# Patient Record
Sex: Female | Born: 1935 | Race: White | Hispanic: No | Smoking: Never smoker
Health system: Southern US, Community
[De-identification: ages and names within clinical notes are randomized; demographics above are authoritative.]

## PROBLEM LIST (undated history)

## (undated) DIAGNOSIS — M545 Low back pain, unspecified: Secondary | ICD-10-CM

## (undated) DIAGNOSIS — I2510 Atherosclerotic heart disease of native coronary artery without angina pectoris: Secondary | ICD-10-CM

## (undated) DIAGNOSIS — G43909 Migraine, unspecified, not intractable, without status migrainosus: Secondary | ICD-10-CM

## (undated) DIAGNOSIS — S72453K Displaced supracondylar fracture without intracondylar extension of lower end of unspecified femur, subsequent encounter for closed fracture with nonunion: Secondary | ICD-10-CM

## (undated) DIAGNOSIS — I6529 Occlusion and stenosis of unspecified carotid artery: Secondary | ICD-10-CM

## (undated) DIAGNOSIS — F32A Depression, unspecified: Secondary | ICD-10-CM

## (undated) DIAGNOSIS — F0390 Unspecified dementia without behavioral disturbance: Secondary | ICD-10-CM

## (undated) DIAGNOSIS — I4891 Unspecified atrial fibrillation: Secondary | ICD-10-CM

## (undated) DIAGNOSIS — I255 Ischemic cardiomyopathy: Secondary | ICD-10-CM

## (undated) DIAGNOSIS — G8929 Other chronic pain: Secondary | ICD-10-CM

## (undated) DIAGNOSIS — M069 Rheumatoid arthritis, unspecified: Secondary | ICD-10-CM

## (undated) DIAGNOSIS — F419 Anxiety disorder, unspecified: Secondary | ICD-10-CM

## (undated) DIAGNOSIS — I10 Essential (primary) hypertension: Secondary | ICD-10-CM

## (undated) DIAGNOSIS — D649 Anemia, unspecified: Secondary | ICD-10-CM

## (undated) DIAGNOSIS — K922 Gastrointestinal hemorrhage, unspecified: Secondary | ICD-10-CM

## (undated) DIAGNOSIS — F329 Major depressive disorder, single episode, unspecified: Secondary | ICD-10-CM

## (undated) HISTORY — DX: Ischemic cardiomyopathy: I25.5

## (undated) HISTORY — DX: Occlusion and stenosis of unspecified carotid artery: I65.29

## (undated) HISTORY — DX: Atherosclerotic heart disease of native coronary artery without angina pectoris: I25.10

## (undated) HISTORY — PX: BACK SURGERY: SHX140

## (undated) HISTORY — PX: BREAST LUMPECTOMY: SHX2

## (undated) HISTORY — PX: JOINT REPLACEMENT: SHX530

---

## 1997-09-29 ENCOUNTER — Ambulatory Visit (HOSPITAL_COMMUNITY): Admission: RE | Admit: 1997-09-29 | Discharge: 1997-09-29 | Payer: Self-pay | Admitting: Family Medicine

## 1997-10-14 ENCOUNTER — Ambulatory Visit (HOSPITAL_COMMUNITY): Admission: RE | Admit: 1997-10-14 | Discharge: 1997-10-14 | Payer: Self-pay | Admitting: Family Medicine

## 1998-01-28 ENCOUNTER — Ambulatory Visit (HOSPITAL_COMMUNITY): Admission: RE | Admit: 1998-01-28 | Discharge: 1998-01-28 | Payer: Self-pay | Admitting: Family Medicine

## 1998-04-01 ENCOUNTER — Ambulatory Visit (HOSPITAL_COMMUNITY): Admission: RE | Admit: 1998-04-01 | Discharge: 1998-04-01 | Payer: Self-pay | Admitting: Orthopedic Surgery

## 1998-10-13 ENCOUNTER — Ambulatory Visit (HOSPITAL_COMMUNITY): Admission: RE | Admit: 1998-10-13 | Discharge: 1998-10-13 | Payer: Self-pay | Admitting: Family Medicine

## 1998-10-13 ENCOUNTER — Encounter: Payer: Self-pay | Admitting: Family Medicine

## 1998-12-17 ENCOUNTER — Other Ambulatory Visit: Admission: RE | Admit: 1998-12-17 | Discharge: 1998-12-17 | Payer: Self-pay | Admitting: Family Medicine

## 1999-10-29 ENCOUNTER — Encounter: Payer: Self-pay | Admitting: Family Medicine

## 1999-10-29 ENCOUNTER — Ambulatory Visit (HOSPITAL_COMMUNITY): Admission: RE | Admit: 1999-10-29 | Discharge: 1999-10-29 | Payer: Self-pay | Admitting: Family Medicine

## 2000-04-25 ENCOUNTER — Other Ambulatory Visit: Admission: RE | Admit: 2000-04-25 | Discharge: 2000-04-25 | Payer: Self-pay | Admitting: Family Medicine

## 2000-08-03 ENCOUNTER — Encounter: Payer: Self-pay | Admitting: Orthopedic Surgery

## 2000-08-03 ENCOUNTER — Ambulatory Visit (HOSPITAL_COMMUNITY): Admission: RE | Admit: 2000-08-03 | Discharge: 2000-08-03 | Payer: Self-pay | Admitting: Orthopedic Surgery

## 2000-11-29 ENCOUNTER — Other Ambulatory Visit: Admission: RE | Admit: 2000-11-29 | Discharge: 2000-11-29 | Payer: Self-pay | Admitting: Obstetrics and Gynecology

## 2000-12-07 ENCOUNTER — Ambulatory Visit (HOSPITAL_COMMUNITY): Admission: RE | Admit: 2000-12-07 | Discharge: 2000-12-07 | Payer: Self-pay | Admitting: Family Medicine

## 2000-12-07 ENCOUNTER — Encounter: Payer: Self-pay | Admitting: Family Medicine

## 2000-12-08 ENCOUNTER — Ambulatory Visit (HOSPITAL_COMMUNITY): Admission: RE | Admit: 2000-12-08 | Discharge: 2000-12-08 | Payer: Self-pay | Admitting: Obstetrics and Gynecology

## 2000-12-08 ENCOUNTER — Encounter: Payer: Self-pay | Admitting: Obstetrics and Gynecology

## 2001-01-27 HISTORY — PX: KNEE ARTHROSCOPY: SHX127

## 2001-02-06 ENCOUNTER — Ambulatory Visit (HOSPITAL_COMMUNITY): Admission: RE | Admit: 2001-02-06 | Discharge: 2001-02-06 | Payer: Self-pay | Admitting: Orthopedic Surgery

## 2001-05-03 ENCOUNTER — Encounter: Admission: RE | Admit: 2001-05-03 | Discharge: 2001-05-03 | Payer: Self-pay | Admitting: Rheumatology

## 2001-05-03 ENCOUNTER — Encounter: Payer: Self-pay | Admitting: Rheumatology

## 2002-01-01 ENCOUNTER — Encounter: Payer: Self-pay | Admitting: Rheumatology

## 2002-01-01 ENCOUNTER — Encounter: Admission: RE | Admit: 2002-01-01 | Discharge: 2002-01-01 | Payer: Self-pay | Admitting: Rheumatology

## 2002-01-27 HISTORY — PX: TOTAL KNEE ARTHROPLASTY: SHX125

## 2002-02-18 ENCOUNTER — Inpatient Hospital Stay (HOSPITAL_COMMUNITY): Admission: RE | Admit: 2002-02-18 | Discharge: 2002-02-21 | Payer: Self-pay | Admitting: Orthopedic Surgery

## 2002-08-06 ENCOUNTER — Encounter: Admission: RE | Admit: 2002-08-06 | Discharge: 2002-08-06 | Payer: Self-pay | Admitting: Rheumatology

## 2002-08-06 ENCOUNTER — Encounter: Payer: Self-pay | Admitting: Rheumatology

## 2002-09-02 ENCOUNTER — Encounter: Admission: RE | Admit: 2002-09-02 | Discharge: 2002-09-02 | Payer: Self-pay | Admitting: Neurosurgery

## 2002-09-02 ENCOUNTER — Encounter: Payer: Self-pay | Admitting: Neurosurgery

## 2002-09-16 ENCOUNTER — Encounter: Payer: Self-pay | Admitting: Neurosurgery

## 2002-09-16 ENCOUNTER — Encounter: Admission: RE | Admit: 2002-09-16 | Discharge: 2002-09-16 | Payer: Self-pay | Admitting: Neurosurgery

## 2002-09-29 HISTORY — PX: LUMBAR DISC SURGERY: SHX700

## 2002-10-21 ENCOUNTER — Inpatient Hospital Stay (HOSPITAL_COMMUNITY): Admission: RE | Admit: 2002-10-21 | Discharge: 2002-11-21 | Payer: Self-pay | Admitting: Neurosurgery

## 2002-10-21 ENCOUNTER — Encounter: Payer: Self-pay | Admitting: Neurosurgery

## 2002-10-28 HISTORY — PX: INCISION AND DRAINAGE OF WOUND: SHX1803

## 2002-10-28 HISTORY — PX: HEMATOMA EVACUATION: SHX5118

## 2002-10-30 ENCOUNTER — Encounter: Payer: Self-pay | Admitting: Neurosurgery

## 2002-11-03 ENCOUNTER — Encounter: Payer: Self-pay | Admitting: Neurosurgery

## 2002-11-07 ENCOUNTER — Encounter: Payer: Self-pay | Admitting: Neurosurgery

## 2002-11-13 ENCOUNTER — Encounter: Payer: Self-pay | Admitting: Neurosurgery

## 2002-11-22 ENCOUNTER — Encounter (HOSPITAL_COMMUNITY): Admission: RE | Admit: 2002-11-22 | Discharge: 2002-11-24 | Payer: Self-pay | Admitting: Neurosurgery

## 2002-11-23 ENCOUNTER — Inpatient Hospital Stay (HOSPITAL_COMMUNITY): Admission: AD | Admit: 2002-11-23 | Discharge: 2002-12-03 | Payer: Self-pay | Admitting: Neurosurgery

## 2003-05-13 ENCOUNTER — Encounter: Payer: Self-pay | Admitting: Geriatric Medicine

## 2003-05-13 ENCOUNTER — Ambulatory Visit (HOSPITAL_COMMUNITY): Admission: RE | Admit: 2003-05-13 | Discharge: 2003-05-13 | Payer: Self-pay | Admitting: Geriatric Medicine

## 2003-10-28 HISTORY — PX: LUMBAR LAMINECTOMY: SHX95

## 2003-11-03 ENCOUNTER — Emergency Department (HOSPITAL_COMMUNITY): Admission: EM | Admit: 2003-11-03 | Discharge: 2003-11-03 | Payer: Self-pay | Admitting: Emergency Medicine

## 2003-11-25 ENCOUNTER — Inpatient Hospital Stay (HOSPITAL_COMMUNITY): Admission: EM | Admit: 2003-11-25 | Discharge: 2003-12-05 | Payer: Self-pay | Admitting: Emergency Medicine

## 2003-12-02 ENCOUNTER — Encounter: Payer: Self-pay | Admitting: Neurosurgery

## 2003-12-05 ENCOUNTER — Inpatient Hospital Stay (HOSPITAL_COMMUNITY)
Admission: RE | Admit: 2003-12-05 | Discharge: 2003-12-18 | Payer: Self-pay | Admitting: Physical Medicine & Rehabilitation

## 2004-01-04 ENCOUNTER — Emergency Department (HOSPITAL_COMMUNITY): Admission: EM | Admit: 2004-01-04 | Discharge: 2004-01-04 | Payer: Self-pay | Admitting: Emergency Medicine

## 2004-01-08 ENCOUNTER — Ambulatory Visit (HOSPITAL_COMMUNITY): Admission: RE | Admit: 2004-01-08 | Discharge: 2004-01-08 | Payer: Self-pay | Admitting: Geriatric Medicine

## 2004-01-08 ENCOUNTER — Emergency Department (HOSPITAL_COMMUNITY): Admission: EM | Admit: 2004-01-08 | Discharge: 2004-01-08 | Payer: Self-pay | Admitting: Emergency Medicine

## 2004-01-21 ENCOUNTER — Encounter (HOSPITAL_COMMUNITY): Admission: RE | Admit: 2004-01-21 | Discharge: 2004-02-01 | Payer: Self-pay | Admitting: Geriatric Medicine

## 2004-01-26 ENCOUNTER — Encounter: Admission: RE | Admit: 2004-01-26 | Discharge: 2004-01-26 | Payer: Self-pay | Admitting: Infectious Diseases

## 2004-02-16 ENCOUNTER — Encounter: Admission: RE | Admit: 2004-02-16 | Discharge: 2004-02-16 | Payer: Self-pay | Admitting: Infectious Diseases

## 2004-05-31 ENCOUNTER — Ambulatory Visit: Payer: Self-pay | Admitting: Infectious Diseases

## 2004-09-28 ENCOUNTER — Encounter: Admission: RE | Admit: 2004-09-28 | Discharge: 2004-09-28 | Payer: Self-pay | Admitting: Rheumatology

## 2004-11-15 ENCOUNTER — Other Ambulatory Visit: Admission: RE | Admit: 2004-11-15 | Discharge: 2004-11-15 | Payer: Self-pay | Admitting: Geriatric Medicine

## 2004-12-02 ENCOUNTER — Ambulatory Visit (HOSPITAL_COMMUNITY): Admission: RE | Admit: 2004-12-02 | Discharge: 2004-12-02 | Payer: Self-pay | Admitting: Geriatric Medicine

## 2005-01-03 ENCOUNTER — Ambulatory Visit: Payer: Self-pay | Admitting: Infectious Diseases

## 2005-08-26 ENCOUNTER — Ambulatory Visit (HOSPITAL_COMMUNITY): Admission: RE | Admit: 2005-08-26 | Discharge: 2005-08-26 | Payer: Self-pay | Admitting: Emergency Medicine

## 2005-12-27 ENCOUNTER — Ambulatory Visit (HOSPITAL_COMMUNITY): Admission: RE | Admit: 2005-12-27 | Discharge: 2005-12-27 | Payer: Self-pay | Admitting: Geriatric Medicine

## 2007-12-17 ENCOUNTER — Encounter: Admission: RE | Admit: 2007-12-17 | Discharge: 2007-12-17 | Payer: Self-pay | Admitting: Geriatric Medicine

## 2008-01-01 ENCOUNTER — Ambulatory Visit (HOSPITAL_COMMUNITY): Admission: RE | Admit: 2008-01-01 | Discharge: 2008-01-01 | Payer: Self-pay | Admitting: Geriatric Medicine

## 2008-04-25 ENCOUNTER — Emergency Department (HOSPITAL_COMMUNITY): Admission: EM | Admit: 2008-04-25 | Discharge: 2008-04-25 | Payer: Self-pay | Admitting: Emergency Medicine

## 2008-10-02 ENCOUNTER — Encounter: Admission: RE | Admit: 2008-10-02 | Discharge: 2008-10-02 | Payer: Self-pay | Admitting: Geriatric Medicine

## 2009-01-02 ENCOUNTER — Ambulatory Visit (HOSPITAL_COMMUNITY): Admission: RE | Admit: 2009-01-02 | Discharge: 2009-01-02 | Payer: Self-pay | Admitting: Urology

## 2009-05-06 ENCOUNTER — Other Ambulatory Visit: Admission: RE | Admit: 2009-05-06 | Discharge: 2009-05-06 | Payer: Self-pay | Admitting: Geriatric Medicine

## 2010-01-19 ENCOUNTER — Ambulatory Visit (HOSPITAL_COMMUNITY): Admission: RE | Admit: 2010-01-19 | Discharge: 2010-01-19 | Payer: Self-pay | Admitting: Geriatric Medicine

## 2010-11-23 ENCOUNTER — Emergency Department (HOSPITAL_COMMUNITY): Payer: Medicare Other

## 2010-11-23 ENCOUNTER — Emergency Department (HOSPITAL_COMMUNITY)
Admission: EM | Admit: 2010-11-23 | Discharge: 2010-11-24 | Disposition: A | Discharge status: Home / Self Care | Payer: Medicare Other | Attending: Emergency Medicine | Admitting: Emergency Medicine

## 2010-11-23 DIAGNOSIS — F3289 Other specified depressive episodes: Secondary | ICD-10-CM | POA: Insufficient documentation

## 2010-11-23 DIAGNOSIS — F329 Major depressive disorder, single episode, unspecified: Secondary | ICD-10-CM | POA: Insufficient documentation

## 2010-11-23 DIAGNOSIS — S1093XA Contusion of unspecified part of neck, initial encounter: Secondary | ICD-10-CM | POA: Insufficient documentation

## 2010-11-23 DIAGNOSIS — S0003XA Contusion of scalp, initial encounter: Secondary | ICD-10-CM | POA: Insufficient documentation

## 2010-11-23 DIAGNOSIS — X58XXXA Exposure to other specified factors, initial encounter: Secondary | ICD-10-CM | POA: Insufficient documentation

## 2010-11-23 DIAGNOSIS — F1010 Alcohol abuse, uncomplicated: Secondary | ICD-10-CM | POA: Insufficient documentation

## 2010-11-23 DIAGNOSIS — I10 Essential (primary) hypertension: Secondary | ICD-10-CM | POA: Insufficient documentation

## 2010-11-23 LAB — DIFFERENTIAL
Basophils Absolute: 0.1 10*3/uL (ref 0.0–0.1)
Eosinophils Relative: 4 % (ref 0–5)
Lymphocytes Relative: 26 % (ref 12–46)
Lymphs Abs: 1 10*3/uL (ref 0.7–4.0)
Monocytes Absolute: 0.6 10*3/uL (ref 0.1–1.0)
Neutro Abs: 2 10*3/uL (ref 1.7–7.7)

## 2010-11-23 LAB — COMPREHENSIVE METABOLIC PANEL
ALT: 29 U/L (ref 0–35)
AST: 62 U/L — ABNORMAL HIGH (ref 0–37)
Alkaline Phosphatase: 83 U/L (ref 39–117)
CO2: 27 mEq/L (ref 19–32)
Calcium: 8.7 mg/dL (ref 8.4–10.5)
Chloride: 100 mEq/L (ref 96–112)
GFR calc Af Amer: >60 mL/min (ref >60)
GFR calc non Af Amer: >60 mL/min (ref >60)
Glucose, Bld: 106 mg/dL — ABNORMAL HIGH (ref 70–99)
Potassium: 4.2 mEq/L (ref 3.5–5.1)
Sodium: 142 mEq/L (ref 135–145)
Total Bilirubin: 0.8 mg/dL (ref 0.3–1.2)

## 2010-11-23 LAB — CBC
HCT: 42.2 % (ref 36.0–46.0)
Hemoglobin: 14 g/dL (ref 12.0–15.0)
MCV: 93.8 fL (ref 78.0–100.0)
RDW: 17.5 % — ABNORMAL HIGH (ref 11.5–15.5)
WBC: 3.8 10*3/uL — ABNORMAL LOW (ref 4.0–10.5)

## 2010-11-23 LAB — URINALYSIS, ROUTINE W REFLEX MICROSCOPIC
Glucose, UA: NEGATIVE mg/dL
Leukocytes, UA: NEGATIVE
Protein, ur: NEGATIVE mg/dL
Specific Gravity, Urine: 1.01 (ref 1.005–1.030)
pH: 5.5 (ref 5.0–8.0)

## 2010-11-23 LAB — RAPID URINE DRUG SCREEN, HOSP PERFORMED
Barbiturates: NOT DETECTED
Benzodiazepines: NOT DETECTED

## 2010-11-23 LAB — URINE MICROSCOPIC-ADD ON

## 2010-11-26 ENCOUNTER — Emergency Department (HOSPITAL_COMMUNITY)
Admission: EM | Admit: 2010-11-26 | Discharge: 2010-11-27 | Disposition: A | Discharge status: Home / Self Care | Payer: Medicare Other | Attending: Emergency Medicine | Admitting: Emergency Medicine

## 2010-11-26 DIAGNOSIS — F329 Major depressive disorder, single episode, unspecified: Secondary | ICD-10-CM | POA: Insufficient documentation

## 2010-11-26 DIAGNOSIS — R4182 Altered mental status, unspecified: Secondary | ICD-10-CM | POA: Insufficient documentation

## 2010-11-26 DIAGNOSIS — F3289 Other specified depressive episodes: Secondary | ICD-10-CM | POA: Insufficient documentation

## 2010-11-26 LAB — DIFFERENTIAL
Basophils Absolute: 0 10*3/uL (ref 0.0–0.1)
Basophils Relative: 1 % (ref 0–1)
Lymphocytes Relative: 26 % (ref 12–46)
Monocytes Relative: 15 % — ABNORMAL HIGH (ref 3–12)
Neutro Abs: 1.8 10*3/uL (ref 1.7–7.7)
Neutrophils Relative %: 49 % (ref 43–77)

## 2010-11-26 LAB — RAPID URINE DRUG SCREEN, HOSP PERFORMED
Barbiturates: NOT DETECTED
Benzodiazepines: POSITIVE — AB
Cocaine: NOT DETECTED

## 2010-11-26 LAB — URINALYSIS, ROUTINE W REFLEX MICROSCOPIC
Glucose, UA: NEGATIVE mg/dL
Ketones, ur: NEGATIVE mg/dL
Leukocytes, UA: NEGATIVE
pH: 6 (ref 5.0–8.0)

## 2010-11-26 LAB — ETHANOL: Alcohol, Ethyl (B): 251 mg/dL — ABNORMAL HIGH (ref 0–10)

## 2010-11-26 LAB — COMPREHENSIVE METABOLIC PANEL
ALT: 23 U/L (ref 0–35)
AST: 34 U/L (ref 0–37)
Albumin: 3.1 g/dL — ABNORMAL LOW (ref 3.5–5.2)
Alkaline Phosphatase: 73 U/L (ref 39–117)
Chloride: 96 mEq/L (ref 96–112)
GFR calc Af Amer: >60 mL/min (ref >60)
Potassium: 3.5 mEq/L (ref 3.5–5.1)
Total Bilirubin: 0.7 mg/dL (ref 0.3–1.2)

## 2010-11-26 LAB — CBC
HCT: 38.6 % (ref 36.0–46.0)
Hemoglobin: 12.7 g/dL (ref 12.0–15.0)
RBC: 4.06 MIL/uL (ref 3.87–5.11)

## 2010-11-26 LAB — URINE MICROSCOPIC-ADD ON

## 2010-12-13 ENCOUNTER — Other Ambulatory Visit: Payer: Self-pay | Admitting: Geriatric Medicine

## 2010-12-13 ENCOUNTER — Other Ambulatory Visit (HOSPITAL_COMMUNITY): Payer: Self-pay | Admitting: Geriatric Medicine

## 2010-12-13 DIAGNOSIS — Z1231 Encounter for screening mammogram for malignant neoplasm of breast: Secondary | ICD-10-CM

## 2011-01-10 ENCOUNTER — Emergency Department (HOSPITAL_COMMUNITY): Payer: No Typology Code available for payment source

## 2011-01-10 ENCOUNTER — Inpatient Hospital Stay (HOSPITAL_COMMUNITY)
Admission: EM | Admit: 2011-01-10 | Discharge: 2011-01-12 | DRG: 101 | Disposition: A | Discharge status: Home / Self Care | Payer: No Typology Code available for payment source | Attending: Internal Medicine | Admitting: Internal Medicine

## 2011-01-10 DIAGNOSIS — E876 Hypokalemia: Secondary | ICD-10-CM | POA: Diagnosis present

## 2011-01-10 DIAGNOSIS — T502X5A Adverse effect of carbonic-anhydrase inhibitors, benzothiadiazides and other diuretics, initial encounter: Secondary | ICD-10-CM | POA: Diagnosis present

## 2011-01-10 DIAGNOSIS — Z79899 Other long term (current) drug therapy: Secondary | ICD-10-CM

## 2011-01-10 DIAGNOSIS — G8929 Other chronic pain: Secondary | ICD-10-CM | POA: Diagnosis present

## 2011-01-10 DIAGNOSIS — F319 Bipolar disorder, unspecified: Secondary | ICD-10-CM | POA: Diagnosis present

## 2011-01-10 DIAGNOSIS — M069 Rheumatoid arthritis, unspecified: Secondary | ICD-10-CM | POA: Diagnosis present

## 2011-01-10 DIAGNOSIS — Z96659 Presence of unspecified artificial knee joint: Secondary | ICD-10-CM

## 2011-01-10 DIAGNOSIS — R413 Other amnesia: Secondary | ICD-10-CM | POA: Diagnosis present

## 2011-01-10 DIAGNOSIS — H409 Unspecified glaucoma: Secondary | ICD-10-CM | POA: Diagnosis present

## 2011-01-10 DIAGNOSIS — I10 Essential (primary) hypertension: Secondary | ICD-10-CM | POA: Diagnosis present

## 2011-01-10 DIAGNOSIS — R569 Unspecified convulsions: Principal | ICD-10-CM | POA: Diagnosis present

## 2011-01-10 DIAGNOSIS — M545 Low back pain, unspecified: Secondary | ICD-10-CM | POA: Diagnosis present

## 2011-01-10 LAB — CBC
HCT: 38.3 % (ref 36.0–46.0)
MCHC: 33.9 g/dL (ref 30.0–36.0)
RDW: 14.6 % (ref 11.5–15.5)

## 2011-01-10 LAB — TROPONIN I: Troponin I: <0.3 ng/mL (ref <0.30)

## 2011-01-10 LAB — DIFFERENTIAL
Basophils Absolute: 0 10*3/uL (ref 0.0–0.1)
Basophils Relative: 1 % (ref 0–1)
Eosinophils Absolute: 0.1 10*3/uL (ref 0.0–0.7)
Eosinophils Relative: 2 % (ref 0–5)
Lymphocytes Relative: 12 % (ref 12–46)
Monocytes Absolute: 1.1 10*3/uL — ABNORMAL HIGH (ref 0.1–1.0)

## 2011-01-10 LAB — COMPREHENSIVE METABOLIC PANEL
ALT: 19 U/L (ref 0–35)
Calcium: 9.4 mg/dL (ref 8.4–10.5)
Glucose, Bld: 146 mg/dL — ABNORMAL HIGH (ref 70–99)
Sodium: 129 meq/L — ABNORMAL LOW (ref 135–145)
Total Protein: 7.9 g/dL (ref 6.0–8.3)

## 2011-01-10 LAB — URINALYSIS, ROUTINE W REFLEX MICROSCOPIC
Bilirubin Urine: NEGATIVE
Glucose, UA: NEGATIVE mg/dL
Ketones, ur: 15 mg/dL — AB
pH: 7.5 (ref 5.0–8.0)

## 2011-01-10 LAB — POCT CARDIAC MARKERS
Myoglobin, poc: 291 ng/mL (ref 12–200)
Troponin i, poc: <0.05 ng/mL (ref 0.00–0.09)
Troponin i, poc: <0.05 ng/mL (ref 0.00–0.09)

## 2011-01-10 LAB — URINE MICROSCOPIC-ADD ON

## 2011-01-10 LAB — CK TOTAL AND CKMB (NOT AT ARMC)
Relative Index: 3.3 — ABNORMAL HIGH (ref 0.0–2.5)
Total CK: 123 U/L (ref 7–177)

## 2011-01-11 ENCOUNTER — Observation Stay (HOSPITAL_COMMUNITY): Payer: No Typology Code available for payment source

## 2011-01-11 LAB — CBC
HCT: 30.5 % — ABNORMAL LOW (ref 36.0–46.0)
Hemoglobin: 10.3 g/dL — ABNORMAL LOW (ref 12.0–15.0)
MCHC: 33.8 g/dL (ref 30.0–36.0)
RBC: 3.28 MIL/uL — ABNORMAL LOW (ref 3.87–5.11)
WBC: 7.3 10*3/uL (ref 4.0–10.5)

## 2011-01-11 LAB — COMPREHENSIVE METABOLIC PANEL
ALT: 14 U/L (ref 0–35)
AST: 23 U/L (ref 0–37)
Alkaline Phosphatase: 98 U/L (ref 39–117)
CO2: 28 mEq/L (ref 19–32)
Calcium: 8 mg/dL — ABNORMAL LOW (ref 8.4–10.5)
Chloride: 97 mEq/L (ref 96–112)
GFR calc Af Amer: >60 mL/min (ref >60)
GFR calc non Af Amer: >60 mL/min (ref >60)
Glucose, Bld: 115 mg/dL — ABNORMAL HIGH (ref 70–99)
Potassium: 3.2 mEq/L — ABNORMAL LOW (ref 3.5–5.1)
Sodium: 131 mEq/L — ABNORMAL LOW (ref 135–145)

## 2011-01-11 LAB — CARDIAC PANEL(CRET KIN+CKTOT+MB+TROPI)
CK, MB: 2.7 ng/mL (ref 0.3–4.0)
Relative Index: 2.8 — ABNORMAL HIGH (ref 0.0–2.5)
Relative Index: 2.3 (ref 0.0–2.5)
Total CK: 116 U/L (ref 7–177)
Troponin I: <0.3 ng/mL (ref <0.30)

## 2011-01-11 LAB — MRSA PCR SCREENING: MRSA by PCR: NEGATIVE

## 2011-01-11 LAB — RPR: RPR Ser Ql: NONREACTIVE

## 2011-01-11 LAB — VITAMIN B12: Vitamin B-12: 683 pg/mL (ref 211–911)

## 2011-01-11 LAB — CREATININE, SERUM: Creatinine, Ser: 0.64 mg/dL (ref 0.4–1.2)

## 2011-01-11 LAB — MAGNESIUM: Magnesium: 2.3 mg/dL (ref 1.5–2.5)
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
## 2011-01-12 LAB — BASIC METABOLIC PANEL
CO2: 21 mEq/L (ref 19–32)
Chloride: 105 mEq/L (ref 96–112)
GFR calc Af Amer: >60 mL/min (ref >60)
Glucose, Bld: 81 mg/dL (ref 70–99)
Sodium: 138 mEq/L (ref 135–145)

## 2011-01-12 LAB — CBC
HCT: 33.9 % — ABNORMAL LOW (ref 36.0–46.0)
Hemoglobin: 11 g/dL — ABNORMAL LOW (ref 12.0–15.0)
RBC: 3.54 MIL/uL — ABNORMAL LOW (ref 3.87–5.11)

## 2011-01-12 LAB — FOLATE RBC: RBC Folate: 1598 ng/mL — ABNORMAL HIGH (ref >=366)

## 2011-01-12 NOTE — Procedures (Signed)
EEG NUMBER:  REQUESTING PHYSICIAN:  Eduard Clos, MD  HISTORY:  An 77 year old female with new-onset seizure.  MEDICATIONS:  Norvasc, Lioresal, potassium, Normodyne, Ativan, Zofran.  CONDITIONS OF RECORDING:  This is a 16-channel EEG carried out patient in the awake, drowsy, and asleep states.  DESCRIPTION:  The waking background activity consists of a low-voltage symmetrical fairly well-organized 11 Hz alpha activity seen from the parieto-occipital and posterotemporal regions.  Low-voltage fast activity poorly organized was seen anteriorly at times superimposed on more posterior rhythms.  A mixture of theta and alpha rhythms was seen from these central and temporal regions.  The patient drowses with slowing to irregular low voltage theta and beta activity.  The patient goes into a light sleep with symmetrical sleep spindles everted and irregular slow activity.  Hypoventilation was not performed. Intermittent photic stimulation failed to elicit any change in the tracing.  IMPRESSION:  This is a normal EEG.          ______________________________ Thana Farr, MD    [id number] D:  01/11/2011 17:53:30  T:  01/12/2011 02:02:33  Job #:  [id number]
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
## 2011-01-14 NOTE — Op Note (Signed)
   NAME:  Connie Wagner, Connie Wagner                         ACCOUNT NO.:  [id number]   MEDICAL RECORD NO.:  [id number]                   PATIENT TYPE:  INP   LOCATION:  3039                                 FACILITY:  [facility]   PHYSICIAN:  Coletta Memos, M.D.                  DATE OF BIRTH:  08/17/1929   DATE OF PROCEDURE:  11/01/2002  DATE OF DISCHARGE:                                 OPERATIVE REPORT   PREOPERATIVE DIAGNOSIS:  Lumbar hematoma.   POSTOPERATIVE DIAGNOSIS:  Lumbar hematoma.   PROCEDURE:  Lumbar hematoma evacuation.   COMPLICATIONS:  None.   SURGEON:  Coletta Memos, M.D.   ANESTHESIA:  General endotracheal.   INDICATIONS:  The patient is a 77 year old woman who underwent an operation  on 10/21/02.  Postoperatively she had continued pain in the right lower  extremity though her back and left lower extremity pain was improved.  An  MRI revealed a lumbar hematoma and although not overly impressive, I felt  that given the pain and the questionable contribution from the hematoma that  it be evacuated.   DESCRIPTION OF PROCEDURE:  The patient was brought to the operating room,  intubated, and placed under general anesthesia without difficulty.  She had  her back prepped, and she was draped in a sterile fashion.  I opened the  wound with a #10 blade.  I cut the sutures which were holding the wound in  place.  I then exposed the hematoma.  I used copious amounts of warm  irrigation and removed the hematoma.  I inspected the laminectomy site,  which looked fine.  There was no CSF encountered.  I also inspected the  diskectomy site, and that also was clean.  I made sure that there was no  pressure on the thecal sac and nerve root at closure.  I irrigated again.  I  then closed the wound in a layered fashion.  Dermabond was used for a  sterile dressing.  The patient tolerated the procedure well without  difficulty.                                               Coletta Memos, M.D.    KC/MEDQ  D:  11/01/2002  T:  11/02/2002  Job:  [id number]

## 2011-01-14 NOTE — Discharge Summary (Signed)
[place]  Patient:    Connie Wagner, MANISCALCO Visit Number: [id number] MRN: [id number]          Service Type: SUR Location: 5000 [id number] [id number] Attending Physician:  Twana First Dictated by:   Julien Girt, P.A.-C Admit Date:  02/18/2002 Discharge Date: 02/21/2002                             Discharge Summary  ADMISSION DIAGNOSES: 1. End stage degenerative joint disease, right knee. 2. History of rheumatoid arthritis. 3. History of depression.  DISCHARGE DIAGNOSES: 1. End stage degenerative joint disease, right knee. 2. Rheumatoid arthritis. 3. Depression. 4. Postoperative blood loss anemia. 5. Hypokalemia.  HISTORY OF PRESENT ILLNESS:  The patient is a 77 year old female with a history of rheumatoid arthritis for many years.  She has had right knee pain for the past year.  Tried rheumatoid medications, cortisone shots, and debriding arthroscopy, all without relief.  At this point in time, she has pain during the day, pain at rest.  PROCEDURES:  On 02/18/02, the patient underwent a right total knee by Dr. Thurston Hole.  HOSPITAL COURSE:  She was admitted postoperatively for deep venous thrombosis prophylaxis, pain control, and physical therapy.  On postoperative day #1, the patient had trouble sleeping.  Temperature max was 99.9.  Hemoglobin was 7.1, potassium was 4.2.  She received 2 units of packed red blood cells with 10 mg of Lasix IV between units.  Her drain was discontinued.  Her PCA was discontinued.  On postoperative day #2, the patient was feeling much better. Hemoglobin was 10.1.  Range of motion was -10 to 70.  Surgical wound was well approximated.  Potassium was 3.3.  She was given 20 mEq of potassium p.o. today.  On postoperative day #3, the patient is doing well, her pain is well controlled.  She would like to go home.  Her temperature is 100.8.  She is ambulating 75 feet with supervision.  She is doing stairs with the  rail assist.  Her range of motion is 0 to 75 degrees.  Hemoglobin is 9.9.  She is discharged to home in stable condition.  DISCHARGE MEDICATIONS: 1. Percocet 5/325 one or two q.4-6h. p.r.n. pain. 2. Coumadin 2.5 mg one tablet q.d. with evening meal. 3. Colace 100 mg one tablet b.i.d. 4. Senokot S two tablets q.h.s.  ACTIVITY:  Weightbearing as tolerated with the use of a rolling walker.  DIET:  Regular.  WOUND CARE:  She has been instructed to keep her wound clean and dry.  Do daily dressing changes.  Genevieve Norlander will follow her Coumadin, and home health physical therapy.  FOLLOWUP:  Dr. Thurston Hole on March 05, 2002.  DISCHARGE INSTRUCTIONS:  She will call if she has temperature greater than 101, increased drainage, or increased pain. Dictated by:   Julien Girt, P.A.-C Attending Physician:  Twana First DD:  03/11/02 TD:  03/13/02 Job: [id number]

## 2011-01-14 NOTE — Discharge Summary (Signed)
NAME:  Connie Wagner, Connie Wagner                         ACCOUNT NO.:  [id number]   MEDICAL RECORD NO.:  [id number]                   PATIENT TYPE:  INP   LOCATION:  3039                                 FACILITY:  [facility]   PHYSICIAN:  Coletta Memos, M.D.                  DATE OF BIRTH:  09/21/1928   DATE OF ADMISSION:  10/21/2002  DATE OF DISCHARGE:  11/21/2002                                 DISCHARGE SUMMARY   ADMISSION DIAGNOSES:  1. Lumbar stenosis.  2. Lumbar radiculopathy.  3. Lumbar disk L5-S1 right spondylosis.   DISCHARGE DIAGNOSES:  1. Lumbar stenosis.  2. Lumbar radiculopathy.  3. Lumbar herniated disk.  4. Staphylococcus aureus bacteremia and wound infection with superinfection     with E. coli.   INDICATIONS:  The patient is a 77 year old who initially presented with  severe pain in the lower extremities bilaterally.  She had profound stenosis  at L3-4 and at L4-5 and a herniated disk at L5-S1 on the right side.  After  failure of conservative treatment, I recommended that she undergo operative  decompression with a diskectomy, and she agreed.  The patient was admitted  and taken to the operating room on 10/21/02 and underwent an uncomplicated  procedure.  Postoperatively, the pain was slightly improved in the left  lower extremity, but she had severe pain and spasm in the right lower  extremity, especially in the right foot.  She was seen by physical therapy  and moved around and seemed to improve, but never enough that I felt that  she was able to go home.  I therefore repeated the MRI scan and it showed  that she had a postoperative hematoma, although, not surprising to have a  hematoma after an operation.  It was not clear that this was not the cause  of her pain.  That being the case and her severe pain in the right lower  extremity, I elected to take her back to the operating room 10/31/02 for  removal of the hematoma.  On 3/5, she was doing quite well, and I had made  plans for her to be discharged on 3/6.  At that time, my partner Barnett Abu examined her and noticed that there was some redness and tenderness  around the incision and that she also was febrile.  At that point in time,  blood cultures were ordered, and they came back positive for staphylococcus  aureus in her blood.  When I cam back to see her that Monday, her wound was  obviously infected, extremely tender, and there was a significant foul  smelling, purulent appearing discharge.  I, therefore, took her to the  operating room that night, 3/8, and at that time opened her wound to find  nothing but purulent drainage.  I, therefore, irrigated copiously and then  tacked her wound open.  Postoperative cultures showed not only  staphylococcus  aureus which had been identified in her blood but also Gram  negative which was identified later as E. coli.  She had had a documented  urinary tract infection from that weekend when she first became febrile, and  had been incontinent over the weekend.  I believe that this is a  superinfection secondary to her laying in her own urine while in the bed  secondary to her incontinence.  She was given both Vancomycin and  Ceftriaxone and slowly but surely started to defervesce.  She eventually did  well and received, while in the hospital, Vancomycin until discharge, and as  also on Ceftriaxone.  She was planned for a 28 day treatment of Vancomycin.  She had repeat blood cultures secondary to one febrile episode later on, and  a repeat urine culture, both of which were clean.  Her pain significantly  improved after the third operation of cleaning out her wound infection.  At  the time of discharge, the wound bed is clean, pink and healthy in  appearance.  She has been afebrile.  Her gait is markedly improved.  She is  not having the pain that she was preoperatively before she ever came into  the hospital.  She will be discharged to home.  She will return   daily to [facility] for treatment with Vancomycin.  Her wound will be  packed and changed there also.  I also instructed the patient's husband in  proper wound care.  She was given prescriptions for Vicodin for pain, Ambien  for sleep and handicap parking sticker.  She has an appointment to see me in  approximately three weeks.                                               Coletta Memos, M.D.    KC/MEDQ  D:  11/21/2002  T:  11/22/2002  Job:  [id number]

## 2011-01-14 NOTE — Discharge Summary (Signed)
   NAME:  Connie Wagner, Connie Wagner                         ACCOUNT NO.:  [id number]   MEDICAL RECORD NO.:  [id number]                   PATIENT TYPE:  INP   LOCATION:  3019                                 FACILITY:  [facility]   PHYSICIAN:  Coletta Memos, M.D.                  DATE OF BIRTH:  1929-02-15   DATE OF ADMISSION:  11/23/2002  DATE OF DISCHARGE:  12/03/2002                                 DISCHARGE SUMMARY   REASON FOR ADMISSION:  Failure to thrive.   INDICATION:  The patient is a patient of mine, 77 years of age, who has had  a very long hospitalization and was discharged home in late March, actually  November 21, 2002.  She came back to the hospital on November 23, 2002 because it  seemed as if her family was unable to accurately gauge her care when she was  in the hospital and felt that they could not longer care for her at home.  She had fallen twice and her husband, who was recently diagnosed with  leukemia, was unable to get her off of the ground.  They therefore presented  to the hospital for their regularly scheduled IV therapy and consulted my  partner, Dr. Clydene Fake, and asked that he admit her secondary to their  inability to care for her at home.  Based on that prospect and lack of  social work options on the weekend, the patient was readmitted for IV  therapy; this was cleared with Medicare.   HOSPITAL COURSE:  She has had no problems since admission.  She developed  some yeast around her incision which is being treated with an antifungal.  She is doing well otherwise.   FOLLOWUP:  I will see her back in the office about a month after she goes  home.                                               Coletta Memos, M.D.    KC/MEDQ  D:  12/02/2002  T:  12/03/2002  Job:  [id number]

## 2011-01-14 NOTE — Op Note (Signed)
[place]  Patient:    Connie Wagner, Connie Wagner Visit Number: [id number] MRN: [id number]          Service Type: SUR Location: [id number] Attending Physician:  Twana First Dictated by:   Elana Alm Thurston Hole, M.D. Proc. Date: 02/18/02 Admit Date:  02/18/2002                             Operative Report  PREOPERATIVE DIAGNOSIS: Right knee degenerative joint disease.  POSTOPERATIVE DIAGNOSIS: Right knee degenerative joint disease.  OPERATION/PROCEDURE: Right total knee replacement using [employer] Scorpio total knee system with #5 comminuted femoral component, #7 comminuted tibial component with 12 mm polyethylene spacer and 26 mm polyethylene comminuted patella.  SURGEON: Elana Alm. Thurston Hole, M.D.  ASSISTANT: Julien Girt, P.A.  ANESTHESIA: General.  OPERATIVE TIME: One hour and 20 minutes.  COMPLICATIONS: None.  DESCRIPTION OF PROCEDURE: The patient was brought to the operating room on February 18, 2002 and placed on the operative table in the supine position.  After an adequate level of general anesthesia was obtained her right knee was examined under anesthesia.  Range of motion was from -5 to 125 degrees. Moderate varus deformity, knee stable ligamentous examination.  She had a Foley catheter placed under sterile conditions and received Ancef 1 g IV preoperatively for prophylaxis.  Initially the right leg was prepped using sterile DuraPrep and draped using sterile technique.  The leg was exsanguinated and a thigh tourniquet elevated to 375 mm Hg.  Initially through a 20 cm longitudinal incision based over the patella initial exposure was made.  The underlying subcutaneous tissues were incised in line with the skin incision.  A median arthrotomy was performed revealing an excessive amount of normal appearing joint fluid.  The articular surfaces were inspected.  She had grade IV changes medially and grade III changes laterally, grade III  and IV changes at the patellofemoral joint.  The medial and lateral meniscal remnants were removed as well as the osteophytes off the femoral condyles and tibial plateau.  An intramedullary drill was drilled up the femoral canal for placement of the distal femoral cutting jig, which was placed in the appropriate amount of rotation, and the distal femoral 10 mm cut was made. The distal femur was incised.  A #5 was found to be the appropriate size and a #5 cutting jig was placed and then these cuts were made.  The proximal tibia was then exposed.  Tibial spines were removed with an oscillating saw.  The intramedullary drill drilled down the tibial canal for placement of the proximal tibial cutting jig, which was placed in the appropriate amount of rotation and the proximal 6 mm cut was made.  After this was done the Scorpio PCL cutter was placed back on the distal femur and these cuts were made.  At this point then the #5 femoral trial was placed with an excellent fit.  The #7 tibial base plate trial was placed with a 12 mm polyethylene spacer and this was found to give excellent stability, excellent correction of her varus deformity, range of motion 0-125 degrees with no lift-off on the tray.  The tibial tray was then marked for rotation and the Keel cut was made.  The patella was incised.  A 26 mm was found to be the appropriate size and a recessed 10 mm x 26 mm cut was made and three locking holes were placed. After this was  done it was felt that all the trial components were of excellent size, fit, and stability.  They were then removed.  The knee was jet lavage irrigated with 3 liters of saline solution.  The proximal tibia was then exposed.  The #7 tibial tray with cement backing was hammered into position with an excellent fit, with excess cement being removed from around the edges.  The #5 femoral component with cement backing was hammered into position, also with an excellent fit,  with excess cement being removed from around the edges.  The 12 mm polyethylene spacer was locked on the tibial base plate and the knee taken throughout range of motion, 0-120 degrees, with excellent stability and no lift-off on the tray.  The patella button, 26 mm with cement backing, was then locked into its recessed hole and clamped. After the cement hardened patellofemoral tracking was evaluated.  This was found to be normal, with no need for lateral release.  At this point it was felt that all the components were of excellent size, fit, and stability.  The knee was further irrigated with antibiotic solution.  The arthrotomy was closed with #1 Ethibond suture over two medium Hemovac drains.  Subcutaneous tissue was closed with 0 and 2-0 Vicryl and the skin closed with skin staples. Sterile dressings were applied.  The tourniquet was released.  The Hemovac was injected with 0.25% Marcaine with epinephrine and clamped.  The patient then had a femoral nerve block placed by anesthesia for postoperative pain control. She was then awakened and taken to recovery in stable condition.  Needle and sponge counts were correct x2 at the end of the case.  FOLLOW-UP CARE: The patient will be followed as an inpatient.  She will be discharged when ambulatory and stable. Dictated by:   Elana Alm Thurston Hole, M.D. Attending Physician:  Twana First DD:  02/18/02 TD:  02/19/02 Job: [id number]

## 2011-01-14 NOTE — Op Note (Signed)
NAME:  Connie Wagner, Connie Wagner                         ACCOUNT NO.:  [id number]   MEDICAL RECORD NO.:  [id number]                   PATIENT TYPE:  INP   LOCATION:  3039                                 FACILITY:  [facility]   PHYSICIAN:  Coletta Memos, M.D.                  DATE OF BIRTH:  1929-01-25   DATE OF PROCEDURE:  DATE OF DISCHARGE:                                 OPERATIVE REPORT   PREOPERATIVE DIAGNOSIS:  Wound infection, lumbar.   POSTOPERATIVE DIAGNOSIS:  Wound infection, lumbar.   OPERATION PERFORMED:  Lumbar wound incision and drainage.   SURGEON:  Coletta Memos, M.D.   ANESTHESIA:  General endotracheal.   COMPLICATIONS:  None.   INDICATIONS FOR PROCEDURE:  The patient is a 77 years old and was admitted  on October 21, 2002 where she had an L3 hemilaminectomy, L4 laminectomy, L5  hemilaminectomy and an L5-S1 diskectomy on the right.  Postoperatively she  had continued pain in the right lower extremity.  A repeat MRI which was  done on October 29, 2002 showed a postoperative hematoma.  Although hematoma  was not undue size, it was felt that since she had significant pain in the  right lower extremity that she complained about since the first operation,  that would be removed.  She was therefore taken back to the operating room  on March 4.  She had a hematoma evacuation.  She did well for 24 hours  postoperatively but on Saturday March 6, she developed some redness around  the incision.  A white count was checked and she had an elevated white  count.  She had positive blood cultures which grew out on Sunday March 7.  I  therefore after seeing the drainage from her back this a.m. felt it would be  best that she be taken to the operating room for wound debridement.   DESCRIPTION OF PROCEDURE:  The patient was brought to the operating room  intubated and placed under general anesthesia without difficulty.  She was  placed under general anesthesia and rolled prone onto the Wilson  frame  without difficulty.  Pressure points were properly padded.  Her back was  prepped.  She was draped in sterile fashion.  I opened her incision and  there was immediate appearance of purulent fluid.  It was not particularly  thick, quite serous in nature.  A significant amount of change through the  surrounding tissue as it was yellowed and appeared to be infected and mildly  necrotic.  I therefore debrided the muscle and soft tissue until I got to  bleeding surfaces.  I did the same with bone, particularly the L5 spinous  process and the L3 spinous process which was remaining.  I then irrigated 3L  of antibiotic solution into the incision.  Due to the nature of the wound  and her positive blood cultures, positive urine cultures, I felt it  best to  pack her open, at least for right now.  I therefore packed the wound open  after achieving good bleeding from the surfaces.  There was no CSF which was  noted.  I placed umbilical tape and an appliance on the back to hold an ABD  pad on the incision.  The patient was then extubated moving all extremities.                                               Coletta Memos, M.D.   KC/MEDQ  D:  11/04/2002  T:  11/05/2002  Job:  [id number]

## 2011-01-14 NOTE — Consult Note (Signed)
NAME:  Connie Wagner, Connie Wagner                         ACCOUNT NO.:  [id number]   MEDICAL RECORD NO.:  [id number]                   PATIENT TYPE:  EMS   LOCATION:  MAJO                                 FACILITY:  [facility]   PHYSICIAN:  Payton Doughty, M.D.                   DATE OF BIRTH:  02/15/1929   DATE OF CONSULTATION:  01/04/2004  DATE OF DISCHARGE:                                   CONSULTATION   REFERRING PHYSICIAN:  Thora Lance, M.D.   I was asked to see this 77 year old right-handed white lady who has had a  drainage from epidural abscess and who had some increase in leg pain.  Abscess was drained at the end of March.  Her antibiotics were stopped a  week ago.  She was confused and slightly febrile yesterday.  She received an  empiric dose of Vancomycin yesterday and then transported to [facility] ER today because of some confusion and pain in her back and left  leg.  Her initial temperature in the ER was 101.4 and it is now 99.  Her  white count was 8.8 and electrolytes were normal.  She apparently had some  confusion at the nursing home.  She has no rigor.  By history, she has a  bipolar effective disorder, some depression, and arthritis.   MEDICATIONS:  1. Folic acid.  2. Seroquel.  3. K-Dur.  4. Prednisone.  5. Depakote.  6. Aricept.  7. Klonopin.   ALLERGIES:  SULFA, PENICILLIN, CIPROFLOXACIN.   PHYSICAL EXAMINATION:  GENERAL:  She is awake and alert, oriented x3.  To  percussion up and down her back, she had no pain and she moved herself onto  the MRI table.  Her motor examination is intact except for pain limitation  in the left leg.  She is areflexic in the lower extremities.  She said she  did have some numbness of the left leg.  MRI shows a markedly improved  appearance of the epidural abscess with mild involvement of the left L4-5  facet joint.  There is enhancement in the soft tissues, but this is once  again markedly improved compared with March 29  study.   IMPRESSION:  Improved, but not resolved epidural abscess.  I certainly do  not think there is any indication for an operation.  She needs to remain on  the Vancomycin and Rifampin.  She can return to the nursing home for these  antibiotics.  I did discuss this with Dr. Valentina Lucks who has asked me to let  her go back to the nursing home.                                               Payton Doughty, M.D.  MWR/MEDQ  D:  01/04/2004  T:  01/05/2004  Job:  [id number]

## 2011-01-14 NOTE — Discharge Summary (Signed)
NAME:  Connie Wagner, BHATT                         ACCOUNT NO.:  [id number]   MEDICAL RECORD NO.:  [id number]                   PATIENT TYPE:  IPS   LOCATION:  4027                                 FACILITY:  [facility]   PHYSICIAN:  Ellwood Dense, M.D.                DATE OF BIRTH:  09/19/1928   DATE OF ADMISSION:  12/05/2003  DATE OF DISCHARGE:  12/18/2003                                 DISCHARGE SUMMARY   DISCHARGE DIAGNOSES:  1. Lumbar hemilaminectomy for evacuation of epidural abscess.  2. Bipolar disorder.  3. Short-term memory deficits.  4. Rheumatoid arthritis.  5. Hyperactive bladder.   HISTORY OF PRESENT ILLNESS:  Connie Wagner is a 77 year old female with  history of bipolar disorder, severe RA, lumbar laminectomy L4 admitted March  29 with difficulty with ambulation and incontinence.  MRI done showed mild  narrowing at L2-L3 with thick stenosis of L3-L4, heterogenous material  within operative bed compressing T3 sac at L4-L5.  The patient was febrile.  She was started on IV vancomycin and Rocephin based on prior history of MRSA  bacteremia.  She has been treated with vancomycin x3 months in the past.  She was taken to OR on November 27, 2003, for L4-L5 hemilaminectomy for  drainage of abscess by Dr. Mikal Plane.  Postop has been able to move lower  extremities without difficulty.  ID was consulted for input and recommended  Rocephin until cultures were finalized, and vancomycin x6 weeks.  Wound  cultures were positive for MRSA that were sensitive for vancomycin.  Therefore, Rocephin discontinued on April 5.  Dr. __________ was consulted  for input regarding the patient's left hip pain.  The patient was treated  with burst dose of prednisone and methotrexate, to be resumed in a few  weeks.  Therapy was initiated, and the patient was monitored for bed  mobility, minimal assistance for transfers, minimal assistance ambulating in  short distances.   PAST MEDICAL HISTORY:  1. Bipolar  disorder.  2. Hyperactive bladder.  3. Rheumatoid arthritis.  4. Right total knee replacement.  5. Insomnia.  6. History of bronchitis.   ALLERGIES:  PENICILLIN, SULFA AND CIPRO.   SOCIAL HISTORY:  Patient lives with husband in a two-level home with four  steps at entry.  Was independent prior to admission.  She does not use any  tobacco.  Drinks approximately 3 beers per week.   HOSPITAL COURSE:  Connie Wagner was admitted to rehab on December 05, 2003,  for inpatient therapies to consist of PT/OT daily.  Past admission, she was  started on subcu Lovenox for DVT prophylaxis.  IV vancomycin has been  maintained throughout her stay.  Currently, the patient has completed 23  days out of 42 of her vancomycin treatment.  She completed her steroid burst  dose during her stay.  Once off steroid, she had complaints regarding slight  fullness, mild tenderness of ribs and  5 mg steroids per day were resumed on  December 16, 2003.  Labs were done past admission showing postoperative anemia  to be improving with hemoglobin at 10.3, hematocrit 30.6, white count 4.1,  platelets 225.  Check of electrolytes last April 15, showed sodium 132,  potassium 3.8, chloride 99, CO2 30, BUN 16, creatinine 0.7, glucose 94,  total protein 5.8, albumin 2.7.  The patient has had complaints regarding  frequency and a UA, UCS done initially showed insignificant growth.  She was  started on Ditropan XL and this was titrated to help with frequency/urgency  symptomatology.  The patient has had complaints of left lower extremity,  especially with certain movements.  Ultram was scheduled on q.i.d. basis,  and Neurontin was initiated to help assist with symptomatology.  Thereafter  Neurontin initiated therapy reported the patient having problems following  directions with problems with gait.  Neurontin was discontinued.  As the  patient continued with some occasional disorientation, Ultram was changed to  p.r.n. basis  also.   Husband reports the patient does have some problems with occasional  disorientation and confusion.  Safety awareness continues to be an issue.  Her PICC-line did come out on April 18, and she required another PICC-line  placement on December 16, 2003.  A trial of Aricept was initiated on December 16, 2003.  The patient's back incision has been clean and dry.  No drainage  noted.  She does have metal sutures in place with irritation around suture  line.  Currently, the patient is at minimal assistance for bed mobility and  close supervision with que's for transfer supervision, for static balance,  minimal assistance with ambulation 60 feet with verbal que's.  She requires  minimal assistance in gait and stairs.  Husband is concerned about the  amount of care required and his ability to care for her at home.  They have  elected a nursing home when a bed was available at [facility] on December 18, 2003.  The patient is to be discharged to this facility.   DISCHARGE MEDICATIONS:  1. Trazodone 50 mg p.o. q.h.s.  2. Prednisone 5 mg p.o. daily.  3. Aricept 5 mg p.o. q.h.s.  4. IV vancomycin.  5. Os-Cal + D one p.o. t.i.d.  6. K-Dur 20 mEq daily.  7. Depakote ER 1250 mg q.p.m.  8. Seroquel 25 mg p.o. daily.  9. Folic acid 1 mg p.o. daily.  10.      Ultram 50 mg p.o. q.i.d. p.r.n. pain.  11.      Klonopin 1-2 mg p.o. q.h.s. p.r.n.   ACTIVITY:  Supervision 24 hours.   DIET:  Regular.   WOUND CARE:  Keep area clean and dry.  Routine wound check.   SPECIAL INSTRUCTIONS:  Progressive PT/OT to continue past discharge.   FOLLOWUP:  The patient is to follow up with Dr. Franky Macho in 2-3 weeks.  Follow up with Dr. Pete Glatter for  postop check and reevaluation and workup  of questionable dementia.  Follow up with Dr. Lamar Benes as needed.      Greg Cutter, P.A.                    Ellwood Dense, M.D.    PP/MEDQ  D:  12/17/2003  T:  12/17/2003  Job:  [id number]  cc:   Hal T. Stoneking,  M.D.  [address]  [place], [place] [address]  Fax: [phone]   Rockey Situ. Flavia Shipper., M.D.  [address]  [place]  [place] [address]  Fax: [phone]   Coletta Memos, M.D.  [address]  [place]  [place] [address]  Fax: [phone]

## 2011-01-14 NOTE — Op Note (Signed)
NAME:  Connie Wagner, Connie Wagner                         ACCOUNT NO.:  [id number]   MEDICAL RECORD NO.:  [id number]                   PATIENT TYPE:  INP   LOCATION:  NA                                   FACILITY:  [facility]   PHYSICIAN:  Coletta Memos, M.D.                  DATE OF BIRTH:  07-16-1929   DATE OF PROCEDURE:  10/21/2002  DATE OF DISCHARGE:                                 OPERATIVE REPORT   PREOPERATIVE DIAGNOSES:  1. Lumbar stenosis, L3-4, L4-5.  2. Displaced disk, right L5-S1.   POSTOPERATIVE DIAGNOSES:  1. Lumbar stenosis, L3-4, L4-5.  2. Displaced disk, right L5-S1.   PROCEDURE:  Hemilaminectomy, L3, complete laminectomy, L4, hemi-  semilaminectomy, L5 and right S1, and diskectomy, L5-S1.   COMPLICATIONS:  None.   SURGEON:  Coletta Memos, M.D.   ASSISTANT:  Payton Doughty, M.D.   ANESTHESIA:  General endotracheal.   INDICATIONS:  The patient is a 77 year old who presented to my office with  severe pain which radiated down both legs and into the back and is worse  when she is standing and walking.  She had an MRI which showed a disk  herniation at L5-S1, lumbar stenosis at 3-4 and then 4-5.  I recommended  after she underwent epidural steroids without relief that she get lumbar  decompression.   DESCRIPTION OF PROCEDURE:  The patient was brought to the operating room,  intubated and placed under general anesthesia without difficulty.  Her back  was prepped and she was draped in a sterile fashion.  She was rolled prone  onto the Wilson frame and all pressure points were properly padded.  Skin  incision was made after infiltration of 20 mL of 0.5% lidocaine and  1:200,000 strength epinephrine in the paraspinous muscles and skin at the  proposed incision site.  I took the incision down to the thoracolumbar  fascia, exposed the laminae initially at what turned out to be L3, L4 and  L5.  This was confirmed placing a double-ended ganglion knife inferior to  the lamina of L4.  I  then extended the incision to fully expose L5-S1.  I  then proceeded with a complete laminectomy of L4 after placing a self-  retaining retractor and while using a Kerrison punch, I noticed that there  was a dural opening but no arachnoid opening so that there was no actual CSF  leak.  I protected that area and with Dr. Temple Pacini assistance, completed the  decompression there.  I placed a piece of Duragen over that area and after  being satisfied with my decompression, which I was, I then turned my  attention to the diskectomy.  A performed a semi-hemilaminectomy of L5 and  of S1 on the right side.  The right S1 nerve root and thecal sac were  clearly seen.  There was a large hard lump which was dissected free of the  anterior surface of the dura and was old calcified disk material.  There was  very little if any disk  space, but I was able to remove that lump and decompress that area quite  well.  I then irrigated the wound.  I then closed the wound in layered  fashion using Vicryl sutures.  Skin was reapproximated with Vicryl and  Dermabond used for a sterile dressing.  The patient was extubated and moving  all extremities postop.                                               Coletta Memos, M.D.    KC/MEDQ  D:  10/21/2002  T:  10/21/2002  Job:  [id number]

## 2011-01-14 NOTE — Discharge Summary (Signed)
NAME:  Connie Wagner, Connie Wagner                         ACCOUNT NO.:  [id number]   MEDICAL RECORD NO.:  [id number]                   PATIENT TYPE:  INP   LOCATION:  3039                                 FACILITY:  [facility]   PHYSICIAN:  Coletta Memos, M.D.                  DATE OF BIRTH:  1929-04-19   DATE OF ADMISSION:  10/21/2002  DATE OF DISCHARGE:  11/02/2002                                 DISCHARGE SUMMARY   ADMISSION DIAGNOSES:  1. Lumbar stenosis.  2. Lumbar radiculopathy.  3. Lumbar disc cyst L5-S1, right.  4. Spondylosis.   DISCHARGE DIAGNOSES:  1. Lumbar stenosis.  2. Lumbar radiculopathy.  3. Lumbar disc cyst L5-S1, right.  4. Spondylosis.   PROCEDURES:  1. Lumbar decompression at L3-L5, lumbar laminectomy and diskectomy, semi-     hemilaminectomy and diskectomy at L5-S1 with microdissection.  2. Lumbar hematoma evacuation on October 31, 2002.   HISTORY OF PRESENT ILLNESS:  The patient is a 77 year old who presented with  severe pain in the lower extremities bilaterally.  She had profound stenosis  at L3-L4 and L4-L5.  She also had a herniated disc at L5-S1 on the right  side.  After failure of conservative means, I recommended she agreed to  undergo operative decompression and a diskectomy.   HOSPITAL COURSE:  She was admitted and taken to the operating room on  October 21, 2002, and underwent an uncomplicated procedure.  Postoperatively, she had improved pain in her left lower extremity, but was  having spasms of pain on the right side.  She was seen by physical therapy  and was moved up and about and seem to improve, but never enough that she  felt she could go home.  I, therefore, had a repeat MRI performed.  This  showed the lumbar hematoma, although expected.  Since I could not separate  the hematoma from her pain, I felt the best thing to do would be to go ahead  and evacuate the hematoma.  She was taken back to the operating room on  October 31, 2002.  All hematoma was  present and was not impressive in size or  extent; however, I was able to remove it.  There did appear that there was  more room for the thecal sac and for the S1 nerve root on the right side.  She was doing much better postoperatively.  She was tolerating a regular  diet.  She was up and about moving around the room after discharge.  The  wound was clean and dry without any signs of infection.   DISCHARGE MEDICATIONS:  1. Vicodin for pain.  2.     Ambien for insomnia.  3. Continue other prehospital medications.   FOLLOW UP:  I will see her back in the office in three to four weeks.  Coletta Memos, M.D.    KC/MEDQ  D:  11/01/2002  T:  11/02/2002  Job:  [id number]

## 2011-01-14 NOTE — Consult Note (Signed)
NAME:  Connie Wagner, Connie Wagner                         ACCOUNT NO.:  [id number]   MEDICAL RECORD NO.:  [id number]                   PATIENT TYPE:  INP   LOCATION:  3039                                 FACILITY:  [facility]   PHYSICIAN:  Windy Fast L. Ovidio Hanger, M.D.           DATE OF BIRTH:  06/25/1929   DATE OF CONSULTATION:  11/19/2002  DATE OF DISCHARGE:                                   CONSULTATION   REFERRING PHYSICIAN:  Dr. Coletta Memos.   CHIEF COMPLAINT:  I have had leakage of urine.   HISTORY OF PRESENT ILLNESS:  The patient is a lovely 77 year old white  female who initially underwent an L3-L4-L5 hemilaminectomy and an L5-S1  diskectomy on the right on October 21, 2002.  Postoperatively, she had pain  and on repeat MRI done in March 2004, was shown to have a postoperative  hematoma.  She subsequently had hematoma evacuation, developed redness  around the incision and had to be admitted November 05, 2002 for wound  debridement and infection.  She subsequently developed a methicillin-  resistant Staph aureus infection and an E. coli wound infection and has  required prolonged hospitalization for that.  She has been doing well but  has had some disorientation.  She has been on IV antibiotics and is  resolving, but has subsequently developed urinary incontinence.  She notes  prior to hospitalization as not having incontinence, although in the distant  past, she has suffered some and had been on Ditropan, but she notes voiding  well prior to this.  She required placement of a Foley catheter due to the  incontinence and the Foley catheter was subsequently removed yesterday.  She  is now voiding well, has slight incontinence, but it is much less than it  was previously.  I have been asked to evaluate her for the incontinence.   ALLERGIES:  She is allergic to CIPRO, PENICILLIN, SULFA.   MEDICATIONS:  In addition to IV antibiotics consisting of vancomycin and  Rocephin, she was previously on  methotrexate, folic acid, clonazepam,  amitriptyline, Cosopt and Xalatan eye drops.   PAST MEDICAL HISTORY:  She has had rheumatoid arthritis which has been  problematic.  She has a history of depression.  She surgically has had a  right knee arthroscopy and a left knee arthroscopy, benign breast tumor  removed in the past, a foot surgery for plantar fasciitis and a right total  knee replacement in June of 2003.   SOCIAL HISTORY:  She is a negative smoker, negative drinker.   FAMILY HISTORY:  Family history is non-significant.   REVIEW OF SYSTEMS:  She had some disorientation recently but is now highly  oriented and was felt to be medication related.  She has no shortness of  breath, dyspnea on exertion or chest pain, no significant GI complaints, but  has the urinary incontinence as noted.  As noted, she has not had a large  postvoid residual, thus the catheter has come out.   PHYSICAL EXAMINATION:  VITAL SIGNS:  She is currently afebrile; temperature  is 98.6 degrees Fahrenheit.  Pulse 88.  Respirations 14.  Blood pressure is  127/63.  GENERAL:  She is well-nourished, well-developed and well-groomed.  HEENT:  Normal.  NECK:  Neck is without masses or thyromegaly.  CHEST:  Chest has normal diaphragmatic motion.  ABDOMEN:  Abdomen is soft and nontender, without mass, organomegaly or  herniae.  EXTREMITIES:  Extremities appear to be normal with motion.  NEUROLOGIC:  Neuro appears to be grossly intact.  BACK:  The wound is healing well and I did not evaluate that.   PERTINENT LABORATORY EVALUATION:  A recent urine culture revealed small  colony counts of yeast only.  BUN/creatinine most recently are 5/0.6.   IMPRESSION:  I discussed the situation in great detail with the patient and  her son.  We have elected to follow expectantly for now and follow up as an  outpatient.  As she becomes more mobile, it should improve.  I suspect it  relates to recumbency, medications, pain,  anesthesia, etc.  If it persists,  then urodynamics might be in order.   PLAN:  Plan is to discharge from the hospital when appropriate.  She will  call me as needed, see me for followup and please call as needed.   Thanks.                                                Ronald L. Ovidio Hanger, M.D.    RLD/MEDQ  D:  11/19/2002  T:  11/20/2002  Job:  [id number]   cc:   Coletta Memos, M.D.  [address]  [place]  [place] [address]  Fax: [phone]

## 2011-01-14 NOTE — Op Note (Signed)
[facility]  Patient:    Connie Wagner, Connie Wagner                      MRN: [id number] Proc. Date: 02/06/01 Adm. Date:  [id number] Attending:  Marlowe Kays Page                           Operative Report  PREOPERATIVE DIAGNOSES:  Osteoarthritis and possible medial meniscus tear, right knee.  POSTOPERATIVE DIAGNOSES: 1. 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 2. Multiple loose cartilaginous bodies. 3. Extensive tear, posterior horn medial meniscus, right knee.  PROCEDURE:  Right knee arthroscopy with: 1. Partial medial meniscectomy. 2. Shaving, medial femoral condyle. 3. Removal of multiple loose cartilaginous bodies.  SURGEON:  Illene Labrador. Aplington, M.D.  ASSISTANT:  Nurse.  ANESTHESIA:  General.  PATHOLOGY AND INDICATION FOR PROCEDURE:  She has had progressive pain for many months in the right knee.  Plain x-rays showed some mild degenerative changes. I had an MRI performed on August 03, 2000, which demonstrated no definite meniscus tear but perhaps some fraying of the free edge of the medial meniscus posteriorly and some wear in the knee.  We have tried her with three injections of Synvisc but because of progressive pain, I felt that an arthroscopy was preferable to jumping right in and performing a total knee replacement, and based on todays findings, this was a very sound decision.  DESCRIPTION OF PROCEDURE:  Satisfactory general anesthesia.  A pneumatic tourniquet, thigh stabilizer.  Right knee was prepped with Duraprep, draped in a sterile field.  Superior medial saline inflow.  First through an anterolateral portal, the medial compartment was evaluated.  Immediately apparent was _____ synovitis and numerous cartilaginous fragments.  Some of these I was able to remove with pituitaries and a rongeur, but a number of others I had to morcellize and remove piecemeal.  Most of the weightbearing surface of the medial femoral  condyle was warn.  I shaved this down until smooth with a combination of rasp and 3.5 shaver.  Posteriorly the entire posterior medial meniscus from just prior to the curve into the intercondylar area was badly torn.  I trimmed this back to a stable rim with baskets and then shaved it down until smooth with a 3.5 shaver.  The remaining rim was stable on probing.  I looked up the medial gutter and suprapatellar area.  No arthroscopically-treatable lesions were noted.  Then reversed portals, showed some mild synovitis laterally and some very minimal fraying of the inner border of the lateral meniscus, which did not require surgical treatment. There was only very minimal wear of the joint surfaces.  Looking up the lateral gutter and suprapatellar area, no additional abnormalities were noted. The knee joint was then irrigated until clear and all fluid removed.  The two anterior portals were closed with 4-0 nylon.  Marcaine 0.5% 20 cc with adrenalin was then instilled through the inflow apparatus, which was removed and this portal closed with 4-0 nylon as well.  Betadine, Adaptic, and a dry sterile dressing were applied.  Tourniquet was released.  She tolerated the procedure well and was taken to the recovery room in satisfactory condition with no known complications. DD:  02/06/01 TD:  02/06/01 Job: [id number]

## 2011-01-14 NOTE — Op Note (Signed)
NAME:  Connie Wagner, Connie Wagner                         ACCOUNT NO.:  [id number]   MEDICAL RECORD NO.:  [id number]                   PATIENT TYPE:  INP   LOCATION:  3107                                 FACILITY:  [facility]   PHYSICIAN:  Coletta Memos, M.D.                  DATE OF BIRTH:  21-May-1929   DATE OF PROCEDURE:  11/27/2003  DATE OF DISCHARGE:                                 OPERATIVE REPORT   PREOPERATIVE DIAGNOSIS:  Lumbar epidural abscess, L4-5.   POSTOPERATIVE DIAGNOSIS:  Lumbar epidural abscess, L4-5.   PROCEDURE:  Lumbar laminectomy for epidural abscess evacuation.   COMPLICATIONS:  None.   FINDINGS:  Purulent fluid in the paraspinous region, granulation tissue  found in the epidural space.   COMPLICATIONS:  None.   SURGEON:  Coletta Memos, M.D.   ANESTHESIA:  General endotracheal.   SPECIMENS:  Both bone and soft tissue sent to pathology for culture and  purulent material sent to microbiology for cultures.   INDICATIONS:  Connie Wagner is a 77 year old whom I know quite well after  taking her to the operating room on three occasions, in February and March  of 2004.  She was initially admitted at that time for a lumbar stenosis at  L4-5 and a herniated disk at L5-S1 on the right side.  She underwent a  decompressive laminectomy and diskectomy.  Postoperatively she did well, but  she was taken back to the operating room because she complained of  significant pain in the lower extremities.  A hematoma was identified, and  though it was not large, it was felt by myself that it could be causing some  of the pain.  I therefore took her back to the operating room and evacuated  the hematoma.  At that time I found no spinal fluid.  I also found no  evidence of infection.  Subsequently she did develop a wound infection  approximately four days after that operation.  The wound was then opened.  I  irrigated and the wound was closed by secondary intention.   She eventually left the  hospital and did quite well until approximately 2-3  weeks ago, when she started having pain and weakness in the lower  extremities.  She was admitted to the hospital on November 25, 2003, I believe.  An MRI was performed on November 26, 2003, and what it showed was localized  abscess material in the paraspinous region and what appeared to be an  epidural abscess.  I therefore recommended and she agreed to undergo  operative decompression.   OPERATIVE NOTE:  Connie Wagner was brought to the operating room.  She was  flipped into a prone position and she was prepped in a sterile fashion.  I  opened the old scar using a #10 blade and took this down through the fascial  and muscular layers.  I did encounter a pocket of purulent material;  it was  creamy white in appearance.  I sent that off for cultures.  I then was able  to identify the residual lamina of L5, removed some of that; removed also  some of the lateral margins of the L4 facet, especially on the left side  where the compression was worse.  Some of that bone was also sent to the lab  for culture.  I did, by inspection, free up the epidural space.  I was able  to place the probe into the neural foramens rather easily.  I then irrigated  with 3 L of normal saline.  I then closed the wound and reapproximated the  soft tissue.  I then reapproximated the skin edges with 2-0 wire.  Sterile  dressing was applied.  The patient was extubated and moving all extremities  postoperatively.                                               Coletta Memos, M.D.    KC/MEDQ  D:  11/27/2003  T:  11/28/2003  Job:  [id number]

## 2011-01-21 ENCOUNTER — Observation Stay (HOSPITAL_COMMUNITY)
Admission: EM | Admit: 2011-01-21 | Discharge: 2011-01-24 | DRG: 918 | Disposition: A | Discharge status: Nursing Home (Includes ICF) | Payer: Medicare Other | Attending: Internal Medicine | Admitting: Internal Medicine

## 2011-01-21 ENCOUNTER — Emergency Department (HOSPITAL_COMMUNITY): Payer: Medicare Other

## 2011-01-21 DIAGNOSIS — S0010XA Contusion of unspecified eyelid and periocular area, initial encounter: Secondary | ICD-10-CM | POA: Insufficient documentation

## 2011-01-21 DIAGNOSIS — R55 Syncope and collapse: Secondary | ICD-10-CM | POA: Insufficient documentation

## 2011-01-21 DIAGNOSIS — T4271XA Poisoning by unspecified antiepileptic and sedative-hypnotic drugs, accidental (unintentional), initial encounter: Secondary | ICD-10-CM | POA: Insufficient documentation

## 2011-01-21 DIAGNOSIS — Y9210 Unspecified residential institution as the place of occurrence of the external cause: Secondary | ICD-10-CM | POA: Insufficient documentation

## 2011-01-21 DIAGNOSIS — S40029A Contusion of unspecified upper arm, initial encounter: Secondary | ICD-10-CM | POA: Insufficient documentation

## 2011-01-21 DIAGNOSIS — I10 Essential (primary) hypertension: Secondary | ICD-10-CM | POA: Insufficient documentation

## 2011-01-21 DIAGNOSIS — S0003XA Contusion of scalp, initial encounter: Secondary | ICD-10-CM | POA: Insufficient documentation

## 2011-01-21 DIAGNOSIS — M545 Low back pain, unspecified: Secondary | ICD-10-CM | POA: Insufficient documentation

## 2011-01-21 DIAGNOSIS — B957 Other staphylococcus as the cause of diseases classified elsewhere: Secondary | ICD-10-CM | POA: Insufficient documentation

## 2011-01-21 DIAGNOSIS — M069 Rheumatoid arthritis, unspecified: Secondary | ICD-10-CM | POA: Insufficient documentation

## 2011-01-21 DIAGNOSIS — T510X1A Toxic effect of ethanol, accidental (unintentional), initial encounter: Secondary | ICD-10-CM | POA: Insufficient documentation

## 2011-01-21 DIAGNOSIS — E876 Hypokalemia: Secondary | ICD-10-CM | POA: Insufficient documentation

## 2011-01-21 DIAGNOSIS — S1093XA Contusion of unspecified part of neck, initial encounter: Secondary | ICD-10-CM | POA: Insufficient documentation

## 2011-01-21 DIAGNOSIS — F319 Bipolar disorder, unspecified: Secondary | ICD-10-CM | POA: Insufficient documentation

## 2011-01-21 DIAGNOSIS — W19XXXA Unspecified fall, initial encounter: Secondary | ICD-10-CM | POA: Insufficient documentation

## 2011-01-21 DIAGNOSIS — S8010XA Contusion of unspecified lower leg, initial encounter: Secondary | ICD-10-CM | POA: Insufficient documentation

## 2011-01-21 DIAGNOSIS — N390 Urinary tract infection, site not specified: Secondary | ICD-10-CM | POA: Insufficient documentation

## 2011-01-21 DIAGNOSIS — S2020XA Contusion of thorax, unspecified, initial encounter: Secondary | ICD-10-CM | POA: Insufficient documentation

## 2011-01-21 DIAGNOSIS — F1010 Alcohol abuse, uncomplicated: Secondary | ICD-10-CM | POA: Insufficient documentation

## 2011-01-21 DIAGNOSIS — T510X4A Toxic effect of ethanol, undetermined, initial encounter: Secondary | ICD-10-CM | POA: Insufficient documentation

## 2011-01-21 DIAGNOSIS — T426X1A Poisoning by other antiepileptic and sedative-hypnotic drugs, accidental (unintentional), initial encounter: Principal | ICD-10-CM | POA: Insufficient documentation

## 2011-01-21 DIAGNOSIS — Z96659 Presence of unspecified artificial knee joint: Secondary | ICD-10-CM | POA: Insufficient documentation

## 2011-01-21 DIAGNOSIS — I6529 Occlusion and stenosis of unspecified carotid artery: Secondary | ICD-10-CM | POA: Insufficient documentation

## 2011-01-21 LAB — URINALYSIS, ROUTINE W REFLEX MICROSCOPIC
Nitrite: NEGATIVE
Specific Gravity, Urine: 1.007 (ref 1.005–1.030)
Urobilinogen, UA: 0.2 mg/dL (ref 0.0–1.0)
pH: 6 (ref 5.0–8.0)

## 2011-01-21 LAB — URINE MICROSCOPIC-ADD ON

## 2011-01-21 LAB — POCT I-STAT, CHEM 8
BUN: 7 mg/dL (ref 6–23)
Calcium, Ion: 1.07 mmol/L — ABNORMAL LOW (ref 1.12–1.32)
Chloride: 102 mEq/L (ref 96–112)
Creatinine, Ser: 1.1 mg/dL (ref 0.4–1.2)
Glucose, Bld: 110 mg/dL — ABNORMAL HIGH (ref 70–99)
HCT: 37 % (ref 36.0–46.0)
Hemoglobin: 12.6 g/dL (ref 12.0–15.0)
Potassium: 3.9 mEq/L (ref 3.5–5.1)
Sodium: 139 meq/L (ref 135–145)
TCO2: 26 mmol/L (ref 0–100)

## 2011-01-22 LAB — CARDIAC PANEL(CRET KIN+CKTOT+MB+TROPI)
CK, MB: 1.7 ng/mL (ref 0.3–4.0)
CK, MB: 1.6 ng/mL (ref 0.3–4.0)
Total CK: 82 U/L (ref 7–177)
Total CK: 77 U/L (ref 7–177)

## 2011-01-22 LAB — DRUGS OF ABUSE SCREEN W/O ALC, ROUTINE URINE
Amphetamine Screen, Ur: NEGATIVE
Marijuana Metabolite: NEGATIVE
Phencyclidine (PCP): NEGATIVE
Propoxyphene: NEGATIVE

## 2011-01-22 LAB — CBC
HCT: 36.1 % (ref 36.0–46.0)
MCV: 93.3 fL (ref 78.0–100.0)
RBC: 3.87 MIL/uL (ref 3.87–5.11)
WBC: 5.2 10*3/uL (ref 4.0–10.5)

## 2011-01-22 LAB — COMPREHENSIVE METABOLIC PANEL
ALT: 20 U/L (ref 0–35)
AST: 27 U/L (ref 0–37)
Albumin: 3.3 g/dL — ABNORMAL LOW (ref 3.5–5.2)
Alkaline Phosphatase: 138 U/L — ABNORMAL HIGH (ref 39–117)
Potassium: 3.7 mEq/L (ref 3.5–5.1)
Sodium: 138 mEq/L (ref 135–145)
Total Protein: 7.2 g/dL (ref 6.0–8.3)

## 2011-01-22 LAB — CK TOTAL AND CKMB (NOT AT ARMC): Total CK: 94 U/L (ref 7–177)

## 2011-01-23 LAB — COMPREHENSIVE METABOLIC PANEL
ALT: 16 U/L (ref 0–35)
AST: 21 U/L (ref 0–37)
Alkaline Phosphatase: 116 U/L (ref 39–117)
Glucose, Bld: 101 mg/dL — ABNORMAL HIGH (ref 70–99)
Potassium: 3.1 mEq/L — ABNORMAL LOW (ref 3.5–5.1)
Sodium: 139 mEq/L (ref 135–145)
Total Protein: 6.4 g/dL (ref 6.0–8.3)

## 2011-01-23 LAB — CBC
HCT: 34.9 % — ABNORMAL LOW (ref 36.0–46.0)
Hemoglobin: 11.5 g/dL — ABNORMAL LOW (ref 12.0–15.0)
RDW: 15 % (ref 11.5–15.5)
WBC: 3.6 10*3/uL — ABNORMAL LOW (ref 4.0–10.5)

## 2011-01-24 DIAGNOSIS — R55 Syncope and collapse: Secondary | ICD-10-CM

## 2011-01-24 LAB — URINE CULTURE: Colony Count: 40000

## 2011-01-26 ENCOUNTER — Ambulatory Visit: Payer: PRIVATE HEALTH INSURANCE

## 2011-01-26 ENCOUNTER — Ambulatory Visit (HOSPITAL_COMMUNITY): Payer: Medicare Other
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
## 2011-02-04 ENCOUNTER — Emergency Department (HOSPITAL_COMMUNITY): Payer: Medicare Other

## 2011-02-04 ENCOUNTER — Emergency Department (HOSPITAL_COMMUNITY)
Admission: EM | Admit: 2011-02-04 | Discharge: 2011-02-05 | Disposition: A | Discharge status: Other Acute Inpatient Hospital | Payer: Medicare Other | Attending: Emergency Medicine | Admitting: Emergency Medicine

## 2011-02-04 DIAGNOSIS — R0789 Other chest pain: Secondary | ICD-10-CM | POA: Insufficient documentation

## 2011-02-04 DIAGNOSIS — M069 Rheumatoid arthritis, unspecified: Secondary | ICD-10-CM | POA: Insufficient documentation

## 2011-02-04 DIAGNOSIS — F3289 Other specified depressive episodes: Secondary | ICD-10-CM | POA: Insufficient documentation

## 2011-02-04 DIAGNOSIS — F1910 Other psychoactive substance abuse, uncomplicated: Secondary | ICD-10-CM | POA: Insufficient documentation

## 2011-02-04 DIAGNOSIS — I10 Essential (primary) hypertension: Secondary | ICD-10-CM | POA: Insufficient documentation

## 2011-02-04 DIAGNOSIS — F329 Major depressive disorder, single episode, unspecified: Secondary | ICD-10-CM | POA: Insufficient documentation

## 2011-02-04 LAB — RAPID URINE DRUG SCREEN, HOSP PERFORMED
Amphetamines: NOT DETECTED
Cocaine: NOT DETECTED
Opiates: NOT DETECTED
Tetrahydrocannabinol: NOT DETECTED

## 2011-02-04 LAB — DIFFERENTIAL
Basophils Absolute: 0 10*3/uL (ref 0.0–0.1)
Basophils Relative: 1 % (ref 0–1)
Neutro Abs: 2.8 10*3/uL (ref 1.7–7.7)
Neutrophils Relative %: 67 % (ref 43–77)

## 2011-02-04 LAB — URINE MICROSCOPIC-ADD ON

## 2011-02-04 LAB — URINALYSIS, ROUTINE W REFLEX MICROSCOPIC
Leukocytes, UA: NEGATIVE
Protein, ur: NEGATIVE mg/dL
Urobilinogen, UA: 0.2 mg/dL (ref 0.0–1.0)

## 2011-02-04 LAB — CBC
Hemoglobin: 12.1 g/dL (ref 12.0–15.0)
Platelets: 202 10*3/uL (ref 150–400)
RBC: 3.87 MIL/uL (ref 3.87–5.11)
WBC: 4.2 10*3/uL (ref 4.0–10.5)

## 2011-02-04 LAB — COMPREHENSIVE METABOLIC PANEL
Alkaline Phosphatase: 101 U/L (ref 39–117)
BUN: 8 mg/dL (ref 6–23)
CO2: 29 mEq/L (ref 19–32)
Calcium: 9.1 mg/dL (ref 8.4–10.5)
GFR calc Af Amer: >60 mL/min (ref >60)
GFR calc non Af Amer: >60 mL/min (ref >60)
Glucose, Bld: 113 mg/dL — ABNORMAL HIGH (ref 70–99)
Total Protein: 6.8 g/dL (ref 6.0–8.3)

## 2011-02-04 NOTE — Consult Note (Signed)
  Connie Wagner, BERINGER               ACCOUNT NO.:  [id number]  MEDICAL RECORD NO.:  [id number]  LOCATION:                                 FACILITY:  PHYSICIAN:  Thana Farr, MD    DATE OF BIRTH:  Dec 21, 1928  DATE OF CONSULTATION:  01/22/2011 DATE OF DISCHARGE:                                CONSULTATION   ADDENDUM:  ASSESSMENT AND PLAN:  Alcohol abuse - counseled the patient on slow withdrawal from alcohol consumption.  She reports consuming one bottle of alcohol nightly.  We would recommend that she taper use gradually and not discontinue abruptly since this consumption has now been going on for years.     Luan Moore, P.A.   ______________________________ Thana Farr, MD    TCJ/MEDQ  D:  01/22/2011  T:  01/23/2011  Job:  [id number]  cc:   Thana Farr, MD  Electronically Signed by Delice Bison JERNEJCIC P.A. on 02/03/2011 01:58:52 PM Electronically Signed by Thana Farr MD on 02/04/2011 12:33:53 PM
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
## 2011-02-05 ENCOUNTER — Inpatient Hospital Stay (HOSPITAL_COMMUNITY)
Admission: EM | Admit: 2011-02-05 | Discharge: 2011-02-10 | DRG: 897 | Disposition: A | Discharge status: Home / Self Care | Payer: No Typology Code available for payment source | Source: Ambulatory Visit | Attending: Psychiatry | Admitting: Psychiatry

## 2011-02-05 DIAGNOSIS — I10 Essential (primary) hypertension: Secondary | ICD-10-CM

## 2011-02-05 DIAGNOSIS — F1310 Sedative, hypnotic or anxiolytic abuse, uncomplicated: Secondary | ICD-10-CM

## 2011-02-05 DIAGNOSIS — F1010 Alcohol abuse, uncomplicated: Secondary | ICD-10-CM

## 2011-02-05 DIAGNOSIS — F4321 Adjustment disorder with depressed mood: Secondary | ICD-10-CM

## 2011-02-05 DIAGNOSIS — Z880 Allergy status to penicillin: Secondary | ICD-10-CM

## 2011-02-05 DIAGNOSIS — M129 Arthropathy, unspecified: Secondary | ICD-10-CM

## 2011-02-05 DIAGNOSIS — I6529 Occlusion and stenosis of unspecified carotid artery: Secondary | ICD-10-CM
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
## 2011-02-08 LAB — COMPREHENSIVE METABOLIC PANEL
AST: 21 U/L (ref 0–37)
Albumin: 3.3 g/dL — ABNORMAL LOW (ref 3.5–5.2)
BUN: 19 mg/dL (ref 6–23)
CO2: 29 mEq/L (ref 19–32)
Calcium: 9.4 mg/dL (ref 8.4–10.5)
Chloride: 98 mEq/L (ref 96–112)
Creatinine, Ser: 0.86 mg/dL (ref 0.4–1.2)
GFR calc non Af Amer: >60 mL/min (ref >60)
Total Bilirubin: 0.1 mg/dL — ABNORMAL LOW (ref 0.3–1.2)

## 2011-02-09 LAB — T3 UPTAKE: T3 Uptake Ratio: 40 % — ABNORMAL HIGH (ref 22.5–37.0)

## 2011-02-09 LAB — T4, FREE: Free T4: 1.22 ng/dL (ref 0.80–1.80)

## 2011-02-09 LAB — TSH: TSH: 1.72 u[IU]/mL (ref 0.350–4.500)

## 2011-02-11 NOTE — Discharge Summary (Signed)
NAMEARLICIA, Connie Wagner               ACCOUNT NO.:  [id number]  MEDICAL RECORD NO.:  [id number]  LOCATION:  0502                          FACILITY:  [facility]  PHYSICIAN:  Franchot Gallo, MD     DATE OF BIRTH:  1929/01/26  DATE OF ADMISSION:  02/05/2011 DATE OF DISCHARGE:  02/10/2011                              DISCHARGE SUMMARY   REASON FOR ADMISSION:  This is an 77 year old female that presented with a history of depression since her husband died a year ago.  Son committed suicide about that time last year.  She was feeling very depressed at the first anniversary with no one there to support her. She also has been abusing alcohol and abusing her Ambien.  FINAL IMPRESSION:  AXIS I:  Alcohol abuse, bereavement, abuse of hypnotic. AXIS II:  None. AXIS III:  Hypertension, arthritis, left carotid stenosis, recent fall, syncope from alcohol abuse withdrawal seizure.  AXIS IV:  Bereavement, lack of primary support. AXIS V:  Global Assessment of Functioning is 50-55.  PERTINENT LABS:  CBC within normal limits.  Urine drug screen negative. No measurable alcohol at the time of assessment.  Sodium was low at 128.  SIGNIFICANT FINDINGS:  The patient was admitted to the [facility] the [facility] addressing her alcohol use.  We started the patient on Campral and Librium as the patient was having some withdrawal symptoms, seen having some  problems with disorientation. She was having trouble with sleep and a problem with her appetite, noting a 7-pound weight loss.  She was having moderate depressive symptoms, rating it 5 on a scale of 1-10.  Denied any current suicidal thoughts on admission but did have some prior to.  No homicidal thoughts or delusions.  She was having no medication side effects.  We, in addition to the Campral and Librium, also had some Remeron for sleep and depression and to possibly help stimulate her appetite.  Her sleep began to improve, her appetite was  better.  Her depression was less, rating it as a 1 on a scale of 1-10.  We had also discontinued her Ambien.  The patient was stating that she was going to return to her prior living situation.  She was beginning to feel much better, stating that she was isolating herself prior to her admission and felt that she was now able to return to become an active member of her community.  On the day of discharge, her sleep was good, appetite was good.  Her depression had resolved, rating it a 0 on a scale of 1-10, adamantly denied any suicidal or homicidal thoughts, no auditory hallucinations or delusional thinking, no anxiety, no substance withdrawal or medication side effects.  DISCHARGE MEDICATIONS: 1. Campral 333 mg, taking one tablet t.i.d. 2. Remeron 50 mg, one at bedtime. 3. Zoloft 50 mg, one daily. 4. Amlodipine 5 mg daily. 5. Folic acid one daily. 6. Hydrochlorothiazide 12.5 daily. 7. Multivitamin daily. 8. Potassium chloride 20 mEq daily. 9. Thiamine 100 mg daily. 10.Nitrofurantoin one daily. 11.The patient was to stop taking her Ambien.  FOLLOW-UP APPOINTMENT:  Her follow-up appointment was with Donnie Aho at [facility] on February 16, 2011.  Phone number [phone].     Landry Corporal, N.P.   ______________________________ Franchot Gallo, MD    JO/MEDQ  D:  02/11/2011  T:  02/11/2011  Job:  [id number]  Electronically Signed by Limmie PatriciaP. on 02/11/2011 11:27:34 AM Electronically Signed by Franchot Gallo MD on 02/11/2011 04:26:26 PM

## 2011-03-28 NOTE — Discharge Summary (Signed)
Connie Wagner, BROECKER               ACCOUNT NO.:  [id number]  MEDICAL RECORD NO.:  [id number]           PATIENT TYPE:  I  LOCATION:  4706                         FACILITY:  [facility]  PHYSICIAN:  Deshone Lyssy I [name], MD      DATE OF BIRTH:  1929-03-12  DATE OF ADMISSION:  01/21/2011 DATE OF DISCHARGE:  01/24/2011                              DISCHARGE SUMMARY   PRIMARY CARE PHYSICIAN:  Hal T. Stoneking, MD  DISCHARGE DIAGNOSES: 1. Status post fall/syncope felt to be secondary to alcohol     intoxication and high dose of Ambien. 2. History of seizure, not on any seizure medication per Neurology     evaluation. 3. Hypertension. 4. Bipolar disorder. 5. Staphylococcus coagulase negative urinary tract infection. 6. Left internal carotid artery stenosis, 40-59%, on the left carotid,     need to be followed up closely with Dr. Pete Glatter and likely     elective endarterectomy to be done down the road.  PROCEDURES: 1. CT head without contrast:  No skull fracture or intracranial     hemorrhage. 2. Chest x-ray:  Calcified mildly tortuous aorta.  No infiltrate,     congestive heart failure, or pneumothorax. 3. X-ray of the lumbar spine:  Chronic mild upper lumbar superior     endplate compression, chronic severe lower lumbar disk     degeneration. 4. MRI without contrast:  Atrophy and small vessel disease. 5. CT head:  No evidence of traumatic intracranial injury or fracture.     Mild soft tissue swelling overlying the high right parietal     calvarium. 6. CT C-spine:  Negative. 7. Carotid duplex:  No right ICA stenosis.  Left 40-59% ICA stenosis,     highest end of range. 8. 2-D echo:  Report pending.  I will send the report to Dr.     Pete Glatter.  CONSULTATIONS:  Neurology consulted.  HISTORY OF PRESENT ILLNESS:  This is an 77 year old female who was admitted and discharged on Jan 11, 2011 for seizure, new onset.  The patient was found on the floor in the bathroom at her living  facility and the patient cannot recall the incident, found to have multiple bruises including one on the left side orbital area.  The patient was fully oriented but she cannot recall the event.  The patient was found to have a very high alcohol level of 133 and the patient noticed on Ambien 12.5 mg extended release. 1. Fall.  As the circumstances of the fall is unknown and the patient     cannot recall the event and found to have a significant alcohol     level in her body.  EKG did show normal sinus rhythm and inferior     infarct, age undetermined.  The patient was admitted to telemetry.     Cardiac enzymes were cycled which were negative.  2-D echo is     pending at this time.  We will plan to do 2-D echo today and     arrange followup with Dr. Pete Glatter.  Most likely, the cause of the     patient's fall is a  combination of alcohol intoxication and     benzodiazepine use.  The patient instructed of how to quit drinking     and we will decrease the dose of Ambien to 2 mg p.o. at bedtime.     The patient also seen by neurologist, Dr. Thad Ranger during hospital     stay and we do not feel seizure as the prior cause of the patient's     fall. 2. Alcohol intoxication.  The patient will discharged with thiamine,     folate, and multivitamin and instruction to quit was given to the     patient.  Dose of Ambien was decreased. 3. Left ICA stenosis around 40-59%.  This is need to be addressed and     followed every 6 months with Dr. Pete Glatter and appropriate time for     endarterectomy to be addressed by Dr. Pete Glatter. 4. Staphylococcus urinary tract infection.  The patient will be     treated with doxycycline for 5 days.  Currently, I do not have the     report of 2-D echo as we have a long weekend.  We will fax the     report to Dr. Pete Glatter.  Currently, we found the patient is stable     to be discharged and follow up with Dr. Pete Glatter.  DISCHARGE MEDICATIONS: 1. Doxycycline 100 mg twice  daily for 5 days. 2. Folic acid 1 mg daily. 3. Multivitamin 1 tablet daily. 4. Thiamine 100 mg p.o. daily. 5. Ambien 5 mg p.o. daily at bedtime. 6. Norvasc 5 mg daily. 7. Baclofen 10 mg p.o. 3 times daily. 8. Hydrochlorothiazide 12.5 mg p.o. daily. 9. Macrobid 50 mg daily. 10.Potassium chloride 20 mEq daily. 11.Zoloft 20 mg p.o. daily. 12.Timolol ophthalmic solution 1 drop every morning.     Connie Wagner Connie Helper, MD     HIE/MEDQ  D:  01/24/2011  T:  01/24/2011  Job:  [id number]  cc:   Hal T. Stoneking, M.D.  Electronically Signed by Ebony Cargo MD on 03/28/2011 02:34:47 PM

## 2011-05-04 ENCOUNTER — Ambulatory Visit (HOSPITAL_COMMUNITY)
Admission: RE | Admit: 2011-05-04 | Discharge: 2011-05-04 | Disposition: A | Discharge status: Home / Self Care | Payer: Medicare Other | Source: Ambulatory Visit | Attending: Geriatric Medicine | Admitting: Geriatric Medicine

## 2011-05-04 DIAGNOSIS — Z1231 Encounter for screening mammogram for malignant neoplasm of breast: Secondary | ICD-10-CM | POA: Insufficient documentation

## 2011-08-29 ENCOUNTER — Emergency Department (HOSPITAL_COMMUNITY)
Admission: EM | Admit: 2011-08-29 | Discharge: 2011-08-29 | Disposition: A | Discharge status: Home / Self Care | Payer: Medicare Other | Attending: Emergency Medicine | Admitting: Emergency Medicine

## 2011-08-29 ENCOUNTER — Emergency Department (HOSPITAL_COMMUNITY): Payer: Medicare Other

## 2011-08-29 ENCOUNTER — Encounter: Payer: Self-pay | Admitting: Family Medicine

## 2011-08-29 DIAGNOSIS — S42463A Displaced fracture of medial condyle of unspecified humerus, initial encounter for closed fracture: Secondary | ICD-10-CM | POA: Insufficient documentation

## 2011-08-29 DIAGNOSIS — M25529 Pain in unspecified elbow: Secondary | ICD-10-CM | POA: Insufficient documentation

## 2011-08-29 DIAGNOSIS — I10 Essential (primary) hypertension: Secondary | ICD-10-CM | POA: Insufficient documentation

## 2011-08-29 DIAGNOSIS — W06XXXA Fall from bed, initial encounter: Secondary | ICD-10-CM | POA: Insufficient documentation

## 2011-08-29 DIAGNOSIS — Y9210 Unspecified residential institution as the place of occurrence of the external cause: Secondary | ICD-10-CM | POA: Insufficient documentation

## 2011-08-29 HISTORY — DX: Essential (primary) hypertension: I10

## 2011-08-29 MED ORDER — HYDROCODONE-ACETAMINOPHEN 5-325 MG PO TABS
1.0000 | ORAL_TABLET | Freq: Once | ORAL | Status: AC
  Administered 2011-08-29: 1 via ORAL
  Filled 2011-08-29: qty 1

## 2011-08-29 MED ORDER — HYDROCODONE-ACETAMINOPHEN 5-325 MG PO TABS: 1.0000 | ORAL_TABLET | ORAL | Status: AC | PRN

## 2011-08-29 NOTE — ED Notes (Signed)
Pt returned from xray. NAD noted.

## 2011-08-29 NOTE — ED Notes (Signed)
PA at bedside. NAD noted at this time.

## 2011-08-29 NOTE — ED Notes (Signed)
Lupita Leash, ortho tech, at bedside.

## 2011-08-29 NOTE — ED Notes (Signed)
Patient transported to X-ray 

## 2011-08-29 NOTE — ED Notes (Signed)
PA at bedside.

## 2011-08-29 NOTE — ED Notes (Signed)
Per EMS: pt from [facility]. Reports that pt fell out of bed and hit her right elbow on nightstand.

## 2011-08-29 NOTE — ED Notes (Signed)
Dr. Bednar at bedside. 

## 2011-08-29 NOTE — ED Notes (Signed)
ZOX:[id number]<AV> Expected date:<BR> Expected time:<BR> Means of arrival:<BR> Comments:<BR> Ems/shoulder pain

## 2011-08-29 NOTE — ED Provider Notes (Signed)
History     CSN: [id number]  Arrival date & time 08/29/11  5638   First MD Initiated Contact with Patient 08/29/11 [phone]      Chief Complaint  Patient presents with  . Elbow Pain    (Consider location/radiation/quality/duration/timing/severity/associated sxs/prior treatment) HPI History obtained from the patient. Ms. Hofmeister is a pleasant 77 year old left-hand dominant female who presents with a chief complaint of elbow pain. She states that she awoke this morning and went to turn her alarm clock off. She was extending her right arm toward the clock when she apparently slid off the bed. The nightstand with the clock on it turned over and fell onto her right elbow. She was otherwise unhurt. She did not hit her head or lose consciousness, and is not on blood thinners. Since this time, she has had significant pain in the elbow with flexion and extension. She's not noticed any swelling or deformity to the area. Denies distal numbness or weakness. She did not take any medications at home prior to presentation. She has no history of injury or surgeries to this elbow in the past.  Past Medical History  Diagnosis Date  . Hypertension     Past Surgical History  Procedure Date  . Back surgery   . Joint replacement   Orthopedist: Dr. Thurston Hole with [facility]  History reviewed. No pertinent family history.  History  Substance Use Topics  . Smoking status: Never Smoker   . Smokeless tobacco: Not on file  . Alcohol Use: 0.6 oz/week    1 Glasses of wine per week    OB History    Grav Para Term Preterm Abortions TAB SAB Ect Mult Living                  Review of Systems  Constitutional: Negative.   HENT: Negative for facial swelling and neck pain.   Musculoskeletal: Positive for arthralgias. Negative for joint swelling.  Skin: Negative for wound.  Neurological: Negative for dizziness, speech difficulty, weakness and numbness.    Allergies  Penicillins  Home Medications  No  current outpatient prescriptions on file.  BP 144/73  Pulse 106  Temp(Src) 98.1 F (36.7 C) (Oral)  Resp 20  SpO2 100%  Physical Exam  Nursing note and vitals reviewed. Constitutional: She is oriented to person, place, and time. She appears well-developed and well-nourished. No distress.  HENT:  Head: Normocephalic and atraumatic.  Eyes: Conjunctivae and EOM are normal. Pupils are equal, round, and reactive to light.  Neck: Normal range of motion.  Musculoskeletal:       Right elbow: She exhibits decreased range of motion. She exhibits no swelling, no effusion and no deformity. tenderness found. Medial epicondyle and lateral epicondyle tenderness noted. No radial head and no olecranon process tenderness noted.       Pt with arm in gauze sling applied in triage; grimaces with flex/ext; no deformity noted. FROM at wrist; grip strength reduced as compared with L 2/2 pain in elbow. Neurovasc intact with sensory intact to lt touch in radial, median, ulnar dist. Good radial pulse. Cap refill <3.  Neurological: She is alert and oriented to person, place, and time. No cranial nerve deficit.  Skin: Skin is warm and dry. She is not diaphoretic.  Psychiatric: She has a normal mood and affect.    ED Course  Procedures (including critical care time)  Labs Reviewed - No data to display Dg Elbow Complete Right  08/29/2011  *RADIOLOGY REPORT*  Clinical Data: 78 year old  female with right elbow pain following fall.  RIGHT ELBOW - COMPLETE 3+ VIEW  Comparison: None  Findings: A nondisplaced fracture through the medial humeral condyle is noted and appears to extend into the joint. A joint effusion is present. There is no evidence of subluxation or dislocation. Moderate degenerative changes are identified. No other focal bony abnormalities are noted.  IMPRESSION: Nondisplaced medial humeral condylar fracture which appears to extend intraarticularly.  Original Report Authenticated By: Rosendo Gros, M.D.    Films reviewed by myself.   1. Closed fracture of medial condyle of humerus       MDM  8:03 AM Pt assessed. She has significant pain with flexion and extension. Neurovasc intact. I would not be surprised if she has a fracture. Plain films of the elbow ordered, along with pain medication. Will monitor closely.  8:43 AM Pt's films show medial condylar fx. Sling, post splint ordered; consult placed to [facility] to establish follow up. Discussed with Dr. Fonnie Jarvis.  9:18 AM Discussed with PA from [facility]. He advised that she call and make follow up with Dr. Dion Saucier on [facility] for further eval/tx. I instructed the patient on this. She was provided with rx for Norco. She verbalized understanding and agreed to plan.  Pt's neurovasc status was again checked after placement of splint; she remains intact to lt touch in radial, median, and ulnar distributions; cap refill <3.    Grant Fontana, [place] 08/29/11 [phone]

## 2011-08-29 NOTE — ED Provider Notes (Signed)
This 77 year old female is left-hand dominant and has a nondisplaced fracture to her right elbow at the medial condyle of the distal humerus with a closed injury with tenderness to the elbow the distal neurovascular status intact to the right hand with capillary refill less than 2 seconds normal light touch in intact motor strength in the distributions of the radial, median, and ulnar nerve function. Her hand wrist and shoulder are nontender her head and neck are nontender  Medical screening examination/treatment/procedure(s) were conducted as a shared visit with non-physician practitioner(s) and myself.  I personally evaluated the patient during the encounter  Hurman Horn, MD 08/30/11 [phone]

## 2011-08-29 NOTE — Progress Notes (Signed)
Orthopedic Tech Progress Note Patient Details:  Connie Wagner 10-08-1928 [id number]          Other Ortho Devices Type of Ortho Device: Other (comment) Ortho Device Location: shoulder immobilizer  Type of Splint: Long arm Splint Location: plaster splint placed on right upper extremity.       Patient ID: Connie Wagner, female   DOB: [birth year], 77 y.o.   MRN: [id number]   [name] 08/29/2011, 9:17 AM

## 2011-08-30 NOTE — ED Provider Notes (Signed)
Medical screening examination/treatment/procedure(s) were conducted as a shared visit with non-physician practitioner(s) and myself.  I personally evaluated the patient during the encounter  Hurman Horn, MD 08/30/11 [phone]

## 2011-09-14 ENCOUNTER — Other Ambulatory Visit: Payer: Self-pay

## 2011-09-14 ENCOUNTER — Encounter (HOSPITAL_COMMUNITY): Payer: Self-pay | Admitting: Emergency Medicine

## 2011-09-14 ENCOUNTER — Emergency Department (HOSPITAL_COMMUNITY): Payer: Medicare Other

## 2011-09-14 ENCOUNTER — Inpatient Hospital Stay (HOSPITAL_COMMUNITY)
Admission: EM | Admit: 2011-09-14 | Discharge: 2011-09-18 | DRG: 378 | Disposition: A | Discharge status: Home / Self Care | Payer: Medicare Other | Attending: Internal Medicine | Admitting: Internal Medicine

## 2011-09-14 DIAGNOSIS — R392 Extrarenal uremia: Secondary | ICD-10-CM | POA: Diagnosis present

## 2011-09-14 DIAGNOSIS — E876 Hypokalemia: Secondary | ICD-10-CM | POA: Diagnosis present

## 2011-09-14 DIAGNOSIS — D62 Acute posthemorrhagic anemia: Secondary | ICD-10-CM | POA: Diagnosis present

## 2011-09-14 DIAGNOSIS — S72453K Displaced supracondylar fracture without intracondylar extension of lower end of unspecified femur, subsequent encounter for closed fracture with nonunion: Secondary | ICD-10-CM | POA: Diagnosis present

## 2011-09-14 DIAGNOSIS — K2960 Other gastritis without bleeding: Secondary | ICD-10-CM | POA: Diagnosis present

## 2011-09-14 DIAGNOSIS — D649 Anemia, unspecified: Secondary | ICD-10-CM

## 2011-09-14 DIAGNOSIS — I498 Other specified cardiac arrhythmias: Secondary | ICD-10-CM | POA: Diagnosis not present

## 2011-09-14 DIAGNOSIS — K922 Gastrointestinal hemorrhage, unspecified: Secondary | ICD-10-CM

## 2011-09-14 DIAGNOSIS — F419 Anxiety disorder, unspecified: Secondary | ICD-10-CM | POA: Diagnosis present

## 2011-09-14 DIAGNOSIS — I9589 Other hypotension: Secondary | ICD-10-CM | POA: Diagnosis present

## 2011-09-14 DIAGNOSIS — R000 Tachycardia, unspecified: Secondary | ICD-10-CM | POA: Diagnosis present

## 2011-09-14 DIAGNOSIS — R001 Bradycardia, unspecified: Secondary | ICD-10-CM | POA: Diagnosis present

## 2011-09-14 DIAGNOSIS — F411 Generalized anxiety disorder: Secondary | ICD-10-CM | POA: Diagnosis present

## 2011-09-14 DIAGNOSIS — R55 Syncope and collapse: Secondary | ICD-10-CM | POA: Diagnosis present

## 2011-09-14 DIAGNOSIS — R7989 Other specified abnormal findings of blood chemistry: Secondary | ICD-10-CM | POA: Diagnosis not present

## 2011-09-14 DIAGNOSIS — K259 Gastric ulcer, unspecified as acute or chronic, without hemorrhage or perforation: Secondary | ICD-10-CM | POA: Diagnosis present

## 2011-09-14 DIAGNOSIS — K921 Melena: Principal | ICD-10-CM | POA: Diagnosis present

## 2011-09-14 DIAGNOSIS — I10 Essential (primary) hypertension: Secondary | ICD-10-CM | POA: Diagnosis present

## 2011-09-14 DIAGNOSIS — Z79899 Other long term (current) drug therapy: Secondary | ICD-10-CM

## 2011-09-14 DIAGNOSIS — F1010 Alcohol abuse, uncomplicated: Secondary | ICD-10-CM | POA: Diagnosis present

## 2011-09-14 DIAGNOSIS — R58 Hemorrhage, not elsewhere classified: Secondary | ICD-10-CM | POA: Diagnosis present

## 2011-09-14 DIAGNOSIS — D72829 Elevated white blood cell count, unspecified: Secondary | ICD-10-CM | POA: Diagnosis not present

## 2011-09-14 DIAGNOSIS — Z4789 Encounter for other orthopedic aftercare: Secondary | ICD-10-CM

## 2011-09-14 HISTORY — DX: Other chronic pain: G89.29

## 2011-09-14 HISTORY — DX: Low back pain, unspecified: M54.50

## 2011-09-14 HISTORY — DX: Low back pain: M54.5

## 2011-09-14 HISTORY — DX: Migraine, unspecified, not intractable, without status migrainosus: G43.909

## 2011-09-14 HISTORY — DX: Major depressive disorder, single episode, unspecified: F32.9

## 2011-09-14 HISTORY — DX: Rheumatoid arthritis, unspecified: M06.9

## 2011-09-14 HISTORY — DX: Gastrointestinal hemorrhage, unspecified: K92.2

## 2011-09-14 HISTORY — DX: Anemia, unspecified: D64.9

## 2011-09-14 HISTORY — DX: Displaced supracondylar fracture without intracondylar extension of lower end of unspecified femur, subsequent encounter for closed fracture with nonunion: S72.453K

## 2011-09-14 HISTORY — DX: Anxiety disorder, unspecified: F41.9

## 2011-09-14 HISTORY — DX: Depression, unspecified: F32.A

## 2011-09-14 LAB — CBC
HCT: 25 % — ABNORMAL LOW (ref 36.0–46.0)
MCV: 89.6 fL (ref 78.0–100.0)
Platelets: 287 10*3/uL (ref 150–400)
RBC: 2.79 MIL/uL — ABNORMAL LOW (ref 3.87–5.11)
WBC: 10.9 10*3/uL — ABNORMAL HIGH (ref 4.0–10.5)

## 2011-09-14 LAB — COMPREHENSIVE METABOLIC PANEL
Albumin: 3.1 g/dL — ABNORMAL LOW (ref 3.5–5.2)
BUN: 82 mg/dL — ABNORMAL HIGH (ref 6–23)
Creatinine, Ser: 0.69 mg/dL (ref 0.50–1.10)
Total Protein: 6.6 g/dL (ref 6.0–8.3)

## 2011-09-14 LAB — TYPE AND SCREEN: Unit division: 0

## 2011-09-14 LAB — URINALYSIS, ROUTINE W REFLEX MICROSCOPIC
Bilirubin Urine: NEGATIVE
Glucose, UA: NEGATIVE mg/dL
Nitrite: NEGATIVE
Specific Gravity, Urine: 1.016 (ref 1.005–1.030)

## 2011-09-14 LAB — URINE CULTURE

## 2011-09-14 LAB — DIFFERENTIAL
Eosinophils Relative: 0 % (ref 0–5)
Lymphocytes Relative: 8 % — ABNORMAL LOW (ref 12–46)
Lymphs Abs: 0.8 10*3/uL (ref 0.7–4.0)
Monocytes Absolute: 0.4 10*3/uL (ref 0.1–1.0)

## 2011-09-14 LAB — OCCULT BLOOD, POC DEVICE: Fecal Occult Bld: POSITIVE

## 2011-09-14 LAB — APTT: aPTT: 21 seconds — ABNORMAL LOW (ref 24–37)

## 2011-09-14 LAB — PROTIME-INR: INR: 1.09 (ref 0.00–1.49)

## 2011-09-14 LAB — LACTIC ACID, PLASMA: Lactic Acid, Venous: 2.1 mmol/L (ref 0.5–2.2)

## 2011-09-14 MED ORDER — ONDANSETRON HCL 4 MG PO TABS: 4.0000 mg | ORAL_TABLET | Freq: Four times a day (QID) | ORAL | Status: DC | PRN

## 2011-09-14 MED ORDER — FOLIC ACID 1 MG PO TABS
1.0000 mg | ORAL_TABLET | Freq: Every day | ORAL | Status: DC
  Administered 2011-09-14 – 2011-09-18 (×5): 1 mg via ORAL
  Filled 2011-09-14 (×6): qty 1

## 2011-09-14 MED ORDER — TIMOLOL HEMIHYDRATE 0.5 % OP SOLN
1.0000 [drp] | Freq: Every day | OPHTHALMIC | Status: DC
  Administered 2011-09-14 – 2011-09-18 (×5): 1 [drp] via OPHTHALMIC
  Filled 2011-09-14 (×4): qty 0.1

## 2011-09-14 MED ORDER — SODIUM CHLORIDE 0.9 % IV SOLN
80.0000 mg | INTRAVENOUS | Status: AC
  Administered 2011-09-14: 80 mg via INTRAVENOUS
  Filled 2011-09-14 (×2): qty 80

## 2011-09-14 MED ORDER — FUROSEMIDE 10 MG/ML IJ SOLN
20.0000 mg | Freq: Once | INTRAMUSCULAR | Status: AC
  Administered 2011-09-14: 22:00:00 20 mg via INTRAVENOUS
  Filled 2011-09-14: qty 2

## 2011-09-14 MED ORDER — ONDANSETRON HCL 4 MG/2ML IJ SOLN: 4.0000 mg | Freq: Four times a day (QID) | INTRAMUSCULAR | Status: DC | PRN

## 2011-09-14 MED ORDER — THIAMINE HCL 100 MG/ML IJ SOLN
100.0000 mg | Freq: Every day | INTRAMUSCULAR | Status: DC
  Filled 2011-09-14 (×5): qty 1

## 2011-09-14 MED ORDER — ACETAMINOPHEN 650 MG RE SUPP: 650.0000 mg | Freq: Four times a day (QID) | RECTAL | Status: DC | PRN

## 2011-09-14 MED ORDER — HYDROCODONE-ACETAMINOPHEN 5-325 MG PO TABS
1.0000 | ORAL_TABLET | ORAL | Status: DC | PRN
  Administered 2011-09-14 – 2011-09-16 (×4): 2 via ORAL
  Filled 2011-09-14 (×4): qty 2

## 2011-09-14 MED ORDER — ACETAMINOPHEN 325 MG PO TABS: 650.0000 mg | ORAL_TABLET | Freq: Once | ORAL | Status: DC

## 2011-09-14 MED ORDER — SODIUM CHLORIDE 0.9 % IV SOLN
INTRAVENOUS | Status: DC
  Administered 2011-09-14: 11:00:00 via INTRAVENOUS

## 2011-09-14 MED ORDER — ZOLPIDEM TARTRATE 5 MG PO TABS
5.0000 mg | ORAL_TABLET | Freq: Every evening | ORAL | Status: DC | PRN
  Administered 2011-09-17: 5 mg via ORAL
  Filled 2011-09-14: qty 1

## 2011-09-14 MED ORDER — SODIUM CHLORIDE 0.9 % IV SOLN
8.0000 mg/h | INTRAVENOUS | Status: DC
  Administered 2011-09-14 – 2011-09-16 (×4): 8 mg/h via INTRAVENOUS
  Filled 2011-09-14 (×9): qty 80

## 2011-09-14 MED ORDER — LORAZEPAM 1 MG PO TABS
1.0000 mg | ORAL_TABLET | Freq: Four times a day (QID) | ORAL | Status: AC | PRN
  Administered 2011-09-14 – 2011-09-15 (×2): 1 mg via ORAL
  Filled 2011-09-14 (×2): qty 2

## 2011-09-14 MED ORDER — MORPHINE SULFATE 2 MG/ML IJ SOLN
1.0000 mg | INTRAMUSCULAR | Status: DC | PRN
  Administered 2011-09-14 – 2011-09-15 (×2): 1 mg via INTRAVENOUS
  Filled 2011-09-14 (×2): qty 1

## 2011-09-14 MED ORDER — ADULT MULTIVITAMIN W/MINERALS CH
1.0000 | ORAL_TABLET | Freq: Every day | ORAL | Status: DC
  Administered 2011-09-14 – 2011-09-18 (×5): 1 via ORAL
  Filled 2011-09-14 (×5): qty 1

## 2011-09-14 MED ORDER — ALBUTEROL SULFATE (5 MG/ML) 0.5% IN NEBU: 2.5000 mg | INHALATION_SOLUTION | RESPIRATORY_TRACT | Status: DC | PRN

## 2011-09-14 MED ORDER — METOPROLOL TARTRATE 1 MG/ML IV SOLN
2.5000 mg | Freq: Four times a day (QID) | INTRAVENOUS | Status: DC | PRN
  Filled 2011-09-14: qty 5

## 2011-09-14 MED ORDER — VITAMIN B-1 100 MG PO TABS
100.0000 mg | ORAL_TABLET | Freq: Every day | ORAL | Status: DC
  Administered 2011-09-14 – 2011-09-18 (×5): 100 mg via ORAL
  Filled 2011-09-14 (×5): qty 1

## 2011-09-14 MED ORDER — ACETAMINOPHEN 325 MG PO TABS
650.0000 mg | ORAL_TABLET | Freq: Four times a day (QID) | ORAL | Status: DC | PRN
  Administered 2011-09-16 – 2011-09-18 (×4): 650 mg via ORAL
  Filled 2011-09-14 (×4): qty 2

## 2011-09-14 MED ORDER — WHITE PETROLATUM GEL
Status: AC
  Filled 2011-09-14: qty 5

## 2011-09-14 MED ORDER — DIPHENHYDRAMINE HCL 50 MG/ML IJ SOLN: 25.0000 mg | Freq: Once | INTRAMUSCULAR | Status: DC

## 2011-09-14 MED ORDER — LORAZEPAM 2 MG/ML IJ SOLN: 1.0000 mg | Freq: Four times a day (QID) | INTRAMUSCULAR | Status: AC | PRN

## 2011-09-14 MED ORDER — SODIUM CHLORIDE 0.9 % IV SOLN
INTRAVENOUS | Status: DC
  Administered 2011-09-14 – 2011-09-16 (×3): via INTRAVENOUS

## 2011-09-14 NOTE — ED Notes (Addendum)
Pt arrives via EMS with c/o generalized weakness for last several days. Pt had a large dark BM yesterday. Pt reports last night when ambulating to bathroom she became extremely weak and describes a near syncopal event where she rushed back to the bed to avoid passing out.  Pt also with c/o elbow immobilizer on right arm from previous fx and requesting different appliance.

## 2011-09-14 NOTE — ED Notes (Signed)
[id number] Ready 

## 2011-09-14 NOTE — Consult Note (Signed)
Subjective:   HPI  The patient is an 78 year old female who presents to the emergency room for evaluation because of a 2 or 3 day history of black tarry stools. She denies abdominal pain or hematemesis. She gives no history of peptic ulcer disease. She drinks wine on a regular basis. She has been using some nonsteroidal anti-inflammatory medications. She was found to be significantly anemic. She is being admitted for further evaluation and treatment.  Review of Systems She has been feeling weak and lightheaded. She denies chest pain or shortness of breath  Past Medical History  Diagnosis Date  . Hypertension    Past Surgical History  Procedure Date  . Back surgery   . Joint replacement   . Brain surgery    History   Social History  . Marital Status: Widowed    Spouse Name: N/A    Number of Children: N/A  . Years of Education: N/A   Occupational History  . Not on file.   Social History Main Topics  . Smoking status: Never Smoker   . Smokeless tobacco: Not on file  . Alcohol Use: 0.6 oz/week    1 Glasses of wine per week  . Drug Use: No  . Sexually Active:    Other Topics Concern  . Not on file   Social History Narrative  . No narrative on file   family history is not on file. Current facility-administered medications:0.9 %  sodium chloride infusion, , Intravenous, Continuous, Laray Anger, DO, Last Rate: 75 mL/hr at 09/14/11 1113;  pantoprazole (PROTONIX) 80 mg in sodium chloride 0.9 % 100 mL IVPB, 80 mg, Intravenous, STAT, Shanker Ghimire, MD;  pantoprazole (PROTONIX) 80 mg in sodium chloride 0.9 % 250 mL infusion, 8 mg/hr, Intravenous, Continuous, Jeoffrey Massed, MD Current outpatient prescriptions:amLODipine (NORVASC) 5 MG tablet, Take 5 mg by mouth daily.  , Disp: , Rfl: ;  baclofen (LIORESAL) 10 MG tablet, Take 10 mg by mouth 3 (three) times daily.  , Disp: , Rfl: ;  hydrochlorothiazide (MICROZIDE) 12.5 MG capsule, Take 12.5 mg by mouth daily.  , Disp: , Rfl: ;   timolol (BETIMOL) 0.5 % ophthalmic solution, Place 1 drop into the left eye daily.  , Disp: , Rfl:  Allergies  Allergen Reactions  . Penicillins Rash  . Sulfa Drugs Cross Reactors Rash     Objective:     BP 111/72  Pulse 75  Temp 98.2 F (36.8 C)  Resp 14  SpO2 99%  Gen. she is alert and oriented and in no acute distress  Skin pale  Heart regular rhythm no murmurs heard  Lungs clear  Abdomen soft nontender no hepatosplenomegaly  Laboratory No components found with this basename: d1      Assessment:     #1 gastrointestinal bleeding characterized by melena. This is most compatible with upper GI bleeding.      Plan:     The patient is being admitted to the hospital for further evaluation and treatment. She will be transfused blood. We will proceed with EGD tomorrow to evaluate the upper GI tract.     Component Value Date/Time   WBC 10.9* 09/14/2011 1116   HGB 8.3* 09/14/2011 1116   HCT 25.0* 09/14/2011 1116   PLT 287 09/14/2011 1116   ALT 12 09/14/2011 1116   AST 18 09/14/2011 1116   NA 136 09/14/2011 1116   K 3.8 09/14/2011 1116   CL 102 09/14/2011 1116   CREATININE 0.69 09/14/2011 1116   BUN  82* 09/14/2011 1116   CO2 21 09/14/2011 1116   CALCIUM 8.6 09/14/2011 1116   ALKPHOS 67 09/14/2011 1116

## 2011-09-14 NOTE — ED Notes (Signed)
Repositioned pt arm for comfort. Medicated for pain.

## 2011-09-14 NOTE — ED Provider Notes (Signed)
History     CSN: [id number]  Arrival date & time 09/14/11  1040    Chief Complaint  Patient presents with  . Weakness   HPI Pt was seen at 1120.  Per pt, c/o gradual onset and worsening of persistent generalized weakness and lightheadedness for the past several days.  States since yesterday she has been passing several "black stools."  Endorses one episode of near syncope last night when she got up to go to the bathroom.  Denies CP/palpitations, no SOB/cough, no N/V, no abd pain, no back pain, no visual changes, no focal motor weakness, no tingling/numbness in extremities, no bloody stools, no syncope.  PMD:  Dr. Merlene Laughter [facility]o:  [facility] Past Medical History  Diagnosis Date  . Hypertension     Past Surgical History  Procedure Date  . Back surgery   . Joint replacement   . Brain surgery     History  Substance Use Topics  . Smoking status: Never Smoker   . Smokeless tobacco: Not on file  . Alcohol Use: 0.6 oz/week    1 Glasses of wine per week   Review of Systems ROS: Statement: All systems negative except as marked or noted in the HPI; Constitutional: Negative for fever and chills. ; ; Eyes: Negative for eye pain, redness and discharge. ; ; ENMT: Negative for ear pain, hoarseness, nasal congestion, sinus pressure and sore throat. ; ; Cardiovascular: Negative for chest pain, palpitations, diaphoresis, dyspnea and peripheral edema. ; ; Respiratory: Negative for cough, wheezing and stridor. ; ; Gastrointestinal: Negative for nausea, vomiting, diarrhea, abdominal pain, blood in stool, hematemesis, jaundice and rectal bleeding. +dark stools. . ; ; Genitourinary: Negative for dysuria, flank pain and hematuria. ; ; Musculoskeletal: Negative for back pain and neck pain. Negative for swelling.; ; Skin: Negative for pruritus, rash, abrasions, blisters, bruising and skin lesion.; ; Neuro: Negative for headache, and neck stiffness. Negative for altered level of consciousness ,  altered mental status, extremity weakness, paresthesias, involuntary movement, seizure and syncope. +generalized weakness, lightheadedness, near syncope.     Allergies  Penicillins and Sulfa drugs cross reactors  Home Medications   Current Outpatient Rx  Name Route Sig Dispense Refill  . AMLODIPINE BESYLATE 5 MG PO TABS Oral Take 5 mg by mouth daily.      Marland Kitchen BACLOFEN 10 MG PO TABS Oral Take 10 mg by mouth 3 (three) times daily.      Marland Kitchen HYDROCHLOROTHIAZIDE 12.5 MG PO CAPS Oral Take 12.5 mg by mouth daily.      Marland Kitchen TIMOLOL HEMIHYDRATE 0.5 % OP SOLN Left Eye Place 1 drop into the left eye daily.        BP 119/56  Pulse 112  Temp 98.2 F (36.8 C)  Resp 15  SpO2 99%  Physical Exam 1125: Physical examination:  Nursing notes reviewed; Vital signs and O2 SAT reviewed;  Constitutional: Well developed, Well nourished, In no acute distress; Head:  Normocephalic, atraumatic; Eyes: EOMI, PERRL, No scleral icterus; ENMT: Mouth and pharynx normal, Mucous membranes dry; Neck: Supple, Full range of motion, No lymphadenopathy; Cardiovascular: +tachycardic rate, irregular rhythm with frequent ectopy, No murmur or gallop; Respiratory: Breath sounds clear & equal bilaterally, No rales, rhonchi, wheezes, or rub, Normal respiratory effort/excursion; Chest: Nontender, Movement normal; Abdomen: Soft, Nontender, Nondistended, Normal bowel sounds; Rectal exam performed w/permission of pt and ED Tech chaparone present.  Anal tone normal.  Non-tender, soft black stool in rectal vault, heme positive.  No fissures, no external hemorrhoids, no  palp masses.; Genitourinary: No CVA tenderness; Extremities: Pulses normal, +RUE orthopedic appliance in place. No calf edema or asymmetry.; Neuro: AA&Ox3, Major CN grossly intact.  No gross focal motor or sensory deficits in extremities.; Skin: Color pale, Warm, Dry, no rash.     ED Course  Procedures    MDM  MDM Reviewed: nursing note, vitals and previous chart Reviewed  previous: labs and ECG Interpretation: labs, ECG and x-ray    Date: 09/14/2011  Rate: 110  Rhythm: normal sinus rhythm and premature atrial contractions (PAC)  QRS Axis: normal  Intervals: normal  ST/T Wave abnormalities: normal  Conduction Disutrbances:none  Narrative Interpretation:   Old EKG Reviewed: unchanged; no significant changes from previous EKG dated 02/04/2011.  Results for orders placed during the hospital encounter of 09/14/11  LACTIC ACID, PLASMA      Component Value Range   Lactic Acid, Venous 2.1  0.5 - 2.2 (mmol/L)  LIPASE, BLOOD      Component Value Range   Lipase 23  11 - 59 (U/L)  COMPREHENSIVE METABOLIC PANEL      Component Value Range   Sodium 136  135 - 145 (mEq/L)   Potassium 3.8  3.5 - 5.1 (mEq/L)   Chloride 102  96 - 112 (mEq/L)   CO2 21  19 - 32 (mEq/L)   Glucose, Bld 131 (*) 70 - 99 (mg/dL)   BUN 82 (*) 6 - 23 (mg/dL)   Creatinine, Ser 2.13  0.50 - 1.10 (mg/dL)   Calcium 8.6  8.4 - 08.6 (mg/dL)   Total Protein 6.6  6.0 - 8.3 (g/dL)   Albumin 3.1 (*) 3.5 - 5.2 (g/dL)   AST 18  0 - 37 (U/L)   ALT 12  0 - 35 (U/L)   Alkaline Phosphatase 67  39 - 117 (U/L)   Total Bilirubin 0.2 (*) 0.3 - 1.2 (mg/dL)   GFR calc non Af Amer 80 (*) >90 (mL/min)   GFR calc Af Amer >90  >90 (mL/min)  CBC      Component Value Range   WBC 10.9 (*) 4.0 - 10.5 (K/uL)   RBC 2.79 (*) 3.87 - 5.11 (MIL/uL)   Hemoglobin 8.3 (*) 12.0 - 15.0 (g/dL)   HCT 57.8 (*) 46.9 - 46.0 (%)   MCV 89.6  78.0 - 100.0 (fL)   MCH 29.7  26.0 - 34.0 (pg)   MCHC 33.2  30.0 - 36.0 (g/dL)   RDW 62.9  52.8 - 41.3 (%)   Platelets 287  150 - 400 (K/uL)  DIFFERENTIAL      Component Value Range   Neutrophils Relative 89 (*) 43 - 77 (%)   Neutro Abs 9.6 (*) 1.7 - 7.7 (K/uL)   Lymphocytes Relative 8 (*) 12 - 46 (%)   Lymphs Abs 0.8  0.7 - 4.0 (K/uL)   Monocytes Relative 3  3 - 12 (%)   Monocytes Absolute 0.4  0.1 - 1.0 (K/uL)   Eosinophils Relative 0  0 - 5 (%)   Eosinophils Absolute 0.0  0.0 -  0.7 (K/uL)   Basophils Relative 0  0 - 1 (%)   Basophils Absolute 0.0  0.0 - 0.1 (K/uL)  APTT      Component Value Range   aPTT 21 (*) 24 - 37 (seconds)  PROTIME-INR      Component Value Range   Prothrombin Time 14.3  11.6 - 15.2 (seconds)   INR 1.09  0.00 - 1.49   URINALYSIS, ROUTINE W REFLEX MICROSCOPIC  Component Value Range   Color, Urine YELLOW  YELLOW    APPearance CLEAR  CLEAR    Specific Gravity, Urine 1.016  1.005 - 1.030    pH 5.0  5.0 - 8.0    Glucose, UA NEGATIVE  NEGATIVE (mg/dL)   Hgb urine dipstick MODERATE (*) NEGATIVE    Bilirubin Urine NEGATIVE  NEGATIVE    Ketones, ur NEGATIVE  NEGATIVE (mg/dL)   Protein, ur NEGATIVE  NEGATIVE (mg/dL)   Urobilinogen, UA 0.2  0.0 - 1.0 (mg/dL)   Nitrite NEGATIVE  NEGATIVE    Leukocytes, UA SMALL (*) NEGATIVE   OCCULT BLOOD, POC DEVICE      Component Value Range   Fecal Occult Bld POSITIVE    URINE MICROSCOPIC-ADD ON      Component Value Range   WBC, UA 3-6  <3 (WBC/hpf)   RBC / HPF 3-6  <3 (RBC/hpf)   Bacteria, UA RARE  RARE     Results for ZIZA, HASTINGS (MRN [id number]) as of 09/14/2011 14:19  Ref. Range 01/23/2011 06:14 02/04/2011 19:31 09/14/2011 11:16  HGB Latest Range: 12.0-15.0 g/dL 24.4 (L) 01.0 8.3 (L)  HCT Latest Range: 36.0-46.0 % 34.9 (L) 36.2 25.0 (L)    Dg Chest 2 View 09/14/2011  *RADIOLOGY REPORT*  Clinical Data: Syncope.  CHEST - 2 VIEW  Comparison: PA and lateral chest 04/25/2008 and single view of the chest 02/04/2011.  Findings: Lungs are clear.  Heart size is normal.  No pneumothorax or pleural effusion.  Compression fracture deformities are seen in the lower thoracic spine at T9 and T10.  Fractures were present on the most recent comparison study.  IMPRESSION: No acute finding.  Original Report Authenticated By: Bernadene Bell. D'ALESSIO, M.D.      2:18 PM:  No stooling in ED.  +orthostatic on VS.  Denies CP/SOB.  Hgb lower that pt's usual 11-12.  T/C to [facility], case discussed, including:  HPI, pertinent  PM/SHx, VS/PE, dx testing, ED course and treatment.  Agreeable to admit.  Requests to obtain step down bed to team 2.            Quetzal Meany Allison Quarry, DO 09/15/11 [phone]

## 2011-09-14 NOTE — H&P (Signed)
PATIENT DETAILS Name: Connie Wagner Age: 78 y.o. Sex: female Date of Birth: September 14, 1928 Admit Date: 09/14/2011 [id number],QIO [name], MD, MD   CHIEF COMPLAINT:  Melena for 3 days Pre-syncopal episode this morning  HPI: Patient is a 78 year old retired Engineer, civil (consulting), with ongoing alcohol use, who recently broke her right arm in December of 2012-his on a immobilizer-claims to be using over-the-counter pain medications-comes in with the above noted problems. The patient for the last 2-3 days she has noted her stools to be very black in color. She denies any hematemesis though. Her last bowel movement was around 3-4 hours ago here in the emergency room. This morning upon waking up in attempting to go to the bathroom patient felt very weak and almost had a syncopal episode. She managed to walk back to her bedroom call for help, patient lives in a independent living facility. She was then brought here to the emergency room for further evaluation, for emergency  physician upon rectal exam, patient had frank melanotic stools seen on the finger. FOBT stool was also positive. She was found to be significantly anemic and the hospitalist service was asked to admit this patient for further evaluation and treatment. She denies any abdominal pain. She denies any chest pain or shortness of breath. She is very anxious. She claims to drink approximately one bottle of wine everyday.   ALLERGIES:   Allergies  Allergen Reactions  . Penicillins Rash  . Sulfa Drugs Cross Reactors Rash    PAST MEDICAL HISTORY: Past Medical History  Diagnosis Date  . Hypertension     PAST SURGICAL HISTORY: Past Surgical History  Procedure Date  . Back surgery   . Joint replacement   . Brain surgery     MEDICATIONS AT HOME: Prior to Admission medications   Medication Sig Start Date End Date Taking? Authorizing Provider  amLODipine (NORVASC) 5 MG tablet Take 5 mg by mouth daily.     Yes Historical Provider, MD  baclofen  (LIORESAL) 10 MG tablet Take 10 mg by mouth 3 (three) times daily.     Yes Historical Provider, MD  hydrochlorothiazide (MICROZIDE) 12.5 MG capsule Take 12.5 mg by mouth daily.     Yes Historical Provider, MD  timolol (BETIMOL) 0.5 % ophthalmic solution Place 1 drop into the left eye daily.     Yes Historical Provider, MD    FAMILY HISTORY: History reviewed. No pertinent family history.  SOCIAL HISTORY:  reports that she has never smoked. She does not have any smokeless tobacco history on file. She reports that she drinks about .6 ounces of alcohol per week. She reports that she does not use illicit drugs.  REVIEW OF SYSTEMS:  Constitutional:   No  weight loss, night sweats,  Fevers, chills, fatigue.  HEENT:    No headaches, Difficulty swallowing,Tooth/dental problems,Sore throat,  No sneezing, itching, ear ache, nasal congestion, post nasal drip,   Cardio-vascular: No chest pain,  Orthopnea, PND, swelling in lower extremities, anasarca, dizziness, palpitations  GI:  No heartburn, indigestion, abdominal pain, nausea, vomiting, diarrhea, change in  bowel habits, loss of appetite  Resp: No shortness of breath with exertion or at rest.  No excess mucus, no productive cough, No non-productive cough,  No coughing up of blood.No change in color of mucus.No wheezing.No chest wall deformity  Skin:  no rash or lesions.  GU:  no dysuria, change in color of urine, no urgency or frequency.  No flank pain.  Musculoskeletal: No joint pain or swelling.  No  decreased range of motion.  No back pain.  Psych: No change in mood or affect. No depression or anxiety.  No memory loss.   PHYSICAL EXAM: Blood pressure 111/72, pulse 75, temperature 98.2 F (36.8 C), resp. rate 14, SpO2 99.00%.  General appearance :Awake, alert, not in any distress. Speech Clear. Not toxic Looking HEENT: Atraumatic and Normocephalic, pupils equally reactive to light and accomodation Neck: supple, no JVD. No  cervical lymphadenopathy.  Chest:Good air entry bilaterally, no added sounds  CVS: S1 S2 regular, no murmurs.  Abdomen: Bowel sounds present, Non tender and not distended with no gaurding, rigidity or rebound. Extremities: B/L Lower Ext shows no edema, both legs are warm to touch, with  dorsalis pedis pulses palpable. Neurology: Awake alert, and oriented X 3, CN II-XII intact, Non focal, Deep Tendon Reflex-2+ all over, plantar's downgoing B/L, sensory exam is grossly intact.  Skin:No Rash Wounds:N/A  LABS ON ADMISSION:   Basename 09/14/11 1116  NA 136  K 3.8  CL 102  CO2 21  GLUCOSE 131*  BUN 82*  CREATININE 0.69  CALCIUM 8.6  MG --  PHOS --    Basename 09/14/11 1116  AST 18  ALT 12  ALKPHOS 67  BILITOT 0.2*  PROT 6.6  ALBUMIN 3.1*    Basename 09/14/11 1116  LIPASE 23  AMYLASE --    Basename 09/14/11 1116  WBC 10.9*  NEUTROABS 9.6*  HGB 8.3*  HCT 25.0*  MCV 89.6  PLT 287   No results found for this basename: CKTOTAL:3,CKMB:3,CKMBINDEX:3,TROPONINI:3 in the last 72 hours No results found for this basename: DDIMER:2 in the last 72 hours No components found with this basename: POCBNP:3   RADIOLOGIC STUDIES ON ADMISSION: Dg Chest 2 View  09/14/2011  *RADIOLOGY REPORT*  Clinical Data: Syncope.  CHEST - 2 VIEW  Comparison: PA and lateral chest 04/25/2008 and single view of the chest 02/04/2011.  Findings: Lungs are clear.  Heart size is normal.  No pneumothorax or pleural effusion.  Compression fracture deformities are seen in the lower thoracic spine at T9 and T10.  Fractures were present on the most recent comparison study.  IMPRESSION: No acute finding.  Original Report Authenticated By: Bernadene Bell. D'ALESSIO, M.D.   Dg Elbow Complete Right  08/29/2011  *RADIOLOGY REPORT*  Clinical Data: 78 year old female with right elbow pain following fall.  RIGHT ELBOW - COMPLETE 3+ VIEW  Comparison: None  Findings: A nondisplaced fracture through the medial humeral condyle is  noted and appears to extend into the joint. A joint effusion is present. There is no evidence of subluxation or dislocation. Moderate degenerative changes are identified. No other focal bony abnormalities are noted.  IMPRESSION: Nondisplaced medial humeral condylar fracture which appears to extend intraarticularly.  Original Report Authenticated By: Rosendo Gros, M.D.    ASSESSMENT AND PLAN: Present on Admission:  .Upper GI bleeding -We'll admit to a step down unit, will go and transfuse 2 units of PRBC.  -Protonix infusion has been ordered by me  -Continue to keep her n.p.o., I have spoken with Dr. Bing Matter GI on call-he will evaluate shortly for EGD.   Marland KitchenAnemia associated with acute blood loss -This is secondary to #1, she would transfuse 2 units of PRBC. -H&H will be checked frequently.   .Tachycardia -P wave seen on the EKG, not sure whether this is some form of atrial tachycardia or sinus tachycardia as the rhythm is still irregular.  -In any event we can only do rate control at this point  in time, she's not a candidate for anticoagulation currently. -Patient's rate varies from the low 100s to 120s, for now we will place on as needed Lopressor-blood pressure permitting. Hopefully with transfusion of PRBCs her rate will slow down.   ETOH abuse -Patient continues to drink approximately a bottle of wine a day, she's had a admission in behavioral health center within the last year for this similar problem. She apparently was  using benzodiazepine then as well.  -For now we shall place a her on Ativan per ciwaa protocol.timing will also be prescribed along with multivitamins and folate.   .Supracondylar fracture of humerus, closed -She follows up with Dr. Rayvon Char -Continue her current splint   Further plan will depend as patient's clinical course evolves and further radiologic and laboratory data become available. Patient will be monitored closely.  DVT Prophylaxis:   bilateral SCDs   Code Status: Full code  Total time spent for admission equals 45 minutes.  Jeoffrey Massed 09/14/2011, 2:55 PM

## 2011-09-15 ENCOUNTER — Encounter (HOSPITAL_COMMUNITY)
Admission: EM | Disposition: A | Discharge status: Home / Self Care | Payer: Self-pay | Source: Home / Self Care | Attending: Internal Medicine

## 2011-09-15 ENCOUNTER — Other Ambulatory Visit: Payer: Self-pay | Admitting: Gastroenterology

## 2011-09-15 ENCOUNTER — Encounter (HOSPITAL_COMMUNITY): Payer: Self-pay

## 2011-09-15 DIAGNOSIS — I9589 Other hypotension: Secondary | ICD-10-CM | POA: Diagnosis present

## 2011-09-15 DIAGNOSIS — R58 Hemorrhage, not elsewhere classified: Secondary | ICD-10-CM | POA: Diagnosis present

## 2011-09-15 DIAGNOSIS — D72829 Elevated white blood cell count, unspecified: Secondary | ICD-10-CM | POA: Diagnosis not present

## 2011-09-15 DIAGNOSIS — R001 Bradycardia, unspecified: Secondary | ICD-10-CM | POA: Diagnosis present

## 2011-09-15 DIAGNOSIS — R392 Extrarenal uremia: Secondary | ICD-10-CM | POA: Diagnosis present

## 2011-09-15 DIAGNOSIS — K259 Gastric ulcer, unspecified as acute or chronic, without hemorrhage or perforation: Secondary | ICD-10-CM | POA: Diagnosis present

## 2011-09-15 HISTORY — PX: ESOPHAGOGASTRODUODENOSCOPY: SHX5428

## 2011-09-15 LAB — COMPREHENSIVE METABOLIC PANEL
AST: 17 U/L (ref 0–37)
Albumin: 2.8 g/dL — ABNORMAL LOW (ref 3.5–5.2)
BUN: 50 mg/dL — ABNORMAL HIGH (ref 6–23)
Calcium: 8.1 mg/dL — ABNORMAL LOW (ref 8.4–10.5)
Chloride: 109 mEq/L (ref 96–112)
Creatinine, Ser: 0.72 mg/dL (ref 0.50–1.10)
GFR calc non Af Amer: 79 mL/min — ABNORMAL LOW (ref >90)
Total Bilirubin: 0.3 mg/dL (ref 0.3–1.2)

## 2011-09-15 LAB — GLUCOSE, CAPILLARY: Glucose-Capillary: 99 mg/dL (ref 70–99)

## 2011-09-15 LAB — HEMOGLOBIN AND HEMATOCRIT, BLOOD
HCT: 30.7 % — ABNORMAL LOW (ref 36.0–46.0)
Hemoglobin: 10.3 g/dL — ABNORMAL LOW (ref 12.0–15.0)
Hemoglobin: 9.7 g/dL — ABNORMAL LOW (ref 12.0–15.0)

## 2011-09-15 LAB — CBC
HCT: 29.8 % — ABNORMAL LOW (ref 36.0–46.0)
MCH: 30.5 pg (ref 26.0–34.0)
MCV: 90 fL (ref 78.0–100.0)
Platelets: 207 10*3/uL (ref 150–400)
RDW: 14.4 % (ref 11.5–15.5)

## 2011-09-15 SURGERY — EGD (ESOPHAGOGASTRODUODENOSCOPY)
Anesthesia: Moderate Sedation

## 2011-09-15 MED ORDER — MIDAZOLAM HCL 10 MG/2ML IJ SOLN
INTRAMUSCULAR | Status: AC
  Filled 2011-09-15: qty 2

## 2011-09-15 MED ORDER — MIRTAZAPINE 15 MG PO TABS
15.0000 mg | ORAL_TABLET | Freq: Every day | ORAL | Status: DC
  Administered 2011-09-15 – 2011-09-17 (×3): 15 mg via ORAL
  Filled 2011-09-15 (×5): qty 1

## 2011-09-15 MED ORDER — MIDAZOLAM HCL 10 MG/2ML IJ SOLN
INTRAMUSCULAR | Status: DC | PRN
  Administered 2011-09-15 (×3): 2 mg via INTRAVENOUS

## 2011-09-15 MED ORDER — DIPHENHYDRAMINE HCL 50 MG/ML IJ SOLN
25.0000 mg | Freq: Four times a day (QID) | INTRAMUSCULAR | Status: DC | PRN
  Administered 2011-09-15: 19:00:00 25 mg via INTRAVENOUS
  Filled 2011-09-15: qty 1

## 2011-09-15 MED ORDER — POTASSIUM CHLORIDE CRYS ER 20 MEQ PO TBCR
20.0000 meq | EXTENDED_RELEASE_TABLET | Freq: Once | ORAL | Status: AC
  Administered 2011-09-15: 20 meq via ORAL
  Filled 2011-09-15: qty 1

## 2011-09-15 MED ORDER — FENTANYL NICU IV SYRINGE 50 MCG/ML
INJECTION | INTRAMUSCULAR | Status: DC | PRN
  Administered 2011-09-15 (×2): 25 ug via INTRAVENOUS

## 2011-09-15 MED ORDER — SERTRALINE HCL 25 MG PO TABS
25.0000 mg | ORAL_TABLET | Freq: Every day | ORAL | Status: DC
  Administered 2011-09-15 – 2011-09-18 (×4): 25 mg via ORAL
  Filled 2011-09-15 (×4): qty 1

## 2011-09-15 MED ORDER — FENTANYL CITRATE 0.05 MG/ML IJ SOLN
INTRAMUSCULAR | Status: AC
  Filled 2011-09-15: qty 2

## 2011-09-15 NOTE — Progress Notes (Signed)
[facility]   Subjective: Examined post endoscopy. Patient states she still feels sedated so "some of my answers may sound strange". States has not had a bowel movement since admission. No abnormal pain endorsed. Denies chest pain or shortness of breath. Patient is continuing to complain of right arm pain with any movement. Has known supracondylar fracture of the humerus with an immobilizer device in place. Patient requests to contact Dr. Jonetta Osgood office to see if he or one of his associates can come by to evaluate the arm.  Objective: Vital signs in last 24 hours: Temp:  [97.9 F (36.6 C)-99.9 F (37.7 C)] 98.7 F (37.1 C) (01/17 1119) Pulse Rate:  [39-103] 78  (01/17 1119) Resp:  [9-23] 15  (01/17 1119) BP: (70-130)/(33-115) 106/43 mmHg (01/17 1119) SpO2:  [94 %-100 %] 100 % (01/17 1119) Weight:  [64 kg (141 lb 1.5 oz)] 64 kg (141 lb 1.5 oz) (01/17 0408) Weight change:  Last BM Date: 09/14/11  Intake/Output from previous day: 01/16 0701 - 01/17 0700 In: 1877.5 [I.V.:1165; Blood:712.5] Out: 2525 [Urine:2525] Intake/Output this shift: Total I/O In: 500 [I.V.:500] Out: 300 [Urine:300]  General appearance: alert, cooperative, appears stated age and no distress Resp: clear to auscultation bilaterally, currently on 2 L nasal cannula oxygen maintaining saturations of 94% Cardio: regular rate and rhythm, S1, S2 normal, no murmur, click, rub or gallop, ECG heart rates have been in the 70-80 range. Review of the pulse ranges listed on the flow chart show rates as low as 39. In the ER patient had rates as low as 57. IV fluids her 100 cc an hour GI: soft, non-tender; bowel sounds normal; no masses,  no organomegaly, has continuous Protonix IV infusion Extremities: extremities normal, atraumatic, no cyanosis or edema Neurologic: Grossly normal, CIWA score has been between 2 and 4 since admission  Lab Results:  Basename 09/15/11 0222 09/14/11 1116  WBC 6.3 10.9*  HGB 10.1* 8.3*    HCT 29.8* 25.0*  PLT 207 287   BMET  Basename 09/15/11 0222 09/14/11 1116  NA 141 136  K 3.3* 3.8  CL 109 102  CO2 24 21  GLUCOSE 114* 131*  BUN 50* 82*  CREATININE 0.72 0.69  CALCIUM 8.1* 8.6    Studies/Results: Dg Chest 2 View  09/14/2011  *RADIOLOGY REPORT*  Clinical Data: Syncope.  CHEST - 2 VIEW  Comparison: PA and lateral chest 04/25/2008 and single view of the chest 02/04/2011.  Findings: Lungs are clear.  Heart size is normal.  No pneumothorax or pleural effusion.  Compression fracture deformities are seen in the lower thoracic spine at T9 and T10.  Fractures were present on the most recent comparison study.  IMPRESSION: No acute finding.  Original Report Authenticated By: Bernadene Bell. Maricela Curet, M.D.    Medications:  I have reviewed the patient's current medications. Scheduled:   . acetaminophen  650 mg Oral Once  . diphenhydrAMINE  25 mg Intravenous Once  . folic acid  1 mg Oral Daily  . furosemide  20 mg Intravenous Once  . mulitivitamin with minerals  1 tablet Oral Daily  . pantoprazole (PROTONIX) IV  80 mg Intravenous STAT  . thiamine  100 mg Oral Daily   Or  . thiamine  100 mg Intravenous Daily  . timolol  1 drop Left Eye Daily  . white petrolatum        Assessment/Plan:  Principal Problem:  *GI bleeding/Prepyloric erosion Appreciate gastroenterology assistance. Upper endoscopy completed today only shows prepyloric erosion without any  explanation at this time for current bleeding. H. pylori is pending. We will continue proton pump inhibitor. Consideration should be given to lower GI source of bleeding. Since patient may undergo colonoscopy this admission I've only advance diet to clear liquids until further clarified with the gastroenterology team.  Active Problems:  Anemia associated with acute blood loss Hemoglobin has increased from 8-10 range after transfusion 2 units of  packed red blood cells. We'll follow CBC serially.   Tachycardia/  Bradycardia Patient has had tachycardia this admission which so far has stabilized. This was associated with low blood pressure was likely physiologic in nature. She also had heart rates documented as low as 39 but these may be inaccurate given her ECG heart rates have not been below the 70s. Will continue telemetry monitoring.    Anxiety/ETOH abuse Continue CIWA protocol. Currently no signs of alcohol withdrawal.   Supracondylar fracture of humerus, closed Patient endorsing significant pain in right arm. Has requested we notify her orthopedic physician. I did call Dr. Sherene Sires office and was directed to a voice mail box and message was left.    Hypotension due to blood loss Frank hypotension has resolved but blood pressure remained soft. We'll continue to follow.  Hypokalemia Likely due to Lasix given in the ER. We'll give a dose of oral potassium today and check a lateral panel in the morning.   Leukocytosis Improved. Possibly reactive.    Extrarenal azotemia BUN at presentation was 82 with a normal creatinine after admission an apparent cessation of bleeding as well as rehydration BUN is now down to 50. Suspect azotemia primarily due to bleeding and low perfusion with associated hypotension. Also degree of iron depletion which has now been corrected.   HTN (hypertension) Blood pressure is well-controlled and is actually saw for patient with underlying hypertension. Home thiazide diuretic as well as Norvasc remain on hold.   Disposition  Remain a stepdown    LOS: 1 day   Junious Silk, ANP pager [phone]  [facility] 8 [email or web address] Password: [email or web address]  09/15/2011, 2:03 PM  I have examined the patient and reviewed the chart. I agree with the above note.   Calvert Cantor, MD [phone]

## 2011-09-15 NOTE — Op Note (Signed)
[facility] [address] [place], [place]  [address]  ENDOSCOPY PROCEDURE REPORT  PATIENT:  Connie Wagner, Connie Wagner  MR#:  [id number] BIRTHDATE:  December 14, 1928, 82 yrs. old  GENDER:  female  ENDOSCOPIST:  Dorena Cookey Referred by:  PROCEDURE DATE:  09/15/2011 PROCEDURE: Esophagogastroduodenoscopy with ASA CLASS: INDICATIONS: No and anemia  MEDICATIONS:  Fentanyl 75 mcg, Versed 6 mg. TOPICAL ANESTHETIC:  DESCRIPTION OF PROCEDURE:   After the risks benefits and alternatives of the procedure were thoroughly explained, informed consent was obtained.  The Pentax Gastroscope [id number] endoscope was introduced through the mouth and advanced to the , without limitations.  The instrument was slowly withdrawn as the mucosa was fully examined. <<PROCEDUREIMAGES>>  FINDINGS: Small prepyloric erosion with deformity no definite ulcer otherwise normal study.  COMPLICATIONS: Normal  ENDOSCOPIC IMPRESSION: Small antral erosions no definite abnormality.  RECOMMENDATIONS: Avoid biopsy results, continue empiric PPI treatment, monitor stools and hemoglobin for possible need for colonoscopy.  REPEAT EXAM:  No  ______________________________ Dorena Cookey  CC:  n. eSIGNED:   Dorena Cookey at 09/15/2011 09:55 AM  Ozella Almond, [phone]

## 2011-09-15 NOTE — Progress Notes (Signed)
Voicemail message left for patient's daughter Connie Wagner informing her of plans for patient to go for EGD within the hour per patient request.

## 2011-09-15 NOTE — Progress Notes (Signed)
Received a call back from [place] with Dr. Jonetta Osgood office. She tells [place] the patient was just seen in the office yesterday prior to our most recent admission here. She also has recently been changed from a cast to a Bledsoe brace on 09/13/2011. Penni Homans also clarified patient's home medications and also reported that the patient had just received 30 of Norco at that most recent orthopedic visit. The following her other medications that the patient takes at home which was not listed on her medication reconciliation sheet: Mirtazapine 15 mg daily, Sertraline 25 mg daily, and Estrace cream. I will resume the first 2 medications we'll also ask the pharmacist to update these on the medication reconciliation sheet.  Junious Silk, ANP [phone]   I agree with the above.   Calvert Cantor, MD [phone]

## 2011-09-15 NOTE — Brief Op Note (Signed)
09/14/2011 - 09/15/2011  9:55 AM  PATIENT:  Connie Wagner  78 y.o. female  PRE-OPERATIVE DIAGNOSIS:  gi bleed  POST-OPERATIVE DIAGNOSIS:  * No post-op diagnosis entered *  PROCEDURE:  Procedure(s): ESOPHAGOGASTRODUODENOSCOPY (EGD)  SURGEON:  Surgeon(s): Barrie Folk, MD  Minimal prepyloric erosions with no obvious ulcer or stigmata of hemorrhage otherwise normal study. Please see full report

## 2011-09-15 NOTE — Progress Notes (Signed)
Utilization Review Completed.Connie Wagner T1/17/2013

## 2011-09-15 NOTE — Progress Notes (Signed)
[facility] Progress Note  Subjective: No new complaints Objective: Vital signs in last 24 hours: Temp:  [97.9 F (36.6 C)-99.9 F (37.7 C)] 98.7 F (37.1 C) (01/17 0749) Pulse Rate:  [39-133] 41  (01/17 0800) Resp:  [9-24] 9  (01/17 0800) BP: (70-132)/(33-95) 103/54 mmHg (01/17 0800) SpO2:  [95 %-100 %] 99 % (01/17 0800) Weight:  [64 kg (141 lb 1.5 oz)] 64 kg (141 lb 1.5 oz) (01/17 0408) Weight change:    PE: Unchanged  Lab Results: Results for orders placed during the hospital encounter of 09/14/11 (from the past 24 hour(s))  LACTIC ACID, PLASMA     Status: Normal   Collection Time   09/14/11 11:16 AM      Component Value Range   Lactic Acid, Venous 2.1  0.5 - 2.2 (mmol/L)  LIPASE, BLOOD     Status: Normal   Collection Time   09/14/11 11:16 AM      Component Value Range   Lipase 23  11 - 59 (U/L)  COMPREHENSIVE METABOLIC PANEL     Status: Abnormal   Collection Time   09/14/11 11:16 AM      Component Value Range   Sodium 136  135 - 145 (mEq/L)   Potassium 3.8  3.5 - 5.1 (mEq/L)   Chloride 102  96 - 112 (mEq/L)   CO2 21  19 - 32 (mEq/L)   Glucose, Bld 131 (*) 70 - 99 (mg/dL)   BUN 82 (*) 6 - 23 (mg/dL)   Creatinine, Ser 1.61  0.50 - 1.10 (mg/dL)   Calcium 8.6  8.4 - 09.6 (mg/dL)   Total Protein 6.6  6.0 - 8.3 (g/dL)   Albumin 3.1 (*) 3.5 - 5.2 (g/dL)   AST 18  0 - 37 (U/L)   ALT 12  0 - 35 (U/L)   Alkaline Phosphatase 67  39 - 117 (U/L)   Total Bilirubin 0.2 (*) 0.3 - 1.2 (mg/dL)   GFR calc non Af Amer 80 (*) >90 (mL/min)   GFR calc Af Amer >90  >90 (mL/min)  CBC     Status: Abnormal   Collection Time   09/14/11 11:16 AM      Component Value Range   WBC 10.9 (*) 4.0 - 10.5 (K/uL)   RBC 2.79 (*) 3.87 - 5.11 (MIL/uL)   Hemoglobin 8.3 (*) 12.0 - 15.0 (g/dL)   HCT 04.5 (*) 40.9 - 46.0 (%)   MCV 89.6  78.0 - 100.0 (fL)   MCH 29.7  26.0 - 34.0 (pg)   MCHC 33.2  30.0 - 36.0 (g/dL)   RDW 81.1  91.4 - 78.2 (%)   Platelets 287  150 - 400 (K/uL)    DIFFERENTIAL     Status: Abnormal   Collection Time   09/14/11 11:16 AM      Component Value Range   Neutrophils Relative 89 (*) 43 - 77 (%)   Neutro Abs 9.6 (*) 1.7 - 7.7 (K/uL)   Lymphocytes Relative 8 (*) 12 - 46 (%)   Lymphs Abs 0.8  0.7 - 4.0 (K/uL)   Monocytes Relative 3  3 - 12 (%)   Monocytes Absolute 0.4  0.1 - 1.0 (K/uL)   Eosinophils Relative 0  0 - 5 (%)   Eosinophils Absolute 0.0  0.0 - 0.7 (K/uL)   Basophils Relative 0  0 - 1 (%)   Basophils Absolute 0.0  0.0 - 0.1 (K/uL)  APTT     Status: Abnormal   Collection Time  09/14/11 11:16 AM      Component Value Range   aPTT 21 (*) 24 - 37 (seconds)  PROTIME-INR     Status: Normal   Collection Time   09/14/11 11:16 AM      Component Value Range   Prothrombin Time 14.3  11.6 - 15.2 (seconds)   INR 1.09  0.00 - 1.49   URINALYSIS, ROUTINE W REFLEX MICROSCOPIC     Status: Abnormal   Collection Time   09/14/11 11:30 AM      Component Value Range   Color, Urine YELLOW  YELLOW    APPearance CLEAR  CLEAR    Specific Gravity, Urine 1.016  1.005 - 1.030    pH 5.0  5.0 - 8.0    Glucose, UA NEGATIVE  NEGATIVE (mg/dL)   Hgb urine dipstick MODERATE (*) NEGATIVE    Bilirubin Urine NEGATIVE  NEGATIVE    Ketones, ur NEGATIVE  NEGATIVE (mg/dL)   Protein, ur NEGATIVE  NEGATIVE (mg/dL)   Urobilinogen, UA 0.2  0.0 - 1.0 (mg/dL)   Nitrite NEGATIVE  NEGATIVE    Leukocytes, UA SMALL (*) NEGATIVE   OCCULT BLOOD, POC DEVICE     Status: Normal   Collection Time   09/14/11 11:30 AM      Component Value Range   Fecal Occult Bld POSITIVE    URINE MICROSCOPIC-ADD ON     Status: Normal   Collection Time   09/14/11 11:30 AM      Component Value Range   WBC, UA 3-6  <3 (WBC/hpf)   RBC / HPF 3-6  <3 (RBC/hpf)   Bacteria, UA RARE  RARE   TYPE AND SCREEN     Status: Normal   Collection Time   09/14/11  3:04 PM      Component Value Range   ABO/RH(D) A POS     Antibody Screen NEG     Sample Expiration 09/17/2011     Unit Number [id number]      Blood Component Type RED CELLS,LR     Unit division 00     Status of Unit ISSUED,FINAL     Transfusion Status OK TO TRANSFUSE     Crossmatch Result Compatible     Unit Number [id number]     Blood Component Type RED CELLS,LR     Unit division 00     Status of Unit ISSUED,FINAL     Transfusion Status OK TO TRANSFUSE     Crossmatch Result Compatible    ABO/RH     Status: Normal   Collection Time   09/14/11  3:04 PM      Component Value Range   ABO/RH(D) A POS    PREPARE RBC (CROSSMATCH)     Status: Normal   Collection Time   09/14/11  3:22 PM      Component Value Range   Order Confirmation ORDER PROCESSED BY BLOOD BANK    MRSA PCR SCREENING     Status: Normal   Collection Time   09/14/11  5:26 PM      Component Value Range   MRSA by PCR NEGATIVE  NEGATIVE   CBC     Status: Abnormal   Collection Time   09/15/11  2:22 AM      Component Value Range   WBC 6.3  4.0 - 10.5 (K/uL)   RBC 3.31 (*) 3.87 - 5.11 (MIL/uL)   Hemoglobin 10.1 (*) 12.0 - 15.0 (g/dL)   HCT 29.5 (*) 28.4 - 46.0 (%)   MCV  90.0  78.0 - 100.0 (fL)   MCH 30.5  26.0 - 34.0 (pg)   MCHC 33.9  30.0 - 36.0 (g/dL)   RDW 21.3  08.6 - 57.8 (%)   Platelets 207  150 - 400 (K/uL)  COMPREHENSIVE METABOLIC PANEL     Status: Abnormal   Collection Time   09/15/11  2:22 AM      Component Value Range   Sodium 141  135 - 145 (mEq/L)   Potassium 3.3 (*) 3.5 - 5.1 (mEq/L)   Chloride 109  96 - 112 (mEq/L)   CO2 24  19 - 32 (mEq/L)   Glucose, Bld 114 (*) 70 - 99 (mg/dL)   BUN 50 (*) 6 - 23 (mg/dL)   Creatinine, Ser 4.69  0.50 - 1.10 (mg/dL)   Calcium 8.1 (*) 8.4 - 10.5 (mg/dL)   Total Protein 5.6 (*) 6.0 - 8.3 (g/dL)   Albumin 2.8 (*) 3.5 - 5.2 (g/dL)   AST 17  0 - 37 (U/L)   ALT 10  0 - 35 (U/L)   Alkaline Phosphatase 56  39 - 117 (U/L)   Total Bilirubin 0.3  0.3 - 1.2 (mg/dL)   GFR calc non Af Amer 79 (*) >90 (mL/min)   GFR calc Af Amer >90  >90 (mL/min)    Studies/Results: EGD reveals a small prepyloric  erosion  Assessment: No obvious source of bleeding by EGD Plan: Continue proton pump inhibitor await h pylori testing, consider lower GI source of bleeding.    Connie Wagner C 09/15/2011, 9:59 AM

## 2011-09-16 LAB — BASIC METABOLIC PANEL
Calcium: 8.1 mg/dL — ABNORMAL LOW (ref 8.4–10.5)
GFR calc non Af Amer: 79 mL/min — ABNORMAL LOW (ref >90)
Glucose, Bld: 102 mg/dL — ABNORMAL HIGH (ref 70–99)
Sodium: 142 mEq/L (ref 135–145)

## 2011-09-16 LAB — CBC
MCH: 30.8 pg (ref 26.0–34.0)
MCV: 93.3 fL (ref 78.0–100.0)
Platelets: 176 10*3/uL (ref 150–400)
RBC: 3.12 MIL/uL — ABNORMAL LOW (ref 3.87–5.11)
RDW: 15.1 % (ref 11.5–15.5)
WBC: 5 10*3/uL (ref 4.0–10.5)

## 2011-09-16 LAB — DIFFERENTIAL
Basophils Absolute: 0.1 10*3/uL (ref 0.0–0.1)
Basophils Relative: 1 % (ref 0–1)
Eosinophils Absolute: 0.3 10*3/uL (ref 0.0–0.7)
Eosinophils Relative: 6 % — ABNORMAL HIGH (ref 0–5)
Neutrophils Relative %: 51 % (ref 43–77)

## 2011-09-16 LAB — GLUCOSE, CAPILLARY: Glucose-Capillary: 107 mg/dL — ABNORMAL HIGH (ref 70–99)

## 2011-09-16 MED ORDER — OXYCODONE HCL 5 MG PO TABS
5.0000 mg | ORAL_TABLET | ORAL | Status: DC | PRN
  Administered 2011-09-16 – 2011-09-18 (×11): 5 mg via ORAL
  Filled 2011-09-16 (×7): qty 1
  Filled 2011-09-16: qty 5
  Filled 2011-09-16 (×4): qty 1

## 2011-09-16 MED ORDER — PANTOPRAZOLE SODIUM 40 MG PO TBEC
40.0000 mg | DELAYED_RELEASE_TABLET | Freq: Two times a day (BID) | ORAL | Status: DC
  Administered 2011-09-16 – 2011-09-18 (×4): 40 mg via ORAL
  Filled 2011-09-16 (×4): qty 1

## 2011-09-16 NOTE — Progress Notes (Signed)
Clinical social worker received referral that patient was from assisted living, however per chart review and rn case manager, pt is from independent living and plans on returning home. No further CSW needs, please consult if further needs.   Catha Gosselin, Theresia Majors  [phone] .09/16/2011 17:13pm

## 2011-09-16 NOTE — Progress Notes (Signed)
SPOKE WITH THE PT AND SHE STATES THAT SHE IS FROM [facility].  SHE SAID THAT SHE HAS A PERSONAL AIDE THAT COMES IN FOR A FEW HRS 5 DAYS A WEEK MAINLY FOR BATHING.   Connie Wagner [phone] OR [phone] 09/16/2011

## 2011-09-16 NOTE — Progress Notes (Signed)
[facility]   Subjective: Patient stay she feels much better today with her only complaint being of uncontrolled right elbow pain from previous fracture. Denies any further up her lower bleeding symptoms. Denies abdominal pain. Discussed with her plans to adjust pain medications. Also aware she may remain in the hospital over the weekend to ensure she has not had any recurrent bleeding. Discussed with her if no indications for colonoscopy this admission was advanced her diet  Objective: Vital signs in last 24 hours: Temp:  [97.8 F (36.6 C)-100.4 F (38 C)] 98 F (36.7 C) (01/18 0820) Pulse Rate:  [69-82] 69  (01/18 0820) Resp:  [12-17] 13  (01/18 0419) BP: (90-146)/(36-120) 109/89 mmHg (01/18 0820) SpO2:  [96 %-100 %] 100 % (01/18 0820) Weight change:  Last BM Date: 09/14/11  Intake/Output from previous day: 01/17 0701 - 01/18 0700 In: 2670 [P.O.:420; I.V.:2250] Out: 775 [Urine:775] Intake/Output this shift:    General appearance: alert, cooperative, appears stated age and no distress Resp: clear to auscultation bilaterally, currently on room air maintaining O2 saturations 100% Cardio: regular rate and rhythm, S1, S2 normal, no murmur, click, rub or gallop GI: soft, non-tender; bowel sounds normal; no masses,  no organomegaly, has continuous Protonix IV infusion Extremities: extremities normal, atraumatic, no cyanosis or edema Neurologic: Grossly normal, CIWA score has been between 2 and 4 since admission  Lab Results:  Basename 09/16/11 0442 09/15/11 1522 09/15/11 0222  WBC 5.0 -- 6.3  HGB 9.6* 10.3*9.7* --  HCT 29.1* 30.7*29.1* --  PLT 176 -- 207   BMET  Basename 09/16/11 0442 09/15/11 0222  NA 142 141  K 3.9 3.3*  CL 113* 109  CO2 22 24  GLUCOSE 102* 114*  BUN 22 50*  CREATININE 0.72 0.72  CALCIUM 8.1* 8.1*    Studies/Results: Dg Chest 2 View  09/14/2011  *RADIOLOGY REPORT*  Clinical Data: Syncope.  CHEST - 2 VIEW  Comparison: PA and lateral  chest 04/25/2008 and single view of the chest 02/04/2011.  Findings: Lungs are clear.  Heart size is normal.  No pneumothorax or pleural effusion.  Compression fracture deformities are seen in the lower thoracic spine at T9 and T10.  Fractures were present on the most recent comparison study.  IMPRESSION: No acute finding.  Original Report Authenticated By: Bernadene Bell. Maricela Curet, M.D.    Medications:  I have reviewed the patient's current medications. Scheduled:    . acetaminophen  650 mg Oral Once  . diphenhydrAMINE  25 mg Intravenous Once  . folic acid  1 mg Oral Daily  . mirtazapine  15 mg Oral QHS  . mulitivitamin with minerals  1 tablet Oral Daily  . pantoprazole  40 mg Oral BID AC  . potassium chloride  20 mEq Oral Once  . sertraline  25 mg Oral Daily  . thiamine  100 mg Oral Daily   Or  . thiamine  100 mg Intravenous Daily  . timolol  1 drop Left Eye Daily    Assessment/Plan:  Principal Problem:  *GI bleeding/Prepyloric erosion Appreciate gastroenterology assistance. Upper endoscopy completed today only shows prepyloric erosion without any explanation at this time for current bleeding. H. pylori is pending. We will continue proton pump inhibitor but changed from continuous infusion to twice a day oral route. Discuss with Dr. Madilyn Fireman with gastroenterology and at the present time no indication to proceed with colonoscopy this admission. He does wish to see the patient in the office in 3 weeks after discharge. We'll advance diet  to carbohydrate modified. Will need to continue Protonix after discharge.  Active Problems:  Anemia associated with acute blood loss Hemoglobin has increased from 8-10 range after transfusion 2 units of  packed red blood cells. We'll follow CBC serially. Hemoglobin has drifted down slowly overnight but not enough to consider ongoing bleeding.   Tachycardia/ Bradycardia Patient has had tachycardia this admission which so far has stabilized. This was associated  with low blood pressure was likely physiologic in nature. She also had heart rates documented as low as 39 but these may be inaccurate given her ECG heart rates have not been below the 70s. Will continue telemetry monitoring. Based on continued monitoring he'll be pulse rate that was lower than the ECG rate was spurious based on abnormality with Dinamap machine.    Anxiety/ETOH abuse Continue CIWA protocol. Currently no signs of alcohol withdrawal.   Supracondylar fracture of humerus, closed Patient endorsing significant pain in right arm. Has requested we notify her orthopedic physician. I did call Dr. Sherene Sires office and discussed the case with Penni Homans the physician assistant who knows this patient well. She was recently in the office 24 hours prior to this admission and was changed from a cast to a Bledsoe brace and was given 30 tablets of Norco to take for pain. Patient continues to endorse uncontrolled pain so we'll change the Vicodin/Norco to oxycodone and Avastin nurses to give Tylenol with this as well I will be given 10 mg every 4 hours. Avoid NSAIDs due to prepyloric ulcer and recent unexplained GI bleeding.    Hypotension due to blood loss RESOLVED  Frank hypotension has resolved but blood pressure remained soft. We'll continue to follow.  Hypokalemia RESOLVED  Likely due to Lasix given in the ER. Was given oral potassium 09/15/2011 and potassium today is 3.9.    Leukocytosis RESOLVED   Possibly reactive.    Extrarenal azotemia RESOLVED  BUN at presentation was 82 with a normal creatinine after admission an apparent cessation of bleeding as well as rehydration BUN  has further tended downward to 50 and as of today down to 22. Suspect azotemia primarily due to bleeding and low perfusion with associated hypotension. Also degree of volume depletion which has now been corrected. We'll stop  IVF and encourage PO fluid intake. This will allow her to walk around more.   HTN Blood pressure  is well-controlled and is actually soft for patient with underlying hypertension. Home thiazide diuretic as well as Norvasc remain on hold.   Disposition  Remain a stepdown    LOS: 2 days   Junious Silk, ANP pager [phone]  [facility] 8 [email or web address] Password: [email or web address]  09/16/2011, 10:26 AM  I have examined the patient and reviewed the chart. I agree with the above note.   Calvert Cantor, MD [phone]

## 2011-09-16 NOTE — Progress Notes (Signed)
Received report from [place], [place] on 6500. Pt received on 5100 and oriented to unit and safety plan. Reviewed plan of care. VSS. Irena Reichmann 09/16/2011

## 2011-09-17 LAB — BASIC METABOLIC PANEL
Calcium: 8.9 mg/dL (ref 8.4–10.5)
Chloride: 107 mEq/L (ref 96–112)
Creatinine, Ser: 0.62 mg/dL (ref 0.50–1.10)
GFR calc Af Amer: >90 mL/min (ref >90)
GFR calc non Af Amer: 83 mL/min — ABNORMAL LOW (ref >90)

## 2011-09-17 LAB — CBC
MCV: 95.1 fL (ref 78.0–100.0)
Platelets: 192 10*3/uL (ref 150–400)
RDW: 15 % (ref 11.5–15.5)
WBC: 4.9 10*3/uL (ref 4.0–10.5)

## 2011-09-17 LAB — GLUCOSE, CAPILLARY: Glucose-Capillary: 103 mg/dL — ABNORMAL HIGH (ref 70–99)

## 2011-09-17 MED ORDER — POLYETHYLENE GLYCOL 3350 17 G PO PACK
17.0000 g | PACK | Freq: Every day | ORAL | Status: DC
  Administered 2011-09-17 – 2011-09-18 (×2): 17 g via ORAL
  Filled 2011-09-17 (×2): qty 1

## 2011-09-17 NOTE — Progress Notes (Signed)
PT DISCHARGE NOTE:  PT evaluation completed.  Pt is performing at/near baseline level of function and presents with no further acute PT needs.   Acute PT signing off.  Discussed plan with pt/family and they agree.    Ryelle Ruvalcaba L. Gloyd Happ DPT [phone] 09/17/2011

## 2011-09-17 NOTE — Progress Notes (Signed)
Physical Therapy Evaluation Patient Details Name: Connie Wagner MRN: [id number] DOB: 02/18/1929 Today's Date: 09/17/2011  Problem List:  Patient Active Problem List  Diagnoses  . GI bleeding  . Anemia associated with acute blood loss  . Tachycardia  . HTN (hypertension)  . Anxiety  . ETOH abuse  . Supracondylar fracture of humerus, closed  . Prepyloric ulcer  . Hypotension due to blood loss  . Leukocytosis  . Extrarenal azotemia    Past Medical History:  Past Medical History  Diagnosis Date  . Hypertension   . Blood transfusion   . Anemia   . Upper GI bleeding 09/14/11    "have had it once before today"  . Migraines     "haven't had one since going thru menopause"  . Depression   . Anxiety   . Rheumatoid arthritis   . Chronic lower back pain   . Hx: UTI (urinary tract infection)     "every now and then"   Past Surgical History:  Past Surgical History  Procedure Date  . Back surgery   . Joint replacement   . Lumbar disc surgery 09/2002    Hemilaminectomy, L3, complete laminectomy, L4, hemi- semilaminectomy, L5 and right S1, and diskectomy, L5-S1.  Marland Kitchen Total knee arthroplasty 01/2002    right  . Incision and drainage of wound 10/2002    lumbar wound infection  . Knee arthroscopy 01/2001    right  . Hematoma evacuation 10/2002    lumbar  . Lumbar laminectomy 10/2003    for epidural abscess evacuation; L4-5  . Breast lumpectomy     "benign tumor removed"    PT Assessment/Plan/Recommendation PT Assessment Clinical Impression Statement: Pt demonstrates independence with all mobility and presents with no further acute PT needs.  PT Recommendation/Assessment: Patent does not need any further PT services Barriers to Discharge: None No Skilled PT: All education completed;Patient is independent with all acitivity/mobility PT Recommendation Follow Up Recommendations: No PT follow up Equipment Recommended: None recommended by PT PT Goals  Acute Rehab PT Goals PT Goal  Formulation: With patient  PT Evaluation Precautions/Restrictions  Precautions Precautions: Other (comment) Precaution Comments: NO ROM with Right UE at ELBOW Required Braces or Orthoses: Yes Other Brace/Splint: Hinged elbow spint locked in elbow flexion Prior Functioning  Home Living Lives With: Alone Receives Help From: Personal care attendant Type of Home: Apartment Home Layout: One level Home Access: Level entry Bathroom Shower/Tub: Walk-in shower;Curtain Bathroom Toilet: Handicapped height Bathroom Accessibility: Yes How Accessible: Accessible via wheelchair;Accessible via walker Prior Function Level of Independence: Independent with homemaking with ambulation;Independent with gait;Needs assistance with ADLs;Independent with transfers Driving: No Vocation: Retired Leisure: Hobbies-no Cognition Cognition Arousal/Alertness: Awake/alert Overall Cognitive Status: Appears within functional limits for tasks assessed Orientation Level: Oriented X4 Sensation/Coordination Sensation Light Touch: Appears Intact Stereognosis: Not tested Hot/Cold: Not tested Proprioception: Appears Intact Coordination Gross Motor Movements are Fluid and Coordinated: Yes Fine Motor Movements are Fluid and Coordinated: Not tested Extremity Assessment RUE Assessment RUE Assessment: Not tested LUE Assessment LUE Assessment: Not tested RLE Assessment RLE Assessment: Within Functional Limits LLE Assessment LLE Assessment: Within Functional Limits Mobility (including Balance) Bed Mobility Bed Mobility: Yes Supine to Sit: 7: Independent;HOB flat Sit to Supine: 7: Independent;HOB flat Transfers Transfers: Yes Sit to Stand: 7: Independent;From bed;Without upper extremity assist Stand to Sit: 7: Independent;To bed;Without upper extremity assist Stand Pivot Transfers: 7: Independent Ambulation/Gait Ambulation/Gait: Yes Ambulation/Gait Assistance: 7: Independent Ambulation Distance (Feet): 200  Feet Assistive device: None Gait Pattern: Within Functional  Limits Stairs: No Wheelchair Mobility Wheelchair Mobility: No  Posture/Postural Control Posture/Postural Control: No significant limitations Balance Balance Assessed: Yes Dynamic Gait Index Level Surface: Normal Change in Gait Speed: Normal Gait with Horizontal Head Turns: Mild Impairment Gait with Vertical Head Turns: Normal Gait and Pivot Turn: Normal Step Over Obstacle: Normal Step Around Obstacles: Normal Steps: Normal Total Score: 23  Exercise    End of Session PT - End of Session Equipment Utilized During Treatment: Gait belt Activity Tolerance: Patient tolerated treatment well Patient left: in bed;with call bell in reach (MD with pt) Nurse Communication: Mobility status for transfers;Mobility status for ambulation General Behavior During Session: [facility] for tasks performed Cognition: [facility] for tasks performed  Breyden Jeudy 09/17/2011, 2:45 PM Burton Gahan L. Twylah Bennetts DPT [phone]

## 2011-09-17 NOTE — Progress Notes (Signed)
[facility]   Subjective: She is eating but has a poor appetite. She has not had a BM yet. She states her arm pain is well controlled on the current medication.  Objective: Vital signs in last 24 hours: Temp:  [97.8 F (36.6 C)-98.4 F (36.9 C)] 98.3 F (36.8 C) (01/19 0545) Pulse Rate:  [61-77] 68  (01/19 0545) Resp:  [14-16] 16  (01/19 0545) BP: (109-135)/(42-61) 125/47 mmHg (01/19 0545) SpO2:  [97 %-100 %] 100 % (01/19 0545) Weight change:  Last BM Date: 09/14/11  Intake/Output from previous day: 01/18 0701 - 01/19 0700 In: -  Out: 1050 [Urine:1050] Intake/Output this shift: Total I/O In: -  Out: 900 [Urine:900]  General appearance: alert, cooperative, appears stated age and no distress Resp: clear to auscultation bilaterally, currently on room air maintaining O2 saturations 100% Cardio: regular rate and rhythm, S1, S2 normal, no murmur, click, rub or gallop GI: soft, non-tender; bowel sounds normal; no masses,  no organomegaly Extremities: extremities normal, atraumatic, no cyanosis or edema Neurologic: Grossly normal  Lab Results:  [facility] 09/17/11 0709 09/16/11 0442  WBC 4.9 5.0  HGB 10.0* 9.6*  HCT 31.2* 29.1*  PLT 192 176   BMET  Basename 09/17/11 0709 09/16/11 0442  NA 139 142  K 4.2 3.9  CL 107 113*  CO2 27 22  GLUCOSE 108* 102*  BUN 11 22  CREATININE 0.62 0.72  CALCIUM 8.9 8.1*    Studies/Results: No results found.  Medications:  I have reviewed the patient's current medications. Scheduled:    . acetaminophen  650 mg Oral Once  . diphenhydrAMINE  25 mg Intravenous Once  . folic acid  1 mg Oral Daily  . mirtazapine  15 mg Oral QHS  . mulitivitamin with minerals  1 tablet Oral Daily  . pantoprazole  40 mg Oral BID AC  . polyethylene glycol  17 g Oral Daily  . sertraline  25 mg Oral Daily  . thiamine  100 mg Oral Daily   Or  . thiamine  100 mg Intravenous Daily  . timolol  1 drop Left Eye Daily     Assessment/Plan:  Principal Problem:  *GI bleeding/Prepyloric erosion Appreciate gastroenterology assistance. Upper endoscopy completed today only shows prepyloric erosion without any explanation at this time for current bleeding. H. pylori is pending. We will continue proton pump inhibitor but changed from continuous infusion to twice a day oral route. Discuss with Dr. Madilyn Fireman with gastroenterology and at the present time no indication to proceed with colonoscopy this admission. He does wish to see the patient in the office in 3 weeks after discharge. Will need to continue Protonix after discharge. Have advanced diet. She has not had a BM yet. Will give her Miralax.   Active Problems:  Anemia associated with acute blood loss Hemoglobin has increased from 8-10 range after transfusion 2 units of  packed red blood cells. We'll follow CBC serially. Hemoglobin is stable.    Tachycardia/ Bradycardia Patient has had tachycardia this admission which so far has stabilized. This was associated with low blood pressure was likely physiologic in nature. She also had heart rates documented as low as 39 but these may be inaccurate given her ECG heart rates have not been below the 70s. Will continue telemetry monitoring. Based on continued monitoring he'll be pulse rate that was lower than the ECG rate was spurious based on abnormality with Dinamap machine.    Anxiety/ETOH abuse Continue CIWA protocol. Currently no signs of alcohol withdrawal.  Supracondylar fracture of humerus, closed Patient endorsing significant pain in right arm. Has requested we notify her orthopedic physician. I did call Dr. Sherene Sires office and discussed the case with Penni Homans the physician assistant who knows this patient well. She was recently in the office 24 hours prior to this admission and was changed from a cast to a Bledsoe brace and was given 30 tablets of Norco to take for pain. Patient continues to endorse uncontrolled pain  so we'll change the Vicodin/Norco to oxycodone and Avastin nurses to give Tylenol with this as well I will be given 10 mg every 4 hours. Avoid NSAIDs due to prepyloric ulcer and recent unexplained GI bleeding.    Hypotension due to blood loss RESOLVED  Frank hypotension has resolved but blood pressure remained soft. We'll continue to follow.  Hypokalemia RESOLVED  Likely due to Lasix given in the ER.    Leukocytosis RESOLVED   Possibly reactive.    Extrarenal azotemia RESOLVED  BUN at presentation was 82 with a normal creatinine after admission an apparent cessation of bleeding as well as rehydration BUN  has further tended downward to 50 and as of today down to 22. Suspect azotemia primarily due to bleeding and low perfusion with associated hypotension. Also degree of volume depletion which has now been corrected. We'll stop  IVF and encourage PO fluid intake. This will allow her to walk around more.   HTN Blood pressure is well-controlled and is actually soft for patient with underlying hypertension. Home thiazide diuretic as well as Norvasc remain on hold.  Calvert Cantor, MD [phone]   LOS: 3 days   09/17/2011, 1:13 PM

## 2011-09-17 NOTE — Progress Notes (Signed)
[facility] Progress Note  Subjective: Feeling fine, wants to go home, no bowel movements in 2 days  Objective: Vital signs in last 24 hours: Temp:  [97.8 F (36.6 C)-98.4 F (36.9 C)] 98.3 F (36.8 C) (01/19 0545) Pulse Rate:  [61-77] 68  (01/19 0545) Resp:  [14-16] 16  (01/19 0545) BP: (109-135)/(42-61) 125/47 mmHg (01/19 0545) SpO2:  [97 %-100 %] 100 % (01/19 0545) Weight change:    PE: Unchanged  Lab Results: Results for orders placed during the hospital encounter of 09/14/11 (from the past 24 hour(s))  CBC     Status: Abnormal   Collection Time   09/17/11  7:09 AM      Component Value Range   WBC 4.9  4.0 - 10.5 (K/uL)   RBC 3.28 (*) 3.87 - 5.11 (MIL/uL)   Hemoglobin 10.0 (*) 12.0 - 15.0 (g/dL)   HCT 96.2 (*) 95.2 - 46.0 (%)   MCV 95.1  78.0 - 100.0 (fL)   MCH 30.5  26.0 - 34.0 (pg)   MCHC 32.1  30.0 - 36.0 (g/dL)   RDW 84.1  32.4 - 40.1 (%)   Platelets 192  150 - 400 (K/uL)  BASIC METABOLIC PANEL     Status: Abnormal   Collection Time   09/17/11  7:09 AM      Component Value Range   Sodium 139  135 - 145 (mEq/L)   Potassium 4.2  3.5 - 5.1 (mEq/L)   Chloride 107  96 - 112 (mEq/L)   CO2 27  19 - 32 (mEq/L)   Glucose, Bld 108 (*) 70 - 99 (mg/dL)   BUN 11  6 - 23 (mg/dL)   Creatinine, Ser 0.27  0.50 - 1.10 (mg/dL)   Calcium 8.9  8.4 - 25.3 (mg/dL)   GFR calc non Af Amer 83 (*) >90 (mL/min)   GFR calc Af Amer >90  >90 (mL/min)  GLUCOSE, CAPILLARY     Status: Abnormal   Collection Time   09/17/11  7:21 AM      Component Value Range   Glucose-Capillary 103 (*) 70 - 99 (mg/dL)   Comment 1 Notify RN      Studies/Results: Antral biopsies negative for H. pylori.   Assessment: Anemia and heme positive stools with small gastric ulcer. Unclear when last colonoscopy was done if ever  Plan: Patient states she is to be discharged tomorrow. I will followup with her in the office in a few weeks and determine whether she needs colonoscopy.    Connie Wagner  C 09/17/2011, 11:59 AM

## 2011-09-18 LAB — CBC
MCH: 30.8 pg (ref 26.0–34.0)
MCHC: 32.8 g/dL (ref 30.0–36.0)
Platelets: 190 10*3/uL (ref 150–400)
RBC: 3.38 MIL/uL — ABNORMAL LOW (ref 3.87–5.11)
RDW: 14.8 % (ref 11.5–15.5)

## 2011-09-18 LAB — GLUCOSE, CAPILLARY: Glucose-Capillary: 107 mg/dL — ABNORMAL HIGH (ref 70–99)

## 2011-09-18 MED ORDER — ACETAMINOPHEN 325 MG PO TABS: 650.0000 mg | ORAL_TABLET | ORAL | Status: DC | PRN

## 2011-09-18 MED ORDER — BACLOFEN 10 MG PO TABS: 10.0000 mg | ORAL_TABLET | Freq: Three times a day (TID) | ORAL | Status: DC | PRN

## 2011-09-18 MED ORDER — POLYETHYLENE GLYCOL 3350 17 G PO PACK: 17.0000 g | PACK | Freq: Two times a day (BID) | ORAL | Status: DC | PRN

## 2011-09-18 MED ORDER — BISACODYL 10 MG RE SUPP: 10.0000 mg | Freq: Every day | RECTAL | Status: DC

## 2011-09-18 MED ORDER — PANTOPRAZOLE SODIUM 40 MG PO TBEC: 40.0000 mg | DELAYED_RELEASE_TABLET | Freq: Two times a day (BID) | ORAL | Status: DC

## 2011-09-18 MED ORDER — OXYCODONE HCL 5 MG PO TABS: 5.0000 mg | ORAL_TABLET | ORAL | Status: DC | PRN

## 2011-09-18 NOTE — Progress Notes (Signed)
Home care instructions gone over including s/s of anemia, low sodium heart healthy diet, and increasing activity slowly. Follow up appointments to be made. Home medicines gone over and patient instructed to avoid NSAIDS due to increased risk of bleeding. Patient verbalized understanding of instructions and was given two prescriptions at discharge.

## 2011-09-19 ENCOUNTER — Encounter (HOSPITAL_COMMUNITY): Payer: Self-pay | Admitting: Gastroenterology

## 2011-09-20 ENCOUNTER — Other Ambulatory Visit: Payer: Self-pay

## 2011-09-20 ENCOUNTER — Emergency Department (HOSPITAL_COMMUNITY)
Admission: EM | Admit: 2011-09-20 | Discharge: 2011-09-20 | Disposition: A | Discharge status: Home / Self Care | Payer: Medicare Other | Attending: Emergency Medicine | Admitting: Emergency Medicine

## 2011-09-20 ENCOUNTER — Encounter (HOSPITAL_COMMUNITY): Payer: Self-pay | Admitting: *Deleted

## 2011-09-20 DIAGNOSIS — R42 Dizziness and giddiness: Secondary | ICD-10-CM | POA: Insufficient documentation

## 2011-09-20 DIAGNOSIS — K922 Gastrointestinal hemorrhage, unspecified: Secondary | ICD-10-CM

## 2011-09-20 DIAGNOSIS — R195 Other fecal abnormalities: Secondary | ICD-10-CM | POA: Insufficient documentation

## 2011-09-20 DIAGNOSIS — Z79899 Other long term (current) drug therapy: Secondary | ICD-10-CM | POA: Insufficient documentation

## 2011-09-20 DIAGNOSIS — F329 Major depressive disorder, single episode, unspecified: Secondary | ICD-10-CM | POA: Insufficient documentation

## 2011-09-20 DIAGNOSIS — M069 Rheumatoid arthritis, unspecified: Secondary | ICD-10-CM | POA: Insufficient documentation

## 2011-09-20 DIAGNOSIS — F411 Generalized anxiety disorder: Secondary | ICD-10-CM | POA: Insufficient documentation

## 2011-09-20 DIAGNOSIS — I10 Essential (primary) hypertension: Secondary | ICD-10-CM | POA: Insufficient documentation

## 2011-09-20 DIAGNOSIS — F3289 Other specified depressive episodes: Secondary | ICD-10-CM | POA: Insufficient documentation

## 2011-09-20 LAB — CBC
HCT: 32 % — ABNORMAL LOW (ref 36.0–46.0)
MCH: 30.5 pg (ref 26.0–34.0)
MCV: 92 fL (ref 78.0–100.0)
RBC: 3.48 MIL/uL — ABNORMAL LOW (ref 3.87–5.11)
WBC: 7.3 10*3/uL (ref 4.0–10.5)

## 2011-09-20 LAB — COMPREHENSIVE METABOLIC PANEL
AST: 21 U/L (ref 0–37)
BUN: 13 mg/dL (ref 6–23)
CO2: 28 mEq/L (ref 19–32)
Calcium: 9 mg/dL (ref 8.4–10.5)
Chloride: 99 mEq/L (ref 96–112)
Creatinine, Ser: 0.82 mg/dL (ref 0.50–1.10)
GFR calc Af Amer: 76 mL/min — ABNORMAL LOW (ref >90)
GFR calc non Af Amer: 66 mL/min — ABNORMAL LOW (ref >90)
Glucose, Bld: 109 mg/dL — ABNORMAL HIGH (ref 70–99)
Total Bilirubin: 0.2 mg/dL — ABNORMAL LOW (ref 0.3–1.2)

## 2011-09-20 LAB — OCCULT BLOOD, POC DEVICE: Fecal Occult Bld: POSITIVE

## 2011-09-20 NOTE — ED Provider Notes (Signed)
Medical screening examination/treatment/procedure(s) were conducted as a shared visit with non-physician practitioner(s) and myself.  I personally evaluated the patient during the encounter Elderly F recently admitted for melena now presenting after her first bowel movement since d/c (melena again).  Hgb improved, and HD stable.  She was in no distress on my eval.  Case d/w her PMD.  D/C home in stable condition.  Gerhard Munch, MD 09/20/11 1530

## 2011-09-20 NOTE — ED Provider Notes (Signed)
History     CSN: [id number]  Arrival date & time 09/20/11  1100   First MD Initiated Contact with Patient 09/20/11 1225      Chief Complaint  Patient presents with  . Melena  . Dizziness     The history is provided by the patient and medical records.   the patient is an 78 year old female with a recent history of GI bleed from an unknown source that required a blood transfusion who was discharged from the hospital several days ago and presents today with complaint of "black stools". She reports to me that upon her discharge, she was encouraged to return to the emergency department if she noticed any black, tarry stools. She had constipation after her discharge and took a stool softener last night with a large bowel movement this morning. She has had some mild associated lightheadedness when standing for prolonged periods. She denies any fever, chills, syncope, chest pain, shortness of breath, abdominal pain, nausea, vomiting, weakness. Denies any recent alcohol consumption. There has been no prior treatment before my assessment.  Prior records reviewed indicate as mentioned above a GI bleed from an unknown source. Upper endoscopy was performed during her recent hospital stay with no identified bleeding source and a future colonoscopy was discussed but not performed. The patient tells me that she has been advised to followup with a gastroenterologist for colonoscopy on an outpatient basis.  Past Medical History  Diagnosis Date  . Hypertension   . Blood transfusion   . Anemia   . Upper GI bleeding 09/14/11    "have had it once before today"  . Migraines     "haven't had one since going thru menopause"  . Depression   . Anxiety   . Rheumatoid arthritis   . Chronic lower back pain   . Hx: UTI (urinary tract infection)     "every now and then"    Past Surgical History  Procedure Date  . Back surgery   . Joint replacement   . Lumbar disc surgery 09/2002    Hemilaminectomy, L3,  complete laminectomy, L4, hemi- semilaminectomy, L5 and right S1, and diskectomy, L5-S1.  Marland Kitchen Total knee arthroplasty 01/2002    right  . Incision and drainage of wound 10/2002    lumbar wound infection  . Knee arthroscopy 01/2001    right  . Hematoma evacuation 10/2002    lumbar  . Lumbar laminectomy 10/2003    for epidural abscess evacuation; L4-5  . Breast lumpectomy     "benign tumor removed"  . Esophagogastroduodenoscopy 09/15/2011    Procedure: ESOPHAGOGASTRODUODENOSCOPY (EGD);  Surgeon: Barrie Folk, MD;  Location: [facility] ENDOSCOPY;  Service: Endoscopy;  Laterality: N/A;    Family History  Problem Relation Age of Onset  . Anesthesia problems Neg Hx   . Hypotension Neg Hx   . Malignant hyperthermia Neg Hx   . Pseudochol deficiency Neg Hx     History  Substance Use Topics  . Smoking status: Never Smoker   . Smokeless tobacco: Never Used  . Alcohol Use: 3.0 oz/week    5 Glasses of wine per week     Review of Systems 10 systems reviewed and are negative for acute change except as noted in the HPI.  Allergies  Penicillins and Sulfa drugs cross reactors  Home Medications   Current Outpatient Rx  Name Route Sig Dispense Refill  . ACETAMINOPHEN 325 MG PO TABS Oral Take 650 mg by mouth every 4 (four) hours as needed. For pain    .  AMLODIPINE BESYLATE 5 MG PO TABS Oral Take 5 mg by mouth daily.      Marland Kitchen BACLOFEN 10 MG PO TABS Oral Take 10 mg by mouth 3 (three) times daily as needed. Muscle spasms    . ESTRADIOL 0.1 MG/GM VA CREA Vaginal Place 2 g vaginally every Monday, Wednesday, and Friday.     Marland Kitchen HYDROCHLOROTHIAZIDE 12.5 MG PO CAPS Oral Take 12.5 mg by mouth daily.      Marland Kitchen MIRTAZAPINE 15 MG PO TABS Oral Take 15 mg by mouth daily.    . OXYCODONE HCL 5 MG PO TABS Oral Take 5 mg by mouth every 4 (four) hours as needed. For pain    . PANTOPRAZOLE SODIUM 40 MG PO TBEC Oral Take 40 mg by mouth 2 (two) times daily before a meal.    . POLYETHYLENE GLYCOL 3350 PO PACK Oral Take 17 g by  mouth 2 (two) times daily as needed. constipation    . SERTRALINE HCL 25 MG PO TABS Oral Take 25 mg by mouth daily.    Marland Kitchen TIMOLOL HEMIHYDRATE 0.5 % OP SOLN Left Eye Place 1 drop into the left eye daily.        BP 140/78  Pulse 88  Temp(Src) 98.1 F (36.7 C) (Oral)  Resp 18  SpO2 99%  Physical Exam  Nursing note and vitals reviewed. Constitutional: She is oriented to person, place, and time. She appears well-developed and well-nourished. No distress.  HENT:  Head: Normocephalic and atraumatic.  Right Ear: External ear normal.  Left Ear: External ear normal.  Mouth/Throat: Oropharynx is clear and moist.  Eyes: Pupils are equal, round, and reactive to light.  Neck: Neck supple.  Cardiovascular: Normal rate, regular rhythm and normal heart sounds.   Pulmonary/Chest: Effort normal and breath sounds normal. No respiratory distress. She has no wheezes. She exhibits no tenderness.  Abdominal: Soft. Bowel sounds are normal. She exhibits no distension. There is no tenderness. There is no rebound and no guarding.  Genitourinary: Guaiac positive stool.  Musculoskeletal:       Baseline range of motion with no obvious new focal weakness. Patient right arm is in an orthopedic device secondary to recent humerus fracture for which she has seen Dr. Thurston Hole.  Neurological: She is alert and oriented to person, place, and time. No cranial nerve deficit. Coordination normal.       Mental status appears baseline for patient and situation. There is no facial asymmetry.  Skin: Skin is warm and dry. No pallor.  Psychiatric: She has a normal mood and affect.    ED Course  Procedures (including critical care time)  Labs Reviewed  CBC - Abnormal; Notable for the following:    RBC 3.48 (*)    Hemoglobin 10.6 (*)    HCT 32.0 (*)    All other components within normal limits  COMPREHENSIVE METABOLIC PANEL - Abnormal; Notable for the following:    Glucose, Bld 109 (*)    Albumin 3.4 (*)    Total Bilirubin  0.2 (*)    GFR calc non Af Amer 66 (*)    GFR calc Af Amer 76 (*)    All other components within normal limits  OCCULT BLOOD, POC DEVICE  POCT OCCULT BLOOD STOOL, DEVICE   No results found.   Date: 09/20/2011  Rate: 87  Rhythm: atrial fibrillation  QRS Axis: normal  Intervals: normal  ST/T Wave abnormalities: normal  Conduction Disutrbances:none  Narrative Interpretation: Prior ECG dated 09/14/2011  Old EKG Reviewed:  unchanged    1. GI bleeding       MDM  12:30 PM Patient has been seen and evaluated. Initial history and physical examination complete. Patient is well-appearing. Hemoccult will be performed. Laboratory studies have been ordered. We'll continue to follow closely.     2:30PM No orthostasis on vital sign check.Suspect old blood in stool given rising hemoglobin in the face of positive hemoccult. I spoke with the patient's primary physician, Dr. Pete Glatter regarding her emergency department visit including her physical examination findings and laboratory studies. He agrees that outpatient followup is most appropriate at this time and the patient tells me that she R. he has an appointment scheduled for this Friday. She will be discharged home with advice to return to the wrist needed for any new or worsening symptoms.  Elwyn Reach [place], [place] 09/20/11 1452

## 2011-09-20 NOTE — ED Notes (Addendum)
Patient states she was discharged from [facility] on Sunday and was told to come to the ED if she had any further Black stools. Patient states she took at laxative last night and this morning she had a large BM and the stool was black. Patient states she had not had a BM x 4 days until this morning. Patient denies any pain except to in her right elbow that she broke it Jan 1st and is wearing a brace on the right arm. Patient states she is experiencing dizziness X 1 day.

## 2011-09-20 NOTE — ED Notes (Signed)
Pt reports large bowel movement this am that was completely black. Pt was just admitted last week for the same. Was told to come back to ED if it happened again. Pt reports dizziness.

## 2011-10-19 NOTE — Discharge Summary (Addendum)
DISCHARGE SUMMARY  Connie Wagner  MR#: [id number]  DOB:August 15, 1929  Date of Admission: 09/14/2011 Date of Discharge: 10/19/2011  Attending Physician:Jairon Ripberger  Patient's YNW:[id number],MVH Connie Fus, MD, MD  Consults:Treatment Team:  Barrie Folk, MD- GI  Presenting Complaint: Black stools  Discharge Diagnoses: Principal Problem:  *Upper GI bleed Active Problems:  Anemia associated with acute blood loss  Tachycardia  Anxiety  ETOH abuse  Supracondylar fracture of humerus, closed  Prepyloric ulcer  Hypotension due to blood loss  history of HTN (hypertension)    Discharge Medications: Medication List  As of 10/19/2011  9:59 AM   STOP taking these medications         baclofen 10 MG tablet         TAKE these medications         amLODipine 5 MG tablet   Commonly known as: NORVASC   Take 5 mg by mouth daily.      estradiol 0.1 MG/GM vaginal cream   Commonly known as: ESTRACE   Place 2 g vaginally every Monday, Wednesday, and Friday.      hydrochlorothiazide 12.5 MG capsule   Commonly known as: MICROZIDE   Take 12.5 mg by mouth daily.      mirtazapine 15 MG tablet   Commonly known as: REMERON   Take 15 mg by mouth daily.      sertraline 25 MG tablet   Commonly known as: ZOLOFT   Take 25 mg by mouth daily.      timolol 0.5 % ophthalmic solution   Commonly known as: BETIMOL   Place 1 drop into the left eye daily.             Procedures: EGD- 1/17 Pre-pyloric erosions with no obvious ulcer or stigmata of hemorrhage.  Hospital Course: Principal Problem:  *GI bleeding This is an 78 y/o female who has a history of HTN and a recent right arm fracture who presented with a complaint of black stools and a near syncopal episode. A GI consult was requested and she underwent an EGD with the above mentioned results. No specific source of bleed was found. However, she was never noted to have any black or bloody stools during the hospital stay. Since her bleeding  had resolved, it was later decided by Dr Connie Wagner that she did not need to undergo a colonoscopy.   Active Problems:  Anemia associated with acute blood loss She received 2 units of PRBC for a hemoglobin of 8.3 on admission. Her hemoglobin has since been stable at 10-10.6 range.   Tachycardia/Hypotension due to blood loss This corrected with blood transfusions   HTN (hypertension)  Anxiety  ETOH abuse She apparently drinks  wine but she later admitted that this was not on a daily basis. She did not go through ETOH withdrawal.    Supracondylar fracture of humerus, closed No issues were noted other than pain- she will follow up with orthopedics as an outpatient to have brace removed.   Day of Discharge Physical Exam: BP 148/67  Pulse 71  Temp(Src) 98.4 F (36.9 C) (Oral)  Resp 18  Ht 5' (1.524 m)  Wt 64 kg (141 lb 1.5 oz)  BMI 27.56 kg/m2  SpO2 100% General appearance: alert, cooperative, appears stated age and no distress  Resp: clear to auscultation bilaterally, currently on room air maintaining O2 saturations 100%  Cardio: regular rate and rhythm, S1, S2 normal, no murmur, click, rub or gallop  GI: soft, non-tender; bowel sounds normal; no masses, no  organomegaly  Extremities: extremities normal, atraumatic, no cyanosis or edema  Neurologic: Grossly normal   No results found for this or any previous visit (from the past 24 hour(s)).  Disposition: stable   Follow-up Appts: Discharge Orders    Future Orders Please Complete By Expires   Diet - low sodium heart healthy      Comments:   Avoid spicy, oily and caffienated food.   Increase activity slowly      Discharge instructions      Comments:   Do not take anything containing Motrin, Ibuprofen, Naprosyn, Naproxen, Alleve or Advil.      Follow-up with Dr. Madilyn Wagner in 2-3 weeks.  Time on Discharge: >4min  Signed: Erielle Wagner 10/19/2011, 9:59 AM

## 2011-10-19 NOTE — Discharge Summary (Signed)
Please see other completed d/c summary.

## 2012-03-30 MED ORDER — ZOLEDRONIC ACID 5 MG/100ML IV SOLN
INTRAVENOUS | Status: AC
  Filled 2012-03-30: qty 100

## 2012-04-01 ENCOUNTER — Other Ambulatory Visit: Payer: Self-pay | Admitting: Orthopedic Surgery

## 2012-04-13 ENCOUNTER — Ambulatory Visit (HOSPITAL_BASED_OUTPATIENT_CLINIC_OR_DEPARTMENT_OTHER): Admission: RE | Admit: 2012-04-13 | Payer: Medicare Other | Source: Ambulatory Visit | Admitting: Orthopedic Surgery

## 2012-04-13 ENCOUNTER — Encounter (HOSPITAL_BASED_OUTPATIENT_CLINIC_OR_DEPARTMENT_OTHER): Admission: RE | Payer: Self-pay | Source: Ambulatory Visit

## 2012-04-13 SURGERY — OPEN REDUCTION INTERNAL FIXATION (ORIF) HUMERAL SHAFT FRACTURE
Anesthesia: Regional | Laterality: Right

## 2012-05-18 ENCOUNTER — Emergency Department (HOSPITAL_COMMUNITY)
Admission: EM | Admit: 2012-05-18 | Discharge: 2012-05-18 | Disposition: A | Discharge status: Home / Self Care | Payer: Medicare Other | Attending: Emergency Medicine | Admitting: Emergency Medicine

## 2012-05-18 ENCOUNTER — Emergency Department (HOSPITAL_COMMUNITY): Payer: Medicare Other

## 2012-05-18 ENCOUNTER — Encounter (HOSPITAL_COMMUNITY): Payer: Self-pay

## 2012-05-18 DIAGNOSIS — S0003XA Contusion of scalp, initial encounter: Secondary | ICD-10-CM | POA: Insufficient documentation

## 2012-05-18 DIAGNOSIS — IMO0001 Reserved for inherently not codable concepts without codable children: Secondary | ICD-10-CM | POA: Insufficient documentation

## 2012-05-18 DIAGNOSIS — M25529 Pain in unspecified elbow: Secondary | ICD-10-CM | POA: Insufficient documentation

## 2012-05-18 DIAGNOSIS — R51 Headache: Secondary | ICD-10-CM | POA: Insufficient documentation

## 2012-05-18 DIAGNOSIS — I10 Essential (primary) hypertension: Secondary | ICD-10-CM | POA: Insufficient documentation

## 2012-05-18 DIAGNOSIS — Z79899 Other long term (current) drug therapy: Secondary | ICD-10-CM | POA: Insufficient documentation

## 2012-05-18 DIAGNOSIS — Y9210 Unspecified residential institution as the place of occurrence of the external cause: Secondary | ICD-10-CM | POA: Insufficient documentation

## 2012-05-18 DIAGNOSIS — W19XXXA Unspecified fall, initial encounter: Secondary | ICD-10-CM | POA: Insufficient documentation

## 2012-05-18 NOTE — ED Notes (Signed)
Pt alert and oriented, with steady gait at time of discharge. Pt given discharge papers and papers explained. All questions answered and pt wheeled to discharge.  

## 2012-05-18 NOTE — ED Notes (Signed)
Per EMS, pt is from Independent living at [facility] for tripping on a chair and falling. Has a bruise to the left posterior head. Denies any LOC. Also c/o left hip pain. No shortening or rotation. Is able to bear weight with pain.

## 2012-05-18 NOTE — ED Notes (Signed)
MD at bedside. 

## 2012-05-18 NOTE — ED Provider Notes (Signed)
History  This chart was scribed for [employer]. Rubin Payor, MD by Shari Heritage. The patient was seen in room TR06C/TR06C. Patient's care was started at 1741.     CSN: [id number]  Arrival date & time 05/18/12  1739   First MD Initiated Contact with Patient 05/18/12 1741      Chief Complaint  Patient presents with  . Fall    The history is provided by the patient. No language interpreter was used.    Connie Wagner is a 78 y.o. female who presents to the Emergency Department complaining of mild pain and bruising to the back of her head; moderate to severe, constant right elbow pain; and moderate to severe, constant, non-radiating left hip pain resulting from a fall that occurred less than 1 hour ago. Patient denies LOC. Patient is a resident of Independent Living at [facility]. Patient states that she fell and hit the back of her head after tripping on a chair. She states that the fall occurred during her happy hour, but she was not drinking.   Past Medical History  Diagnosis Date  . Hypertension   . Blood transfusion   . Anemia   . Upper GI bleeding 09/14/11    "have had it once before today"  . Migraines     "haven't had one since going thru menopause"  . Depression   . Anxiety   . Rheumatoid arthritis   . Chronic lower back pain   . Hx: UTI (urinary tract infection)     "every now and then"    Past Surgical History  Procedure Date  . Back surgery   . Joint replacement   . Lumbar disc surgery 09/2002    Hemilaminectomy, L3, complete laminectomy, L4, hemi- semilaminectomy, L5 and right S1, and diskectomy, L5-S1.  Marland Kitchen Total knee arthroplasty 01/2002    right  . Incision and drainage of wound 10/2002    lumbar wound infection  . Knee arthroscopy 01/2001    right  . Hematoma evacuation 10/2002    lumbar  . Lumbar laminectomy 10/2003    for epidural abscess evacuation; L4-5  . Breast lumpectomy     "benign tumor removed"  . Esophagogastroduodenoscopy 09/15/2011    Procedure:  ESOPHAGOGASTRODUODENOSCOPY (EGD);  Surgeon: Barrie Folk, MD;  Location: [facility] ENDOSCOPY;  Service: Endoscopy;  Laterality: N/A;    Family History  Problem Relation Age of Onset  . Anesthesia problems Neg Hx   . Hypotension Neg Hx   . Malignant hyperthermia Neg Hx   . Pseudochol deficiency Neg Hx     History  Substance Use Topics  . Smoking status: Never Smoker   . Smokeless tobacco: Never Used  . Alcohol Use: 3.0 oz/week    5 Glasses of wine per week    OB History    Grav Para Term Preterm Abortions TAB SAB Ect Mult Living                  Review of Systems  Musculoskeletal: Positive for arthralgias.  Neurological: Positive for headaches. Negative for syncope.  All other systems reviewed and are negative.    Allergies  Penicillins and Sulfa drugs cross reactors  Home Medications   Current Outpatient Rx  Name Route Sig Dispense Refill  . ACETAMINOPHEN 325 MG PO TABS Oral Take 650 mg by mouth every 4 (four) hours as needed. For pain    . AMLODIPINE BESYLATE 5 MG PO TABS Oral Take 5 mg by mouth daily.      Marland Kitchen  BACLOFEN 10 MG PO TABS Oral Take 10 mg by mouth 3 (three) times daily as needed. Muscle spasms    . ESTRADIOL 0.1 MG/GM VA CREA Vaginal Place 2 g vaginally daily as needed. for moderate to severe dryness, itching and burning    . HYDROCHLOROTHIAZIDE 12.5 MG PO CAPS Oral Take 12.5 mg by mouth daily.      Marland Kitchen MIRTAZAPINE 15 MG PO TABS Oral Take 15 mg by mouth daily.    Marland Kitchen TIMOLOL HEMIHYDRATE 0.5 % OP SOLN Left Eye Place 1 drop into the left eye daily.        BP 129/71  Pulse 105  Temp 97.6 F (36.4 C) (Oral)  Resp 16  Ht 5\' 2"  (1.575 m)  Wt 113 lb (51.256 kg)  BMI 20.67 kg/m2  SpO2 96%  Physical Exam  Constitutional: She is oriented to person, place, and time. She appears well-developed and well-nourished.  HENT:  Head: Normocephalic and atraumatic.       Hematoma over left mastoid area. Atraumatic otherwise.  Cardiovascular: Normal rate and regular rhythm.     Pulmonary/Chest: Effort normal and breath sounds normal. She exhibits no tenderness.  Abdominal: There is no tenderness.  Musculoskeletal: Normal range of motion.       Cervice spine is nontender with ROM.  Crepitance in left elbow with pronation and supination. Neurovascularly intact distally. Strong radial pulse.   Tenderness over the left anterior iliac area. No bruising. No crepitance. No deformity. Neurovascularly intact distally.  Neurological: She is alert and oriented to person, place, and time.  Skin: Skin is warm and dry.  Psychiatric: She has a normal mood and affect. Her behavior is normal.    ED Course  Procedures (including critical care time) COORDINATION OF CARE: 6:09pm- Patient informed of current plan for treatment and evaluation and agrees with plan at this time.    Labs Reviewed - No data to display  No results found.   1. Fall   2. Scalp hematoma       MDM  Patient with a fall. Hematoma in head. Previous fracture right elbow is unchanged. She be discharged home. CT is reassuring.      I personally performed the services described in this documentation, which was scribed in my presence. The recorded information has been reviewed and considered.     Juliet Rude. Rubin Payor, MD 05/21/12 2043

## 2012-05-18 NOTE — ED Notes (Signed)
Pt ambulated to and from the bathroom without any difficulty

## 2012-07-02 ENCOUNTER — Other Ambulatory Visit (HOSPITAL_COMMUNITY): Payer: Self-pay | Admitting: Geriatric Medicine

## 2012-07-02 DIAGNOSIS — Z1231 Encounter for screening mammogram for malignant neoplasm of breast: Secondary | ICD-10-CM

## 2012-07-23 ENCOUNTER — Ambulatory Visit (HOSPITAL_COMMUNITY)
Admission: RE | Admit: 2012-07-23 | Discharge: 2012-07-23 | Disposition: A | Discharge status: Home / Self Care | Payer: Medicare Other | Source: Ambulatory Visit | Attending: Geriatric Medicine | Admitting: Geriatric Medicine

## 2012-07-23 DIAGNOSIS — Z1231 Encounter for screening mammogram for malignant neoplasm of breast: Secondary | ICD-10-CM | POA: Insufficient documentation

## 2012-08-10 ENCOUNTER — Ambulatory Visit
Admission: RE | Admit: 2012-08-10 | Discharge: 2012-08-10 | Disposition: A | Discharge status: Home / Self Care | Payer: Medicare Other | Source: Ambulatory Visit | Attending: Orthopedic Surgery | Admitting: Orthopedic Surgery

## 2012-08-10 ENCOUNTER — Encounter (HOSPITAL_COMMUNITY): Payer: Self-pay | Admitting: Pharmacy Technician

## 2012-08-10 ENCOUNTER — Other Ambulatory Visit: Payer: Self-pay | Admitting: Orthopedic Surgery

## 2012-08-10 DIAGNOSIS — R52 Pain, unspecified: Secondary | ICD-10-CM

## 2012-08-13 ENCOUNTER — Other Ambulatory Visit: Payer: Self-pay | Admitting: Orthopedic Surgery

## 2012-08-13 NOTE — Pre-Procedure Instructions (Signed)
20 Connie Wagner  08/13/2012   Your procedure is scheduled on:  08-17-2012  Report to [facility] at 10:15 [facility] Elevators to the 3rd floor  Call this number if you have problems the morning of surgery: [phone]   Remember:   Do not eat food or drink:After Midnight.      Take these medicines the morning of surgery with A SIP OF WATER: Amlodipine(Norvasc)   Do not wear jewelry, make-up or nail polish.  Do not wear lotions, powders, or perfumes.   Do not shave 48 hours prior to surgery.   Do not bring valuables to the hospital.  Contacts, dentures or bridgework may not be worn into surgery.  Leave suitcase in the car. After surgery it may be brought to your room.   For patients admitted to the hospital, checkout time is 11:00 AM the day of discharge.   Patients discharged the day of surgery will not be allowed to drive home.   Name and phone number of your driver: _________________   Special Instructions: Shower using CHG 2 nights before surgery and the night before surgery.  If you shower the day of surgery use CHG.  Use special wash - you have one bottle of CHG for all showers.  You should use approximately 1/3 of the bottle for each shower.     Please read over the following fact sheets that you were given: Pain Booklet, Coughing and Deep Breathing, Blood Transfusion Information, MRSA Information and Surgical Site Infection Prevention

## 2012-08-14 ENCOUNTER — Encounter (HOSPITAL_COMMUNITY)
Admission: RE | Admit: 2012-08-14 | Discharge: 2012-08-14 | Disposition: A | Discharge status: Home / Self Care | Payer: Medicare Other | Source: Ambulatory Visit | Attending: Orthopedic Surgery | Admitting: Orthopedic Surgery

## 2012-08-14 ENCOUNTER — Encounter (HOSPITAL_COMMUNITY): Payer: Self-pay

## 2012-08-14 HISTORY — DX: Unspecified dementia, unspecified severity, without behavioral disturbance, psychotic disturbance, mood disturbance, and anxiety: F03.90

## 2012-08-14 LAB — SURGICAL PCR SCREEN
MRSA, PCR: NEGATIVE
Staphylococcus aureus: NEGATIVE

## 2012-08-14 LAB — BASIC METABOLIC PANEL
BUN: 15 mg/dL (ref 6–23)
Chloride: 98 mEq/L (ref 96–112)
GFR calc Af Amer: 76 mL/min — ABNORMAL LOW (ref >90)
Potassium: 4 mEq/L (ref 3.5–5.1)
Sodium: 137 mEq/L (ref 135–145)

## 2012-08-14 LAB — URINALYSIS, ROUTINE W REFLEX MICROSCOPIC
Bilirubin Urine: NEGATIVE
Ketones, ur: NEGATIVE mg/dL
Nitrite: NEGATIVE
Protein, ur: NEGATIVE mg/dL

## 2012-08-14 LAB — CBC
HCT: 37.5 % (ref 36.0–46.0)
Hemoglobin: 11.7 g/dL — ABNORMAL LOW (ref 12.0–15.0)
RBC: 4.39 MIL/uL (ref 3.87–5.11)
WBC: 6.8 10*3/uL (ref 4.0–10.5)

## 2012-08-14 LAB — TYPE AND SCREEN
ABO/RH(D): A POS
Antibody Screen: NEGATIVE

## 2012-08-14 LAB — URINE MICROSCOPIC-ADD ON

## 2012-08-14 LAB — PROTIME-INR: INR: 1 (ref 0.00–1.49)

## 2012-08-14 NOTE — Progress Notes (Signed)
Will give ekg to [facility] for review. Dr Pete Glatter pcp. No recent cardiac tests

## 2012-08-15 NOTE — Consult Note (Signed)
Anesthesia chart review: Patient is an 78 year old female scheduled for ORIF right distal humerus nonunion by Dr. Dion Saucier on 08/17/2012. History includes rheumatoid arthritis, dementia, depression, anemia, anxiety, migraines, hypertension, GI bleed 08/2011 with history of transfusion, nonsmoker. There is also question of ETOH abuse per prior notes.  She had a single seizure in May 2012 with discharge summary indicating patient had a normal EEG and no acute CT/MRI findings, so Neurology did not recommend any antiepileptic medications at that time.  PCP is Dr. Pete Glatter.  Preoperative labs noted.  Chest x-ray on 08/14/2012 showed no acute cardiopulmonary disease.  EKG on 09/20/2011 showed normal sinus rhythm with multiple PACs.  Echo on 01/24/11 (see Notes tab) showed: Normal LV cavity size. Mild physical basal hypertrophy of the septum. Normal systolic function. Estimated EF 55-60%. Normal LV wall motion. There was an increase relative contribution of atrial contraction to ventricular filling. Doppler parameters are consistent with abnormal left ventricular relaxation (grade 1 diastolic dysfunction). Aortic valve had moderate diffuse thickening and calcification consistent with sclerosis. There was increased thickness of the atrial septum, consistent with lipomatous hypertrophy. Pulmonary artery peak pressure 39 mmHg (S). Trivial mitral regurgitation.  Currently, there is no documentation of sustained atrial arrhythmias.  She is on Norvasc and HCTZ, but no antiarrhythmic medications.  Her EF less than two years ago was normal.  She will be evaluated on the day of surgery, but if no acute CV symptoms or sustained arrhythmias then would anticipate she can proceed as planned.  Shonna Chock, PA-C 08/15/12 1315

## 2012-08-16 LAB — URINE CULTURE

## 2012-08-16 MED ORDER — VANCOMYCIN HCL IN DEXTROSE 1-5 GM/200ML-% IV SOLN
1000.0000 mg | INTRAVENOUS | Status: AC
  Administered 2012-08-17: 1000 mg via INTRAVENOUS
  Filled 2012-08-16: qty 200

## 2012-08-17 ENCOUNTER — Encounter (HOSPITAL_COMMUNITY): Payer: Self-pay | Admitting: Anesthesiology

## 2012-08-17 ENCOUNTER — Encounter (HOSPITAL_COMMUNITY): Payer: Self-pay | Admitting: Vascular Surgery

## 2012-08-17 ENCOUNTER — Encounter (HOSPITAL_COMMUNITY): Payer: Self-pay | Admitting: Orthopedic Surgery

## 2012-08-17 ENCOUNTER — Inpatient Hospital Stay (HOSPITAL_COMMUNITY)
Admission: RE | Admit: 2012-08-17 | Discharge: 2012-08-21 | DRG: 493 | Disposition: A | Discharge status: Skilled Nursing Facility | Payer: Medicare Other | Source: Ambulatory Visit | Attending: Orthopedic Surgery | Admitting: Orthopedic Surgery

## 2012-08-17 ENCOUNTER — Ambulatory Visit (HOSPITAL_COMMUNITY): Payer: Medicare Other | Admitting: Vascular Surgery

## 2012-08-17 ENCOUNTER — Encounter (HOSPITAL_COMMUNITY)
Admission: RE | Disposition: A | Discharge status: Skilled Nursing Facility | Payer: Self-pay | Source: Ambulatory Visit | Attending: Orthopedic Surgery

## 2012-08-17 ENCOUNTER — Ambulatory Visit (HOSPITAL_COMMUNITY): Payer: Medicare Other

## 2012-08-17 DIAGNOSIS — G8929 Other chronic pain: Secondary | ICD-10-CM | POA: Diagnosis present

## 2012-08-17 DIAGNOSIS — M069 Rheumatoid arthritis, unspecified: Secondary | ICD-10-CM | POA: Diagnosis present

## 2012-08-17 DIAGNOSIS — A498 Other bacterial infections of unspecified site: Secondary | ICD-10-CM | POA: Diagnosis not present

## 2012-08-17 DIAGNOSIS — Z01812 Encounter for preprocedural laboratory examination: Secondary | ICD-10-CM

## 2012-08-17 DIAGNOSIS — Z01818 Encounter for other preprocedural examination: Secondary | ICD-10-CM

## 2012-08-17 DIAGNOSIS — F411 Generalized anxiety disorder: Secondary | ICD-10-CM | POA: Diagnosis present

## 2012-08-17 DIAGNOSIS — F32A Depression, unspecified: Secondary | ICD-10-CM | POA: Diagnosis present

## 2012-08-17 DIAGNOSIS — B9620 Unspecified Escherichia coli [E. coli] as the cause of diseases classified elsewhere: Secondary | ICD-10-CM | POA: Diagnosis present

## 2012-08-17 DIAGNOSIS — F419 Anxiety disorder, unspecified: Secondary | ICD-10-CM | POA: Diagnosis present

## 2012-08-17 DIAGNOSIS — IMO0002 Reserved for concepts with insufficient information to code with codable children: Principal | ICD-10-CM | POA: Diagnosis present

## 2012-08-17 DIAGNOSIS — S72453K Displaced supracondylar fracture without intracondylar extension of lower end of unspecified femur, subsequent encounter for closed fracture with nonunion: Secondary | ICD-10-CM | POA: Diagnosis present

## 2012-08-17 DIAGNOSIS — X58XXXS Exposure to other specified factors, sequela: Secondary | ICD-10-CM

## 2012-08-17 DIAGNOSIS — Z96659 Presence of unspecified artificial knee joint: Secondary | ICD-10-CM

## 2012-08-17 DIAGNOSIS — I10 Essential (primary) hypertension: Secondary | ICD-10-CM | POA: Diagnosis present

## 2012-08-17 DIAGNOSIS — D649 Anemia, unspecified: Secondary | ICD-10-CM | POA: Diagnosis present

## 2012-08-17 DIAGNOSIS — N390 Urinary tract infection, site not specified: Secondary | ICD-10-CM | POA: Diagnosis present

## 2012-08-17 DIAGNOSIS — F329 Major depressive disorder, single episode, unspecified: Secondary | ICD-10-CM | POA: Diagnosis present

## 2012-08-17 DIAGNOSIS — F0390 Unspecified dementia without behavioral disturbance: Secondary | ICD-10-CM | POA: Diagnosis present

## 2012-08-17 DIAGNOSIS — F3289 Other specified depressive episodes: Secondary | ICD-10-CM | POA: Diagnosis present

## 2012-08-17 DIAGNOSIS — S42309S Unspecified fracture of shaft of humerus, unspecified arm, sequela: Secondary | ICD-10-CM

## 2012-08-17 DIAGNOSIS — M899 Disorder of bone, unspecified: Secondary | ICD-10-CM | POA: Diagnosis present

## 2012-08-17 DIAGNOSIS — M549 Dorsalgia, unspecified: Secondary | ICD-10-CM | POA: Diagnosis present

## 2012-08-17 DIAGNOSIS — Z79899 Other long term (current) drug therapy: Secondary | ICD-10-CM

## 2012-08-17 HISTORY — DX: Displaced supracondylar fracture without intracondylar extension of lower end of unspecified femur, subsequent encounter for closed fracture with nonunion: S72.453K

## 2012-08-17 HISTORY — PX: HARVEST BONE GRAFT: SHX377

## 2012-08-17 HISTORY — PX: ORIF HUMERUS FRACTURE: SHX2126

## 2012-08-17 SURGERY — OPEN REDUCTION INTERNAL FIXATION (ORIF) DISTAL HUMERUS FRACTURE
Anesthesia: General | Site: Hip | Laterality: Right | Wound class: Clean

## 2012-08-17 MED ORDER — MENTHOL 3 MG MT LOZG: 1.0000 | LOZENGE | OROMUCOSAL | Status: DC | PRN

## 2012-08-17 MED ORDER — BUPIVACAINE HCL (PF) 0.5 % IJ SOLN
INTRAMUSCULAR | Status: DC | PRN
  Administered 2012-08-17: 20 mL

## 2012-08-17 MED ORDER — DOCUSATE SODIUM 100 MG PO CAPS
100.0000 mg | ORAL_CAPSULE | Freq: Two times a day (BID) | ORAL | Status: DC
  Administered 2012-08-17 – 2012-08-21 (×8): 100 mg via ORAL
  Filled 2012-08-17 (×9): qty 1

## 2012-08-17 MED ORDER — ACETAMINOPHEN 325 MG PO TABS: 650.0000 mg | ORAL_TABLET | Freq: Four times a day (QID) | ORAL | Status: DC | PRN

## 2012-08-17 MED ORDER — METOCLOPRAMIDE HCL 10 MG PO TABS: 5.0000 mg | ORAL_TABLET | Freq: Three times a day (TID) | ORAL | Status: DC | PRN

## 2012-08-17 MED ORDER — ONDANSETRON HCL 4 MG PO TABS: 4.0000 mg | ORAL_TABLET | Freq: Three times a day (TID) | ORAL | Status: DC | PRN

## 2012-08-17 MED ORDER — HYDROCODONE-ACETAMINOPHEN 10-325 MG PO TABS: 1.0000 | ORAL_TABLET | Freq: Four times a day (QID) | ORAL | Status: DC | PRN

## 2012-08-17 MED ORDER — ONDANSETRON HCL 4 MG/2ML IJ SOLN: 4.0000 mg | Freq: Four times a day (QID) | INTRAMUSCULAR | Status: DC | PRN

## 2012-08-17 MED ORDER — HYDROCHLOROTHIAZIDE 12.5 MG PO CAPS
12.5000 mg | ORAL_CAPSULE | Freq: Every day | ORAL | Status: DC
  Administered 2012-08-17 – 2012-08-21 (×4): 12.5 mg via ORAL
  Filled 2012-08-17 (×5): qty 1

## 2012-08-17 MED ORDER — PHENYLEPHRINE HCL 10 MG/ML IJ SOLN
INTRAMUSCULAR | Status: DC | PRN
  Administered 2012-08-17: 120 ug via INTRAVENOUS
  Administered 2012-08-17 (×2): 40 ug via INTRAVENOUS
  Administered 2012-08-17: 80 ug via INTRAVENOUS
  Administered 2012-08-17: 120 ug via INTRAVENOUS

## 2012-08-17 MED ORDER — ZOLPIDEM TARTRATE 5 MG PO TABS
5.0000 mg | ORAL_TABLET | Freq: Every evening | ORAL | Status: DC | PRN
  Filled 2012-08-17 (×2): qty 1

## 2012-08-17 MED ORDER — SORBITOL 70 % SOLN
30.0000 mL | Freq: Every day | Status: DC | PRN
  Administered 2012-08-21: 30 mL via ORAL
  Filled 2012-08-17: qty 30

## 2012-08-17 MED ORDER — FENTANYL CITRATE 0.05 MG/ML IJ SOLN
INTRAMUSCULAR | Status: DC | PRN
  Administered 2012-08-17: 50 ug via INTRAVENOUS
  Administered 2012-08-17 (×2): 25 ug via INTRAVENOUS
  Administered 2012-08-17 (×3): 50 ug via INTRAVENOUS

## 2012-08-17 MED ORDER — DIPHENHYDRAMINE HCL 50 MG/ML IJ SOLN
INTRAMUSCULAR | Status: AC
  Filled 2012-08-17: qty 1

## 2012-08-17 MED ORDER — LACTATED RINGERS IV SOLN
INTRAVENOUS | Status: DC | PRN
  Administered 2012-08-17 (×3): via INTRAVENOUS

## 2012-08-17 MED ORDER — FERROUS SULFATE 325 (65 FE) MG PO TABS
325.0000 mg | ORAL_TABLET | Freq: Every day | ORAL | Status: DC
  Administered 2012-08-18 – 2012-08-21 (×4): 325 mg via ORAL
  Filled 2012-08-17 (×5): qty 1

## 2012-08-17 MED ORDER — AMLODIPINE BESYLATE 10 MG PO TABS
10.0000 mg | ORAL_TABLET | Freq: Every day | ORAL | Status: DC
  Administered 2012-08-17 – 2012-08-21 (×4): 10 mg via ORAL
  Filled 2012-08-17 (×5): qty 1

## 2012-08-17 MED ORDER — ZOLPIDEM TARTRATE 5 MG PO TABS
5.0000 mg | ORAL_TABLET | Freq: Every day | ORAL | Status: DC
  Administered 2012-08-17 – 2012-08-20 (×4): 5 mg via ORAL
  Filled 2012-08-17 (×2): qty 1

## 2012-08-17 MED ORDER — ONDANSETRON HCL 4 MG PO TABS: 4.0000 mg | ORAL_TABLET | Freq: Four times a day (QID) | ORAL | Status: DC | PRN

## 2012-08-17 MED ORDER — METOCLOPRAMIDE HCL 5 MG/ML IJ SOLN: 5.0000 mg | Freq: Three times a day (TID) | INTRAMUSCULAR | Status: DC | PRN

## 2012-08-17 MED ORDER — POLYETHYLENE GLYCOL 3350 17 G PO PACK: 17.0000 g | PACK | Freq: Every day | ORAL | Status: DC | PRN

## 2012-08-17 MED ORDER — HYDROMORPHONE HCL PF 1 MG/ML IJ SOLN
INTRAMUSCULAR | Status: AC
  Filled 2012-08-17: qty 1

## 2012-08-17 MED ORDER — HYDROCODONE-ACETAMINOPHEN 10-325 MG PO TABS
1.0000 | ORAL_TABLET | ORAL | Status: DC | PRN
  Administered 2012-08-17: 1 via ORAL
  Administered 2012-08-18: 2 via ORAL
  Administered 2012-08-18: 1 via ORAL
  Administered 2012-08-18 – 2012-08-20 (×7): 2 via ORAL
  Filled 2012-08-17: qty 2
  Filled 2012-08-17 (×2): qty 1
  Filled 2012-08-17 (×3): qty 2
  Filled 2012-08-17: qty 1
  Filled 2012-08-17 (×2): qty 2
  Filled 2012-08-17: qty 1
  Filled 2012-08-17 (×2): qty 2

## 2012-08-17 MED ORDER — ALUM & MAG HYDROXIDE-SIMETH 200-200-20 MG/5ML PO SUSP: 30.0000 mL | ORAL | Status: DC | PRN

## 2012-08-17 MED ORDER — POTASSIUM CHLORIDE IN NACL 20-0.45 MEQ/L-% IV SOLN
INTRAVENOUS | Status: DC
  Administered 2012-08-17: 22:00:00 via INTRAVENOUS
  Filled 2012-08-17 (×2): qty 1000

## 2012-08-17 MED ORDER — HYDROMORPHONE HCL PF 1 MG/ML IJ SOLN
0.2500 mg | INTRAMUSCULAR | Status: DC | PRN
  Administered 2012-08-17 (×4): 0.5 mg via INTRAVENOUS

## 2012-08-17 MED ORDER — ACETAMINOPHEN 650 MG RE SUPP: 650.0000 mg | Freq: Four times a day (QID) | RECTAL | Status: DC | PRN

## 2012-08-17 MED ORDER — SENNA 8.6 MG PO TABS
1.0000 | ORAL_TABLET | Freq: Two times a day (BID) | ORAL | Status: DC
  Administered 2012-08-17 – 2012-08-21 (×8): 8.6 mg via ORAL
  Filled 2012-08-17 (×9): qty 1

## 2012-08-17 MED ORDER — ONDANSETRON HCL 4 MG/2ML IJ SOLN
INTRAMUSCULAR | Status: DC | PRN
  Administered 2012-08-17: 4 mg via INTRAVENOUS

## 2012-08-17 MED ORDER — FENTANYL CITRATE 0.05 MG/ML IJ SOLN
INTRAMUSCULAR | Status: AC
  Filled 2012-08-17: qty 2

## 2012-08-17 MED ORDER — PROPOFOL 10 MG/ML IV BOLUS
INTRAVENOUS | Status: DC | PRN
  Administered 2012-08-17: 160 mg via INTRAVENOUS

## 2012-08-17 MED ORDER — ARTIFICIAL TEARS OP OINT
TOPICAL_OINTMENT | OPHTHALMIC | Status: DC | PRN
  Administered 2012-08-17: 1 via OPHTHALMIC

## 2012-08-17 MED ORDER — ROCURONIUM BROMIDE 100 MG/10ML IV SOLN
INTRAVENOUS | Status: DC | PRN
  Administered 2012-08-17: 40 mg via INTRAVENOUS

## 2012-08-17 MED ORDER — SENNA-DOCUSATE SODIUM 8.6-50 MG PO TABS: 1.0000 | ORAL_TABLET | Freq: Every day | ORAL | Status: DC

## 2012-08-17 MED ORDER — ONDANSETRON HCL 4 MG/2ML IJ SOLN: 4.0000 mg | Freq: Once | INTRAMUSCULAR | Status: DC | PRN

## 2012-08-17 MED ORDER — PHENOL 1.4 % MT LIQD: 1.0000 | OROMUCOSAL | Status: DC | PRN

## 2012-08-17 MED ORDER — VANCOMYCIN HCL IN DEXTROSE 1-5 GM/200ML-% IV SOLN
1000.0000 mg | Freq: Two times a day (BID) | INTRAVENOUS | Status: AC
  Administered 2012-08-17: 1000 mg via INTRAVENOUS
  Filled 2012-08-17: qty 200

## 2012-08-17 MED ORDER — MORPHINE SULFATE 2 MG/ML IJ SOLN
1.0000 mg | INTRAMUSCULAR | Status: DC | PRN
  Administered 2012-08-17 (×2): 1 mg via INTRAVENOUS
  Filled 2012-08-17 (×2): qty 1

## 2012-08-17 MED ORDER — SODIUM CHLORIDE 0.9 % IV SOLN
10.0000 mg | INTRAVENOUS | Status: DC | PRN
  Administered 2012-08-17: 10 ug/min via INTRAVENOUS

## 2012-08-17 MED ORDER — DIPHENHYDRAMINE HCL 12.5 MG/5ML PO ELIX
12.5000 mg | ORAL_SOLUTION | ORAL | Status: DC | PRN
  Administered 2012-08-17 – 2012-08-18 (×2): 12.5 mg via ORAL
  Filled 2012-08-17 (×2): qty 10

## 2012-08-17 MED ORDER — 0.9 % SODIUM CHLORIDE (POUR BTL) OPTIME
TOPICAL | Status: DC | PRN
  Administered 2012-08-17: 1000 mL

## 2012-08-17 MED ORDER — DIPHENHYDRAMINE HCL 50 MG/ML IJ SOLN
6.2500 mg | Freq: Once | INTRAMUSCULAR | Status: AC
  Administered 2012-08-17: 6.25 mg via INTRAVENOUS

## 2012-08-17 SURGICAL SUPPLY — 93 items
1.6 MM KWIRE ×2 IMPLANT
APL SKNCLS STERI-STRIP NONHPOA (GAUZE/BANDAGES/DRESSINGS) ×2
BANDAGE ELASTIC 4 VELCRO ST LF (GAUZE/BANDAGES/DRESSINGS) ×1 IMPLANT
BANDAGE ELASTIC 6 VELCRO ST LF (GAUZE/BANDAGES/DRESSINGS) ×1 IMPLANT
BENZOIN TINCTURE PRP APPL 2/3 (GAUZE/BANDAGES/DRESSINGS) ×3 IMPLANT
BIT DRILL 2.0 (BIT) ×3
BIT DRILL 2.5X2.75 QC CALB (BIT) ×1 IMPLANT
BIT DRILL 2XNS DISP SS SM FRAG (BIT) IMPLANT
BIT DRILL CALIBRATED 2.7 (BIT) ×1 IMPLANT
BIT DRL 2XNS DISP SS SM FRAG (BIT) ×2
BLADE AVERAGE 25X9 (BLADE) ×1 IMPLANT
BLADE SURG 10 STRL SS (BLADE) ×3 IMPLANT
BLADE SURG 15 STRL LF DISP TIS (BLADE) ×2 IMPLANT
BLADE SURG 15 STRL SS (BLADE)
CLEANER TIP ELECTROSURG 2X2 (MISCELLANEOUS) IMPLANT
CLOTH BEACON ORANGE TIMEOUT ST (SAFETY) ×3 IMPLANT
CLSR STERI-STRIP ANTIMIC 1/2X4 (GAUZE/BANDAGES/DRESSINGS) ×2 IMPLANT
CORDS BIPOLAR (ELECTRODE) ×1 IMPLANT
COVER SURGICAL LIGHT HANDLE (MISCELLANEOUS) ×3 IMPLANT
DRAPE C-ARM 42X72 X-RAY (DRAPES) ×3 IMPLANT
DRAPE INCISE IOBAN 66X45 STRL (DRAPES) ×4 IMPLANT
DRAPE PED LAPAROTOMY (DRAPES) ×1 IMPLANT
DRAPE SURG 17X23 STRL (DRAPES) ×6 IMPLANT
DRAPE U-SHAPE 47X51 STRL (DRAPES) ×3 IMPLANT
DRILL SLEEVE 2.7 DIST TIB (TRAUMA) ×1
DRSG MEPILEX BORDER 4X4 (GAUZE/BANDAGES/DRESSINGS) ×1 IMPLANT
DRSG PAD ABDOMINAL 8X10 ST (GAUZE/BANDAGES/DRESSINGS) ×8 IMPLANT
DURAPREP 26ML APPLICATOR (WOUND CARE) ×3 IMPLANT
Distal Humerus Plate-Medial-Right ×1 IMPLANT
ELECT CAUTERY BLADE 6.4 (BLADE) ×1 IMPLANT
ELECT REM PT RETURN 9FT ADLT (ELECTROSURGICAL)
ELECTRODE REM PT RTRN 9FT ADLT (ELECTROSURGICAL) IMPLANT
GAUZE XEROFORM 1X8 LF (GAUZE/BANDAGES/DRESSINGS) ×2 IMPLANT
GLOVE BIOGEL PI IND STRL 8 (GLOVE) ×2 IMPLANT
GLOVE BIOGEL PI INDICATOR 8 (GLOVE) ×4
GLOVE ORTHO TXT STRL SZ7.5 (GLOVE) ×12 IMPLANT
GLOVE SURG ORTHO 8.0 STRL STRW (GLOVE) ×6 IMPLANT
IMPLANT OP-1 (Orthopedic Implant) ×1 IMPLANT
K-WIRE ACE 1.6X6 (WIRE) ×6
KIT BASIN OR (CUSTOM PROCEDURE TRAY) ×3 IMPLANT
KIT ROOM TURNOVER OR (KITS) ×3 IMPLANT
KWIRE ACE 1.6X6 (WIRE) IMPLANT
LOOP VESSEL MAXI BLUE (MISCELLANEOUS) ×1 IMPLANT
MANIFOLD NEPTUNE II (INSTRUMENTS) ×3 IMPLANT
NDL HYPO 21X1.5 SAFETY (NEEDLE) ×4 IMPLANT
NDL HYPO 25GX1X1/2 BEV (NEEDLE) ×2 IMPLANT
NEEDLE HYPO 21X1.5 SAFETY (NEEDLE) ×6 IMPLANT
NEEDLE HYPO 25GX1X1/2 BEV (NEEDLE) ×3 IMPLANT
NS IRRIG 1000ML POUR BTL (IV SOLUTION) ×3 IMPLANT
PACK ORTHO EXTREMITY (CUSTOM PROCEDURE TRAY) ×1 IMPLANT
PACK SHOULDER (CUSTOM PROCEDURE TRAY) ×2 IMPLANT
PAD ARMBOARD 7.5X6 YLW CONV (MISCELLANEOUS) ×6 IMPLANT
PAD CAST 4YDX4 CTTN HI CHSV (CAST SUPPLIES) IMPLANT
PADDING CAST ABS 4INX4YD NS (CAST SUPPLIES) ×2
PADDING CAST ABS COTTON 4X4 ST (CAST SUPPLIES) IMPLANT
PADDING CAST COTTON 4X4 STRL (CAST SUPPLIES) ×6
PASSER SUT SWANSON 36MM LOOP (INSTRUMENTS) IMPLANT
PENCIL BUTTON HOLSTER BLD 10FT (ELECTRODE) ×1 IMPLANT
PLATE LOCK RT SM (Plate) ×3 IMPLANT
PLATE LOCK RT SM 7 HL (Plate) IMPLANT
SCREW CORT T15 30X3.5XST LCK (Screw) IMPLANT
SCREW CORTICAL 3.5X30MM (Screw) ×3 IMPLANT
SCREW LOCK CORT STAR 3.5X12 (Screw) ×1 IMPLANT
SCREW LOCK CORT STAR 3.5X36 (Screw) ×1 IMPLANT
SCREW LOCK CORT STAR 3.5X54 (Screw) ×1 IMPLANT
SCREW LOCK CORT STAR 3.5X56 (Screw) ×2 IMPLANT
SCREW LOW PROFILE 22MMX3.5MM (Screw) ×3 IMPLANT
SLEEVE DRILL 2.7 DIST TIB (TRAUMA) IMPLANT
SLING ARM FOAM STRAP MED (SOFTGOODS) ×1 IMPLANT
SPONGE GAUZE 4X4 12PLY (GAUZE/BANDAGES/DRESSINGS) ×1 IMPLANT
STAPLER VISISTAT 35W (STAPLE) IMPLANT
STRIP CLOSURE SKIN 1/2X4 (GAUZE/BANDAGES/DRESSINGS) ×3 IMPLANT
SUCTION FRAZIER TIP 10 FR DISP (SUCTIONS) IMPLANT
SURGICAL STEEL 18GA (Wire) ×1 IMPLANT
SUT FIBERWIRE #2 38 T-5 BLUE (SUTURE)
SUT MNCRL AB 4-0 PS2 18 (SUTURE) ×5 IMPLANT
SUT VIC AB 0 CT1 27 (SUTURE) ×15
SUT VIC AB 0 CT1 27XBRD ANBCTR (SUTURE) IMPLANT
SUT VIC AB 0 CTB1 27 (SUTURE) IMPLANT
SUT VIC AB 2-0 CT1 36 (SUTURE) ×2 IMPLANT
SUT VIC AB 3-0 FS2 27 (SUTURE) ×2 IMPLANT
SUT VIC AB 3-0 PS2 18 (SUTURE) ×3
SUT VIC AB 3-0 PS2 18XBRD (SUTURE) IMPLANT
SUT VIC AB 3-0 SH 8-18 (SUTURE) ×2 IMPLANT
SUTURE FIBERWR #2 38 T-5 BLUE (SUTURE) IMPLANT
SYR BULB IRRIGATION 50ML (SYRINGE) ×3 IMPLANT
SYR CONTROL 10ML LL (SYRINGE) ×3 IMPLANT
TOWEL OR 17X24 6PK STRL BLUE (TOWEL DISPOSABLE) ×6 IMPLANT
TOWEL OR 17X26 10 PK STRL BLUE (TOWEL DISPOSABLE) ×3 IMPLANT
TRAY FOLEY CATH 14FR (SET/KITS/TRAYS/PACK) ×1 IMPLANT
WASHER 3.5MM (Orthopedic Implant) ×2 IMPLANT
WATER STERILE IRR 1000ML POUR (IV SOLUTION) ×3 IMPLANT
YANKAUER SUCT BULB TIP NO VENT (SUCTIONS) IMPLANT

## 2012-08-17 NOTE — Anesthesia Procedure Notes (Addendum)
Anesthesia Regional Block:  Interscalene brachial plexus block  Pre-Anesthetic Checklist: ,, timeout performed, Correct Patient, Correct Site, Correct Laterality, Correct Procedure, Correct Position, site marked, Risks and benefits discussed,  Surgical consent,  Pre-op evaluation,  At surgeon's request and post-op pain management  Laterality: Right  Prep: Maximum Sterile Barrier Precautions used, chloraprep and alcohol swabs       Needles:  Injection technique: Single-shot  Needle Type: Stimulator Needle - 40        Needle insertion depth: 4 cm   Additional Needles:  Procedures: nerve stimulator Interscalene brachial plexus block  Nerve Stimulator or Paresthesia:  Response: 0.5 mA, 0.1 ms, 4 cm  Additional Responses:   Narrative:  Start time: 08/17/2012 12:20 PM End time: 08/17/2012 12:25 PM Injection made incrementally with aspirations every 5 mL.  Performed by: Personally  Anesthesiologist: Maren Beach MD  Additional Notes: Pt accepts risks of procedure. 12 cc 0.5 Marcaine w/ epi w/o difficulty and mild discomfort. Feliberto Gottron MD   Procedure Name: Intubation Date/Time: 08/17/2012 1:35 PM Performed by: De Nurse Pre-anesthesia Checklist: Patient identified, Timeout performed, Emergency Drugs available, Suction available and Patient being monitored Patient Re-evaluated:Patient Re-evaluated prior to inductionOxygen Delivery Method: Circle system utilized Preoxygenation: Pre-oxygenation with 100% oxygen Intubation Type: IV induction Ventilation: Mask ventilation without difficulty Laryngoscope Size: Mac and 3 Grade View: Grade I Tube type: Oral Tube size: 7.0 mm Number of attempts: 1 Airway Equipment and Method: Stylet Placement Confirmation: ETT inserted through vocal cords under direct vision,  breath sounds checked- equal and bilateral and positive ETCO2 Secured at: 21 cm Tube secured with: Tape Dental Injury: Teeth and Oropharynx as per pre-operative  assessment

## 2012-08-17 NOTE — Transfer of Care (Signed)
Immediate Anesthesia Transfer of Care Note  Patient: Connie Wagner  Procedure(s) Performed: Procedure(s) (LRB) with comments: OPEN REDUCTION INTERNAL FIXATION (ORIF) DISTAL HUMERUS FRACTURE (Right) - ORIF RIGHT SUPRACHONDULAR HUMERUS FRACTURE HARVEST ILIAC BONE GRAFT (Right)  Patient Location: PACU  Anesthesia Type:General  Level of Consciousness: awake, alert  and oriented  Airway & Oxygen Therapy: Patient Spontanous Breathing and Patient connected to nasal cannula oxygen  Post-op Assessment: Report given to PACU RN  Post vital signs: Reviewed and stable  Complications: No apparent anesthesia complications

## 2012-08-17 NOTE — Anesthesia Postprocedure Evaluation (Signed)
  Anesthesia Post-op Note  Patient: Connie Wagner  Procedure(s) Performed: Procedure(s) (LRB) with comments: OPEN REDUCTION INTERNAL FIXATION (ORIF) DISTAL HUMERUS FRACTURE (Right) - ORIF RIGHT SUPRACHONDULAR HUMERUS FRACTURE HARVEST ILIAC BONE GRAFT (Right)  Patient Location: PACU  Anesthesia Type:GA combined with regional for post-op pain  Level of Consciousness: awake and alert   Airway and Oxygen Therapy: Patient Spontanous Breathing and Patient connected to nasal cannula oxygen  Post-op Pain: moderate  Post-op Assessment: Post-op Vital signs reviewed  Post-op Vital Signs: Reviewed  Complications: No apparent anesthesia complications

## 2012-08-17 NOTE — Preoperative (Signed)
Beta Blockers   Reason not to administer Beta Blockers:Not Applicable 

## 2012-08-17 NOTE — Progress Notes (Signed)
Pt complained of itching Dr. Ivin Booty  at bedside . benedryl ordered. patient's pain has decreased states at a  5/10 , coughing and clearing throat. Has had a small amount of ice chips taken without difficulty. Dr. Ivin Booty feels the block has given some relief

## 2012-08-17 NOTE — Progress Notes (Signed)
Itching appears to have stopped patient dozing easily arouses pain 4-5/10 respirations regular and even

## 2012-08-17 NOTE — H&P (Signed)
PREOPERATIVE H&P  Chief Complaint: RIGHT HUMERUS FRACTURE nonunion supracondylar  HPI: Connie Wagner is a 78 y.o. female who presents for preoperative history and physical with a diagnosis of RIGHT NONUNION SUPRACONDYLAR HUMERUS FRACTURE. Symptoms are rated as moderate to severe, and have been worsening.  This is significantly impairing activities of daily living.  She has elected for surgical management. She tried to live with her fracture, almost up for a year, but continued to have severe pain. Initially I recommended surgical intervention, but she elected for nonsurgical management, until she could no longer stand it.  Past Medical History  Diagnosis Date  . Blood transfusion   . Anemia   . Upper GI bleeding 09/14/11    "have had it once before today"  . Migraines     "haven't had one since going thru menopause"  . Depression   . Anxiety   . Rheumatoid arthritis   . Chronic lower back pain   . Hx: UTI (urinary tract infection)     "every now and then"  . Hypertension     pcp dr Pete Glatter  . Dementia     needs redirection freq.   Past Surgical History  Procedure Date  . Back surgery   . Joint replacement   . Lumbar disc surgery 09/2002    Hemilaminectomy, L3, complete laminectomy, L4, hemi- semilaminectomy, L5 and right S1, and diskectomy, L5-S1.  Marland Kitchen Total knee arthroplasty 01/2002    right  . Incision and drainage of wound 10/2002    lumbar wound infection  . Knee arthroscopy 01/2001    right  . Hematoma evacuation 10/2002    lumbar  . Lumbar laminectomy 10/2003    for epidural abscess evacuation; L4-5  . Breast lumpectomy     "benign tumor removed"  . Esophagogastroduodenoscopy 09/15/2011    Procedure: ESOPHAGOGASTRODUODENOSCOPY (EGD);  Surgeon: Barrie Folk, MD;  Location: [facility] ENDOSCOPY;  Service: Endoscopy;  Laterality: N/A;   History   Social History  . Marital Status: Widowed    Spouse Name: N/A    Number of Children: N/A  . Years of Education: N/A   Social  History Main Topics  . Smoking status: Never Smoker   . Smokeless tobacco: Never Used  . Alcohol Use: 3.0 oz/week    5 Glasses of wine per week  . Drug Use: No  . Sexually Active: No   Other Topics Concern  . Not on file   Social History Narrative  . No narrative on file   Family History  Problem Relation Age of Onset  . Anesthesia problems Neg Hx   . Hypotension Neg Hx   . Malignant hyperthermia Neg Hx   . Pseudochol deficiency Neg Hx    Allergies  Allergen Reactions  . Penicillins Rash  . Sulfa Drugs Cross Reactors Rash   Prior to Admission medications   Medication Sig Start Date End Date Taking? Authorizing Provider  amLODipine (NORVASC) 10 MG tablet Take 10 mg by mouth daily.   Yes Historical Provider, MD  hydrochlorothiazide (MICROZIDE) 12.5 MG capsule Take 12.5 mg by mouth daily.     Yes Historical Provider, MD  zolpidem (AMBIEN) 5 MG tablet Take 5 mg by mouth at bedtime.   Yes Historical Provider, MD  ferrous sulfate 325 (65 FE) MG tablet Take 325 mg by mouth daily with breakfast.    Historical Provider, MD     Positive ROS: All other systems have been reviewed and were otherwise negative with the exception of those  mentioned in the HPI and as above.  Physical Exam: General: Alert, no acute distress Cardiovascular: No pedal edema Respiratory: No cyanosis, no use of accessory musculature GI: No organomegaly, abdomen is soft and non-tender Skin: No lesions in the area of chief complaint Neurologic: Sensation intact distally Psychiatric: Patient is competent for consent with normal mood and affect Lymphatic: No axillary or cervical lymphadenopathy  MUSCULOSKELETAL: All fingers flex extend and abduct on the right hand. She has crepitance with extreme limitations with range of motion of the right elbow.  Assessment: Right supracondylar humerus fracture nonunion  Plan: Plan for Procedure(s): OPEN REDUCTION INTERNAL FIXATION (ORIF) DISTAL HUMERUS FRACTURE with  autologous iliac crest bone grafting, olecranon osteotomy and ulnar nerve transposition  The risks benefits and alternatives were discussed with the patient including but not limited to the risks of nonoperative treatment, versus surgical intervention including infection, bleeding, nerve injury,  blood clots, cardiopulmonary complications, morbidity, mortality, among others, and they were willing to proceed. We've also discussed the risks for nonunion, malunion, persistent pain, the need for revision surgery, hardware failure, the potential for total elbow arthroplasty, ulnar neuritis, regional pain, among others.  Leshon Armistead P, MD Cell [phone] Pager [phone]  08/17/2012 9:52 AM

## 2012-08-17 NOTE — Anesthesia Preprocedure Evaluation (Addendum)
Anesthesia Evaluation  Patient identified by MRN, date of birth, ID band Patient awake    Reviewed: Allergy & Precautions, H&P , NPO status , Patient's Chart, lab work & pertinent test results  Airway Mallampati: II TM Distance: >3 FB Neck ROM: Full    Dental  (+) Teeth Intact, Dental Advisory Given and Chipped,    Pulmonary  breath sounds clear to auscultation        Cardiovascular hypertension, Pt. on medications Rhythm:regular Rate:Normal     Neuro/Psych PSYCHIATRIC DISORDERS Anxiety Depression    GI/Hepatic PUD,   Endo/Other    Renal/GU Renal disease     Musculoskeletal  (+) Arthritis -,   Abdominal   Peds  Hematology   Anesthesia Other Findings   Reproductive/Obstetrics                          Anesthesia Physical Anesthesia Plan  ASA: II  Anesthesia Plan: General   Post-op Pain Management: MAC Combined w/ Regional for Post-op pain   Induction: Intravenous  Airway Management Planned: Oral ETT  Additional Equipment:   Intra-op Plan:   Post-operative Plan: Extubation in OR  Informed Consent: I have reviewed the patients History and Physical, chart, labs and discussed the procedure including the risks, benefits and alternatives for the proposed anesthesia with the patient or authorized representative who has indicated his/her understanding and acceptance.     Plan Discussed with: CRNA, Anesthesiologist and Surgeon  Anesthesia Plan Comments:         Anesthesia Quick Evaluation

## 2012-08-17 NOTE — Anesthesia Procedure Notes (Signed)
Anesthesia Regional Block:  Supraclavicular block  Pre-Anesthetic Checklist: ,, timeout performed, Correct Patient, Correct Site, Correct Laterality, Correct Procedure, Correct Position, site marked, Risks and benefits discussed,  Surgical consent,  Pre-op evaluation,  At surgeon's request and post-op pain management  Laterality: Right and Upper  Prep: chloraprep       Needles:  Injection technique: Single-shot  Needle Type: Echogenic Needle     Needle Length: 5cm 5 cm Needle Gauge: 21 and 21 G    Additional Needles: Supraclavicular block Narrative:  Start time: 08/17/2012 7:22 PM End time: 08/17/2012 7:29 PM Injection made incrementally with aspirations every 5 mL.  Performed by: Personally  Anesthesiologist: Sheldon Silvan  Additional Notes: Pt presented in PACU and continued to have fairly severe pain with movement.  I felt that a repeat block should be tried. She had very difficult anatomy  Even with US guidance.  I was unable to obtain a picture due to the poor quality of the image.   Supraclavicular block

## 2012-08-17 NOTE — Progress Notes (Signed)
Pt coughing trying to clear throat , states it feels a little better.

## 2012-08-17 NOTE — Op Note (Signed)
08/17/2012  6:22 PM  PATIENT:  Connie Wagner    PRE-OPERATIVE DIAGNOSIS:  RIGHT SUPRACONDYLAR humerus. FRACTURE NONUNION  POST-OPERATIVE DIAGNOSIS:  Same  PROCEDURE:  Open reduction internal fixation right supracondylar humerus fracture nonunion with autologous iliac crest bone graft and placement of BMP.  SURGEON:  Eulas Post, MD  PHYSICIAN ASSISTANT: Janace Litten, OPA-C, present and scrubbed throughout the case, critical for completion in a timely fashion, and for retraction, instrumentation, and closure.  ANESTHESIA:   General  PREOPERATIVE INDICATIONS:  Connie Wagner is a  78 y.o. female who had a supracondylar humerus fracture approximately 12 months ago. She elected for nonsurgical management, but unfortunately never healed her fracture. She had severe pain with ongoing dysfunction, and elected for surgical management.    The risks benefits and alternatives were discussed with the patient preoperatively including but not limited to the risks of infection, bleeding, nerve injury, cardiopulmonary complications, the need for revision surgery, among others, and the patient was willing to proceed. We also discussed the potential for need for conversion to total elbow replacement, as well as hardware prominence, hardware failure, ulnar nerve dysfunction, regional pain syndrome, loss of mobility, nonunion, malunion, among others and she was willing to proceed.  OPERATIVE IMPLANTS: Biomet distal humeral locking plates, medial and lateral plates. I also used autologous iliac crest bone graft as well as BMP. I used K wires and 2 18-gauge wire for the olecranon osteotomy.  OPERATIVE FINDINGS: Significant osteopenia, with chronic nonunion of supracondylar humerus fracture.  OPERATIVE PROCEDURE: The patient was brought to the operating room and placed in the supine position. General anesthesia was administered. IV antibiotics were given. The right hip was prepped and draped in usual  sterile fashion and time out performed. Incision was made over the iliac crest anteriorly. Dissection was carried down, and then an osteotome was used to open the iliac crest. I took a window out, and then harvested iliac crest cancellus bone. I then replaced the window. I irrigated the wound and repaired the fascia with 0 Vicryl followed by 3-0 for subcutaneous tissue and then 4-0 Monocryl for the skin. A sterile dressing was applied.  I then performed a separate prep and drape, and turned the patient into the lateral decubitus position and all prominences were padded. She did have a Foley. The right upper extremity was prepped and draped in usual sterile fashion. Posterior incision was made. Dissection was carried down to the olecranon, and the ulnar nerve was identified, mobilized, and transposed. I excised the medial intermuscular septum, and released the distal fascia to ensure that the nerve would not be under any tension.  I then placed a K wire into the site of my plan the olecranon osteotomy. This was confirmed with C-arm. I then performed a Chevron type osteotomy. This was completed with an osteotome. The olecranon was reflected proximally, along with the triceps tendon, and the nonunion site exposed. A rongeur was used to remove the fibrous interposed tissue, and I also released the fibrous tissue with a knife. I mobilized the tissue medially and laterally. I exposed the fracture sites both proximally and distally, and curetted them to get fresh reedy bleeding bone. I also used a K wire to drill the bone to optimize healing.  I then placed the bone graft from the iliac crest into the cancellus surfaces, packing the graft around the fracture sites.  I then reduced the fracture anatomically, and placed a medial and lateral K wire for provisional fixation. I applied  a medial plate, and secured it proximally with a cortical screw, and then distally with a locking screw. This screw went basically  directly through the olecranon fossa, and although it was present within the fossa, there was missing bone in this location, and I did have good hold on the lateral side of the articular block. Therefore I accepted this position of the screw, and turned my attention to the lateral side.  This screw hole was the only one that would provide satisfactory bony fixation. The other screw holes were either within the fracture, above the fracture, or not in a trajectory that would be beneficial.  I then turned to the lateral side, and evaluated the use of either a lateral plate or a posterior plate, and a lateral plate provided better direction for the screws. Therefore I applied a distal lateral plate into the appropriate position and fixed it proximally with a cortical screw, and then placed 2 distal locking screws through the lateral side. I had excellent fixation. I tried to place one final screw through the very distal hole, although it was interfering with the screw from the contralateral side, so I left this short, placing a locking screw unicortically for additional rotational control.  I then finalized the fixation proximally with additional cortical screws, and review the final construct. The entire distal block moved as a unit. I was very happy with the fixation given the poor quality of bone, as well as the chronic nonunion status.  There was no hardware prominence within the joint, either by direct visualization, or by radiographic imaging, and the elbow range of motion was smooth without any mechanical clunks or catching.  I then placed BMP around the fracture edges, and packed into any surface that was still available, and I elected to do this because of the risk of nonunion, and the fact that we had all of the odd stacked against [place] in this case. If this fails then she will need a total elbow replacement, which carries its own significant risks.  I reduced the olecranon osteotomy anatomically, and  placed 2 K wires, followed by 2 18-gauge wires and a figure-of-eight type fashion, using a 14-gauge Angiocath to pass the wires through the triceps adjacent to the bone just proximal to the K wires. Both K wires were bicortical. They were also extra-articular. I used C-arm to confirm position of all the hardware, and then tensioned the wires using a figure-of-eight cerclage-type technique, and then bent and cut the wire and then bent and cut the K wires, and then tamped the K wires down through the triceps tendon. Final C-arm pictures were taken and the elbow had a smooth arc of motion.  I then prepared a soft tissue bed along the anterior medial elbow for the ulnar nerve transposition. I closed the soft tissue interval over the plate both medially and laterally to the triceps, obliterating the cubital fossa.  I used 0 Vicryl to build a wall to hold the ulnar nerve in its new transposed position, and used a Therapist, nutritional to make sure that there was nothing tensioning on the nerve. The nerve was completely free.  I irrigated once more, and repaired the subcutaneous tissue with Vicryl, followed by Monocryl with Steri-Strips sterile gauze and a posterior splint. She was awakened and returned to the PACU in stable and satisfactory condition. There no complications and she tolerated the procedure well.

## 2012-08-17 NOTE — Progress Notes (Signed)
Dr Ivin Booty did a block at the bedside with doppler in use

## 2012-08-18 DIAGNOSIS — F32A Depression, unspecified: Secondary | ICD-10-CM | POA: Diagnosis present

## 2012-08-18 DIAGNOSIS — F0390 Unspecified dementia without behavioral disturbance: Secondary | ICD-10-CM | POA: Diagnosis present

## 2012-08-18 DIAGNOSIS — F329 Major depressive disorder, single episode, unspecified: Secondary | ICD-10-CM | POA: Diagnosis present

## 2012-08-18 LAB — BASIC METABOLIC PANEL
BUN: 12 mg/dL (ref 6–23)
Calcium: 8.4 mg/dL (ref 8.4–10.5)
Creatinine, Ser: 0.79 mg/dL (ref 0.50–1.10)
GFR calc non Af Amer: 76 mL/min — ABNORMAL LOW (ref >90)
Glucose, Bld: 121 mg/dL — ABNORMAL HIGH (ref 70–99)
Potassium: 3.6 mEq/L (ref 3.5–5.1)

## 2012-08-18 LAB — CBC
HCT: 28.1 % — ABNORMAL LOW (ref 36.0–46.0)
Hemoglobin: 8.7 g/dL — ABNORMAL LOW (ref 12.0–15.0)
MCH: 26.6 pg (ref 26.0–34.0)
MCHC: 31 g/dL (ref 30.0–36.0)
MCV: 85.9 fL (ref 78.0–100.0)

## 2012-08-18 MED ORDER — ACETAMINOPHEN 10 MG/ML IV SOLN
1000.0000 mg | Freq: Four times a day (QID) | INTRAVENOUS | Status: DC
  Filled 2012-08-18 (×4): qty 100

## 2012-08-18 MED ORDER — MORPHINE SULFATE 2 MG/ML IJ SOLN
2.0000 mg | INTRAMUSCULAR | Status: DC | PRN
  Administered 2012-08-18: 2 mg via INTRAVENOUS
  Filled 2012-08-18: qty 1

## 2012-08-18 MED ORDER — MORPHINE SULFATE 2 MG/ML IJ SOLN
1.0000 mg | INTRAMUSCULAR | Status: DC | PRN
  Administered 2012-08-18: 2 mg via INTRAVENOUS
  Filled 2012-08-18: qty 1

## 2012-08-18 NOTE — Progress Notes (Signed)
Occupational Therapy Evaluation Patient Details Name: Connie Wagner MRN: [id number] DOB: 08-Oct-1928 Today's Date: 08/18/2012 Time: 1300-1340 OT Time Calculation (min): 40 min  OT Assessment / Plan / Recommendation Clinical Impression  Pleasant 78 yo s/p R ORIF distal humerus fx with iliac bone graft.PTA, pt lived in independent living. Pt presents with mobility and ADLdeficits. Pt not safe for D/C home to independent living at this time due to weakness, balance deficits and baseline cognitive deficits. Pt will need short rehab stay prior to D/C back to independent living. Pt will benefit from acute OT skilled services to max independence with ADL and functional mobility for ADL to facilitate D/C to next venue of care.    OT Assessment  Patient needs continued OT Services    Follow Up Recommendations  SNF    Barriers to Discharge Decreased caregiver support caregiver only available PRN  Equipment Recommendations  3 in 1 bedside comode    Recommendations for Other Services  none  Frequency  Min 3X/week    Precautions / Restrictions Precautions Precautions: Shoulder Type of Shoulder Precautions: distal humerus fx Precaution Comments: per Landau - wrist and digit ROM Required Braces or Orthoses: Other Brace/Splint (sling) Restrictions Weight Bearing Restrictions: Yes RUE Weight Bearing: Non weight bearing   Pertinent Vitals/Pain Did not rate but c/o pain. nsg notified    ADL  Eating/Feeding: Set up Where Assessed - Eating/Feeding: Chair Grooming: Moderate assistance Where Assessed - Grooming: Unsupported sitting Upper Body Bathing: Minimal assistance Where Assessed - Upper Body Bathing: Unsupported sitting Lower Body Bathing: Minimal assistance Where Assessed - Lower Body Bathing: Supported sit to stand Upper Body Dressing: Moderate assistance Where Assessed - Upper Body Dressing: Unsupported sitting Lower Body Dressing: Moderate assistance Where Assessed - Lower Body  Dressing: Supported sit to Pharmacist, hospital: Maximal assistance Toilet Transfer Method: Sit to Barista: Bedside commode Toileting - Clothing Manipulation and Hygiene: Moderate assistance Where Assessed - Toileting Clothing Manipulation and Hygiene: Sit to stand from 3-in-1 or toilet;Standing Equipment Used: Gait belt;Cane Transfers/Ambulation Related to ADLs: sit - stand Mod A. PT lost balance and trying to push up with RUE. Max A with ambulation due to weakness, poor balance and sore R hip. ADL Comments: limited by decreased use RUE and decreased balance. Taught pt 1 handed technique for donning socks    OT Diagnosis: Generalized weakness;Cognitive deficits;Acute pain  OT Problem List: Decreased strength;Decreased range of motion;Decreased activity tolerance;Impaired balance (sitting and/or standing);Decreased cognition;Decreased safety awareness;Decreased knowledge of use of DME or AE;Decreased knowledge of precautions;Impaired UE functional use;Pain OT Treatment Interventions: Self-care/ADL training;Therapeutic exercise;Energy conservation;DME and/or AE instruction;Therapeutic activities;Patient/family education;Cognitive remediation/compensation;Balance training   OT Goals Acute Rehab OT Goals OT Goal Formulation: With patient Time For Goal Achievement: 09/01/12 Potential to Achieve Goals: Good ADL Goals Pt Will Perform Grooming: with min assist;Supported ADL Goal: Grooming - Progress: Goal set today Pt Will Transfer to Toilet: with min assist;with DME;Ambulation;3-in-1 ADL Goal: Toilet Transfer - Progress: Goal set today Pt Will Perform Toileting - Clothing Manipulation: with set-up;Standing;Sitting on 3-in-1 or toilet ADL Goal: Toileting - Clothing Manipulation - Progress: Goal set today Pt Will Perform Toileting - Hygiene: with set-up;with supervision;Sit to stand from 3-in-1/toilet;Standing at 3-in-1/toilet ADL Goal: Toileting - Hygiene - Progress: Goal  set today Additional ADL Goal #1: Pt will maintain NWB status RUE during bed mobility for ADL. ADL Goal: Additional Goal #1 - Progress: Goal set today Arm Goals Pt Will Perform AROM: to maintain range of motion;1 set;Right upper extremity (  as tolerated R wrist and hand) Arm Goal: AROM - Progress: Goal set today Additional Arm Goal #1: Pt will donn/doff sling with min A Arm Goal: Additional Goal #1 - Progress: Goal set today  Visit Information  Last OT Received On: 08/18/12 Assistance Needed: +1 (2 would be safest for ambulation)    Subjective Data      Prior Functioning     Home Living Lives With: Alone;Other (Comment) ([facility]) Available Help at Discharge: Available PRN/intermittently Type of Home: Independent living facility Home Access: Elevator Home Layout: One level Bathroom Shower/Tub: Health visitor: Handicapped height Bathroom Accessibility: Yes How Accessible: Accessible via wheelchair;Accessible via walker Home Adaptive Equipment: Hand-held shower hose;Grab bars around toilet;Grab bars in shower;Built-in shower seat Prior Function Level of Independence: Independent Able to Take Stairs?: Yes 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 most likely baseline deficits with STM and higher level processing skills    Extremity/Trunk Assessment Right Upper Extremity Assessment RUE ROM/Strength/Tone: Deficits;Unable to fully assess;Due to precautions RUE ROM/Strength/Tone Deficits: limited by splint and WBS RUE Sensation: WFL - Light Touch;WFL - Proprioception RUE Coordination: Deficits RUE  Coordination Deficits: able to use R hand as tolerated Left Upper Extremity Assessment LUE ROM/Strength/Tone: WFL for tasks assessed LUE Sensation: WFL - Light Touch;WFL - Proprioception LUE Coordination: WFL - gross/fine motor Right Lower Extremity Assessment RLE ROM/Strength/Tone: Deficits RLE ROM/Strength/Tone Deficits: 3/5 hip strength due to bone harvest RLE Sensation: WFL - Light Touch;WFL - Proprioception RLE Coordination: Deficits RLE Coordination Deficits: difficulty with bearing wt through RLE and difficulty advancing leg Left Lower Extremity Assessment LLE ROM/Strength/Tone: WFL for tasks assessed LLE Sensation: WFL - Light Touch;WFL - Proprioception LLE Coordination: WFL - gross/fine motor Trunk Assessment Trunk Assessment: Normal     Mobility Bed Mobility Bed Mobility: Rolling Left;Left Sidelying to Sit;Supine to Sit;Sitting - Scoot to Edge of Bed Rolling Left: 5: Supervision;With rail Left Sidelying to Sit: 5: Supervision;With rails;HOB elevated Supine to Sit: 5: Supervision Sitting - Scoot to Edge of Bed: 5: Supervision (cues to not WB via RUE) Transfers Transfers: Sit to Stand;Stand to Sit Sit to Stand: With upper extremity assist;2: Max assist;From chair/3-in-1 Stand to Sit: 3: Mod assist;With upper extremity assist;To chair/3-in-1 Details for Transfer Assistance: vc to not use RUE for transfer. unsteady     Shoulder Instructions     Exercise General Exercises - Upper Extremity Wrist Flexion: AROM;Right;10 reps Wrist Extension: AROM;Right;10 reps Digit Composite Flexion: AROM;Right;10 reps Composite Extension: AROM;Right;10 reps   Balance  mod A   End of Session OT - End of Session Equipment Utilized During Treatment: Gait belt Activity Tolerance: Patient tolerated treatment well Patient left: in chair;with call bell/phone within reach;with nursing in room Nurse Communication: Other (comment);Mobility status;Precautions;Weight bearing status (D/C  concerns)  GO     Connie Wagner,HILLARY 08/18/2012, 2:05 PM [facility], OTR/L  [phone] 08/18/2012

## 2012-08-18 NOTE — Progress Notes (Signed)
Clinical Impression Statement 78 yo F s/p surgery that presents to PT with greatly decreased mobility.  Discussed in detail with pt and daughter the need for increased assistance and that SNF might be best option as pt will need physical assistance at DC.  Both agreed they would like to see how pt progresses over the next 24 hours before deciding.        08/18/12 1220  PT Visit Information  Last PT Received On 08/18/12  Assistance Needed +2  PT Time Calculation  PT Start Time 1141  PT Stop Time 1215  PT Time Calculation (min) 34 min  Subjective Data  Patient Stated Goal to be able to return home  Precautions  Precautions Fall  Required Braces or Orthoses Other Brace/Splint (sling for RUE)  Restrictions  Weight Bearing Restrictions Yes  RUE Weight Bearing NWB  Home Living  Lives With Alone (At [facility])  Available Help at Discharge Other (Comment) (Looking into hiring 24 hour care)  Type of Home Independent living facility  Home Access Level entry  Home Layout One level  Home Adaptive Equipment Other (comment) (see OT note)  Prior Function  Level of Independence Independent  Vocation Retired  Geneticist, molecular No difficulties  Cognition  Overall Cognitive Status History of cognitive impairments - at baseline  Arousal/Alertness Awake/alert  Orientation Level Appears intact for tasks assessed  Behavior During Session [facility] for tasks performed  Right Lower Extremity Assessment  RLE ROM/Strength/Tone Deficits;Due to pain  RLE ROM/Strength/Tone Deficits Limited strength and difficulty tolerating WB on RLE  Left Lower Extremity Assessment  LLE ROM/Strength/Tone WFL for tasks assessed  Bed Mobility  Bed Mobility Supine to Sit  Rolling Left 3: Mod assist;With rail  Details for Bed Mobility Assistance Verbal and tactile cues for technique  Transfers  Transfers Sit to Stand;Stand to Sit;Stand Pivot Transfers  Sit to Stand 3: Mod assist;Other (comment);With  armrests;From bed;From chair/3-in-1 (Attempted multiple times.  min A was least required)  Stand to Sit 3: Mod assist;With upper extremity assist;With armrests;To chair/3-in-1 (Multiple attempts.  Least assist required was Min A,)  Stand Pivot Transfers 3: Mod assist;With armrests  Ambulation/Gait  Ambulation/Gait Assistance 3: Mod assist  Ambulation Distance (Feet) 2 Feet (took 3-4 small steps to go 2 feet)  Assistive device Other (Comment) (Pt held to PT's shoulder)  Ambulation/Gait Assistance Details Pt with difficulty accepting weight on RLE to advance LLE  Gait Pattern Step-to pattern;Decreased stride length  General Gait Details Ambulated 3 times  PT - End of Session  Equipment Utilized During Treatment (sling on RUE)  Activity Tolerance Patient limited by pain  Patient left in chair;with call bell/phone within reach;with family/visitor present  Nurse Communication Mobility status  PT Assessment  Clinical Impression Statement 78 yo F s/p surgery that presents to PT with greatly decresed mobility.  Discussed in detail with pt and daughter the ned for incresed assistance and that SNF might be best option as pt will need physical assistance at DC.  Both agreed they would like to see how pt progresses over the next 24 hours before deciding.    PT Recommendation/Assessment Patient needs continued PT services  PT Problem List Decreased strength;Decreased range of motion;Decreased balance;Decreased mobility;Decreased knowledge of use of DME;Pain  Barriers to Discharge Decreased caregiver support  PT Therapy Diagnosis  Difficulty walking  PT Plan  PT Frequency Min 5X/week  PT Treatment/Interventions Gait training;Therapeutic activities;Therapeutic exercise;Patient/family education  PT Recommendation  Recommendations for Other Services OT consult  Follow Up Recommendations  SNF;Other (comment) (return to [facility] only with 24 hour care)  PT equipment Wheelchair (measurements PT);Other  (comment) (cane or hemiwalker)  Individuals Consulted  Consulted and Agree with Results and Recommendations Patient;Family member/caregiver  Family Member Consulted daughter  Acute Rehab PT Goals  PT Goal Formulation With patient/family  Time For Goal Achievement 08/25/12  Potential to Achieve Goals Good  Pt will go Supine/Side to Sit with min assist;with HOB 0 degrees  PT Goal: Supine/Side to Sit - Progress Goal set today  Pt will go Sit to Stand with supervision  PT Goal: Sit to Stand - Progress Goal set today  Pt will Ambulate 16 - 50 feet;with min assist;with least restrictive assistive device  PT Goal: Ambulate - Progress Goal set today

## 2012-08-18 NOTE — Progress Notes (Signed)
     Orthopaedic Trauma Service (OTS)  Subjective: 1 Day Post-Op Procedure(s) (LRB): OPEN REDUCTION INTERNAL FIXATION (ORIF) DISTAL HUMERUS FRACTURE (Right) HARVEST ILIAC BONE GRAFT (Right)  Pt did not have a good night but feels better this am Denies any numbness or tingling in R arm Denies CP, No SOB No N/V Does not feel ready to go home today  Ate breakfast this am, tolerated well   Objective: Current Vitals Blood pressure 102/55, pulse 85, temperature 99 F (37.2 C), temperature source Oral, resp. rate 18, SpO2 100.00%. Vital signs in last 24 hours: Temp:  [97.4 F (36.3 C)-99.5 F (37.5 C)] 99 F (37.2 C) (12/21 0653) Pulse Rate:  [73-97] 85  (12/21 0653) Resp:  [14-30] 18  (12/21 0653) BP: (102-128)/(47-106) 102/55 mmHg (12/21 0653) SpO2:  [95 %-100 %] 100 % (12/21 0653)  Intake/Output from previous day: 12/20 0701 - 12/21 0700 In: 3655 [P.O.:480; I.V.:3175] Out: 1095 [Urine:925; Blood:170] Intake/Output      12/20 0701 - 12/21 0700 12/21 0701 - 12/22 0700   P.O. 480    I.V. 3175    Total Intake 3655    Urine 925    Blood 170    Total Output 1095    Net +2560            LABS  Basename 08/18/12 0525  HGB 8.7*    Basename 08/18/12 0525  WBC 8.0  RBC 3.27*  HCT 28.1*  PLT 197    Basename 08/18/12 0525  NA 135  K 3.6  CL 100  CO2 25  BUN 12  CREATININE 0.79  GLUCOSE 121*  CALCIUM 8.4   No results found for this basename: LABPT:2,INR:2 in the last 72 hours   Physical Exam  Gen: Awake and alert, NAD, very pleasant Lungs:Clear, unlabored Cardiac: s1 and s2, reg Abd:+ BS, NT Ext:      Right upper extremity  Splint fitting well  R/U/M/AIN, PIN motor intact  R/U/M sensation intact  Ext warm   Swelling stable  Sling fitting well   Assessment/Plan: 1 Day Post-Op Procedure(s) (LRB): OPEN REDUCTION INTERNAL FIXATION (ORIF) DISTAL HUMERUS FRACTURE (Right) HARVEST ILIAC BONE GRAFT (Right)  78 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 R distal humerus nonunion POD 1  NWB R arm  Maintain splint per Dr. Georgette Dover prn  Sling  Wrist and digit motion as tolerated  PT/OT consults 2. HTN  Continue home meds  BP stable 3. Anxiety and depression  Stable 4. Dementia 5. FEN  Reg diet  D/c foley  D/c IVF  NSL  6. Dispo  Therapies today  D/c home tomorrow   Pt to return to ALF with home health aide  Mearl Latin, PA-C [facility] [phone] (P) 08/18/2012, 9:12 AM

## 2012-08-18 NOTE — Plan of Care (Signed)
Problem: Phase I Progression Outcomes Goal: Sutures/staples intact Outcome: Not Applicable Date Met:  08/18/12 Unable to visualize incision d/t splint/ace dressing Goal: Initial discharge plan identified Outcome: Progressing OT recomments social work consult.  On-call paged to obtain order for SW.  Awaiting return call.  Placed on sticky note.

## 2012-08-19 LAB — CBC
Hemoglobin: 8.7 g/dL — ABNORMAL LOW (ref 12.0–15.0)
MCH: 26.4 pg (ref 26.0–34.0)
MCHC: 30.1 g/dL (ref 30.0–36.0)
RDW: 18.3 % — ABNORMAL HIGH (ref 11.5–15.5)

## 2012-08-19 NOTE — Progress Notes (Addendum)
OT TREATMENT  Past Medical History  Diagnosis Date  . Blood transfusion   . Anemia   . Upper GI bleeding 09/14/11    "have had it once before today"  . Migraines     "haven't had one since going thru menopause"  . Depression   . Anxiety   . Rheumatoid arthritis   . Chronic lower back pain   . Hx: UTI (urinary tract infection)     "every now and then"  . Hypertension     pcp dr Pete Glatter  . Dementia     needs redirection freq.  . 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 09/14/2011    08/19/12 1300  OT Visit Information  Last OT Received On 08/19/12  Assistance Needed +1  OT Time Calculation  OT Start Time 0855  OT Stop Time 0908  OT Time Calculation (min) 13 min  Precautions  Precautions Shoulder  Type of Shoulder Precautions distal humerus fx  Precaution Comments per Landau - wrist and digit ROM  Required Braces or Orthoses Other Brace/Splint  Restrictions  RUE Weight Bearing NWB  ADL  ADL Comments Focus of session to assess safest D/C plan. Pt requires mod A to ambulate5 feet with use of [place]  Cognition  Overall Cognitive Status History of cognitive impairments - at baseline  Arousal/Alertness Awake/alert  Orientation Level Appears intact for tasks assessed  Behavior During Session [facility] for tasks performed  Cognition - Other Comments dementia. most likely baseline deficits with STM and higher level processing skills  Bed Mobility  Bed Mobility Not assessed  Rolling Left 5: Supervision;With rail  Left Sidelying to Sit 5: Supervision;With rails;HOB elevated  Supine to Sit 5: Supervision  Sitting - Scoot to Edge of Bed 5: Supervision  Details for Bed Mobility Assistance Verbal and tactile cues for technique  Transfers  Transfers Sit to Stand;Stand to Sit  Sit to Stand 4: Min assist;With upper extremity assist  Stand to Sit 3: Mod assist  Details for Transfer Assistance Pt continues to require max  rediractional cues. Unable to maintain NWB R UE  Balance  Balance Assessed (at risk for falls)  General Exercises - Upper Extremity  Wrist Flexion AROM;Right;10 reps  Wrist Extension AROM;Right;10 reps  Digit Composite Flexion AROM;Right;10 reps  Composite Extension AROM;Right;10 reps  OT - End of Session  Equipment Utilized During Treatment Gait belt  Activity Tolerance Patient tolerated treatment well  Patient left in chair;with call bell/phone within reach;with nursing in room  Nurse Communication Other (comment);Mobility status;Precautions;Weight bearing status  OT Assessment/Plan  Comments on Treatment Session Pt making slow progress. Pt is high fall risk and risk for readmissiom. Continue to recommend SNF. Pt in agreement. Pt did not remember this therapist from yesterday.  OT Plan Discharge plan remains appropriate  OT Frequency Min 3X/week  Follow Up Recommendations Home health OT;SNF  OT Equipment 3 in 1 bedside comode  Acute Rehab OT Goals  OT Goal Formulation With patient  Time For Goal Achievement 09/01/12  Potential to Achieve Goals Good  ADL Goals  Pt Will Perform Grooming with min assist;Supported  ADL Goal: Grooming - Progress Progressing toward goals  Pt Will Transfer to Toilet with min assist;with DME;Ambulation;3-in-1  ADL Goal: Toilet Transfer - Progress Progressing toward goals  Pt Will Perform Toileting - Clothing Manipulation with set-up;Standing;Sitting on 3-in-1 or toilet  ADL Goal: Toileting - Clothing Manipulation - Progress Progressing toward goals  Pt Will Perform Toileting - Hygiene  with set-up;with supervision;Sit to stand from 3-in-1/toilet;Standing at 3-in-1/toilet  ADL Goal: Toileting - Hygiene - Progress Progressing toward goals  Additional ADL Goal #1 Pt will maintain NWB status RUE during bed mobility for ADL.  ADL Goal: Additional Goal #1 - Progress Progressing toward goals  Arm Goals  Pt Will Perform AROM to maintain range of motion;1  set;Right upper extremity  Arm Goal: AROM - Progress Progressing toward goal  Additional Arm Goal #1 Pt will donn/doff sling with min A  Arm Goal: Additional Goal #1 - Progress Progressing toward goals  OT General Charges  $OT Visit 1 Procedure  OT Treatments  $Self Care/Home Management  23-37 mins   [facility], OTR/L  [phone] 08/19/2012

## 2012-08-19 NOTE — Progress Notes (Signed)
Orthopaedic Trauma Service (OTS)  Subjective: 2 Days Post-Op Procedure(s) (LRB): OPEN REDUCTION INTERNAL FIXATION (ORIF) DISTAL HUMERUS FRACTURE (Right) HARVEST ILIAC BONE GRAFT (Right)  Doing well Feels much better than she did yesterday Agrees that a SNF would be a good choice at this time  Pt felt not to be safe to return to independent living from hospital, SW consult for SNF  Objective: Current Vitals Blood pressure 107/45, pulse 100, temperature 100.4 F (38 C), temperature source Oral, resp. rate 18, SpO2 97.00%. Vital signs in last 24 hours: Temp:  [99.1 F (37.3 C)-100.4 F (38 C)] 100.4 F (38 C) (12/22 0710) Pulse Rate:  [94-100] 100  (12/22 0710) Resp:  [17-19] 18  (12/22 0710) BP: (99-107)/(43-69) 107/45 mmHg (12/22 0710) SpO2:  [96 %-99 %] 97 % (12/22 0710)  Intake/Output from previous day: 12/21 0701 - 12/22 0700 In: 960 [P.O.:960] Out: 300 [Urine:300] Intake/Output      12/21 0701 - 12/22 0700 12/22 0701 - 12/23 0700   P.O. 960 240   I.V.     Total Intake 960 240   Urine 300    Blood     Total Output 300    Net +660 +240        Urine Occurrence 3 x 1 x     LABS  Basename 08/19/12 0505 08/18/12 0525  HGB 8.7* 8.7*    Basename 08/19/12 0505 08/18/12 0525  WBC 9.5 8.0  RBC 3.29* 3.27*  HCT 28.9* 28.1*  PLT 213 197    Basename 08/18/12 0525  NA 135  K 3.6  CL 100  CO2 25  BUN 12  CREATININE 0.79  GLUCOSE 121*  CALCIUM 8.4   No results found for this basename: LABPT:2,INR:2 in the last 72 hours    Physical Exam  Gen: Awake and alert, NAD, very pleasant  Lungs:Clear, unlabored  Cardiac: s1 and s2, reg  Abd:+ BS, NT  Ext:   Right upper extremity    Splint fitting well    R/U/M/AIN, PIN motor intact    R/U/M sensation intact    Ext warm    Swelling stable    Sling fitting well   R ICBG donor site looks fantastic    Assessment/Plan: 2 Days Post-Op Procedure(s) (LRB): OPEN REDUCTION INTERNAL FIXATION (ORIF) DISTAL  HUMERUS FRACTURE (Right) HARVEST ILIAC BONE GRAFT (Right)  78 y/o female s/p repair of R distal humerus nonunion   1. R distal humerus nonunion POD 2  NWB R arm   Maintain splint per Dr. Georgette Dover prn   Sling   Wrist and digit motion as tolerated   PT/OT   2. HTN   Continue home meds   BP stable  3. Anxiety and depression   Stable  4. Dementia  5. FEN   Reg diet   NSL  6. Dispo   Therapies today   D/c to snf tomorrow    Mearl Latin, PA-C [facility] [phone] (P) 08/19/2012, 10:20 AM

## 2012-08-19 NOTE — Progress Notes (Signed)
Physical Therapy Treatment Patient Details Name: Connie Wagner MRN: [id number] DOB: [birth year] Today's Date: 08/19/2012 Time: 4098-1191 PT Time Calculation (min): 15 min  PT Assessment / Plan / Recommendation Comments on Treatment Session  Trialed ambulation with SPC today.  Pt unsteady requiring Min (A) for balance & safety.  Will trial hemiwalker vs quad cane next session.  Pt states she is aware of her deficits and feels she would best benefit from ST-SNF prior to d/cing back to [facility].      Follow Up Recommendations  SNF;Other (comment)     Does the patient have the potential to tolerate intense rehabilitation     Barriers to Discharge        Equipment Recommendations  Wheelchair (measurements PT);Cane;Other (comment) (cane or hemiwalker)    Recommendations for Other Services    Frequency Min 5X/week   Plan Discharge plan remains appropriate    Precautions / Restrictions Precautions Precautions: Shoulder Type of Shoulder Precautions: distal humerus fx Precaution Comments: per Landau - wrist and digit ROM Required Braces or Orthoses: Other Brace/Splint (sling for RUE) Restrictions Weight Bearing Restrictions: Yes RUE Weight Bearing: Non weight bearing       Mobility  Bed Mobility Bed Mobility: Not assessed Transfers Transfers: Sit to Stand;Stand to Sit Sit to Stand: 4: Min guard;With upper extremity assist;With armrests;From chair/3-in-1;From bed Stand to Sit: 4: Min guard;With upper extremity assist;With armrests;To bed;To chair/3-in-1 Details for Transfer Assistance: Reinforcement cues for NWBing RUE.  Guarding for safety due to pt mildly unsteady with standing.   Ambulation/Gait Ambulation/Gait Assistance: 4: Min assist Ambulation Distance (Feet): 10 Feet (5' x 2) Assistive device: Straight cane Ambulation/Gait Assistance Details: Pt utilized SPC in LUE; unsteady & required (A) for balance & safety.   Gait Pattern: Step-to pattern;Decreased stance time -  right;Decreased step length - left;Decreased step length - right;Decreased weight shift to right Stairs: No Wheelchair Mobility Wheelchair Mobility: No     PT Goals Acute Rehab PT Goals Time For Goal Achievement: 08/25/12 Potential to Achieve Goals: Good Pt will go Supine/Side to Sit: with min assist;with HOB 0 degrees Pt will go Sit to Stand: with supervision PT Goal: Sit to Stand - Progress: Progressing toward goal Pt will Ambulate: 16 - 50 feet;with min assist;with least restrictive assistive device PT Goal: Ambulate - Progress: Progressing toward goal  Visit Information  Last PT Received On: 08/19/12 Assistance Needed: +1    Subjective Data      Cognition  Overall Cognitive Status: History of cognitive impairments - at baseline Arousal/Alertness: Awake/alert Orientation Level: Appears intact for tasks assessed Behavior During Session: [facility] for tasks performed Cognition - Other Comments: dementia. most likely baseline deficits with STM and higher level processing skills    Balance     End of Session PT - End of Session Equipment Utilized During Treatment: Gait belt;Other (comment) (sling R UE) Activity Tolerance: Patient tolerated treatment well Patient left: in chair;with call bell/phone within reach Nurse Communication: Mobility status     Verdell Face, [place] [phone] 08/19/2012

## 2012-08-20 ENCOUNTER — Encounter (HOSPITAL_COMMUNITY): Payer: Self-pay | Admitting: Orthopedic Surgery

## 2012-08-20 LAB — CBC
HCT: 30.8 % — ABNORMAL LOW (ref 36.0–46.0)
MCHC: 30.8 g/dL (ref 30.0–36.0)
MCV: 88 fL (ref 78.0–100.0)
RDW: 18.6 % — ABNORMAL HIGH (ref 11.5–15.5)

## 2012-08-20 NOTE — Discharge Summary (Addendum)
Physician Discharge Summary  Patient ID: Connie Wagner MRN: [id number] DOB/AGE: Mar 22, 1929 78 y.o.  Admit date: 08/17/2012 Discharge date: 08/21/2012  Admission Diagnoses:  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  Discharge Diagnoses:  Principal Problem:  *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 Active Problems:  Anxiety  Hypertension  Dementia  Depression  Urinary tract infection, E. coli   Past Medical History  Diagnosis Date  . Blood transfusion   . Anemia   . Upper GI bleeding 09/14/11    "have had it once before today"  . Migraines     "haven't had one since going thru menopause"  . Depression   . Anxiety   . Rheumatoid arthritis   . Chronic lower back pain   . Hx: UTI (urinary tract infection)     "every now and then"  . Hypertension     pcp dr Pete Glatter  . Dementia     needs redirection freq.  . 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 09/14/2011    Surgeries: Procedure(s): OPEN REDUCTION INTERNAL FIXATION (ORIF) DISTAL HUMERUS FRACTURE HARVEST ILIAC BONE GRAFT on 08/17/2012   Consultants (if any):    Discharged Condition: Improved  Hospital Course: Connie Wagner is an 78 y.o. female who was admitted 08/17/2012 with a diagnosis of 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 and went to the operating room on 08/17/2012 and underwent the above named procedures.  She was neurovascularly intact postoperatively, with intact ulnar nerve function.  She was given perioperative antibiotics:      Anti-infectives     Start     Dose/Rate Route Frequency Ordered Stop   08/21/12 1100   ciprofloxacin (CIPRO) tablet 500 mg        500 mg Oral 2 times daily 08/21/12 1014     08/21/12 0000    ciprofloxacin (CIPRO) 500 MG tablet        500 mg Oral 2 times daily 08/21/12 1016     08/17/12 2130   vancomycin (VANCOCIN) IVPB 1000 mg/200 mL premix        1,000 mg 200 mL/hr over 60 Minutes Intravenous Every 12 hours 08/17/12 2120 08/17/12 2308   08/16/12 1415   vancomycin (VANCOCIN) IVPB 1000 mg/200 mL premix        1,000 mg 200 mL/hr over 60 Minutes Intravenous 60 min pre-op 08/16/12 1415 08/17/12 1330        .  She was given sequential compression devices, early ambulation for DVT prophylaxis.  She benefited maximally from the hospital stay and there were no complications.  She had asymptomatic bacteruria with ecoli, and was given cipro for 5 days.  Recent vital signs:  Filed Vitals:   08/21/12 0938  BP: 135/63  Pulse:   Temp:   Resp:     Recent laboratory studies:  Lab Results  Component Value Date   HGB 9.5* 08/20/2012   HGB 8.7* 08/19/2012   HGB 8.7* 08/18/2012   Lab Results  Component Value Date   WBC 10.2 08/20/2012   PLT 226 08/20/2012   Lab Results  Component Value Date   INR 1.00 08/14/2012   Lab Results  Component Value Date   NA 135 08/18/2012   K 3.6 08/18/2012   CL 100 08/18/2012   CO2 25 08/18/2012   BUN 12 08/18/2012  CREATININE 0.79 08/18/2012   GLUCOSE 121* 08/18/2012    Discharge Medications:     Medication List     As of 08/21/2012 10:17 AM    TAKE these medications         amLODipine 10 MG tablet   Commonly known as: NORVASC   Take 10 mg by mouth daily.      ciprofloxacin 500 MG tablet   Commonly known as: CIPRO   Take 1 tablet (500 mg total) by mouth 2 (two) times daily.      ferrous sulfate 325 (65 FE) MG tablet   Take 325 mg by mouth daily with breakfast.      hydrochlorothiazide 12.5 MG capsule   Commonly known as: MICROZIDE   Take 12.5 mg by mouth daily.      HYDROcodone-acetaminophen 10-325 MG per tablet   Commonly known as: NORCO   Take 1-2 tablets by mouth every 6 (six) hours as needed for pain.       ondansetron 4 MG tablet   Commonly known as: ZOFRAN   Take 1 tablet (4 mg total) by mouth every 8 (eight) hours as needed for nausea.      sennosides-docusate sodium 8.6-50 MG tablet   Commonly known as: SENOKOT-S   Take 1 tablet by mouth daily.      zolpidem 5 MG tablet   Commonly known as: AMBIEN   Take 5 mg by mouth at bedtime.          Diagnostic Studies: Dg Chest 2 View  08/14/2012  *RADIOLOGY REPORT*  Clinical Data: Preop right elbow surgery.  Hypertension.  CHEST - 2 VIEW  Comparison: 09/14/2011  Findings: Heart and mediastinal contours are within normal limits. No focal opacities or effusions.  No acute bony abnormality. Moderate compression fractures in the lower thoracic and upper lumbar spine are stable.  Degenerative changes in the thoracic spine.  IMPRESSION: No acute cardiopulmonary disease.   Original Report Authenticated By: Charlett Nose, M.D.    Dg Elbow 2 Views Right  08/17/2012  *RADIOLOGY REPORT*  Clinical Data: Operative reduction and internal fixation of the right elbow.  DG C-ARM GT 120 MIN,RIGHT ELBOW - 2 VIEW  Comparison:  08/10/2012  Findings: Medial and lateral plate screw fixators along the distal uterus and epicondyles noted with screws traversing the previously demonstrated area of distal humeral nonunion.  There is much improved alignment, with nonvisualization of the fracture plane likely reflecting graft and curetted nonunion margins.  Olecranon figure-8 cerclage and K-wires noted.  Radiocapitellar alignment appears normal.  Overall alignment is near anatomic.  IMPRESSION:  1.  Interval open reduction and internal fixation of the right supracondylar humeral fracture with olecranon osteotomy, with near anatomic resulting alignment and positioning.   Original Report Authenticated By: Gaylyn Rong, M.D.    Ct Elbow Right W/o Cm  08/10/2012  *RADIOLOGY REPORT*  Clinical Data:  History of fall 1 year ago with a fracture. Patient scheduled for surgery for  revision.  CT RIGHT ELBOW WITHOUT CONTRAST  Technique:  Multidetector CT imaging of the right elbow was performed according to the standard protocol without intravenous contrast. Multiplanar CT image reconstructions were also generated.  Comparison:  Plain film 05/18/2012.  Findings:  The patient has a transcondylar fracture of the distal humerus which is a complete nonunion with cortical bone seen on both sides of the fracture site throughout.  The fracture is distracted 0.6 cm. The fracture fragment is superiorly displaced approximately 1 cm and rotated anteriorly.  Several  small loose bodies are present about the fracture site.  No other fracture is identified.  The patient has degenerative disease about the elbow which appears worst at the radiocapitellar joint where collar osteophytes are seen about the radius.  There is a moderately large elbow joint effusion.  Soft tissue swelling is present about the elbow.  IMPRESSION:  1.  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 2.  Degenerative disease about the elbow. 3.  Moderately large elbow joint effusion.  *RADIOLOGY REPORT*  3-DIMENSIONAL CT IMAGE RENDERING AT INDEPENDENT WORKSTATION:  Technique:  3-dimensional CT images were rendered by post- processing of the original CT data at independent workstation.  The 3-dimensional CT images were interpreted, and findings were reported in the accompanying complete CT report for this study.  Three dimensional reconstructions were performed by the radiologist at an independent workstation.   Original Report Authenticated By: Holley Dexter, M.D.    Ct 3d Independent Annabell Sabal  08/10/2012  *RADIOLOGY REPORT*  Clinical Data:  History of fall 1 year ago with a fracture. Patient scheduled for surgery for revision.  CT RIGHT ELBOW WITHOUT CONTRAST  Technique:  Multidetector CT imaging of the right elbow was performed according to the standard protocol without intravenous contrast.  Multiplanar CT image reconstructions were also generated.  Comparison:  Plain film 05/18/2012.  Findings:  The patient has a transcondylar fracture of the distal humerus which is a complete nonunion with cortical bone seen on both sides of the fracture site throughout.  The fracture is distracted 0.6 cm. The fracture fragment is superiorly displaced approximately 1 cm and rotated anteriorly.  Several small loose bodies are present about the fracture site.  No other fracture is identified.  The patient has degenerative disease about the elbow which appears worst at the radiocapitellar joint where collar osteophytes are seen about the radius.  There is a moderately large elbow joint effusion.  Soft tissue swelling is present about the elbow.  IMPRESSION:  1.  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 2.  Degenerative disease about the elbow. 3.  Moderately large elbow joint effusion.  *RADIOLOGY REPORT*  3-DIMENSIONAL CT IMAGE RENDERING AT INDEPENDENT WORKSTATION:  Technique:  3-dimensional CT images were rendered by post- processing of the original CT data at independent workstation.  The 3-dimensional CT images were interpreted, and findings were reported in the accompanying complete CT report for this study.  Three dimensional reconstructions were performed by the radiologist at an independent workstation.   Original Report Authenticated By: Holley Dexter, M.D.    Mm Digital Screening  07/25/2012  *RADIOLOGY REPORT*  Clinical Data: Screening.  DIGITAL BILATERAL SCREENING MAMMOGRAM WITH CAD  Comparison:  Previous exams.  Findings:  The breast tissue is heterogeneously dense. No suspicious masses, architectural distortion, or calcifications are present.  Images were processed with CAD.  IMPRESSION: No mammographic evidence of malignancy.  A result letter of this screening mammogram will be mailed directly to the patient.  RECOMMENDATION: Screening mammogram in one year.  (Code:SM-B-01Y)  BI-RADS CATEGORY 1:  Negative.   Original Report Authenticated By: Baird Lyons, M.D.    Dg C-arm Gt 120 Min  08/17/2012  *RADIOLOGY REPORT*  Clinical Data: Operative reduction and internal fixation of the right elbow.  DG C-ARM GT 120 MIN,RIGHT ELBOW - 2 VIEW  Comparison:  08/10/2012  Findings: Medial and lateral plate screw fixators along the distal uterus and epicondyles noted with screws traversing the previously demonstrated area  of distal humeral nonunion.  There is much improved alignment, with nonvisualization of the fracture plane likely reflecting graft and curetted nonunion margins.  Olecranon figure-8 cerclage and K-wires noted.  Radiocapitellar alignment appears normal.  Overall alignment is near anatomic.  IMPRESSION:  1.  Interval open reduction and internal fixation of the right supracondylar humeral fracture with olecranon osteotomy, with near anatomic resulting alignment and positioning.   Original Report Authenticated By: Gaylyn Rong, M.D.     Disposition: 01-Home or Self Care  Discharge Orders    Future Orders Please Complete By Expires   Diet general      Call MD / Call 911      Comments:   If you experience chest pain or shortness of breath, CALL 911 and be transported to the hospital emergency room.  If you develope a fever above 101 F, pus (white drainage) or increased drainage or redness at the wound, or calf pain, call your surgeon's office.   Discharge instructions      Comments:   Change dressing in 3 days and reapply fresh dressing, unless you have a splint (half cast).  If you have a splint/cast, just leave in place until your follow-up appointment.    Keep wounds dry for 3 weeks.  Leave steri-strips in place on skin.  Do not apply lotion or anything to the wound.   Constipation Prevention      Comments:   Drink plenty of fluids.  Prune juice may be helpful.  You may use a stool softener, such as Colace (over the counter) 100 mg twice a day.   Use MiraLax (over the counter) for constipation as needed.      Follow-up Information    Follow up with Demondre Aguas P, MD. In 2 weeks.   Contact information:   [address] [place] [address] [phone]           Signed: Eulas Post 08/21/2012, 10:17 AM

## 2012-08-20 NOTE — Progress Notes (Addendum)
Clinical Social Work Department  BRIEF PSYCHOSOCIAL ASSESSMENT  Patient: Connie Wagner  Account Number: [id number]  Admit date: 08/18/12 Clinical Social Worker Date/Time:  Referred by: Physician Date Referred: 08/19/12 Referred for   SNF Placement   Other Referral:  Interview type: Patient  Other interview type:  PSYCHOSOCIAL DATA  Living Status: [place] Admitted from facility: Independent Living Level of care:  Independent Living Primary support name:Small,Lisa  Primary support relationship to patient: daughter Degree of support available:  Fair  CURRENT CONCERNS  Current Concerns   Post-Acute Placement   Other Concerns:  SOCIAL WORK ASSESSMENT / PLAN  CSW met with pt re: PT recommendation for SNF.   Pt lives in independent living at [place]  CSW explained placement process and answered questions.   Pt reports  She would like to get therapy at home with home health      Assessment/plan status: Information/Referral to [facility]  Other assessment/ plan:  Information/referral to community resources:     PTAR   PATIENT'S/FAMILY'S RESPONSE TO PLAN OF CARE:  Patient is not agreeable to [facility] placement. Pt. Would like to return home with home health and CNA assistance. Pt verbalized appreciation for CSW assist.   Sabino Niemann, MSW [phone]

## 2012-08-20 NOTE — Progress Notes (Signed)
Patient ID: Connie Wagner, female   DOB: 12-15-1928, 78 y.o.   MRN: [id number]     Subjective:  Patient reports pain as mild to moderate.  She states that she is improving everyday.  She states that her graft site is hurting more today.  Objective:   VITALS:   Filed Vitals:   08/19/12 0710 08/19/12 1252 08/19/12 2113 08/20/12 0715  BP: 107/45 100/84 114/65 134/51  Pulse: 100 92 100 105  Temp: 100.4 F (38 C) 99.5 F (37.5 C) 99.1 F (37.3 C) 100.2 F (37.9 C)  TempSrc:      Resp: 18 16 16 16   SpO2: 97% 93% 97% 98%    ABD soft Sensation intact distally Dorsiflexion/Plantar flexion intact Graft site dressing removed wound clean and dry  Dry dressing applied  LABS  Results for orders placed during the hospital encounter of 08/17/12 (from the past 24 hour(s))  CBC     Status: Abnormal   Collection Time   08/20/12  6:15 AM      Component Value Range   WBC 10.2  4.0 - 10.5 K/uL   RBC 3.50 (*) 3.87 - 5.11 MIL/uL   Hemoglobin 9.5 (*) 12.0 - 15.0 g/dL   HCT 95.6 (*) 21.3 - 08.6 %   MCV 88.0  78.0 - 100.0 fL   MCH 27.1  26.0 - 34.0 pg   MCHC 30.8  30.0 - 36.0 g/dL   RDW 57.8 (*) 46.9 - 62.9 %   Platelets 226  150 - 400 K/uL    No results found.  Assessment/Plan: 3 Days Post-Op   Principal Problem:  *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 Active Problems:  Anxiety  Hypertension  Dementia  Depression   Advance diet Up with therapy Discharge to SNF RX signed and in the patients chart.   Haskel Khan 08/20/2012, 7:41 AM   Teryl Lucy, MD Cell [phone] Pager [phone]

## 2012-08-20 NOTE — Progress Notes (Signed)
UR COMPLETED  

## 2012-08-20 NOTE — Progress Notes (Signed)
CARE MANAGEMENT NOTE 08/20/2012  Patient:  Connie Wagner, Connie Wagner   Account Number:  [id number]  Date Initiated:  08/20/2012  Documentation initiated by:  Vance Peper  Subjective/Objective Assessment:   78 yr old female s/p right humurous ORIF.     Action/Plan:   Patient is returning to [facility]. Contacted Trish with [facility] (they provide the therapy for [place]).Faxed orders to:[phone]   Anticipated DC Date:  08/20/2012   Anticipated DC Plan:  HOME W HOME HEALTH SERVICES      DC Planning Services  CM consult      [facility] Choice  HOME HEALTH   Choice offered to / List presented to:  C-1 Patient   DME arranged  CANE      DME agency  [facility]     HH arranged  HH-3 OT  HH-2 PT      HH agency  OTHER - SEE NOTE   Status of service:  Completed, signed off Medicare Important Message given?   (If response is "NO", the following Medicare IM given date fields will be blank) Date Medicare IM given:   Date Additional Medicare IM given:    Discharge Disposition:  HOME W HOME HEALTH SERVICES  Per UR Regulation:    If discussed at Long Length of Stay Meetings, dates discussed:    Comments:

## 2012-08-20 NOTE — Progress Notes (Addendum)
Physical Therapy Treatment Patient Details Name: Connie Wagner MRN: [id number] DOB: 1929/03/06 Today's Date: 08/20/2012 Time: 4098-1191 PT Time Calculation (min): 14 min  PT Assessment / Plan / Recommendation Comments on Treatment Session  Patient indicated she would have assistance at discharge at the independent facility, however, per her daughter this has not been set up.  Feel she will need STSNF stay prior to d/c home to independent facility.    Follow Up Recommendations  SNF (Pt. in error stated she would have hired help, needs SNF)                 Equipment Recommendations  Small-based quad cane        Frequency Min 5X/week   Plan Discharge plan needs to be updated    Precautions / Restrictions Precautions Precautions: Fall;Shoulder Restrictions RUE Weight Bearing: Touch down weight bearing   Pertinent Vitals/Pain No complaints with treatment, except mild right hip with mobility.    Mobility  Bed Mobility Supine to Sit: 6: Modified independent (Device/Increase time) Transfers Sit to Stand: 6: Modified independent (Device/Increase time) Stand to Sit: 6: Modified independent (Device/Increase time) Ambulation/Gait Ambulation/Gait Assistance: 5: Supervision Ambulation Distance (Feet): 40 Feet Assistive device: Small based quad cane Ambulation/Gait Assistance Details: bed to bathroom pt furniture walked, used right UE one time and cues not to use.  Encouraged to use cane at home due to misperception of reaching for furniture could cause fall.  Agrees to use quad cane.  Denies need for bed side commode. Gait Pattern: Step-to pattern;Trunk flexed      PT Goals Acute Rehab PT Goals Pt will go Supine/Side to Sit: with modified independence PT Goal: Supine/Side to Sit - Progress: Updated due to goal met Pt will go Sit to Stand: with modified independence PT Goal: Sit to Stand - Progress: Updated due to goal met Pt will Ambulate: 16 - 50 feet;with modified  independence;with least restrictive assistive device PT Goal: Ambulate - Progress: Updated due to goal met  Visit Information  Last PT Received On: 08/20/12    Subjective Data  Subjective: I've been getting around much better.  Think I can go home with the hired help I've gotten.   Cognition  Overall Cognitive Status: Appears within functional limits for tasks assessed/performed Behavior During Session: [facility] for tasks performed    Balance  Balance Balance Assessed: Yes Static Standing Balance Static Standing - Balance Support: Left upper extremity supported Static Standing - Level of Assistance: 6: Modified independent (Device/Increase time)  End of Session PT - End of Session Activity Tolerance: Patient tolerated treatment well Patient left: in bed;with call bell/phone within reach   GP     [facility] 08/20/2012, 4:19 PM [place], [place] [phone] 08/20/2012

## 2012-08-20 NOTE — Progress Notes (Signed)
CARE MANAGEMENT NOTE 08/20/2012  Patient:  Connie Wagner, Connie Wagner   Account Number:  [id number]  Date Initiated:  08/20/2012  Documentation initiated by:  Connie Wagner  Subjective/Objective Assessment:   78 yr old female s/p right humurous ORIF.       Comments:  08/20/12 1540 Connie Peper, RN BSN Received call Connie Wagner stating that [facility] doesnt contract with [employer]. In the interim patient's daughter Connie Wagner called and stated that her mom can not care for herself, adminster her own meds independently at this time. patient had also informed therapist that she was privately hiring care givers, daughter states this also is not true, she will not have 24/7 assistance. Informed Child psychotherapist of this and she will speak with Connie Wagner.   08/20/12 15:27 Connie Wagner BSN CM received a call from [place] with [employer]. States she has received faved orders, also stated the facility uses [facility]. Patient will receive a HC and I should arrange therapies through them. Called Connie Wagner to give referral.

## 2012-08-21 DIAGNOSIS — B9620 Unspecified Escherichia coli [E. coli] as the cause of diseases classified elsewhere: Secondary | ICD-10-CM | POA: Diagnosis present

## 2012-08-21 DIAGNOSIS — N390 Urinary tract infection, site not specified: Secondary | ICD-10-CM | POA: Diagnosis present

## 2012-08-21 MED ORDER — CIPROFLOXACIN HCL 500 MG PO TABS
500.0000 mg | ORAL_TABLET | Freq: Two times a day (BID) | ORAL | Status: DC
  Administered 2012-08-21: 500 mg via ORAL
  Filled 2012-08-21 (×3): qty 1

## 2012-08-21 MED ORDER — CIPROFLOXACIN HCL 500 MG PO TABS: 500.0000 mg | ORAL_TABLET | Freq: Two times a day (BID) | ORAL | Status: DC

## 2012-08-21 NOTE — Clinical Social Work Placement (Signed)
   Clinical Social Work Department  CLINICAL SOCIAL WORK PLACEMENT NOTE  08/20/12  Patient: Connie Wagner  Account Number: [id number] Admit date: 08/17/12  Clinical Social Worker: Sabino Niemann MSW Date/time: 08/20/12 5:00 PM  Clinical Social Work is seeking post-discharge placement for this patient at the following level of care: SKILLED NURSING (*CSW will update this form in Epic as items are completed)  08/20/12 Patient/family provided with [facility] Department of Clinical Social Work's list of facilities offering this level of care within the geographic area requested by the patient (or if unable, by the patient's family).  12/23/13Patient/family informed of their freedom to choose among providers that offer the needed level of care, that participate in Medicare, Medicaid or managed care program needed by the patient, have an available bed and are willing to accept the patient.  08/20/12 Patient/family informed of [facility]' ownership interest in [facility], as well as of the fact that they are under no obligation to receive care at this facility.  PASARR submitted to EDS on 08/20/12  PASARR number received from EDS on 08/20/12  FL2 transmitted to all facilities in geographic area requested by pt/family on 08/20/12  FL2 transmitted to all facilities within larger geographic area on  Patient informed that his/her managed care company has contracts with or will negotiate with certain facilities, including the following:  Patient/family informed of bed offers received: 08/21/12  Patient chooses bed at [facility]  Physician recommends and patient chooses bed at  Patient to be transferred to on 08/21/12  Patient to be transferred to facility by Private Vehicle  The following physician request were entered in Epic:  Additional Comments:  Sabino Niemann, MSW  [phone]

## 2012-08-21 NOTE — Progress Notes (Signed)
Physical Therapy Treatment Patient Details Name: Connie Wagner MRN: [id number] DOB: [birth year] Today's Date: 08/21/2012 Time: 0900-0920 PT Time Calculation (min): 20 min  PT Assessment / Plan / Recommendation Comments on Treatment Session  Patient progressing but still showing some safety concerns with decreased balance and risk for falling. Patient states she doesn't have 24 hour care, awaiting SNF    Follow Up Recommendations  SNF     Does the patient have the potential to tolerate intense rehabilitation     Barriers to Discharge        Equipment Recommendations  Small-based quad cane    Recommendations for Other Services    Frequency Min 5X/week   Plan      Precautions / Restrictions Precautions Precautions: Fall;Shoulder Required Braces or Orthoses: Other Brace/Splint Restrictions RUE Weight Bearing: Non weight bearing   Pertinent Vitals/Pain     Mobility  Bed Mobility Bed Mobility: Sit to Supine Supine to Sit: 6: Modified independent (Device/Increase time) Sitting - Scoot to Edge of Bed: 6: Modified independent (Device/Increase time) Sit to Supine: 6: Modified independent (Device/Increase time) Transfers Sit to Stand: 6: Modified independent (Device/Increase time) Stand Pivot Transfers: 6: Modified independent (Device/Increase time) Ambulation/Gait Ambulation/Gait Assistance: 4: Min guard Ambulation Distance (Feet): 120 Feet Assistive device: Small based quad cane Ambulation/Gait Assistance Details: Patient a little more unsteady this morning. Swayying with gait with use of cane but cued for technique and progressed Gait Pattern: Step-to pattern;Trunk flexed Gait velocity: decreased    Exercises     PT Diagnosis:    PT Problem List:   PT Treatment Interventions:     PT Goals Acute Rehab PT Goals PT Goal: Supine/Side to Sit - Progress: Met PT Goal: Sit to Stand - Progress: Met PT Goal: Ambulate - Progress: Progressing toward goal  Visit  Information  Last PT Received On: 08/21/12 Assistance Needed: +1    Subjective Data      Cognition  Overall Cognitive Status: Appears within functional limits for tasks assessed/performed Arousal/Alertness: Awake/alert Orientation Level: Appears intact for tasks assessed Behavior During Session: [facility] for tasks performed    Balance     End of Session PT - End of Session Equipment Utilized During Treatment: Gait belt Activity Tolerance: Patient tolerated treatment well Patient left: in bed;with call bell/phone within reach Nurse Communication: Mobility status   GP     Fredrich Birks 08/21/2012, 9:28 AM 08/21/2012 Fredrich Birks PTA [phone] pager [phone] office

## 2012-08-21 NOTE — Progress Notes (Signed)
Late Entry: after speaking with the patient's daughter and the patient, returning to [facility] is not a safe d/c for patient. Patient is agreeable to placement at SNF. CSW will begin placement process.   Sabino Niemann, MSW, [phone]

## 2012-08-21 NOTE — Clinical Documentation Improvement (Signed)
Abnormal Labs Clarification  THIS DOCUMENT IS NOT A PERMANENT PART OF THE MEDICAL RECORD  TO RESPOND TO THE THIS QUERY, FOLLOW THE INSTRUCTIONS BELOW:  1. If needed, update documentation for the patient's encounter via the notes activity.  2. Access this query again and click edit on the [employer].  3. After updating, or not, click F2 to complete all highlighted (required) fields concerning your review. Select "additional documentation in the medical record" OR "no additional documentation provided".  4. Click Sign note button.  5. The deficiency will fall out of your InBasket *Please let us know if you are not able to complete this workflow by phone or e-mail (listed below).  Please update your documentation within the medical record to reflect your response to this query.                                                                                   08/21/12  Dear Dr.  Howell Rucks Marton Redwood  In a better effort to capture your patient's severity of illness, reflect appropriate length of stay and utilization of resources, a review of the medical record has revealed the following indicators.    Based on your clinical judgment, please clarify and document in a progress note and/or discharge summary the clinical condition associated with the following supporting information:  In responding to this query please exercise your independent judgment.  The fact that a query is asked, does not imply that any particular answer is desired or expected.  Abnormal findings (laboratory, x-ray, pathologic, and other diagnostic results) are not coded and reported unless the physician indicates their clinical significance.   The medical record reflects the following clinical findings, please clarify the diagnostic and/or clinical significance:        Noting pre-op urine culture grew ">=100,000 COLONIES/ML Culture ESCHERICHIA COLI" patient's urine also "hazy" with "large amt of leukocytes"  please document related diagnosis if appropriate.  Thank you   Possible Clinical Conditions?                            pre op Ecoli UTI  Abnormal lab finding  Other Condition                Cannot Clinically Determine    Evaluation: urinalysis and urine culture  Only  1 dose of IV Vancomycin given preop.    Reviewed: additional documentation in the medical record   Thank You,  Lavonda Jumbo  Clinical Documentation Specialist, BSN: Pager [phone]  Health Information Management [place]

## 2012-08-21 NOTE — Progress Notes (Addendum)
Patient ID: Connie Wagner, female   DOB: 1928-10-28, 77 y.o.   MRN: [id number]     Subjective:  Patient reports pain as mild.  She states that she feels better everyday and has only mild pain in her elbow. We have had challenges regarding disposition planning, and the plan has changed multiple times, and we have not been able to establish adequate home support, and we'll therefore be working toward skilled nursing facility placement.  Objective:   VITALS:   Filed Vitals:   08/19/12 2113 08/20/12 0715 08/20/12 2245 08/21/12 0616  BP: 114/65 134/51 119/66 148/72  Pulse:  105 66 92  Temp:  100.2 F (37.9 C) 98.1 F (36.7 C) 98.3 F (36.8 C)  TempSrc:   Oral Oral  Resp: 16 16 16 18   SpO2: 97% 98% 94% 98%    ABD soft Sensation intact distally Dorsiflexion/Plantar flexion intact Incision: dressing C/D/I and no drainage  LABS  No results found for this or any previous visit (from the past 24 hour(s)).  Results for orders placed during the hospital encounter of 08/14/12  SURGICAL PCR SCREEN     Status: Normal   Collection Time   08/14/12 10:44 AM      Component Value Range Status Comment   MRSA, PCR NEGATIVE  NEGATIVE Final    Staphylococcus aureus NEGATIVE  NEGATIVE Final   URINE CULTURE     Status: Normal   Collection Time   08/14/12 10:45 AM      Component Value Range Status Comment   Specimen Description URINE, RANDOM   Final    Special Requests NONE   Final    Culture  Setup Time 08/14/2012 11:55   Final    Colony Count >=100,000 COLONIES/ML   Final    Culture ESCHERICHIA COLI   Final    Report Status 08/16/2012 FINAL   Final    Organism ID, Bacteria ESCHERICHIA COLI   Final     No results found.  Assessment/Plan: 4 Days Post-Op   Principal Problem:  *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 Active Problems:  Anxiety  Hypertension  Dementia  Depression  E. coli urinary tract infection,  asymptomatic. I will go ahead and give her cipro, and add this to her discharge regimen for a period of 5 days.  Advance diet Up with therapy Discharge to SNF when bed available    Haskel Khan 08/21/2012, 7:25 AM   Teryl Lucy, MD Cell [phone] Pager [phone]

## 2012-08-21 NOTE — Progress Notes (Signed)
Comments:  08/21/12 11:38 Vance Peper, RN BSN Case Manager Patient will be going to [facility] for shortterm rehab.

## 2012-10-18 ENCOUNTER — Encounter (HOSPITAL_COMMUNITY): Payer: Self-pay | Admitting: Internal Medicine

## 2012-10-18 ENCOUNTER — Encounter (HOSPITAL_COMMUNITY)
Admission: EM | Disposition: A | Discharge status: Nursing Home (Includes ICF) | Payer: Self-pay | Source: Other Acute Inpatient Hospital | Attending: Cardiovascular Disease

## 2012-10-18 ENCOUNTER — Ambulatory Visit (HOSPITAL_COMMUNITY): Admit: 2012-10-18 | Payer: Self-pay | Admitting: Cardiovascular Disease

## 2012-10-18 ENCOUNTER — Inpatient Hospital Stay (HOSPITAL_COMMUNITY)
Admission: EM | Admit: 2012-10-18 | Discharge: 2012-10-23 | DRG: 246 | Disposition: A | Discharge status: Nursing Home (Includes ICF) | Payer: Medicare Other | Source: Other Acute Inpatient Hospital | Attending: Cardiovascular Disease | Admitting: Cardiovascular Disease

## 2012-10-18 DIAGNOSIS — I2510 Atherosclerotic heart disease of native coronary artery without angina pectoris: Secondary | ICD-10-CM

## 2012-10-18 DIAGNOSIS — I498 Other specified cardiac arrhythmias: Secondary | ICD-10-CM | POA: Diagnosis present

## 2012-10-18 DIAGNOSIS — M545 Low back pain, unspecified: Secondary | ICD-10-CM | POA: Diagnosis present

## 2012-10-18 DIAGNOSIS — Z882 Allergy status to sulfonamides status: Secondary | ICD-10-CM

## 2012-10-18 DIAGNOSIS — D509 Iron deficiency anemia, unspecified: Secondary | ICD-10-CM | POA: Diagnosis present

## 2012-10-18 DIAGNOSIS — Z79899 Other long term (current) drug therapy: Secondary | ICD-10-CM

## 2012-10-18 DIAGNOSIS — Z955 Presence of coronary angioplasty implant and graft: Secondary | ICD-10-CM

## 2012-10-18 DIAGNOSIS — F0390 Unspecified dementia without behavioral disturbance: Secondary | ICD-10-CM | POA: Diagnosis present

## 2012-10-18 DIAGNOSIS — Z7982 Long term (current) use of aspirin: Secondary | ICD-10-CM

## 2012-10-18 DIAGNOSIS — I442 Atrioventricular block, complete: Secondary | ICD-10-CM | POA: Diagnosis not present

## 2012-10-18 DIAGNOSIS — F3289 Other specified depressive episodes: Secondary | ICD-10-CM | POA: Diagnosis present

## 2012-10-18 DIAGNOSIS — I059 Rheumatic mitral valve disease, unspecified: Secondary | ICD-10-CM

## 2012-10-18 DIAGNOSIS — G8929 Other chronic pain: Secondary | ICD-10-CM | POA: Diagnosis present

## 2012-10-18 DIAGNOSIS — D649 Anemia, unspecified: Secondary | ICD-10-CM | POA: Diagnosis present

## 2012-10-18 DIAGNOSIS — I2119 ST elevation (STEMI) myocardial infarction involving other coronary artery of inferior wall: Secondary | ICD-10-CM

## 2012-10-18 DIAGNOSIS — R5381 Other malaise: Secondary | ICD-10-CM | POA: Diagnosis present

## 2012-10-18 DIAGNOSIS — I5021 Acute systolic (congestive) heart failure: Secondary | ICD-10-CM | POA: Diagnosis present

## 2012-10-18 DIAGNOSIS — N390 Urinary tract infection, site not specified: Secondary | ICD-10-CM | POA: Diagnosis present

## 2012-10-18 DIAGNOSIS — R570 Cardiogenic shock: Secondary | ICD-10-CM | POA: Diagnosis present

## 2012-10-18 DIAGNOSIS — I441 Atrioventricular block, second degree: Secondary | ICD-10-CM | POA: Diagnosis not present

## 2012-10-18 DIAGNOSIS — F05 Delirium due to known physiological condition: Secondary | ICD-10-CM | POA: Diagnosis not present

## 2012-10-18 DIAGNOSIS — Z7902 Long term (current) use of antithrombotics/antiplatelets: Secondary | ICD-10-CM

## 2012-10-18 DIAGNOSIS — I509 Heart failure, unspecified: Secondary | ICD-10-CM | POA: Diagnosis present

## 2012-10-18 DIAGNOSIS — Z66 Do not resuscitate: Secondary | ICD-10-CM | POA: Diagnosis present

## 2012-10-18 DIAGNOSIS — F411 Generalized anxiety disorder: Secondary | ICD-10-CM | POA: Diagnosis present

## 2012-10-18 DIAGNOSIS — F329 Major depressive disorder, single episode, unspecified: Secondary | ICD-10-CM | POA: Diagnosis present

## 2012-10-18 DIAGNOSIS — Z96659 Presence of unspecified artificial knee joint: Secondary | ICD-10-CM

## 2012-10-18 DIAGNOSIS — I10 Essential (primary) hypertension: Secondary | ICD-10-CM | POA: Diagnosis present

## 2012-10-18 DIAGNOSIS — Z880 Allergy status to penicillin: Secondary | ICD-10-CM

## 2012-10-18 DIAGNOSIS — N179 Acute kidney failure, unspecified: Secondary | ICD-10-CM | POA: Diagnosis present

## 2012-10-18 DIAGNOSIS — R197 Diarrhea, unspecified: Secondary | ICD-10-CM | POA: Diagnosis present

## 2012-10-18 HISTORY — PX: LEFT HEART CATHETERIZATION WITH CORONARY ANGIOGRAM: SHX5451

## 2012-10-18 LAB — URINALYSIS, ROUTINE W REFLEX MICROSCOPIC
Nitrite: NEGATIVE
Specific Gravity, Urine: 1.026 (ref 1.005–1.030)
Urobilinogen, UA: 1 mg/dL (ref 0.0–1.0)
pH: 6 (ref 5.0–8.0)

## 2012-10-18 LAB — COMPREHENSIVE METABOLIC PANEL
ALT: 83 U/L — ABNORMAL HIGH (ref 0–35)
AST: 195 U/L — ABNORMAL HIGH (ref 0–37)
Calcium: 8.1 mg/dL — ABNORMAL LOW (ref 8.4–10.5)
Sodium: 133 mEq/L — ABNORMAL LOW (ref 135–145)
Total Protein: 7.1 g/dL (ref 6.0–8.3)

## 2012-10-18 LAB — POCT I-STAT, CHEM 8
Creatinine, Ser: 3.3 mg/dL — ABNORMAL HIGH (ref 0.50–1.10)
Glucose, Bld: 203 mg/dL — ABNORMAL HIGH (ref 70–99)
Hemoglobin: 11.2 g/dL — ABNORMAL LOW (ref 12.0–15.0)
Sodium: 133 mEq/L — ABNORMAL LOW (ref 135–145)
TCO2: 22 mmol/L (ref 0–100)

## 2012-10-18 LAB — CK TOTAL AND CKMB (NOT AT ARMC)
CK, MB: 25.1 ng/mL (ref 0.3–4.0)
Relative Index: 3.7 — ABNORMAL HIGH (ref 0.0–2.5)

## 2012-10-18 LAB — LIPID PANEL
Cholesterol: 140 mg/dL (ref 0–200)
Total CHOL/HDL Ratio: 2.4 RATIO
Triglycerides: 103 mg/dL (ref <150)
VLDL: 21 mg/dL (ref 0–40)

## 2012-10-18 LAB — TROPONIN I
Troponin I: >20 ng/mL (ref <0.30)
Troponin I: >20 ng/mL (ref <0.30)

## 2012-10-18 LAB — CBC
MCH: 27 pg (ref 26.0–34.0)
MCHC: 32.1 g/dL (ref 30.0–36.0)
Platelets: 181 10*3/uL (ref 150–400)
RDW: 18.5 % — ABNORMAL HIGH (ref 11.5–15.5)

## 2012-10-18 LAB — URINE MICROSCOPIC-ADD ON

## 2012-10-18 LAB — MRSA PCR SCREENING: MRSA by PCR: NEGATIVE

## 2012-10-18 LAB — APTT: aPTT: 29 seconds (ref 24–37)

## 2012-10-18 SURGERY — LEFT HEART CATHETERIZATION WITH CORONARY ANGIOGRAM
Anesthesia: LOCAL

## 2012-10-18 MED ORDER — BIVALIRUDIN 250 MG IV SOLR
INTRAVENOUS | Status: AC
  Filled 2012-10-18: qty 250

## 2012-10-18 MED ORDER — CEFTRIAXONE SODIUM 1 G IJ SOLR
1.0000 g | INTRAMUSCULAR | Status: AC
  Administered 2012-10-18 – 2012-10-20 (×3): 1 g via INTRAVENOUS
  Filled 2012-10-18 (×4): qty 10

## 2012-10-18 MED ORDER — CLOPIDOGREL BISULFATE 75 MG PO TABS
75.0000 mg | ORAL_TABLET | Freq: Every day | ORAL | Status: DC
  Administered 2012-10-19 – 2012-10-23 (×5): 75 mg via ORAL
  Filled 2012-10-18 (×6): qty 1

## 2012-10-18 MED ORDER — ZOLPIDEM TARTRATE 5 MG PO TABS
5.0000 mg | ORAL_TABLET | Freq: Every day | ORAL | Status: DC
  Administered 2012-10-18 – 2012-10-22 (×5): 5 mg via ORAL
  Filled 2012-10-18 (×5): qty 1

## 2012-10-18 MED ORDER — HYDROCODONE-ACETAMINOPHEN 10-325 MG PO TABS
1.0000 | ORAL_TABLET | Freq: Four times a day (QID) | ORAL | Status: DC | PRN
  Administered 2012-10-18: 1 via ORAL
  Administered 2012-10-19: 2 via ORAL
  Administered 2012-10-21 – 2012-10-23 (×4): 1 via ORAL
  Filled 2012-10-18 (×7): qty 1

## 2012-10-18 MED ORDER — FENTANYL CITRATE 0.05 MG/ML IJ SOLN
INTRAMUSCULAR | Status: AC
  Filled 2012-10-18: qty 2

## 2012-10-18 MED ORDER — DOPAMINE-DEXTROSE 3.2-5 MG/ML-% IV SOLN
2.0000 ug/kg/min | INTRAVENOUS | Status: DC
  Administered 2012-10-20: 7 ug/kg/min via INTRAVENOUS
  Administered 2012-10-20: 5 ug/kg/min via INTRAVENOUS
  Filled 2012-10-18 (×2): qty 250

## 2012-10-18 MED ORDER — ACETAMINOPHEN 325 MG PO TABS
650.0000 mg | ORAL_TABLET | ORAL | Status: DC | PRN
  Administered 2012-10-19: 650 mg via ORAL
  Filled 2012-10-18: qty 2

## 2012-10-18 MED ORDER — DOPAMINE-DEXTROSE 3.2-5 MG/ML-% IV SOLN
INTRAVENOUS | Status: AC
  Administered 2012-10-18: 5 ug/kg/min via INTRAVENOUS
  Filled 2012-10-18: qty 250

## 2012-10-18 MED ORDER — SODIUM CHLORIDE 0.9 % IV SOLN
INTRAVENOUS | Status: DC
  Administered 2012-10-20: 08:00:00 via INTRAVENOUS

## 2012-10-18 MED ORDER — ASPIRIN 81 MG PO CHEW
81.0000 mg | CHEWABLE_TABLET | Freq: Every day | ORAL | Status: DC
  Administered 2012-10-19 – 2012-10-23 (×5): 81 mg via ORAL
  Filled 2012-10-18 (×5): qty 1

## 2012-10-18 MED ORDER — CLOPIDOGREL BISULFATE 300 MG PO TABS
ORAL_TABLET | ORAL | Status: AC
  Filled 2012-10-18: qty 2

## 2012-10-18 MED ORDER — HEPARIN (PORCINE) IN NACL 2-0.9 UNIT/ML-% IJ SOLN
INTRAMUSCULAR | Status: AC
  Filled 2012-10-18: qty 2000

## 2012-10-18 MED ORDER — LIDOCAINE HCL (PF) 1 % IJ SOLN
INTRAMUSCULAR | Status: AC
  Filled 2012-10-18: qty 30

## 2012-10-18 MED ORDER — MIDAZOLAM HCL 2 MG/2ML IJ SOLN
INTRAMUSCULAR | Status: AC
  Filled 2012-10-18: qty 2

## 2012-10-18 MED ORDER — ATORVASTATIN CALCIUM 80 MG PO TABS
80.0000 mg | ORAL_TABLET | Freq: Every day | ORAL | Status: DC
  Administered 2012-10-18 – 2012-10-22 (×5): 80 mg via ORAL
  Filled 2012-10-18 (×6): qty 1

## 2012-10-18 MED ORDER — ASPIRIN 81 MG PO CHEW
CHEWABLE_TABLET | ORAL | Status: AC
  Filled 2012-10-18: qty 4

## 2012-10-18 MED ORDER — SODIUM CHLORIDE 0.9 % IV SOLN: INTRAVENOUS | Status: AC

## 2012-10-18 MED ORDER — ONDANSETRON HCL 4 MG/2ML IJ SOLN
4.0000 mg | Freq: Four times a day (QID) | INTRAMUSCULAR | Status: DC | PRN
  Administered 2012-10-18: 4 mg via INTRAVENOUS
  Filled 2012-10-18: qty 2

## 2012-10-18 NOTE — Progress Notes (Signed)
Pt. came in as Code stemi and went strait to cath lab.  Assisted  Cath lab staff in contacting pt. Daughter. Daughter in route to hospital.  Will follow as needed.   10/18/12 1100  Clinical Encounter Type  Visited With Patient;Health care provider  Visit Type Spiritual support  Referral From Nurse  Spiritual Encounters  Spiritual Needs Emotional

## 2012-10-18 NOTE — CV Procedure (Signed)
Cardiac Catheterization Procedure Note  Name: DESHANTA LADY MRN: [id number] DOB: 1929/05/01  Procedure: Left Heart Cath, Selective Coronary Angiography, LV angiography,  PTCA/Stent of the RCA, insertion of temporary pacemaker under fluoroscopic guidance, closure of the RFA with a Mynx device.  Indication: 79 year-old woman with an inferior wall ST elevation MI. A code STEMI was called by EMS. She was brought directly to the cardiac cath lab. We discussed her wishes. She was very clear with me in expressing that she did wish to undergo catheterization and possible PCI as standard of care treatment for her acute myocardial infarction. However, she also clearly stated that she did not want "heroic measures" such as CPR, intubation or mechanical ventilation, or defibrillation. After this discussion we elected to proceed with cardiac catheterization and possible PCI. The patient was in complete heart block on arrival to the cardiac cath lab.   Diagnostic Procedure Details: The right groin was prepped, draped, and anesthetized with 1% lidocaine. Using the modified Seldinger technique, a 6 French sheath was introduced into the right femoral artery. Standard Judkins catheters were used for selective coronary angiography and left ventriculography. Catheter exchanges were performed over a wire.  The diagnostic procedure was well-tolerated without immediate complications.  PROCEDURAL FINDINGS Hemodynamics: AO 130/64 LV 132/28  Coronary angiography: Coronary dominance: right  Left mainstem: Moderate calcification, there is diffuse distal 30% left main stenosis.  Left anterior descending (LAD): The LAD is a large caliber vessel. There is heavy proximal calcification. Just after the first septal perforator there is 50% calcified stenosis. There are no high-grade lesions throughout the course of the LAD. The diagonal branches are patent with mild 20-30% stenosis.  Left circumflex (LCx): The obtuse  marginal branches of the circumflex are patent. The AV groove circumflex is small. There are 2 moderate caliber OM's without significant disease.  Right coronary artery (RCA): The RCA is dominant. The proximal vessel is patent. The mid vessel has heavily calcified severe stenosis with a 99% lesion. There is TIMI 2 flow into the distal branch vessels of the RCA. There is some competitive left to right collateral flow. The distal RCA is diffusely diseased without high-grade stenosis.  Left ventriculography: Deferred because of elevated creatinine  PCI Procedure Note:  Following the diagnostic procedure, the decision was made to proceed with PCI. The patient was loaded with Plavix 600 mg on the table. Weight-based bivalirudin was given for anticoagulation. Once a therapeutic ACT was achieved, a 6 [place] JR 4 guide catheter was inserted.  A cougar coronary guidewire was used to cross the lesion.  The lesion was predilated with a 2.0 x 15 mm balloon.  I attempted to pass a 2.5 x 24 mm promus premier drug-eluting stent. However I was unable to pass the stent through the calcified mid right coronary artery. The vessel was redilated with a 2.5 mm balloon and several bloody wires were attempted. Finally a pro-water wire was successfully advanced and a stent was passed over this wire. The lesion was then stented with a 2.5 x 24 mm promus premier drug-eluting stent.  The stent was postdilated with a 2.75 mm noncompliant balloon.  Following PCI, there was 0% residual stenosis and TIMI-3 flow. Final angiography confirmed an excellent result.   Prior to stenting, the patient's hemodynamics worsened. Her heart rate remained about 30 beats per minute in complete heart block. I placed a sheath in the right femoral vein and advanced a balloon tipped pacing wire under fluoroscopic guidance. The patient was ventricularly paced  at 60 beats per minute for the remainder of the procedure. She was also started on low-dose dopamine.     Femoral hemostasis was achieved with a Perclose device.  The patient tolerated the PCI procedure well. There were no immediate procedural complications.  The patient was transferred to the post catheterization recovery area for further monitoring.  PCI Data: Vessel - RCA/Segment - mid Percent Stenosis (pre)  99 TIMI-flow 2 Stent 2.5 x 24 mm drug-eluting Percent Stenosis (post) 0 TIMI-flow (post) 3  Final Conclusions:   1. Acute inferior wall myocardial infarction complicated by complete AV block 2. Severe RCA stenosis treated successfully with primary PCI 3. Moderate LAD stenosis with heavy calcification 4. Widely patent left circumflex 5. Complete AV block treated with a temporary pacing wire  Recommendations: The patient will be monitored closely in the CCU. I discussed the seriousness of her illness with her daughter and son-in-law. Hopefully her intrinsic rhythm will recover within 24-48 hours. If she remains in complete AV block, will ask the EP service to see her tomorrow. Hopefully her renal function will recover with improved hemodynamics and IV fluids. Her prognosis is guarded.  Tonny Bollman 10/18/2012, 12:09 PM

## 2012-10-18 NOTE — H&P (Signed)
Patient ID: Connie Wagner MRN: [id number], DOB/AGE: June 02, 1929   Admit date: 10/18/2012   Primary Physician: Ginette Otto, MD Primary Cardiologist: None  Pt. Profile: Patient is an 79 yo F with history of anemia & HTN called EMS on the day of admission because of nonbloody, copious diarrhea for 2 days prior to admission. On arrival, EMS performed an EKG that revealed inferior wave ST elevations.     On our interview, patient is a very pleasant woman who reports 2 days of diarrhea accompanied by weakness & thirst but no abdominal pain.  She denied chest pain, shortness of breath, dizziness and diaphoresis.  She reports stiff right shoulder pain for 2 days prior to admission, noting that pain would not let up.  She has no other complaints, but does clarify that should anything happen to cause her to deteriorate, she under no circumstance wants to be ventilated or undergot CPR/heroic measures.  On arrival she was taken directly to the cath lab.  Of note, she has no history of DM, and denies tobacco abuse.  Problem List  Past Medical History  Diagnosis Date  . Blood transfusion   . Anemia   . Upper GI bleeding 09/14/11    "have had it once before today"  . Migraines     "haven't had one since going thru menopause"  . Depression   . Anxiety   . Rheumatoid arthritis   . Chronic lower back pain   . Hx: UTI (urinary tract infection)     "every now and then"  . Hypertension     pcp dr Pete Glatter  . Dementia     needs redirection freq.  . 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 09/14/2011    Past Surgical History  Procedure Laterality Date  . Back surgery    . Joint replacement    . Lumbar disc surgery  09/2002    Hemilaminectomy, L3, complete laminectomy, L4, hemi- semilaminectomy, L5 and right S1, and diskectomy, L5-S1.  Marland Kitchen Total knee arthroplasty  01/2002    right  . Incision and drainage of wound   10/2002    lumbar wound infection  . Knee arthroscopy  01/2001    right  . Hematoma evacuation  10/2002    lumbar  . Lumbar laminectomy  10/2003    for epidural abscess evacuation; L4-5  . Breast lumpectomy      "benign tumor removed"  . Esophagogastroduodenoscopy  09/15/2011    Procedure: ESOPHAGOGASTRODUODENOSCOPY (EGD);  Surgeon: Barrie Folk, MD;  Location[facility] ENDOSCOPY;  Service: Endoscopy;  Laterality: N/A;  . Orif humerus fracture  08/17/2012    Procedure: OPEN REDUCTION INTERNAL FIXATION (ORIF) DISTAL HUMERUS FRACTURE;  Surgeon: Eulas Post, MD;  Location: [facility] OR;  Service: Orthopedics;  Laterality: Right;  ORIF RIGHT SUPRACHONDULAR HUMERUS FRACTURE  . Harvest bone graft  08/17/2012    Procedure: HARVEST ILIAC BONE GRAFT;  Surgeon: Eulas Post, MD;  Location: [facility] OR;  Service: Orthopedics;  Laterality: Right;     Allergies Allergies  Allergen Reactions  . Penicillins Rash  . Sulfa Drugs Cross Reactors Rash    Home Medications Prior to Admission medications   Medication Sig Start Date End Date Taking? Authorizing Provider  amLODipine (NORVASC) 10 MG tablet Take 10 mg by mouth daily.    Historical Provider, MD  ciprofloxacin (CIPRO) 500 MG tablet Take 1 tablet (500 mg total) by mouth 2 (two) times daily. 08/21/12  Eulas Post, MD  ferrous sulfate 325 (65 FE) MG tablet Take 325 mg by mouth daily with breakfast.    Historical Provider, MD  hydrochlorothiazide (MICROZIDE) 12.5 MG capsule Take 12.5 mg by mouth daily.      Historical Provider, MD  HYDROcodone-acetaminophen (NORCO) 10-325 MG per tablet Take 1-2 tablets by mouth every 6 (six) hours as needed for pain. 08/17/12   Eulas Post, MD  ondansetron (ZOFRAN) 4 MG tablet Take 1 tablet (4 mg total) by mouth every 8 (eight) hours as needed for nausea. 08/17/12   Eulas Post, MD  sennosides-docusate sodium (SENOKOT-S) 8.6-50 MG tablet Take 1 tablet by mouth daily. 08/17/12   Eulas Post, MD  zolpidem (AMBIEN) 5  MG tablet Take 5 mg by mouth at bedtime.    Historical Provider, MD    Family History Family History  Problem Relation Age of Onset  . Anesthesia problems Neg Hx   . Hypotension Neg Hx   . Malignant hyperthermia Neg Hx   . Pseudochol deficiency Neg Hx     Social History History   Social History  . Marital Status: Widowed    Spouse Name: N/A    Number of Children: N/A  . Years of Education: N/A   Occupational History  . 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 Smoking status: Never Smoker   . Smokeless tobacco: Never Used  . Alcohol Use: 3.0 oz/week    5 Glasses of wine per week  . Drug Use: No  . Sexually Active: No   Other Topics Concern  . Not on file   Social History Narrative   Daughter and Emergency Contact: Desmond Dike   Cell [phone]   Home [phone]     Review of Systems General:  No chills, fever, night sweats or weight changes.  Cardiovascular:  No chest pain, dyspnea on exertion, edema, orthopnea, palpitations, paroxysmal nocturnal dyspnea. Dermatological: No rash, lesions/masses Respiratory: No cough, dyspnea Urologic: No hematuria, dysuria Abdominal:   No nausea, vomiting, bright red blood per rectum, melena, or hematemesis Neurologic:  No visual changes, wkns, changes in mental status. All other systems reviewed and are otherwise negative except as noted above.  Physical Exam Vitals by EMS: 120/86, HR 62, 100% RA  General: Pleasant, NAD, jovial Psych: Normal affect. Neuro: Alert and oriented X 3. Moves all extremities spontaneously. HEENT: Normal  Neck: Supple without bruits or JVD. Lungs:  Resp regular and unlabored, CTA. Heart: RRR no s3, s4, or murmurs. Abdomen: Soft, non-tender, non-distended, BS + x 4.  Extremities: No clubbing, cyanosis or edema. DP/PT/Radials 2+ and equal bilaterally.  Right arm in brace with good ROM  Labs  iStat labs in Cath lab: Cr 3.3, K 5, Hb 11.2 Hct 33   10/18/2012 10:50  WBC  8.9  RBC 3.78 (L)  Hemoglobin 10.2 (L)  HCT 31.8 (L)  MCV 84.1  MCH 27.0  MCHC 32.1  RDW 18.5 (H)  Platelets 181  Prothrombin Time 15.0  INR 1.20    ECG - ST elevation in II, III, aVF  Other Cardiac Studies:  Echo on 01/24/11:  Normal LV cavity size. Mild physical basal hypertrophy of the septum. Normal systolic function. Estimated EF 55-60%. Normal LV wall motion. There was an increase relative contribution of atrial contraction to ventricular filling. Doppler parameters are consistent with abnormal left ventricular relaxation (grade 1 diastolic dysfunction). Aortic valve had moderate diffuse thickening and calcification  consistent with sclerosis. There was increased thickness of the atrial septum, consistent with lipomatous hypertrophy. Pulmonary artery peak pressure 39 mmHg (S). Trivial mitral regurgitation.   ASSESSMENT AND PLAN  #STEMI (Inferior wall): Patient with atypical presentation for STEMI. Cardiac pain likely referred to posterior right shoulder. She does not have history of CAD, DM, or tobacco abuse.  -Directly to cath lab, then admit to CCU -Start ASA, Plavix, Statin post procedure -BB held in setting of bradycardia -Follow lipid profile & A1c -Post procedure and AM EKG -NTG & morphine prn chest pain  #Complete Heart Block: Secondary to inferior wall STEMI; Temporary pacer to be placed during cath  #Acute Renal Failure: Most likely prerenal in setting of perfuse diarrhea & bradycardia.  Patient with history of UTIs, so cannot rule out UTI.  Post renal etiology less likely, as patient has not experienced difficulty with voiding.   -check UA/micro and hydrate; follow renal function -Hold diuretics and nephrotoxic meds -Monitor urine output  ADDENDUM: UA/micro suggestive of UTI. Culture pending, but past cultures have shown sensitivity to ceftriaxone. Will plan to treat with Ceftriaxone 1g daily x 3 days (allergy to PCN (rash) but low cross reactivity)  #Acute  Non-bloody Diarrhea: Await CBC to evaluate for leukocytosis. Monitor and check C.diff. Hydrate.  #HTN: home regimen includes amlodipine 10 and HCTZ 12.5mg  daily -Hold in setting of acute renal failure   #Normocytic Anemia: Hb improved from 9.5 two months prior. Hb 10.2 at admission.  #Dispo: admit to CCU  #Code status: DNR   Signed, Stacy Gardner, MD, PGY 2 10/18/2012, 11:05 AM Pager [phone]  Patient seen, examined. Available data reviewed. Agree with findings, assessment, and plan as outlined by Dr Everardo Beals. The patient is critically ill with an acute inferior wall MI complicated by shock and complete heart block. She has undergone emergency cath as outlined with primary PCI of the RCA. A temporary pacing wire was placed to treat complete AV block. Will watch rhythm closely and reassess tomorrow (hopefully will regain AV conduction after reperfusion). Patient's daughter and son-in-law updated at length. Pt was agreeable to undergo cath/PCI, but does not want CPR/shocks/intubation. Will make DNR per her wishes.  Tonny Bollman, M.D. 10/18/2012 11:03 PM

## 2012-10-18 NOTE — Care Management Note (Signed)
    Page 1 of 1   10/18/2012     1:49:11 PM   CARE MANAGEMENT NOTE 10/18/2012  Patient:  Connie Wagner, Connie Wagner   Account Number:  [id number]  Date Initiated:  10/18/2012  Documentation initiated by:  Junius Creamer  Subjective/Objective Assessment:   adm w mi     Action/Plan:   lives alone, supp da, pcp dr hal stoneking   Anticipated DC Date:     Anticipated DC Plan:        DC Planning Services  CM consult      Choice offered to / List presented to:             Status of service:   Medicare Important Message given?   (If response is "NO", the following Medicare IM given date fields will be blank) Date Medicare IM given:   Date Additional Medicare IM given:    Discharge Disposition:    Per UR Regulation:  Reviewed for med. necessity/level of care/duration of stay  If discussed at Long Length of Stay Meetings, dates discussed:    Comments:  2/20 1348 debbie Verna Desrocher rn,bsn

## 2012-10-18 NOTE — Progress Notes (Signed)
  Echocardiogram 2D Echocardiogram has been performed.  Connie Wagner 10/18/2012, 2:49 PM

## 2012-10-18 NOTE — Progress Notes (Signed)
Pt has a hx of memory impairment. Pt is having difficulty keeping right leg straight. MD notified, order placed for knee immobilizer. Will continue to monitor.

## 2012-10-19 ENCOUNTER — Encounter (HOSPITAL_COMMUNITY): Payer: Self-pay | Admitting: *Deleted

## 2012-10-19 DIAGNOSIS — I442 Atrioventricular block, complete: Secondary | ICD-10-CM | POA: Diagnosis present

## 2012-10-19 DIAGNOSIS — N390 Urinary tract infection, site not specified: Secondary | ICD-10-CM | POA: Diagnosis present

## 2012-10-19 DIAGNOSIS — N179 Acute kidney failure, unspecified: Secondary | ICD-10-CM | POA: Diagnosis present

## 2012-10-19 DIAGNOSIS — I5021 Acute systolic (congestive) heart failure: Secondary | ICD-10-CM | POA: Diagnosis present

## 2012-10-19 DIAGNOSIS — D649 Anemia, unspecified: Secondary | ICD-10-CM | POA: Diagnosis present

## 2012-10-19 DIAGNOSIS — I2510 Atherosclerotic heart disease of native coronary artery without angina pectoris: Secondary | ICD-10-CM | POA: Diagnosis present

## 2012-10-19 DIAGNOSIS — Z955 Presence of coronary angioplasty implant and graft: Secondary | ICD-10-CM

## 2012-10-19 LAB — TROPONIN I: Troponin I: >20 ng/mL (ref <0.30)

## 2012-10-19 LAB — CBC
MCH: 27.5 pg (ref 26.0–34.0)
MCV: 84 fL (ref 78.0–100.0)
Platelets: 177 10*3/uL (ref 150–400)
RDW: 18.4 % — ABNORMAL HIGH (ref 11.5–15.5)

## 2012-10-19 LAB — BASIC METABOLIC PANEL
Calcium: 8.2 mg/dL — ABNORMAL LOW (ref 8.4–10.5)
Creatinine, Ser: 1.68 mg/dL — ABNORMAL HIGH (ref 0.50–1.10)
GFR calc Af Amer: 32 mL/min — ABNORMAL LOW (ref >90)

## 2012-10-19 LAB — TSH: TSH: 0.522 u[IU]/mL (ref 0.350–4.500)

## 2012-10-19 MED ORDER — LORAZEPAM 2 MG/ML IJ SOLN
0.5000 mg | Freq: Once | INTRAMUSCULAR | Status: AC
  Administered 2012-10-19: 0.25 mg via INTRAVENOUS
  Filled 2012-10-19: qty 1

## 2012-10-19 MED ORDER — LORAZEPAM 2 MG/ML IJ SOLN
INTRAMUSCULAR | Status: AC
  Administered 2012-10-19: 0.25 mg
  Filled 2012-10-19: qty 1

## 2012-10-19 MED ORDER — SODIUM CHLORIDE 0.9 % IV SOLN
INTRAVENOUS | Status: AC
  Administered 2012-10-19: 11:00:00 via INTRAVENOUS

## 2012-10-19 MED FILL — Dextrose Inj 5%: INTRAVENOUS | Qty: 50 | Status: AC

## 2012-10-19 NOTE — Progress Notes (Signed)
Orthopedic Tech Progress Note Patient Details:  Connie Wagner 31-Oct-1928 [id number]  Ortho Devices Type of Ortho Device: Knee Immobilizer Ortho Device/Splint Location: right leg Ortho Device/Splint Interventions: Application   Roshell Brigham 10/19/2012, 1:46 PM

## 2012-10-19 NOTE — Progress Notes (Signed)
Clinical Social Work Department BRIEF PSYCHOSOCIAL ASSESSMENT 10/19/2012  Patient:  Connie Wagner, Connie Wagner     Account Number:  [id number]     Admit date:  10/18/2012  Clinical Social Worker:  Dennison Bulla  Date/Time:  10/19/2012 11:30 AM  Referred by:  Physician  Date Referred:  10/19/2012 Referred for  SNF Placement   Other Referral:   Interview type:  Patient Other interview type:    PSYCHOSOCIAL DATA Living Status:  FACILITY Admitted from facility:  [facility] Level of care:  Independent Living Primary support name:  Connie Wagner Primary support relationship to patient:  CHILD, ADULT Degree of support available:   Strong    CURRENT CONCERNS Current Concerns  Post-Acute Placement   Other Concerns:    SOCIAL WORK ASSESSMENT / PLAN CSW received referral due to patient being admitted from a facility. CSW reviewed chart and met with patient and dtr at bedside. Patient agreeable to dtr involvement during assessment.    CSW introduced myself and explained role. Patient reports she feels well and is not having any pain. Patient reports that she was at home when she had pain in her shoulder and when she came to the hospital she found out she had a heart attack. Patient reports that she has been living at [facility] independent living for about 5-6 years. Patient reports she is independent and wants to return to facility at dc. CSW spoke with patient regarding ALF or SNF but patient and dtr agreeable to independent living.    CSW is signing off but if patient desires placement at dc, CSW provided patient with CSW contact information and will assist with dc planning.   Assessment/plan status:  No Further Intervention Required Other assessment/ plan:   Information/referral to community resources:   Patient will return to independent living.  CSW offered information on SNF or ALF    PATIENT'S/FAMILY'S RESPONSE TO PLAN OF CARE: Patient alert and oriented. Patient laying in bed and  pleasant. Patient agreeable to assessment and appreciative of visit. Patient feels confident she can return to ind. living and has no safety concerns. Dtr agreeable to plan.

## 2012-10-19 NOTE — Plan of Care (Signed)
Problem: Phase I Progression Outcomes Goal: Hemodynamically stable Outcome: Progressing Dopamine gtt infusing and titrated to keep SBP > 90 and MAP > 65.

## 2012-10-19 NOTE — Progress Notes (Signed)
Patient Name: Connie Wagner Date of Encounter: 10/19/2012   Principal Problem:   Acute MI, inferior wall, initial episode of care Active Problems:   HTN (hypertension)   Dementia   Complete heart block   Acute renal failure   Acute lower UTI   Acute systolic CHF (congestive heart failure)   Normocytic anemia   S/P drug eluting coronary stent placement   Coronary artery disease    SUBJECTIVE Dopamine titrated up (currently at , peak at overnight) and pacer sensitivity increased; HR continues to be paced at 60. Patient without complaints - no more diarrhea, pain behind right shoulder has resolved.  On questioning, she does report dysuria and urinary urgency prior to admission as well.  CURRENT MEDS . aspirin  81 mg Oral Daily  . atorvastatin  80 mg Oral q1800  . cefTRIAXone (ROCEPHIN)  IV  1 g Intravenous Q24H  . clopidogrel  75 mg Oral Q breakfast  . zolpidem  5 mg Oral QHS    OBJECTIVE  Filed Vitals:   10/19/12 0300 10/19/12 0400 10/19/12 0500 10/19/12 0600  BP: 105/39 117/60 105/44 109/47  Pulse:      Temp:      TempSrc:      Resp: 17 24 15 19   Height:      Weight:      SpO2: 99% 99% 97% 99%    Intake/Output Summary (Last 24 hours) at 10/19/12 0726 Last data filed at 10/19/12 0600  Gross per 24 hour  Intake 1833.31 ml  Output    480 ml  Net 1353.31 ml   Filed Weights   10/18/12 1238  Weight: 132 lb 11.5 oz (60.2 kg)    PHYSICAL EXAM General: Pleasant, NAD. Neuro: Alert and oriented X 3 (birthyear, name, place, month - not oriented to current year). Moves all extremities spontaneously. Psych: Normal affect. HEENT:  Normal  Neck: Supple without bruits or JVD. Lungs:  Resp regular and unlabored, CTA. Heart: RRR no s3, s4; soft systolic murmur at right 2nd intercostal space Abdomen: Soft, non-tender, non-distended, BS + x 4.  Extremities: No clubbing, cyanosis or edema. DP/PT/Radials 2+ and equal bilaterally. Right arm in brace. PM wire from  right groin  Accessory Clinical Findings  CBC  Recent Labs  10/18/12 1050 10/18/12 1054 10/19/12 0104  WBC 8.9  --  10.0  HGB 10.2* 11.2* 10.5*  HCT 31.8* 33.0* 32.1*  MCV 84.1  --  84.0  PLT 181  --  177   Basic Metabolic Panel  Recent Labs  10/18/12 1050 10/18/12 1054 10/19/12 0104  NA 133* 133* 135  K 5.1 5.0 4.4  CL 94* 100 99  CO2 21  --  22  GLUCOSE 204* 203* 122*  BUN 48* 44* 43*  CREATININE 3.03* 3.30* 1.68*  CALCIUM 8.1*  --  8.2*   Liver Function Tests  Recent Labs  10/18/12 1050  AST 195*  ALT 83*  ALKPHOS 138*  BILITOT 0.3  PROT 7.1  ALBUMIN 3.2*   Cardiac Enzymes  Recent Labs  10/18/12 1050 10/18/12 1351 10/18/12 1933 10/19/12 0104  CKTOTAL 681*  --   --   --   CKMB 25.1*  --   --   --   TROPONINI  --  >20.00* >20.00* >20.00*   Hemoglobin A1C  Recent Labs  10/18/12 1050  HGBA1C 6.2*   Fasting Lipid Panel  Recent Labs  10/18/12 1050  CHOL 140  HDL 59  LDLCALC 60  TRIG 103  CHOLHDL 2.4   Thyroid Function Tests  Recent Labs  10/18/12 1933  TSH 0.522    10/18/2012 17:35  Color, Urine YELLOW  APPearance CLEAR  Specific Gravity, Urine 1.026  pH 6.0  Glucose NEGATIVE  Bilirubin Urine NEGATIVE  Ketones, ur NEGATIVE  Protein NEGATIVE  Urobilinogen, UA 1.0  Nitrite NEGATIVE  Leukocytes, UA LARGE (A)  Hgb urine dipstick LARGE (A)  WBC, UA 21-50  RBC / HPF 21-50  Squamous Epithelial / LPF MANY (A)  Bacteria, UA MANY (A)   TELE - paced @ 60  ECG - paced @ 60, QRS 164, QTc 490  Cardiac Studies:  Cardiac Cath (10/18/12) 1. Acute inferior wall myocardial infarction complicated by complete AV block  2. Severe RCA stenosis treated successfully with primary PCI  3. Moderate LAD stenosis with heavy calcification  4. Widely patent left circumflex  5. Complete AV block treated with a temporary pacing wire  2D Echo (10/18/12) - Left ventricle: The cavity size was normal. Wall thickness was increased in a pattern of  mild LVH. The basal to mid inferior and inferoseptal walls were akinetic. The posterior wall was hypokinetic. The estimated ejection fraction was 45%. Doppler parameters are consistent with abnormal left ventricular relaxation (grade 1 diastolic dysfunction). - Aortic valve: Trileaflet; moderately calcified leaflets. Sclerosis without stenosis. - Mitral valve: Mild regurgitation. - Left atrium: The atrium was mildly dilated. - Right ventricle: The cavity size was normal. Pacer wire or catheter noted in right ventricle. Systolic function was moderately reduced. - Tricuspid valve: Peak RV-RA gradient: 17mm Hg (S). - Pulmonary arteries: PA peak pressure: 32mm Hg (S). - Systemic veins: The IVC measured 2.3 cm with minimal respirophasic variation, suggesting RA pressure 15 mmHg.   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  #STEMI (Inferior wall): Day 1 s/p PCI with DES to RCA. No Diabetes (A1c 6.2). Right neck pain resolved. -Continue ASA, Plavix, Statin   -BB held in setting of bradycardia  -NTG & morphine prn chest pain   #Complete Heart Block/Cardiogenic shock: Secondary to inferior wall STEMI; Temporary pacer sensitivity & Dopamine increased overnight. -EP Consult   #Acute Renal Failure: Improving - Most likely prerenal in setting of perfuse diarrhea & shock, and UA revealed UTI. In/Outs suggestion patient is net positive in setting of fluid depletion. -Hold diuretics and nephrotoxic meds  -Monitor urine output & creatinine -Hydrate with NS at 100cc/h x 10h  #UTI: culture pending, past culture has been sensitive to ceftriaxone -Ceftriaxone 1g daily (Day 2/3) - no evidence of rash after first dose  #Acute systolic CHF: EF 56% and RV function reduced on echo. Secondary to inferior wall STEMI.  -No ACEI in setting of acute renal failure -No BB in setting of complete heart  block -Closely monitor fluid & respiratory status  #Acute Non-bloody Diarrhea: Seems to have resolved. May be related to UTI or vagal response from MI. -Cancel C.diff and monitor  #HTN: home regimen includes amlodipine 10 and HCTZ 12.5mg  daily  -Hold in setting of cardiogenic shock & acute renal failure  #Normocytic Anemia: Hb stable at 10.5.  #Dispo: pending clinical improvement, continues to require dopamine  #Code status: DNR  Signed, Stacy Gardner MD, PGY2  History and all data above reviewed.  Patient examined.  I agree with the findings as above.  The patient exam reveals   She denies any chest pain.   Heart  rate still low.  COR:RRR  ,  Lungs: Clear  ,  Abd: Positive bowel sounds, no rebound no guarding, Ext No edema  .  All available labs, radiology testing, previous records reviewed. Agree with documented assessment and plan. Plan to keep pacer in place this am and reassess this evening.  Likely heart rate will improve.  Hydrate gently (some RV dysfunction).  Wean dopamine.    Rollene Rotunda  8:57 AM  10/19/2012

## 2012-10-19 NOTE — Progress Notes (Signed)
MD paged in regards to pt Connie Wagner on T, and need for increased amount of dopamine to sustain BP. Pacemaker sensitivity increased, no orders at this time. Will continue to monitor. VSS

## 2012-10-19 NOTE — Progress Notes (Signed)
Brief progress note  Patient reports feeling nervous. She is very agitated. She does have chest pain. She still has paced rhythm on Tel. Per Dr. Antoine Poche, will give 0.5 mg Ativan by IV.  Lorretta Harp, MD PGY2, Internal Medicine Teaching Service Pager: [phone]

## 2012-10-19 NOTE — Progress Notes (Signed)
Inpatient Diabetes Program Recommendations  AACE/ADA: New Consensus Statement on Inpatient Glycemic Control (2013)  Target Ranges:  Prepandial:   less than 140 mg/dL      Peak postprandial:   less than 180 mg/dL (1-2 hours)      Critically ill patients:  140 - 180 mg/dL   Inpatient Diabetes Program Recommendations Diet: add CARB modified to diet Order CBGs achs.   A1C=6.2 patient AT RISK for diabetes. Thank you  Piedad Climes BSN, RN,CDE Inpatient Diabetes Coordinator [phone] (team pager)

## 2012-10-20 ENCOUNTER — Inpatient Hospital Stay (HOSPITAL_COMMUNITY): Payer: Medicare Other

## 2012-10-20 DIAGNOSIS — I509 Heart failure, unspecified: Secondary | ICD-10-CM

## 2012-10-20 DIAGNOSIS — I5021 Acute systolic (congestive) heart failure: Secondary | ICD-10-CM

## 2012-10-20 DIAGNOSIS — I441 Atrioventricular block, second degree: Secondary | ICD-10-CM | POA: Diagnosis not present

## 2012-10-20 LAB — CBC
HCT: 28.6 % — ABNORMAL LOW (ref 36.0–46.0)
Hemoglobin: 9.1 g/dL — ABNORMAL LOW (ref 12.0–15.0)
MCV: 85.6 fL (ref 78.0–100.0)
RDW: 18.2 % — ABNORMAL HIGH (ref 11.5–15.5)
WBC: 9.9 10*3/uL (ref 4.0–10.5)

## 2012-10-20 LAB — FOLATE: Folate: >20 ng/mL

## 2012-10-20 LAB — COMPREHENSIVE METABOLIC PANEL
Albumin: 2.9 g/dL — ABNORMAL LOW (ref 3.5–5.2)
BUN: 30 mg/dL — ABNORMAL HIGH (ref 6–23)
Chloride: 101 mEq/L (ref 96–112)
Creatinine, Ser: 1.03 mg/dL (ref 0.50–1.10)
GFR calc Af Amer: 57 mL/min — ABNORMAL LOW (ref >90)
GFR calc non Af Amer: 49 mL/min — ABNORMAL LOW (ref >90)
Glucose, Bld: 128 mg/dL — ABNORMAL HIGH (ref 70–99)
Total Bilirubin: 0.2 mg/dL — ABNORMAL LOW (ref 0.3–1.2)

## 2012-10-20 LAB — IRON AND TIBC
Iron: <10 ug/dL — ABNORMAL LOW (ref 42–135)
UIBC: 279 ug/dL (ref 125–400)

## 2012-10-20 LAB — VITAMIN B12: Vitamin B-12: 357 pg/mL (ref 211–911)

## 2012-10-20 LAB — URINE CULTURE: Colony Count: >=100000

## 2012-10-20 LAB — RETICULOCYTES: Retic Count, Absolute: 68 10*3/uL (ref 19.0–186.0)

## 2012-10-20 MED ORDER — SODIUM CHLORIDE 0.9 % IV SOLN
INTRAVENOUS | Status: DC
  Administered 2012-10-20: 10:00:00 via INTRAVENOUS

## 2012-10-20 NOTE — Progress Notes (Signed)
Patient Name: Connie Wagner Date of Encounter: 10/20/2012  Principal Problem:   Acute MI, inferior wall, initial episode of care Active Problems:   HTN (hypertension)   Dementia   Complete heart block   Acute renal failure   Acute lower UTI   Acute systolic CHF (congestive heart failure)   Normocytic anemia   S/P drug eluting coronary stent placement   Coronary artery disease    SUBJECTIVE Patient became confused overnight, got out of bed and removed the board at the base of the bed; transvenous pacer found to be dislodged; dopamine not titrated down overnight  Feels better. Still mildly confused. In Wenckebach this am. Not requiring TVP. Still on dopamine 7. SBP 115. No CP.   CURRENT MEDS . aspirin  81 mg Oral Daily  . atorvastatin  80 mg Oral q1800  . cefTRIAXone (ROCEPHIN)  IV  1 g Intravenous Q24H  . clopidogrel  75 mg Oral Q breakfast  . zolpidem  5 mg Oral QHS    OBJECTIVE  Filed Vitals:   10/20/12 0500 10/20/12 0600 10/20/12 0700 10/20/12 0759  BP: 113/41 114/38 115/46   Pulse:      Temp:    97.5 F (36.4 C)  TempSrc:    Oral  Resp: 18 19 17    Height:      Weight:      SpO2: 98% 95% 99%     Intake/Output Summary (Last 24 hours) at 10/20/12 0802 Last data filed at 10/20/12 0700  Gross per 24 hour  Intake 1323.8 ml  Output    770 ml  Net  553.8 ml   Filed Weights   10/18/12 1238  Weight: 132 lb 11.5 oz (60.2 kg)    PHYSICAL EXAM  General: Pleasant, NAD. Neuro: Alert and oriented X place, person, birthdate, year, but still seems confused about exactly what is happening to her. Moves all extremities spontaneously. HEENT:  Normal  Neck: Supple without bruits or JVD. Lungs:  Resp regular and unlabored, CTA. Heart: irregular, not paced, no s3, s4, or murmurs. Abdomen: Soft, non-tender, non-distended, BS + x 4.  Extremities: No clubbing, cyanosis or edema. DP/PT/Radials 2+ and equal bilaterally. TVP in R groin  Accessory Clinical  Findings  CBC  Recent Labs  10/19/12 0104 10/20/12 0400  WBC 10.0 9.9  HGB 10.5* 9.1*  HCT 32.1* 28.6*  MCV 84.0 85.6  PLT 177 162   Basic Metabolic Panel  Recent Labs  10/19/12 0104 10/20/12 0400  NA 135 135  K 4.4 3.7  CL 99 101  CO2 22 24  GLUCOSE 122* 128*  BUN 43* 30*  CREATININE 1.68* 1.03  CALCIUM 8.2* 8.0*   Liver Function Tests  Recent Labs  10/18/12 1050 10/20/12 0400  AST 195* 68*  ALT 83* 56*  ALKPHOS 138* 128*  BILITOT 0.3 0.2*  PROT 7.1 6.6  ALBUMIN 3.2* 2.9*   Cardiac Enzymes  Recent Labs  10/18/12 1050  10/19/12 0953 10/19/12 1453 10/19/12 2138  CKTOTAL 681*  --   --   --   --   CKMB 25.1*  --   --   --   --   TROPONINI  --   < > 17.98* 16.05* 13.94*  < > = values in this interval not displayed.  Hemoglobin A1C  Recent Labs  10/18/12 1050  HGBA1C 6.2*   Fasting Lipid Panel  Recent Labs  10/18/12 1050  CHOL 140  HDL 59  LDLCALC 60  TRIG 103  CHOLHDL  2.4   Thyroid Function Tests  Recent Labs  10/18/12 1933  TSH 0.522    TELE - irregular with p waves  ECG - 2nd degree heart block  Cardiac Studies:  Cardiac Cath (10/18/12)  1. Acute inferior wall myocardial infarction complicated by complete AV block  2. Severe RCA stenosis treated successfully with primary PCI  3. Moderate LAD stenosis with heavy calcification  4. Widely patent left circumflex  5. Complete AV block treated with a temporary pacing wire   2D Echo (10/18/12)  - Left ventricle: The cavity size was normal. Wall thickness was increased in a pattern of mild LVH. The basal to mid inferior and inferoseptal walls were akinetic. The posterior wall was hypokinetic. The estimated ejection fraction was 45%. Doppler parameters are consistent with abnormal left ventricular relaxation (grade 1 diastolic dysfunction). - Aortic valve: Trileaflet; moderately calcified leaflets. Sclerosis without stenosis. - Mitral valve: Mild regurgitation. - Left atrium: The  atrium was mildly dilated. - Right ventricle: The cavity size was normal. Pacer wire or catheter noted in right ventricle. Systolic function was moderately reduced. - Tricuspid valve: Peak RV-RA gradient: 17mm Hg (S). - Pulmonary arteries: PA peak pressure: 32mm Hg (S). - Systemic veins: The IVC measured 2.3 cm with minimal respirophasic variation, suggesting RA pressure 15 mmHg.  Radiology/Studies Dg Chest Port 1 View 10/20/2012  *RADIOLOGY REPORT*  Clinical Data: Central line displacement.  PORTABLE CHEST - 1 VIEW  Comparison: 08/14/2012  Findings: Shallow inspiration.  Borderline heart size with normal pulmonary vascularity.  No focal consolidation or airspace disease. No blunting of costophrenic angles.  No pneumothorax.  A calcified and tortuous aorta.  Degenerative changes in the shoulders.  No central venous catheter is appreciated.  IMPRESSION: Shallow inspiration.  No evidence of active pulmonary disease.  No central venous catheter is appreciated.   Original Report Authenticated By: Burman Nieves, M.D.     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   #STEMI (Inferior wall): Day 2 s/p PCI with DES to RCA. No Diabetes (A1c 6.2).   -Continue ASA, Plavix, Statin  -BB held in setting of heart block   #Complete Heart Block: Secondary to inferior wall STEMI; Temporary pacer dislodged, consider removal  #Cardiogenic Shock: unable to wean Dopamine overnight because of agitation/confusion, wean during the day  #Acute Renal Failure: Resolved- Most likely prerenal in setting of perfuse diarrhea & shock, and UA revealed UTI.  #UTI: culture with >100000 ecoli; sensitivities pending, but sensitive to ceftriaxone in the past -Ceftriaxone 1g daily (Day 3/3)  #Acute systolic CHF: EF 16% and RV function reduced on echo. Secondary to inferior wall STEMI.  -ACEI at discharge -No BB in  setting of complete heart block   #Acute Non-bloody Diarrhea: Resolved. May be related to UTI or vagal response from MI.   #HTN: home regimen includes amlodipine 10 and HCTZ 12.5mg  daily  -Held in setting of cardiogenic shock & acute renal failure - renal failure resolved, but BP still soft  #Normocytic Anemia: Likely dilutional Hb drop to 9.1, no gross blood loss, monitor. (on 08/20/12, Hb 9.5)  #Dementia: sitter at bedside  #Dispo: pending clinical improvement, continues to require dopamine   #Code status: DNR   Signed, Stacy Gardner MD, PGY2   Patient seen and examined with Dr. Everardo Beals. We discussed all aspects of the encounter. I agree with the assessment and plan as stated above.  She is doing very well post MI. Can pull TVP. Wean dopamine quickly. Would not start b-blocker yet due to Wenckebach. Continue Plavix. Follow HGb closely. Keep in ICU today. Consult cardiac rehab   Will need rehab on return to [facility].   Slater Mcmanaman,MD 9:07 AM

## 2012-10-20 NOTE — Progress Notes (Signed)
10/20/2012  1040  Venous sheath removed and pressure held for 15 minutes. Pressure dssg on site level 0. Tol well.  Dopamine gtt off. Tarek Cravens, Linnell Fulling

## 2012-10-20 NOTE — Plan of Care (Addendum)
Cardiology Cross cover note  Connie Wagner was found to have her tranvenous wires dislodged overnight due to her agitation and frequent movements.  She is asymptomatic and hemodynamically stable.  Appears to still be in heart block with junctional escape HR in 60s.  Plan: Will get CXR, EKG Continue dopamine Monitor closely for any urgent intervention

## 2012-10-20 NOTE — Progress Notes (Signed)
2/22  TVP wire removed by Dr. Jeannett Senior, Linnell Fulling

## 2012-10-20 NOTE — Progress Notes (Signed)
CARDIAC REHAB PHASE I   PRE:  Rate/Rhythm: 76SR  BP:  Supine: 100/46  Sitting:   Standing:    SaO2: 100%RA  MODE:  Ambulation: 250 ft   POST:  Rate/Rhythem: 108ST  BP:  Supine:   Sitting: 128/49  Standing:    SaO2: 96%RA 1400-1430 Pt walked 250 ft on RA with rolling walker and asst x 2. Talkative and enjoyed walk. To recliner with call bell. Family in room. Denied CP. Tolerated well. Can be asst x 1 with rolling walker.   Duanne Limerick

## 2012-10-21 DIAGNOSIS — I442 Atrioventricular block, complete: Secondary | ICD-10-CM

## 2012-10-21 LAB — BASIC METABOLIC PANEL WITH GFR
BUN: 18 mg/dL (ref 6–23)
CO2: 24 meq/L (ref 19–32)
Calcium: 8 mg/dL — ABNORMAL LOW (ref 8.4–10.5)
Chloride: 105 meq/L (ref 96–112)
Creatinine, Ser: 0.73 mg/dL (ref 0.50–1.10)
GFR calc Af Amer: 90 mL/min — ABNORMAL LOW
GFR calc non Af Amer: 77 mL/min — ABNORMAL LOW
Glucose, Bld: 96 mg/dL (ref 70–99)
Potassium: 3.9 meq/L (ref 3.5–5.1)
Sodium: 136 meq/L (ref 135–145)

## 2012-10-21 LAB — CBC
HCT: 27.5 % — ABNORMAL LOW (ref 36.0–46.0)
Hemoglobin: 8.8 g/dL — ABNORMAL LOW (ref 12.0–15.0)
MCV: 85.9 fL (ref 78.0–100.0)
RBC: 3.2 MIL/uL — ABNORMAL LOW (ref 3.87–5.11)
WBC: 6.2 10*3/uL (ref 4.0–10.5)

## 2012-10-21 MED ORDER — HYDROCHLOROTHIAZIDE 12.5 MG PO CAPS
12.5000 mg | ORAL_CAPSULE | Freq: Every day | ORAL | Status: DC
  Administered 2012-10-21 – 2012-10-23 (×3): 12.5 mg via ORAL
  Filled 2012-10-21 (×3): qty 1

## 2012-10-21 NOTE — Progress Notes (Signed)
Patient transferred from 2900 to room 4708 via hospital bed. Patient oriented to room and call bell system. Patient is requesting dinner tray and will follow-up. Patient complains of no pain or discomfort. Will continue to monitor to end of shift.

## 2012-10-21 NOTE — Progress Notes (Addendum)
Patient Name: Connie Wagner Date of Encounter: 10/21/2012  Principal Problem:   Acute MI, inferior wall, initial episode of care Active Problems:   HTN (hypertension)   Dementia   Complete heart block   Acute renal failure   Acute lower UTI   Acute systolic CHF (congestive heart failure)   Normocytic anemia   S/P drug eluting coronary stent placement   Coronary artery disease   Wenckebach second degree AV block    SUBJECTIVE  - Still intermittently confused overnight, wanted to go out of bed. No chest pain - Started and tolerated cardiac rehab. Pt walked 250 ft.  - off pressor, Bp is 96/31 mmHg in AM. - off VTP,  - Tele: Sinus rhythm with occasionally missed beats, HR is around 70/min in AM. - Hgb: 9.1-->8.8 - Urine:  840 ml. - Cre: 0.73  CURRENT MEDS . aspirin  81 mg Oral Daily  . atorvastatin  80 mg Oral q1800  . clopidogrel  75 mg Oral Q breakfast  . zolpidem  5 mg Oral QHS    OBJECTIVE  Filed Vitals:   10/21/12 0200 10/21/12 0300 10/21/12 0400 10/21/12 0500  BP: 92/39 134/44 130/49 100/43  Pulse:   72   Temp:   98 F (36.7 C)   TempSrc:   Oral   Resp: 20 20 23 20   Height:      Weight:      SpO2:   98%     Intake/Output Summary (Last 24 hours) at 10/21/12 4401 Last data filed at 10/21/12 0500  Gross per 24 hour  Intake 1537.89 ml  Output    920 ml  Net 617.89 ml   Filed Weights   10/18/12 1238  Weight: 132 lb 11.5 oz (60.2 kg)    PHYSICAL EXAM  General: Pleasant, NAD.  Neuro: Alert and oriented, but still seems confused about exactly what is happening to her. Moves all extremities spontaneously. HEENT:  Normal  Neck: Supple without bruits. JVP 8-9. Lungs:  Resp regular and unlabored, CTA. Heart: RRR, No s3, s4, or murmurs. Abdomen: Soft, non-tender, non-distended, BS + x 4.  Extremities: No clubbing, cyanosis or edema. DP/PT/Radials 2+ and equal bilaterally. Brace on R arm.  Accessory Clinical Findings  CBC  Recent Labs   10/20/12 0400 10/21/12 0445  WBC 9.9 6.2  HGB 9.1* 8.8*  HCT 28.6* 27.5*  MCV 85.6 85.9  PLT 162 152   Basic Metabolic Panel  Recent Labs  10/20/12 0400 10/21/12 0445  NA 135 136  K 3.7 3.9  CL 101 105  CO2 24 24  GLUCOSE 128* 96  BUN 30* 18  CREATININE 1.03 0.73  CALCIUM 8.0* 8.0*   Liver Function Tests  Recent Labs  10/18/12 1050 10/20/12 0400  AST 195* 68*  ALT 83* 56*  ALKPHOS 138* 128*  BILITOT 0.3 0.2*  PROT 7.1 6.6  ALBUMIN 3.2* 2.9*   Cardiac Enzymes  Recent Labs  10/18/12 1050  10/19/12 0953 10/19/12 1453 10/19/12 2138  CKTOTAL 681*  --   --   --   --   CKMB 25.1*  --   --   --   --   TROPONINI  --   < > 17.98* 16.05* 13.94*  < > = values in this interval not displayed.  Hemoglobin A1C  Recent Labs Lab 10/18/12 1050 10/18/12 1054 10/19/12 0104 10/20/12 0400 10/21/12 0445  HGB 10.2* 11.2* 10.5* 9.1* 8.8*   Fasting Lipid Panel  Recent Labs  10/18/12 1050  CHOL 140  HDL 59  LDLCALC 60  TRIG 103  CHOLHDL 2.4   Thyroid Function Tests  Recent Labs  10/18/12 1933  TSH 0.522    ECG - no new EKG today  Cardiac Studies:  Cardiac Cath (10/18/12)  1. Acute inferior wall myocardial infarction complicated by complete AV block  2. Severe RCA stenosis treated successfully with primary PCI  3. Moderate LAD stenosis with heavy calcification  4. Widely patent left circumflex  5. Complete AV block treated with a temporary pacing wire   2D Echo (10/18/12)  - Left ventricle: The cavity size was normal. Wall thickness was increased in a pattern of mild LVH. The basal to mid inferior and inferoseptal walls were akinetic. The posterior wall was hypokinetic. The estimated ejection fraction was 45%. Doppler parameters are consistent with abnormal left ventricular relaxation (grade 1 diastolic dysfunction). - Aortic valve: Trileaflet; moderately calcified leaflets. Sclerosis without stenosis. - Mitral valve: Mild regurgitation. - Left atrium:  The atrium was mildly dilated. - Right ventricle: The cavity size was normal. Pacer wire or catheter noted in right ventricle. Systolic function was moderately reduced. - Tricuspid valve: Peak RV-RA gradient: 17mm Hg (S). - Pulmonary arteries: PA peak pressure: 32mm Hg (S). - Systemic veins: The IVC measured 2.3 cm with minimal respirophasic variation, suggesting RA pressure 15 mmHg.  Radiology/Studies Dg Chest Port 1 View 10/20/2012  *RADIOLOGY REPORT*  Clinical Data: Central line displacement.  PORTABLE CHEST - 1 VIEW  Comparison: 08/14/2012  Findings: Shallow inspiration.  Borderline heart size with normal pulmonary vascularity.  No focal consolidation or airspace disease. No blunting of costophrenic angles.  No pneumothorax.  A calcified and tortuous aorta.  Degenerative changes in the shoulders.  No central venous catheter is appreciated.  IMPRESSION: Shallow inspiration.  No evidence of active pulmonary disease.  No central venous catheter is appreciated.   Original Report Authenticated By: Burman Nieves, M.D.     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   #STEMI (Inferior wall): Day 3 s/p PCI with DES to RCA. No Diabetes (A1c 6.2).   -Continue ASA, Plavix, Statin  - may start BB if continue to have sinus rhythm without block   #Complete Heart Block: Resolved.  Off  temporary VTP. Currently sinus regular rhythm.   #Cardiogenic Shock: resolbed. Off Dopamine gtt, SBP>90 mmHg.  #Acute Renal Failure: Resolved- Most likely prerenal in setting of perfuse diarrhea & shock, and UA revealed UTI.  Recent Labs Lab 10/18/12 1050 10/18/12 1054 10/19/12 0104 10/20/12 0400 10/21/12 0445  CREATININE 3.03* 3.30* 1.68* 1.03 0.73     #UTI: culture with >100000 ecoli; sensitivities pending, but sensitive to ceftriaxone in the past. Completed 3 days of Ceftriaxone 1g daily (Day  3/3). -will d/c Ceftriaxone  #Acute systolic CHF: EF 16% and RV function reduced on echo. Secondary to inferior wall STEMI.  -ACEI at discharge  #Acute Non-bloody Diarrhea: Resolved. May be related to UTI or vagal response from MI. Hgb is stable.   Recent Labs Lab 10/18/12 1050 10/18/12 1054 10/19/12 0104 10/20/12 0400 10/21/12 0445  HGB 10.2* 11.2* 10.5* 9.1* 8.8*    #HTN: Home regimen includes amlodipine 10 and HCTZ 12.5mg  daily  -Held in setting of soft BP.  #Normocytic Anemia: Likely dilutional. Hgb stable. Hgb 9/1-->8.8  #Dementia: sitter at bedside  #Dispo: transfer to Tele  #Code status: DNR   Signed, Lorretta Harp MD,  PGY2   Patient seen and examined with Dr. Clyde Lundborg. We discussed all aspects of the encounter. I agree with the assessment and plan as stated above.   She is much improved. Heart block has resolved. Can transfer to floor. Given recent HB and low BP will not start b-blocker yet. JVP up slightly. Will restart home HCTZ and follow closely.   UTI has been treated.  Possibly back to [facility]. Will need to be in there rehab section for a while. Continue cardiac rehab.  Jebidiah Baggerly,MD 8:26 AM

## 2012-10-22 MED ORDER — ENOXAPARIN SODIUM 40 MG/0.4ML ~~LOC~~ SOLN
40.0000 mg | SUBCUTANEOUS | Status: DC
  Administered 2012-10-22: 40 mg via SUBCUTANEOUS
  Filled 2012-10-22 (×2): qty 0.4

## 2012-10-22 MED ORDER — CARVEDILOL 3.125 MG PO TABS
3.1250 mg | ORAL_TABLET | Freq: Two times a day (BID) | ORAL | Status: DC
  Administered 2012-10-22 – 2012-10-23 (×3): 3.125 mg via ORAL
  Filled 2012-10-22 (×6): qty 1

## 2012-10-22 MED ORDER — FERROUS SULFATE 325 (65 FE) MG PO TABS
325.0000 mg | ORAL_TABLET | Freq: Two times a day (BID) | ORAL | Status: DC
Start: 2012-10-22 — End: 2012-10-23
  Administered 2012-10-22 – 2012-10-23 (×2): 325 mg via ORAL
  Filled 2012-10-22 (×5): qty 1

## 2012-10-22 NOTE — Progress Notes (Signed)
CARDIAC REHAB PHASE I   PRE:  Rate/Rhythm: 83 SR with PAC    BP: sitting 120/60    SaO2: 99 RA  MODE:  Ambulation: 500 ft   POST:  Rate/Rhythm: 104 ST    BP: sitting 110/50     SaO2: 98 RA  Tolerated very well with RW. Steady, alert and oriented. Feels good, thankful for walk. Talkative. Began ed. Pt very receptive. Asked appropriate questions. No confusion noted. Pt does not want to d/c today. Wants to arrange a  "caregiver" to stay with her at [facility] in her independent section. Pt requests a RW for home. Not interested in [facility]II. Sts she will get involved with ex again at [facility]. Will f/u. [phone]  Harriet Masson CES, ACSM

## 2012-10-22 NOTE — Progress Notes (Signed)
ANTICOAGULATION CONSULT NOTE - Initial Consult  Pharmacy Consult for lovenox Indication: VTE prophylaxis  Allergies  Allergen Reactions  . Penicillins Rash  . Sulfa Drugs Cross Reactors Rash    Patient Measurements: Height: 5' (152.4 cm) Weight: 137 lb 1.6 oz (62.188 kg) (a scale) IBW/kg (Calculated) : 45.5 Labs:  Recent Labs  10/19/12 1453 10/19/12 2138 10/20/12 0400 10/21/12 0445  HGB  --   --  9.1* 8.8*  HCT  --   --  28.6* 27.5*  PLT  --   --  162 152  CREATININE  --   --  1.03 0.73  TROPONINI 16.05* 13.94*  --   --     Estimated Creatinine Clearance: 44.7 ml/min (by C-G formula based on Cr of 0.73).   Medical History: Past Medical History  Diagnosis Date  . Blood transfusion   . Anemia   . Upper GI bleeding 09/14/11    "have had it once before today"  . Migraines     "haven't had one since going thru menopause"  . Depression   . Anxiety   . Rheumatoid arthritis   . Chronic lower back pain   . Hx: UTI (urinary tract infection)     "every now and then"  . Hypertension     pcp dr Pete Glatter  . Dementia     needs redirection freq.  . 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 Patient to start lovenox for VTE prophylaxis. SCr was elevated at admit  (3.3) but now the ARF has resolved (SCr= 0.73).   Plan:  -Will give lovenox 40mg  sq q24hr -Will sign off. Please contact pharmacy with any futher needs  Thank you for asking pharmacy to be involved in the care of this patient.  Harland German, Pharm D 10/22/2012 2:44 PM

## 2012-10-22 NOTE — Progress Notes (Signed)
Patient Name: Connie Wagner Date of Encounter: 10/22/2012  Principal Problem:   Acute MI, inferior wall, initial episode of care Active Problems:   HTN (hypertension)   Dementia   Complete heart block   Acute renal failure   Acute lower UTI   Acute systolic CHF (congestive heart failure)   Normocytic anemia   S/P drug eluting coronary stent placement   Coronary artery disease   Wenckebach second degree AV block    SUBJECTIVE: No chest pain or SOB, ambulating better but still shaky at times. Feels she may need a walker. Wants to talk with staff about help after d/c. Worried about the confusion she has had at night.   OBJECTIVE Filed Vitals:   10/21/12 1300 10/21/12 1339 10/21/12 2124 10/22/12 0636  BP:   129/64 128/49  Pulse:   82 74  Temp:   98.8 F (37.1 C) 98.6 F (37 C)  TempSrc:   Oral Oral  Resp:   20 20  Height: 5' (1.524 m)     Weight:  138 lb 9.6 oz (62.869 kg)  137 lb 1.6 oz (62.188 kg)  SpO2:   97% 98%    Intake/Output Summary (Last 24 hours) at 10/22/12 1010 Last data filed at 10/22/12 0854  Gross per 24 hour  Intake    920 ml  Output   1500 ml  Net   -580 ml   Filed Weights   10/18/12 1238 10/21/12 1339 10/22/12 0636  Weight: 132 lb 11.5 oz (60.2 kg) 138 lb 9.6 oz (62.869 kg) 137 lb 1.6 oz (62.188 kg)    PHYSICAL EXAM General: Well developed, well nourished, female in no acute distress. Head: Normocephalic, atraumatic.  Neck: Supple without bruits, JVD not elevated. Lungs:  Resp regular and unlabored, very few rales. Heart: RRR, S1, S2, no S3, S4, or murmur; no rub. Abdomen: Soft, non-tender, non-distended, BS + x 4.  Extremities: No clubbing, cyanosis, no edema.  Neuro: Alert and oriented X 3. Moves all extremities spontaneously. Psych: Normal affect.  LABS: CBC: Recent Labs  10/20/12 0400 10/21/12 0445  WBC 9.9 6.2  HGB 9.1* 8.8*  HCT 28.6* 27.5*  MCV 85.6 85.9  PLT 162 152   Basic Metabolic Panel: Recent Labs  10/20/12 0400  10/21/12 0445  NA 135 136  K 3.7 3.9  CL 101 105  CO2 24 24  GLUCOSE 128* 96  BUN 30* 18  CREATININE 1.03 0.73  CALCIUM 8.0* 8.0*   Liver Function Tests: Recent Labs  10/20/12 0400  AST 68*  ALT 56*  ALKPHOS 128*  BILITOT 0.2*  PROT 6.6  ALBUMIN 2.9*   Cardiac Enzymes: Recent Labs  10/19/12 1453 10/19/12 2138  TROPONINI 16.05* 13.94*   Anemia Panel: Recent Labs  10/20/12 1030  VITAMINB12 357  FOLATE >20.0  FERRITIN 26  TIBC Not calculated due to Iron <10.  IRON <10*  RETICCTPCT 2.2    TELE:   SR, occ ST, no block/bradycardia  Radiology/Studies: Dg Chest Port 1 View 10/20/2012  *RADIOLOGY REPORT*  Clinical Data: Central line displacement.  PORTABLE CHEST - 1 VIEW  Comparison: 08/14/2012  Findings: Shallow inspiration.  Borderline heart size with normal pulmonary vascularity.  No focal consolidation or airspace disease. No blunting of costophrenic angles.  No pneumothorax.  A calcified and tortuous aorta.  Degenerative changes in the shoulders.  No central venous catheter is appreciated.  IMPRESSION: Shallow inspiration.  No evidence of active pulmonary disease.  No central venous catheter is appreciated.  Original Report Authenticated By: Burman Nieves, M.D.     Current Medications:  . aspirin  81 mg Oral Daily  . atorvastatin  80 mg Oral q1800  . clopidogrel  75 mg Oral Q breakfast  . hydrochlorothiazide  12.5 mg Oral Daily  . zolpidem  5 mg Oral QHS   . sodium chloride 20 mL/hr at 10/20/12 1610    ASSESSMENT AND PLAN: Principal Problem:   Acute MI, inferior wall, initial episode of care - Day 4,  S/p DES RCA, think OK to add low-dose Coreg  Active Problems:    HTN (hypertension) - will try 3.125 Coreg,  PTA amlodipine 10 mg and HCTZ 12.5, now only on HCTZ 12.5.     Dementia - possibly sun-downing also. Pt to discuss care options with case mgt/social work    Complete heart block - resolved    Acute renal failure - BUN/Cr 48/3.03 on admit, no ACE       Acute lower UTI - ABX completed, no Sx    Acute systolic CHF (congestive heart failure) - low-dose daily diuretic, try BB, follow volume status closely.    Normocytic anemia - iron deficiency, PTA on QD Iron, increase to BID.    Wenckebach second degree AV block - none seen on telemetry.  Plan - d/c when medically stable, hopefully arrangements can be made for AM. May need walker, have PT see.  Signed, Theodore Demark , PA-C 10:10 AM 10/22/2012  Patient seen, examined. Available data reviewed. Agree with findings, assessment, and plan as outlined by Theodore Demark, PA-C. Pt looks great, much improved. Agree start carvedilol today. Watch until tomorrow am to make sure rhythm stable. Current meds reviewed and agree. Creatinine markedly elevated on arrival. I think risk?benefit of ACE is unfavorable. Anticipate discharge tomorrow am.   Tonny Bollman, M.D. 10/22/2012 2:12 PM

## 2012-10-23 DIAGNOSIS — R570 Cardiogenic shock: Secondary | ICD-10-CM | POA: Diagnosis present

## 2012-10-23 DIAGNOSIS — R5381 Other malaise: Secondary | ICD-10-CM | POA: Diagnosis present

## 2012-10-23 MED ORDER — CLOPIDOGREL BISULFATE 75 MG PO TABS: 75.0000 mg | ORAL_TABLET | Freq: Every day | ORAL | Status: DC

## 2012-10-23 MED ORDER — FERROUS SULFATE 325 (65 FE) MG PO TABS: 325.0000 mg | ORAL_TABLET | Freq: Two times a day (BID) | ORAL | Status: DC

## 2012-10-23 MED ORDER — ATORVASTATIN CALCIUM 80 MG PO TABS: 80.0000 mg | ORAL_TABLET | Freq: Every day | ORAL | Status: DC

## 2012-10-23 MED ORDER — TEMAZEPAM 15 MG PO CAPS
15.0000 mg | ORAL_CAPSULE | Freq: Once | ORAL | Status: AC
  Administered 2012-10-23: 15 mg via ORAL
  Filled 2012-10-23: qty 1

## 2012-10-23 MED ORDER — ASPIRIN 81 MG PO TABS: 81.0000 mg | ORAL_TABLET | Freq: Every day | ORAL | Status: DC

## 2012-10-23 MED ORDER — CARVEDILOL 3.125 MG PO TABS: 3.1250 mg | ORAL_TABLET | Freq: Two times a day (BID) | ORAL | Status: DC

## 2012-10-23 NOTE — Progress Notes (Signed)
    Subjective:  Feels great this morning. No chest pain or dyspnea. No palpitations.  Objective:  Vital Signs in the last 24 hours: Temp:  [98.2 F (36.8 C)-99.4 F (37.4 C)] 98.8 F (37.1 C) (02/25 0609) Pulse Rate:  [73-89] 73 (02/25 0609) Resp:  [19-20] 20 (02/25 0609) BP: (115-144)/(52-69) 143/59 mmHg (02/25 0609) SpO2:  [96 %-100 %] 96 % (02/25 0609) Weight:  [61.735 kg (136 lb 1.6 oz)] 61.735 kg (136 lb 1.6 oz) (02/25 0609)  Intake/Output from previous day: 02/24 0701 - 02/25 0700 In: 720 [P.O.:720] Out: 1050 [Urine:1050]  Physical Exam: Pt is alert and oriented, pleasant elderly woman in NAD HEENT: normal Neck: JVP - normal Lungs: CTA bilaterally CV: RRR without murmur or gallop Abd: soft, NT, Positive BS, no hepatomegaly Ext: no C/C/E, distal pulses intact and equal Skin: warm/dry no rash   Lab Results:  Recent Labs  10/21/12 0445  WBC 6.2  HGB 8.8*  PLT 152    Recent Labs  10/21/12 0445  NA 136  K 3.9  CL 105  CO2 24  GLUCOSE 96  BUN 18  CREATININE 0.73   No results found for this basename: TROPONINI, CK, MB,  in the last 72 hours  Tele: Normal sinus rhythm, no significant arrhythmia, personally reviewed  Assessment/Plan:  1. Acute inferior wall myocardial infarction complicated by complete heart block and cardiogenic shock. The patient has had a remarkable recovery. Her heart block has resolved. She is tolerating a beta blocker. She should continue on her current medical program which includes dual antiplatelet therapy, a statin drug, and low dose carvedilol. Recommend followup next week. I think she is stable for discharge today.  2. Hypertension: Blood pressure is controlled. I would avoid an ACE inhibitor as she came in to the hospital in acute renal failure and I think her risk of acute kidney injury in the future is significant if she has further problems.  3. Acute renal failure, resolved  Tonny Bollman, M.D. 10/23/2012, 9:59  AM

## 2012-10-23 NOTE — Progress Notes (Signed)
Discussed ex and NTG with pt. Pt is "worked up" this am in regards to d/c. Frustrated with process. Now saying she only wants HHRN at home, doesn't feel she needs supervision 24 hrs like discussed yesterday or even at night which I am encouraging. Pt's persona has changed since yesterday's discussion when she was very pleasant, upbeat and appreciative. Today she is overwhelmed and flustered. Pt could not recall Plavix or stent card information as we discussed yesterday. Sts she does not usually use a pill box for meds. Pt does not want to bother her daughter who is her only support. PT to see today. Pt needs order for RW and questionably HHRN. [phone] Ethelda Chick CES, ACSM

## 2012-10-23 NOTE — Progress Notes (Signed)
10/23/12 1515 Noted new orders for DME rollator and [facility] PT/Ot.  Pt. is from [facility].  TC to [place], with [facility] to obtain rollator for pt.  Faxed HH PT/OT orders to [facility] for their [facility] to provide services for [facility] PT/OT at [phone]).  Pt. to dc back to [facility] today with daughter. Tera Mater, RN, BSN NCM [phone]

## 2012-10-23 NOTE — Evaluation (Signed)
Physical Therapy Evaluation Patient Details Name: Connie Wagner MRN: [id number] DOB: 06/18/1929 Today's Date: 10/23/2012 Time: 1050-1130 PT Time Calculation (min): 40 min  PT Assessment / Plan / Recommendation Clinical Impression  Pt s/p MI with decr mobility secondary to decr endurance for activity and decr balance. Will benefit from PT to address endurance and balance issues.      PT Assessment  Patient needs continued PT services    Follow Up Recommendations  Home health PT                Equipment Recommendations  Other (comment) (Rollator )         Frequency Min 3X/week    Precautions / Restrictions Precautions Precautions: Fall Restrictions Weight Bearing Restrictions: No   Pertinent Vitals/Pain VSS, No pain      Mobility  Bed Mobility Bed Mobility: Rolling Right;Right Sidelying to Sit Rolling Right: 7: Independent Right Sidelying to Sit: 7: Independent Transfers Transfers: Sit to Stand;Stand to Sit Sit to Stand: 7: Independent Stand to Sit: 7: Independent Ambulation/Gait Ambulation/Gait Assistance: 5: Supervision Ambulation Distance (Feet): 180 Feet Assistive device: 4-wheeled walker Ambulation/Gait Assistance Details: Pt able to ambulate with 4 wheeled RW without LOB and with good stability and postural control. DOE 3/4 with sats >95%.   Gait Pattern: Step-through pattern;Decreased stride length Gait velocity: decreased Stairs: No Wheelchair Mobility Wheelchair Mobility: No         PT Diagnosis: Generalized weakness  PT Problem List: Decreased activity tolerance;Decreased balance;Decreased mobility;Decreased knowledge of precautions;Decreased safety awareness;Decreased knowledge of use of DME PT Treatment Interventions: Gait training;DME instruction;Functional mobility training;Therapeutic activities;Therapeutic exercise;Balance training;Patient/family education   PT Goals Acute Rehab PT Goals PT Goal Formulation: With patient Time For Goal  Achievement: 10/30/12 Potential to Achieve Goals: Good Pt will Ambulate: >150 feet;with modified independence;with least restrictive assistive device PT Goal: Ambulate - Progress: Goal set today Pt will Perform Home Exercise Program: Independently PT Goal: Perform Home Exercise Program - Progress: Goal set today  Visit Information  Last PT Received On: 10/23/12 Assistance Needed: +1    Subjective Data  Subjective: "I would like a walker like that with 4 wheels and a seat." Patient Stated Goal: To go home   Prior Functioning  Home Living Lives With: Alone Available Help at Discharge: Family;Available PRN/intermittently Type of Home: House Home Access: Level entry Home Layout: One level Bathroom Shower/Tub: Health visitor: Standard Home Adaptive Equipment: Grab bars in shower;Hand-held shower hose;Shower chair with back Prior Function Level of Independence: Independent Vocation: Retired Musician: No difficulties    Copywriter, advertising Overall Cognitive Status: Appears within functional limits for tasks assessed/performed Arousal/Alertness: Awake/alert Orientation Level: Appears intact for tasks assessed Behavior During Session: [facility] for tasks performed    Extremity/Trunk Assessment Right Lower Extremity Assessment RLE ROM/Strength/Tone: [facility] for tasks assessed Left Lower Extremity Assessment LLE ROM/Strength/Tone: [facility] for tasks assessed Trunk Assessment Trunk Assessment: Normal   Balance Standardized Balance Assessment Standardized Balance Assessment: Dynamic Gait Index Dynamic Gait Index Level Surface: Normal Change in Gait Speed: Normal Gait with Horizontal Head Turns: Normal Gait with Vertical Head Turns: Normal Gait and Pivot Turn: Normal Step Over Obstacle: Mild Impairment Step Around Obstacles: Mild Impairment Steps: Mild Impairment Total Score: 21 High Level Balance High Level Balance Comments: Pt able to perform all  activities with good balance overall.    End of Session PT - End of Session Equipment Utilized During Treatment: Gait belt Activity Tolerance: Patient limited by fatigue Patient left: in bed;with  call bell/phone within reach Nurse Communication: Mobility status       INGOLD,Clee Pandit 10/23/2012, 12:15 PM  [facility] Acute Rehabilitation [phone] (pager)

## 2012-10-23 NOTE — Discharge Summary (Signed)
CARDIOLOGY DISCHARGE SUMMARY   Patient ID: Connie Wagner MRN: [id number] DOB/AGE: 1928-12-11 79 y.o.  Admit date: 10/18/2012 Discharge date: 10/23/2012  Primary Discharge Diagnosis:     Acute MI, inferior wall, initial episode of care Secondary Discharge Diagnosis:    Cardiogenic shock   HTN (hypertension)   Dementia   Complete heart block   Acute renal failure   Acute lower UTI   Acute systolic CHF (congestive heart failure)   Normocytic anemia   S/P drug eluting coronary stent placement   Coronary artery disease   Wenckebach second degree AV block   Physical deconditioning  Consults:   Procedures: Left Heart Cath, Selective Coronary Angiography, LV angiography, PTCA/Stent of the RCA, insertion of temporary pacemaker under fluoroscopic guidance, closure of the RFA with a Mynx device   Hospital Course: Connie Wagner is a 79 y.o. female with no history of CAD. She had severe diarrhea and came to the emergency room by EMS. An ECG showed inferior ST elevation. She had some right shoulder discomfort. Because of her ST elevation she was taken directly to the Cath Lab.  Cardiac catheterization results are listed in fold below. She had a 2.5 x 24 mm drug-eluting stent to the RCA. The case was complicated by complete AV block and she required a temporary pacing wire. She was admitted to the CCU.  A 2-D echocardiogram was performed with results below. She was started on aspirin, a statin and Plavix. No beta blocker was initially used because of her bradycardia. She had cardiogenic shock from her MI and required dopamine for blood pressure support.  Her condition gradually improved. The dopamine was weaned and discontinued. By 10/20/2012, for bradycardia and heart block had resolved. The pacing wires were removed and she tolerated this procedure well. Once the pressors and pacing wires were removed, she was seen by cardiac rehabilitation and began ambulating. She was very weak with  ambulation and required assistance. She was having some problems prior to admission and was using a cane. However, she continued to have problems and was seen by physical therapy. They recommended continued physical therapy services and these will be ordered.  Her EF was decreased by echocardiogram but she had been in acute renal failure on admission with creatinine greater than 3. Because of this, no ACE inhibitor will be used at this time. After she has maintained a sinus rhythm for 48 hours, a low-dose beta blocker was added to her medication regimen. She is currently tolerating this well.  The patient has a history of dementia. She lives in an independent-living facility. There was concern about her living alone just after discharge. She will have outpatient physical therapy and home health RN after discharge. She also has some family support.  She has a history of iron deficiency anemia that is normocytic. She was anemic on admission and hemoglobin dropped slightly during her stay. She had an iron profile checked and her iron level was less than 10. She had been on iron supplementation daily prior to admission and this was increased to twice a day. She will follow up with primary care for this.  By 10/23/2012, Connie Wagner was weak but her ambulation had improved. She had one of the seated walkers ordered which was helping with ambulation. She was having no chest pain or shortness of breath with ambulation. She was evaluated by Dr. Excell Seltzer and considered stable for discharge, to follow up as an outpatient.  Labs:  Lab Results  Component Value Date  WBC 6.2 10/21/2012   HGB 8.8* 10/21/2012   HCT 27.5* 10/21/2012   MCV 85.9 10/21/2012   PLT 152 10/21/2012     Recent Labs Lab 10/20/12 0400 10/21/12 0445  NA 135 136  K 3.7 3.9  CL 101 105  CO2 24 24  BUN 30* 18  CREATININE 1.03 0.73  CALCIUM 8.0* 8.0*  PROT 6.6  --   BILITOT 0.2*  --   ALKPHOS 128*  --   ALT 56*  --   AST 68*  --     GLUCOSE 128* 96   Lab Results  Component Value Date   CKTOTAL 681* 10/18/2012   CKMB 25.1* 10/18/2012   TROPONINI 13.94* 10/19/2012   Lipid Panel     Component Value Date/Time   CHOL 140 10/18/2012 1050   TRIG 103 10/18/2012 1050   HDL 59 10/18/2012 1050   CHOLHDL 2.4 10/18/2012 1050   VLDL 21 10/18/2012 1050   LDLCALC 60 10/18/2012 1050    Radiology: Dg Chest Port 1 View 10/20/2012  *RADIOLOGY REPORT*  Clinical Data: Central line displacement.  PORTABLE CHEST - 1 VIEW  Comparison: 08/14/2012  Findings: Shallow inspiration.  Borderline heart size with normal pulmonary vascularity.  No focal consolidation or airspace disease. No blunting of costophrenic angles.  No pneumothorax.  A calcified and tortuous aorta.  Degenerative changes in the shoulders.  No central venous catheter is appreciated.  IMPRESSION: Shallow inspiration.  No evidence of active pulmonary disease.  No central venous catheter is appreciated.   Original Report Authenticated By: Burman Nieves, M.D.     Cardiac Cath: 10/18/2012 Left mainstem: Moderate calcification, there is diffuse distal 30% left main stenosis.  Left anterior descending (LAD): The LAD is a large caliber vessel. There is heavy proximal calcification. Just after the first septal perforator there is 50% calcified stenosis. There are no high-grade lesions throughout the course of the LAD. The diagonal branches are patent with mild 20-30% stenosis.  Left circumflex (LCx): The obtuse marginal branches of the circumflex are patent. The AV groove circumflex is small. There are 2 moderate caliber OM's without significant disease.  Right coronary artery (RCA): The RCA is dominant. The proximal vessel is patent. The mid vessel has heavily calcified severe stenosis with a 99% lesion. There is TIMI 2 flow into the distal branch vessels of the RCA. There is some competitive left to right collateral flow. The distal RCA is diffusely diseased without high-grade stenosis.   Left ventriculography: Deferred because of elevated creatinine PCI Data:  Vessel - RCA/Segment - mid  Percent Stenosis (pre) 99  TIMI-flow 2  Stent 2.5 x 24 mm drug-eluting  Percent Stenosis (post) 0  TIMI-flow (post) 3  Final Conclusions:  1. Acute inferior wall myocardial infarction complicated by complete AV block  2. Severe RCA stenosis treated successfully with primary PCI  3. Moderate LAD stenosis with heavy calcification  4. Widely patent left circumflex  5. Complete AV block treated with a temporary pacing wire  EKG: 20-Oct-2012 06:25:19 [facility] ROUTINE RECORD Sinus rhythm with 2nd degree A-V block (Mobitz I) Inferior infarct , possibly acute Cannot rule out Anterior infarct , age undetermined * ** 50 * ACUTE MI ** ** ** ** Consider right ventricular involvement in acute inferior infarct Abnormal ECG Inappropriate pacing spikes Recommend serial ECG 79mm/s 32mm/mV 100Hz  8.0.1 12SL 239 CID: 1 Referred by: MICHAEL COOPER Confirmed By: Nicki Guadalajara MD Vent. rate 66 BPM PR interval * Connie QRS duration 94 Connie  QT/QTc 382/400 Connie P-R-T axes * -7 99  Echo:  10/18/2012 - Left ventricle: The cavity size was normal. Wall thickness was increased in a pattern of mild LVH. The basal to mid inferior and inferoseptal walls were akinetic. The posterior wall was hypokinetic. The estimated ejection fraction was 45%. Doppler parameters are consistent with abnormal left ventricular relaxation (grade 1 diastolic dysfunction). - Aortic valve: Trileaflet; moderately calcified leaflets. Sclerosis without stenosis. - Mitral valve: Mild regurgitation. - Left atrium: The atrium was mildly dilated. - Right ventricle: The cavity size was normal. Pacer wire or catheter noted in right ventricle. Systolic function was moderately reduced. - Tricuspid valve: Peak RV-RA gradient: 17mm Hg (S). - Pulmonary arteries: PA peak pressure: 32mm Hg (S). - Systemic veins: The IVC  measured 2.3 cm with minimal respirophasic variation, suggesting RA pressure 15 mmHg. Impressions: - Normal LV size with mild LV hypertrophy. EF 45% with basal to mid inferior and inferoseptal akinesis and posterior hypokinesis. The RV was normal in size but with moderate systolic dysfunction suggesting possible RV infarction. The IVC was dilated. There is a pacing wire coming up the IVC.   FOLLOW UP PLANS AND APPOINTMENTS Allergies  Allergen Reactions  . Penicillins Rash  . Sulfa Drugs Cross Reactors Rash     Medication List    STOP taking these medications       amLODipine 10 MG tablet  Commonly known as:  NORVASC      TAKE these medications       aspirin 81 MG tablet  Take 1 tablet (81 mg total) by mouth daily.     atorvastatin 80 MG tablet  Commonly known as:  LIPITOR  Take 1 tablet (80 mg total) by mouth daily.     carvedilol 3.125 MG tablet  Commonly known as:  COREG  Take 1 tablet (3.125 mg total) by mouth 2 (two) times daily with a meal.     clopidogrel 75 MG tablet  Commonly known as:  PLAVIX  Take 1 tablet (75 mg total) by mouth daily.     ferrous sulfate 325 (65 FE) MG tablet  Take 1 tablet (325 mg total) by mouth 2 (two) times daily with a meal.     hydrochlorothiazide 12.5 MG capsule  Commonly known as:  MICROZIDE  Take 12.5 mg by mouth daily.     timolol 0.5 % ophthalmic solution  Commonly known as:  TIMOPTIC  Place 1 drop into the left eye daily.     zolpidem 5 MG tablet  Commonly known as:  AMBIEN  Take 5 mg by mouth at bedtime.        Discharge Orders   Future Appointments Provider Department Dept Phone   10/30/2012 2:20 PM Beatrice Lecher, PA [facility] Main Office [place]) [phone]   Future Orders Complete By Expires     Diet - low sodium heart healthy  As directed     Increase activity slowly  As directed       Follow-up Information   Follow up with Tereso Newcomer, PA On 10/30/2012. (at 2:20 pm)    Contact information:    [address] [place] [address] [phone]       BRING ALL MEDICATIONS WITH YOU TO FOLLOW UP APPOINTMENTS  Time spent with patient to include physician time: 36 min Signed: Theodore Demark 10/23/2012, 3:34 PM Co-Sign MD

## 2012-10-23 NOTE — Progress Notes (Signed)
Connie Wagner to be D/C'd Home per MD order.  Discussed with the patient and all questions fully answered.    Medication List    STOP taking these medications       amLODipine 10 MG tablet  Commonly known as:  NORVASC      TAKE these medications       aspirin 81 MG tablet  Take 1 tablet (81 mg total) by mouth daily.     atorvastatin 80 MG tablet  Commonly known as:  LIPITOR  Take 1 tablet (80 mg total) by mouth daily.     carvedilol 3.125 MG tablet  Commonly known as:  COREG  Take 1 tablet (3.125 mg total) by mouth 2 (two) times daily with a meal.     clopidogrel 75 MG tablet  Commonly known as:  PLAVIX  Take 1 tablet (75 mg total) by mouth daily.     ferrous sulfate 325 (65 FE) MG tablet  Take 1 tablet (325 mg total) by mouth 2 (two) times daily with a meal.     hydrochlorothiazide 12.5 MG capsule  Commonly known as:  MICROZIDE  Take 12.5 mg by mouth daily.     timolol 0.5 % ophthalmic solution  Commonly known as:  TIMOPTIC  Place 1 drop into the left eye daily.     zolpidem 5 MG tablet  Commonly known as:  AMBIEN  Take 5 mg by mouth at bedtime.        VVS, Skin clean, dry and intact without evidence of skin break down, no evidence of skin tears noted. IV catheter discontinued intact. Site without signs and symptoms of complications. Dressing and pressure applied.  An After Visit Summary was printed and given to the patient. Patient escorted via WC, and D/C home (Assisted Living) via private auto with daughter.  Milta Deiters 10/23/2012 8:17 PM

## 2012-10-30 ENCOUNTER — Telehealth: Payer: Self-pay | Admitting: *Deleted

## 2012-10-30 ENCOUNTER — Ambulatory Visit (INDEPENDENT_AMBULATORY_CARE_PROVIDER_SITE_OTHER): Payer: Medicare Other | Admitting: Physician Assistant

## 2012-10-30 ENCOUNTER — Encounter: Payer: Self-pay | Admitting: Physician Assistant

## 2012-10-30 VITALS — BP 138/72 | HR 64 | Ht 60.0 in | Wt 129.8 lb

## 2012-10-30 DIAGNOSIS — I2510 Atherosclerotic heart disease of native coronary artery without angina pectoris: Secondary | ICD-10-CM

## 2012-10-30 DIAGNOSIS — D649 Anemia, unspecified: Secondary | ICD-10-CM

## 2012-10-30 DIAGNOSIS — E785 Hyperlipidemia, unspecified: Secondary | ICD-10-CM

## 2012-10-30 DIAGNOSIS — I6529 Occlusion and stenosis of unspecified carotid artery: Secondary | ICD-10-CM

## 2012-10-30 DIAGNOSIS — I10 Essential (primary) hypertension: Secondary | ICD-10-CM

## 2012-10-30 DIAGNOSIS — I2119 ST elevation (STEMI) myocardial infarction involving other coronary artery of inferior wall: Secondary | ICD-10-CM

## 2012-10-30 LAB — BASIC METABOLIC PANEL
BUN: 17 mg/dL (ref 6–23)
CO2: 23 mEq/L (ref 19–32)
Chloride: 104 mEq/L (ref 96–112)
Creatinine, Ser: 0.9 mg/dL (ref 0.4–1.2)
Glucose, Bld: 101 mg/dL — ABNORMAL HIGH (ref 70–99)
Potassium: 4.5 mEq/L (ref 3.5–5.1)

## 2012-10-30 LAB — CBC WITH DIFFERENTIAL/PLATELET
Basophils Absolute: 0.1 10*3/uL (ref 0.0–0.1)
Eosinophils Absolute: 0.3 10*3/uL (ref 0.0–0.7)
Eosinophils Relative: 4.9 % (ref 0.0–5.0)
HCT: 33.2 % — ABNORMAL LOW (ref 36.0–46.0)
Lymphs Abs: 1 10*3/uL (ref 0.7–4.0)
MCHC: 32.2 g/dL (ref 30.0–36.0)
MCV: 82.9 fl (ref 78.0–100.0)
Monocytes Absolute: 0.6 10*3/uL (ref 0.1–1.0)
Neutrophils Relative %: 65.1 % (ref 43.0–77.0)
Platelets: 324 10*3/uL (ref 150.0–400.0)
RDW: 18.8 % — ABNORMAL HIGH (ref 11.5–14.6)

## 2012-10-30 NOTE — Progress Notes (Signed)
[address] [place], [place]  [address] Phone: [phone], Fax:  [phone]  Date:  10/30/2012   ID:  Connie Wagner, DOB 26-Apr-1929, MRN [id number]  PCP:  Ginette Otto, MD  Primary Cardiologist:  Dr. Tonny Bollman     History of Present Illness: Connie Wagner is a 79 y.o. female who returns for follow up after a recent admission to the hospital for an inferior STEMI.  She has a hx of anemia, HTN. She was admitted 2/20-2/25 with an acute inferior STEMI after presenting to the ED via EMS with complaints of severe diarrhea. ECG demonstrated inferior ST elevation. Course was complicated by complete AV block requiring temporary pacing as well as cardiogenic shock requiring pressor support. Emergent LHC demonstrated high grade lesion in the mid RCA 99% treated with a Promus Premier DES.  Echocardiogram demonstrated an EF 45% and RV dysfunction concerning for RV infarct.  Creatinine was significantly elevated upon presentation (3.3). ACE inhibitor was not initiated. She was placed on beta blocker. Patient's Hgb did drop somewhat as well as iron levels and her iron was adjusted. She was to follow up with primary care. Creatinine improved to normal but ultimate decision was to avoid ACE inhibitor in the future given her presentation with acute renal failure.  She lives in [place].  She is doing well.  No chest pain.  Does note shoulder pain as her anginal equivalent and this has resolved.  No dyspnea.  No orthopnea, PND, edema.  No syncope.    Labs (2/14):  K 3.9, creatinine 3.3 => 0.73, ALT 56, LDL 60, Hgb 8.8 (MCV 85.9), TSH 0.522  Wt Readings from Last 3 Encounters:  10/30/12 129 lb 12.8 oz (58.877 kg)  10/23/12 136 lb 1.6 oz (61.735 kg)  10/23/12 136 lb 1.6 oz (61.735 kg)     Past Medical History  Diagnosis Date  . Anemia     iron deficient  . Upper GI bleeding 09/14/11    "have had it once before today"  . Migraines     "haven't had one since  going thru menopause"  . Depression   . Anxiety   . Rheumatoid arthritis   . Chronic lower back pain   . Hypertension   . Dementia     needs redirection freq.  . 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 CAD (coronary artery disease)     a. s/p Inf STEMI 2/14 => LHC 10/18/12: Diffuse distal LM 30%, LAD 50% calcified stenosis just after first septal perforator, diagonal branches with mild 20-30% stenosis, mid RCA 99%. PCI: Promus Premier DES to the mid RCA  . Ischemic cardiomyopathy     a. Echocardiogram 10/18/12: Mild LVH, inferior and inferoseptal AK, posterior HK, EF 45%, grade 1 diastolic dysfunction, aortic sclerosis without stenosis, mild MR, mild LAE, RV systolic function moderately reduced (pacer wire in right ventricle), PASP 32  . Carotid stenosis     a. [employer] screening done in 2009 with carotid stenosis noted    Current Outpatient Prescriptions  Medication Sig Dispense Refill  . aspirin 81 MG tablet Take 1 tablet (81 mg total) by mouth daily.  30 tablet    . atorvastatin (LIPITOR) 80 MG tablet Take 1 tablet (80 mg total) by mouth daily.  30 tablet  11  . carvedilol (COREG) 3.125 MG tablet Take 1 tablet (3.125 mg total) by mouth 2 (two) times daily with a meal.  60 tablet  11  . clopidogrel (PLAVIX) 75 MG tablet Take 1 tablet (75 mg total) by mouth daily.  30 tablet  11  . ferrous sulfate 325 (65 FE) MG tablet Take 1 tablet (325 mg total) by mouth 2 (two) times daily with a meal.  60 tablet  11  . hydrochlorothiazide (MICROZIDE) 12.5 MG capsule Take 12.5 mg by mouth daily.        Marland Kitchen HYDROcodone-acetaminophen (NORCO/VICODIN) 5-325 MG per tablet Take 1 tablet by mouth every 6 (six) hours as needed.       . timolol (TIMOPTIC) 0.5 % ophthalmic solution Place 1 drop into the left eye daily.      Marland Kitchen zolpidem (AMBIEN) 5 MG tablet Take 5 mg by mouth at bedtime.       No current facility-administered  medications for this visit.   Facility-Administered Medications Ordered in Other Visits  Medication Dose Route Frequency Kamarie Veno Last Rate Last Dose  . bupivacaine (MARCAINE) 0.5 % injection    PRN Kerby Nora, MD   20 mL at 08/17/12 1922    Allergies:    Allergies  Allergen Reactions  . Penicillins Rash  . Sulfa Drugs Cross Reactors Rash    Social History:  The patient  reports that she has never smoked. She has never used smokeless tobacco. She reports that she drinks about 3.0 ounces of alcohol per week. She reports that she does not use illicit drugs.   ROS:  Please see the history of present illness.     All other systems reviewed and negative.   PHYSICAL EXAM: VS:  BP 138/72  Pulse 64  Ht 5' (1.524 m)  Wt 129 lb 12.8 oz (58.877 kg)  BMI 25.35 kg/m2 Well nourished, well developed, in no acute distress HEENT: normal Neck: no JVD Vascular:  No carotid bruits Cardiac:  normal S1, S2; RRR; no murmur Lungs:  clear to auscultation bilaterally, no wheezing, rhonchi or rales Abd: soft, nontender, no hepatomegaly Ext: no edema; right groin without hematoma or bruit  Skin: warm and dry Neuro:  CNs 2-12 intact, no focal abnormalities noted  EKG:  NSR, HR 64, inf Q waves with inf TWI (evolving IMI)     ASSESSMENT AND PLAN:  1. Coronary Artery Disease:  Doing well after recent inferior STEMI c/b complete AV block and cardiogenic shock treated with DES to the RCA.  She is stable without angina.  She had mild to mod residual non-obstructive CAD elsewhere.  We discussed the importance of dual antiplatelet therapy.  She is not interested in cardiac rehab and plans to increase activity at her workout facility at [employer].   2. Acute Renal Failure:  Resolved.  Repeat BMET today.  We plan to hold off on starting an ACE. 3. Ischemic Cardiomyopathy:  Volume stable.  Tolerating Coreg.  As noted, we will not use an ACE.   4. Hyperlipidemia:  Check Lipids and LFTs in 6 weeks. 5. Anemia:   Repeat CBC today.  Follow up with primary care. 6. Carotid Stenosis:  She brings in a form annotating a hx of carotid stenosis on a prior [employer] screening test.  With her CAD, will get a formal set of carotid dopplers to rule out significant ICA stenosis.  7. Disposition:  Follow up with Dr. Tonny Bollman in weeks.   Signed, Tereso Newcomer, PA-C  3:12 PM 10/30/2012

## 2012-10-30 NOTE — Telephone Encounter (Signed)
lmptcb go over lab results 

## 2012-10-30 NOTE — Telephone Encounter (Signed)
Message copied by Tarri Fuller on Tue Oct 30, 2012  5:09 PM ------      Message from: [place], [place] T      Created: Tue Oct 30, 2012  4:55 PM       Potassium and kidney function look good.      Continue with current treatment plan.      Hgb stable.  Follow up with PCP as planned.       Fax labs to PCP.      Tereso Newcomer, PA-C  4:49 PM 07/12/2012 ------

## 2012-10-30 NOTE — Patient Instructions (Addendum)
NO MEDICATION CHANGES WERE MADE TODAY  LAB TODAY; BMET, CBC W/DIFF  FASTING LIPID AND LIVER PANEL TO BE DONE IN 6 WEEKS; NOTHING TO EAT OR DRINK AFTER MIDNIGHT THE NIGHT BEFORE  Your physician has requested that you have a carotid duplex DX 433.10; THIS CAN BE DONE THE SAME DAY AS THE FASTING LIPID AND LIVER PANEL. This test is an ultrasound of the carotid arteries in your neck. It looks at blood flow through these arteries that supply the brain with blood. Allow one hour for this exam. There are no restrictions or special instructions.  Your physician recommends that you schedule a follow-up appointment in: 6 WEEKS WITH DR. Excell Seltzer

## 2012-10-31 NOTE — Telephone Encounter (Signed)
pt notified about lab results with verbal understanding, I will fax results to PCP today

## 2012-11-13 ENCOUNTER — Encounter (INDEPENDENT_AMBULATORY_CARE_PROVIDER_SITE_OTHER): Payer: Medicare Other

## 2012-11-13 DIAGNOSIS — I6529 Occlusion and stenosis of unspecified carotid artery: Secondary | ICD-10-CM

## 2012-11-16 ENCOUNTER — Encounter: Payer: Self-pay | Admitting: Physician Assistant

## 2012-11-16 ENCOUNTER — Telehealth: Payer: Self-pay | Admitting: *Deleted

## 2012-11-16 NOTE — Telephone Encounter (Signed)
lmom bilateral plaque mild to moderate, no changes to be made at this time. repeat carotids in 1 yr.

## 2012-12-21 ENCOUNTER — Encounter: Payer: Self-pay | Admitting: Cardiovascular Disease

## 2012-12-21 ENCOUNTER — Other Ambulatory Visit: Payer: Medicare Other

## 2012-12-21 ENCOUNTER — Ambulatory Visit (INDEPENDENT_AMBULATORY_CARE_PROVIDER_SITE_OTHER): Payer: Medicare Other | Admitting: Cardiovascular Disease

## 2012-12-21 VITALS — BP 122/60 | HR 75 | Ht 62.0 in | Wt 132.0 lb

## 2012-12-21 DIAGNOSIS — I2510 Atherosclerotic heart disease of native coronary artery without angina pectoris: Secondary | ICD-10-CM

## 2012-12-21 DIAGNOSIS — E785 Hyperlipidemia, unspecified: Secondary | ICD-10-CM

## 2012-12-21 NOTE — Progress Notes (Signed)
HPI:  79 year old woman presenting for followup evaluation. The patient presented in February with an acute inferior wall MI complicated by complete heart block. She was treated with primary PCI of the right coronary artery using a drug-eluting stent. Her post MI left ventricular ejection fraction was 45% with inferior wall akinesis and RV dysfunction consistent with an RV infarct. She was not treated with an ACE inhibitor because of acute kidney injury at the time of her myocardial infarction.  The patient is doing quite well. She is pleased with her functional status and has no symptoms of chest pain, chest tightness, or shortness of breath. She denies orthopnea or PND. She's had no leg swelling, lightheadedness, or palpitations.  She really does not have any recollection of the details of her hospitalization. She didn't realize that she had a coronary stent in place. She complains of easy bruising and has noted extensive bruising on her legs. She takes Plavix alone and has not taken aspirin.  Outpatient Encounter Prescriptions as of 12/21/2012  Medication Sig Dispense Refill  . amLODipine (NORVASC) 10 MG tablet Take 10 mg by mouth daily.      Marland Kitchen atorvastatin (LIPITOR) 80 MG tablet Take 1 tablet (80 mg total) by mouth daily.  30 tablet  11  . carvedilol (COREG) 3.125 MG tablet Take 1 tablet (3.125 mg total) by mouth 2 (two) times daily with a meal.  60 tablet  11  . ferrous sulfate 325 (65 FE) MG tablet Take 1 tablet (325 mg total) by mouth 2 (two) times daily with a meal.  60 tablet  11  . hydrochlorothiazide (MICROZIDE) 12.5 MG capsule Take 12.5 mg by mouth daily.        Marland Kitchen HYDROcodone-acetaminophen (NORCO/VICODIN) 5-325 MG per tablet Take 1 tablet by mouth every 6 (six) hours as needed.       . timolol (TIMOPTIC) 0.5 % ophthalmic solution Place 1 drop into the left eye daily.      Marland Kitchen zolpidem (AMBIEN) 5 MG tablet Take 5 mg by mouth at bedtime.      . clopidogrel (PLAVIX) 75 MG tablet Take 1  tablet (75 mg total) by mouth daily.  30 tablet  11  . [DISCONTINUED] aspirin 81 MG tablet Take 1 tablet (81 mg total) by mouth daily.  30 tablet     Facility-Administered Encounter Medications as of 12/21/2012  Medication Dose Route Frequency Provider Last Rate Last Dose  . bupivacaine (MARCAINE) 0.5 % injection    PRN Kerby Nora, MD   20 mL at 08/17/12 1922    Allergies  Allergen Reactions  . Penicillins Rash  . Sulfa Drugs Cross Reactors Rash    Past Medical History  Diagnosis Date  . Anemia     iron deficient  . Upper GI bleeding 09/14/11    "have had it once before today"  . Migraines     "haven't had one since going thru menopause"  . Depression   . Anxiety   . Rheumatoid arthritis   . Chronic lower back pain   . Hypertension   . Dementia     needs redirection freq.  . 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 CAD (coronary artery disease)     a. s/p Inf STEMI 2/14 => LHC 10/18/12: Diffuse distal LM 30%, LAD 50% calcified stenosis just after first septal perforator, diagonal branches with mild 20-30% stenosis, mid RCA 99%. PCI: Promus Premier DES to the  mid RCA  . Ischemic cardiomyopathy     a. Echocardiogram 10/18/12: Mild LVH, inferior and inferoseptal AK, posterior HK, EF 45%, grade 1 diastolic dysfunction, aortic sclerosis without stenosis, mild MR, mild LAE, RV systolic function moderately reduced (pacer wire in right ventricle), PASP 32  . Carotid stenosis     a. Life[employer] screening done in 2009 with carotid stenosis noted;  b.  Carotid dopplers 3/14: R 0-39%, L 40-59%, repeat 1 year    ROS: Negative except as per HPI  BP 122/60  Pulse 75  Ht 5\' 2"  (1.575 m)  Wt 59.875 kg (132 lb)  BMI 24.14 kg/m2  SpO2 97%  PHYSICAL EXAM: Pt is alert and oriented, pleasant elderly woman in NAD HEENT: normal Neck: JVP - normal, carotids 2+= without bruits Lungs: CTA bilaterally CV: RRR without  murmur or gallop Abd: soft, NT, Positive BS, no hepatomegaly Ext: no C/C/E, distal pulses intact and equal  Echo 10/18/2012: Study Conclusions  - Left ventricle: The cavity size was normal. Wall thickness was increased in a pattern of mild LVH. The basal to mid inferior and inferoseptal walls were akinetic. The posterior wall was hypokinetic. The estimated ejection fraction was 45%. Doppler parameters are consistent with abnormal left ventricular relaxation (grade 1 diastolic dysfunction). - Aortic valve: Trileaflet; moderately calcified leaflets. Sclerosis without stenosis. - Mitral valve: Mild regurgitation. - Left atrium: The atrium was mildly dilated. - Right ventricle: The cavity size was normal. Pacer wire or catheter noted in right ventricle. Systolic function was moderately reduced. - Tricuspid valve: Peak RV-RA gradient: 17mm Hg (S). - Pulmonary arteries: PA peak pressure: 32mm Hg (S). - Systemic veins: The IVC measured 2.3 cm with minimal respirophasic variation, suggesting RA pressure 15 mmHg. Impressions:  - Normal LV size with mild LV hypertrophy. EF 45% with basal to mid inferior and inferoseptal akinesis and posterior hypokinesis. The RV was normal in size but with moderate systolic dysfunction suggesting possible RV infarction. The IVC was dilated. There is a pacing wire coming up the IVC.  ASSESSMENT AND PLAN: 1. Coronary artery disease, native vessel with history of inferior wall myocardial infarction February 2014. The patient is doing quite well. I recommended no changes in her medical program. She typically would be on a combination of low-dose aspirin and Plavix, but considering her significant bruising problems I'm comfortable leaving her on Plavix alone. After 12 months I will change her to aspirin 81 mg daily. She is on low-dose carvedilol and tolerating this well. Her left ventricular ejection fraction was 45% at the time of her myocardial infarction. An  ACE inhibitor was not started because of acute kidney injury and I think the potential risk probably outweighs the benefit so I did not start this today. She will remain on her current medical program and I'll see her back in followup in 6 months.  2. Hyperlipidemia. She will have repeat lipids and LFTs. She's on height intensity statin drug.  For followup of see her back in 6 months.  Tonny Bollman 12/21/2012 9:43 AM

## 2012-12-21 NOTE — Patient Instructions (Addendum)
Your physician recommends that you return for a FASTING LIPID and LIVER Profile--nothing to eat or drink after midnight, lab opens at 7:30 (12/25/12)  Your physician wants you to follow-up in: 6 MONTHS with Dr Excell Seltzer.  You will receive a reminder letter in the mail two months in advance. If you don't receive a letter, please call our office to schedule the follow-up appointment.  Your physician recommends that you continue on your current medications as directed. Please refer to the Current Medication list given to you today.

## 2012-12-25 ENCOUNTER — Other Ambulatory Visit: Payer: Medicare Other

## 2012-12-26 ENCOUNTER — Other Ambulatory Visit: Payer: Medicare Other

## 2013-01-02 ENCOUNTER — Other Ambulatory Visit (INDEPENDENT_AMBULATORY_CARE_PROVIDER_SITE_OTHER): Payer: Medicare Other

## 2013-01-02 DIAGNOSIS — E785 Hyperlipidemia, unspecified: Secondary | ICD-10-CM

## 2013-01-02 DIAGNOSIS — I2510 Atherosclerotic heart disease of native coronary artery without angina pectoris: Secondary | ICD-10-CM

## 2013-01-02 LAB — HEPATIC FUNCTION PANEL
AST: 21 U/L (ref 0–37)
Alkaline Phosphatase: 78 U/L (ref 39–117)
Bilirubin, Direct: 0 mg/dL (ref 0.0–0.3)
Total Protein: 7.6 g/dL (ref 6.0–8.3)

## 2013-01-02 LAB — LIPID PANEL
Cholesterol: 144 mg/dL (ref 0–200)
LDL Cholesterol: 55 mg/dL (ref 0–99)
Triglycerides: 87 mg/dL (ref 0.0–149.0)

## 2013-05-30 ENCOUNTER — Emergency Department (HOSPITAL_COMMUNITY)
Admission: EM | Admit: 2013-05-30 | Discharge: 2013-05-30 | Disposition: A | Discharge status: Home / Self Care | Payer: Medicare Other | Attending: Emergency Medicine | Admitting: Emergency Medicine

## 2013-05-30 ENCOUNTER — Encounter (HOSPITAL_COMMUNITY): Payer: Self-pay | Admitting: *Deleted

## 2013-05-30 DIAGNOSIS — Z862 Personal history of diseases of the blood and blood-forming organs and certain disorders involving the immune mechanism: Secondary | ICD-10-CM | POA: Insufficient documentation

## 2013-05-30 DIAGNOSIS — G8929 Other chronic pain: Secondary | ICD-10-CM | POA: Insufficient documentation

## 2013-05-30 DIAGNOSIS — K922 Gastrointestinal hemorrhage, unspecified: Secondary | ICD-10-CM | POA: Insufficient documentation

## 2013-05-30 DIAGNOSIS — Z880 Allergy status to penicillin: Secondary | ICD-10-CM | POA: Insufficient documentation

## 2013-05-30 DIAGNOSIS — Z79899 Other long term (current) drug therapy: Secondary | ICD-10-CM | POA: Insufficient documentation

## 2013-05-30 DIAGNOSIS — G43909 Migraine, unspecified, not intractable, without status migrainosus: Secondary | ICD-10-CM | POA: Insufficient documentation

## 2013-05-30 DIAGNOSIS — Z7902 Long term (current) use of antithrombotics/antiplatelets: Secondary | ICD-10-CM | POA: Insufficient documentation

## 2013-05-30 DIAGNOSIS — Z8781 Personal history of (healed) traumatic fracture: Secondary | ICD-10-CM | POA: Insufficient documentation

## 2013-05-30 DIAGNOSIS — I2510 Atherosclerotic heart disease of native coronary artery without angina pectoris: Secondary | ICD-10-CM | POA: Insufficient documentation

## 2013-05-30 DIAGNOSIS — I10 Essential (primary) hypertension: Secondary | ICD-10-CM | POA: Insufficient documentation

## 2013-05-30 DIAGNOSIS — R197 Diarrhea, unspecified: Secondary | ICD-10-CM | POA: Insufficient documentation

## 2013-05-30 DIAGNOSIS — Z8659 Personal history of other mental and behavioral disorders: Secondary | ICD-10-CM | POA: Insufficient documentation

## 2013-05-30 DIAGNOSIS — M069 Rheumatoid arthritis, unspecified: Secondary | ICD-10-CM | POA: Insufficient documentation

## 2013-05-30 DIAGNOSIS — F0390 Unspecified dementia without behavioral disturbance: Secondary | ICD-10-CM | POA: Insufficient documentation

## 2013-05-30 LAB — CBC WITH DIFFERENTIAL/PLATELET
Basophils Absolute: 0 10*3/uL (ref 0.0–0.1)
Basophils Relative: 0 % (ref 0–1)
Eosinophils Absolute: 0.1 10*3/uL (ref 0.0–0.7)
Hemoglobin: 14.3 g/dL (ref 12.0–15.0)
MCH: 30.3 pg (ref 26.0–34.0)
MCHC: 32.6 g/dL (ref 30.0–36.0)
Monocytes Absolute: 0.7 10*3/uL (ref 0.1–1.0)
Monocytes Relative: 10 % (ref 3–12)
Neutrophils Relative %: 81 % — ABNORMAL HIGH (ref 43–77)
RDW: 14 % (ref 11.5–15.5)

## 2013-05-30 LAB — COMPREHENSIVE METABOLIC PANEL
Albumin: 3.9 g/dL (ref 3.5–5.2)
BUN: 18 mg/dL (ref 6–23)
Creatinine, Ser: 1 mg/dL (ref 0.50–1.10)
Total Protein: 8 g/dL (ref 6.0–8.3)

## 2013-05-30 LAB — LIPASE, BLOOD: Lipase: 30 U/L (ref 11–59)

## 2013-05-30 MED ORDER — SODIUM CHLORIDE 0.9 % IV BOLUS (SEPSIS)
500.0000 mL | Freq: Once | INTRAVENOUS | Status: AC
  Administered 2013-05-30: 500 mL via INTRAVENOUS

## 2013-05-30 NOTE — ED Notes (Signed)
Pt reports having severe diarrhea x 3 days, today started having bright red blood in stools. Denies any vomiting.

## 2013-05-30 NOTE — ED Notes (Signed)
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

## 2013-05-30 NOTE — ED Provider Notes (Signed)
CSN: [id number]     Arrival date & time 05/30/13  1213 History   First MD Initiated Contact with Patient 05/30/13 1243     Chief Complaint  Patient presents with  . Diarrhea   (Consider location/radiation/quality/duration/timing/severity/associated sxs/prior Treatment) Patient is a 79 y.o. female presenting with diarrhea. The history is provided by the patient.  Diarrhea Associated symptoms: no abdominal pain, no headaches and no vomiting    patient presents with diarrhea for 4 days. She states she's been passing flatus stool with red blood. No lightheadedness dizziness. No other bleeding. No chest pain. No abdominal pain. No confusion. No other bleeding.  Past Medical History  Diagnosis Date  . Anemia     iron deficient  . Upper GI bleeding 09/14/11    "have had it once before today"  . Migraines     "haven't had one since going thru menopause"  . Depression   . Anxiety   . Rheumatoid arthritis(714.0)   . Chronic lower back pain   . Hypertension   . Dementia     needs redirection freq.  . 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 CAD (coronary artery disease)     a. s/p Inf STEMI 2/14 => LHC 10/18/12: Diffuse distal LM 30%, LAD 50% calcified stenosis just after first septal perforator, diagonal branches with mild 20-30% stenosis, mid RCA 99%. PCI: Promus Premier DES to the mid RCA  . Ischemic cardiomyopathy     a. Echocardiogram 10/18/12: Mild LVH, inferior and inferoseptal AK, posterior HK, EF 45%, grade 1 diastolic dysfunction, aortic sclerosis without stenosis, mild MR, mild LAE, RV systolic function moderately reduced (pacer wire in right ventricle), PASP 32  . Carotid stenosis     a. LifeLine screening done in 2009 with carotid stenosis noted;  b.  Carotid dopplers 3/14: R 0-39%, L 40-59%, repeat 1 year   Past Surgical History  Procedure Laterality Date  . Back surgery    . Joint replacement    .  Lumbar disc surgery  09/2002    Hemilaminectomy, L3, complete laminectomy, L4, hemi- semilaminectomy, L5 and right S1, and diskectomy, L5-S1.  Marland Kitchen Total knee arthroplasty  01/2002    right  . Incision and drainage of wound  10/2002    lumbar wound infection  . Knee arthroscopy  01/2001    right  . Hematoma evacuation  10/2002    lumbar  . Lumbar laminectomy  10/2003    for epidural abscess evacuation; L4-5  . Breast lumpectomy      "benign tumor removed"  . Esophagogastroduodenoscopy  09/15/2011    Procedure: ESOPHAGOGASTRODUODENOSCOPY (EGD);  Surgeon: Barrie Folk, MD;  Location[facility] ENDOSCOPY;  Service: Endoscopy;  Laterality: N/A;  . Orif humerus fracture  08/17/2012    Procedure: OPEN REDUCTION INTERNAL FIXATION (ORIF) DISTAL HUMERUS FRACTURE;  Surgeon: Eulas Post, MD;  Location: [facility] OR;  Service: Orthopedics;  Laterality: Right;  ORIF RIGHT SUPRACHONDULAR HUMERUS FRACTURE  . Harvest bone graft  08/17/2012    Procedure: HARVEST ILIAC BONE GRAFT;  Surgeon: Eulas Post, MD;  Location: [facility] OR;  Service: Orthopedics;  Laterality: Right;   Family History  Problem Relation Age of Onset  . Anesthesia problems Neg Hx   . Hypotension Neg Hx   . Malignant hyperthermia Neg Hx   . Pseudochol deficiency Neg Hx    History  Substance Use Topics  . Smoking status: Never Smoker   . Smokeless tobacco:  Never Used  . Alcohol Use: 3.0 oz/week    5 Glasses of wine per week   OB History   Grav Para Term Preterm Abortions TAB SAB Ect Mult Living                 Review of Systems  Constitutional: Negative for activity change and appetite change.  HENT: Negative for neck stiffness.   Eyes: Negative for pain.  Respiratory: Negative for chest tightness and shortness of breath.   Cardiovascular: Negative for chest pain and leg swelling.  Gastrointestinal: Positive for diarrhea and blood in stool. Negative for nausea, vomiting and abdominal pain.  Genitourinary: Negative for flank pain.   Musculoskeletal: Negative for back pain.  Skin: Negative for rash.  Neurological: Negative for weakness, numbness and headaches.  Psychiatric/Behavioral: Negative for behavioral problems.    Allergies  Iron; Penicillins; and Sulfa drugs cross reactors  Home Medications   Current Outpatient Rx  Name  Route  Sig  Dispense  Refill  . amLODipine (NORVASC) 10 MG tablet   Oral   Take 10 mg by mouth daily.         Marland Kitchen aspirin-acetaminophen-caffeine (EXCEDRIN MIGRAINE) 250-250-65 MG per tablet   Oral   Take 2 tablets by mouth every 6 (six) hours as needed for pain.         Marland Kitchen atorvastatin (LIPITOR) 80 MG tablet   Oral   Take 1 tablet (80 mg total) by mouth daily.   30 tablet   11   . carvedilol (COREG) 3.125 MG tablet   Oral   Take 1 tablet (3.125 mg total) by mouth 2 (two) times daily with a meal.   60 tablet   11   . clopidogrel (PLAVIX) 75 MG tablet   Oral   Take 1 tablet (75 mg total) by mouth daily.   30 tablet   11   . hydrochlorothiazide (MICROZIDE) 12.5 MG capsule   Oral   Take 12.5 mg by mouth daily.           Marland Kitchen OVER THE COUNTER MEDICATION   Oral   Take 2-4 tablets by mouth at bedtime. "sleep aid"         . timolol (TIMOPTIC) 0.5 % ophthalmic solution   Left Eye   Place 1 drop into the left eye daily.         Marland Kitchen zolpidem (AMBIEN) 5 MG tablet   Oral   Take 5 mg by mouth at bedtime.          BP 133/56  Pulse 105  Temp(Src) 99.1 F (37.3 C) (Oral)  Resp 18  SpO2 95% Physical Exam  Nursing note and vitals reviewed. Constitutional: She is oriented to person, place, and time. She appears well-developed and well-nourished.  HENT:  Head: Normocephalic and atraumatic.  Eyes: EOM are normal. Pupils are equal, round, and reactive to light.  Neck: Normal range of motion. Neck supple.  Cardiovascular: Normal rate, regular rhythm and normal heart sounds.   No murmur heard. Pulmonary/Chest: Effort normal and breath sounds normal. No respiratory  distress. She has no wheezes. She has no rales.  Abdominal: Soft. Bowel sounds are normal. She exhibits no distension. There is no tenderness. There is no rebound and no guarding.  Musculoskeletal: Normal range of motion.  Neurological: She is alert and oriented to person, place, and time. No cranial nerve deficit.  Skin: Skin is warm and dry.  Psychiatric: She has a normal mood and affect. Her speech is normal.  ED Course  Procedures (including critical care time) Labs Review Labs Reviewed  CBC WITH DIFFERENTIAL - Abnormal; Notable for the following:    Neutrophils Relative % 81 (*)    Lymphocytes Relative 7 (*)    Lymphs Abs 0.6 (*)    All other components within normal limits  COMPREHENSIVE METABOLIC PANEL - Abnormal; Notable for the following:    Sodium 131 (*)    Chloride 91 (*)    Glucose, Bld 124 (*)    GFR calc non Af Amer 51 (*)    GFR calc Af Amer 59 (*)    All other components within normal limits  LIPASE, BLOOD  URINALYSIS, ROUTINE W REFLEX MICROSCOPIC   Imaging Review No results found.  MDM   1. Diarrhea   2. GI bleed    patient with diarrhea for 4 days. Patient is well-appearing. His mild hyponatremia in mildly decreased chloride. Patient be given a fluid bolus. She also states she's had blood in stool. No blood on guaiac. She is not anemic and does not appear hypotensive. She'll followup with gastroenterology. She'll return as needed    Juliet Rude. Rubin Payor, MD 05/30/13 1413

## 2013-06-26 ENCOUNTER — Ambulatory Visit (INDEPENDENT_AMBULATORY_CARE_PROVIDER_SITE_OTHER): Payer: Medicare Other | Admitting: Cardiovascular Disease

## 2013-06-26 ENCOUNTER — Encounter: Payer: Self-pay | Admitting: Cardiovascular Disease

## 2013-06-26 VITALS — BP 130/70 | HR 78 | Ht 62.0 in | Wt 128.0 lb

## 2013-06-26 DIAGNOSIS — I10 Essential (primary) hypertension: Secondary | ICD-10-CM

## 2013-06-26 DIAGNOSIS — I2510 Atherosclerotic heart disease of native coronary artery without angina pectoris: Secondary | ICD-10-CM

## 2013-06-26 NOTE — Patient Instructions (Signed)
Your physician recommends that you continue on your current medications as directed. Please refer to the Current Medication list given to you today.  YOU CAN STOP PLAVIX 10/27/2013.  Your physician wants you to follow-up in: 6 MONTHS with Dr Excell Seltzer.  You will receive a reminder letter in the mail two months in advance. If you don't receive a letter, please call our office to schedule the follow-up appointment.  Please call Dr Pete Glatter for further evaluation of wrist.

## 2013-06-26 NOTE — Progress Notes (Signed)
HPI:  79 year old woman presenting for followup evaluation. The patient is followed for coronary artery disease and initially presented with an acute inferior wall MI complicated by complete heart block. She was treated with PCI of the right coronary artery. Her post MI ejection fraction was 45%. She had acute kidney injury at the time of her infarction and I have elected not to treat her with an ACE inhibitor because I feel the risk probably outweighs potential benefit. She's had no clinical signs of congestive heart failure. Most recent lipids were checked in May 2014 with a cholesterol of 144, triglycerides 87, HDL 72, and LDL 55.  From a cardiac perspective the patient is doing well. She remains active without exertional symptoms. She specifically denies chest pain, chest pressure, shortness of breath, palpitations, lightheadedness, or syncope.  She woke up yesterday with a swollen and painful left wrist. She denies any injury. She has no other joint pains. She denies fevers or chills. The wrist is painful to move and tender to the touch.  Outpatient Encounter Prescriptions as of 06/26/2013  Medication Sig Dispense Refill  . amLODipine (NORVASC) 10 MG tablet Take 10 mg by mouth daily.      Marland Kitchen aspirin-acetaminophen-caffeine (EXCEDRIN MIGRAINE) 250-250-65 MG per tablet Take 2 tablets by mouth every 6 (six) hours as needed for pain.      Marland Kitchen atorvastatin (LIPITOR) 80 MG tablet Take 1 tablet (80 mg total) by mouth daily.  30 tablet  11  . carvedilol (COREG) 3.125 MG tablet Take 1 tablet (3.125 mg total) by mouth 2 (two) times daily with a meal.  60 tablet  11  . clopidogrel (PLAVIX) 75 MG tablet Take 1 tablet (75 mg total) by mouth daily.  30 tablet  11  . hydrochlorothiazide (MICROZIDE) 12.5 MG capsule Take 12.5 mg by mouth daily.        Marland Kitchen OVER THE COUNTER MEDICATION Take 2-4 tablets by mouth at bedtime. "sleep aid"      . timolol (TIMOPTIC) 0.5 % ophthalmic solution Place 1 drop into the left eye  daily.      Marland Kitchen zolpidem (AMBIEN) 5 MG tablet Take 5 mg by mouth at bedtime.       Facility-Administered Encounter Medications as of 06/26/2013  Medication Dose Route Frequency Provider Last Rate Last Dose  . bupivacaine (MARCAINE) 0.5 % injection    PRN Kerby Nora, MD   20 mL at 08/17/12 1922    Allergies  Allergen Reactions  . Iron Nausea And Vomiting    Loose stool  . Penicillins Rash  . Sulfa Drugs Cross Reactors Rash    Past Medical History  Diagnosis Date  . Anemia     iron deficient  . Upper GI bleeding 09/14/11    "have had it once before today"  . Migraines     "haven't had one since going thru menopause"  . Depression   . Anxiety   . Rheumatoid arthritis(714.0)   . Chronic lower back pain   . Hypertension   . Dementia     needs redirection freq.  . 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 CAD (coronary artery disease)     a. s/p Inf STEMI 2/14 => LHC 10/18/12: Diffuse distal LM 30%, LAD 50% calcified stenosis just after first septal perforator, diagonal branches with mild 20-30% stenosis, mid RCA 99%. PCI: Promus Premier DES to the mid RCA  . Ischemic cardiomyopathy  a. Echocardiogram 10/18/12: Mild LVH, inferior and inferoseptal AK, posterior HK, EF 45%, grade 1 diastolic dysfunction, aortic sclerosis without stenosis, mild MR, mild LAE, RV systolic function moderately reduced (pacer wire in right ventricle), PASP 32  . Carotid stenosis     a. LifeLine screening done in 2009 with carotid stenosis noted;  b.  Carotid dopplers 3/14: R 0-39%, L 40-59%, repeat 1 year   ROS: Negative except as per HPI  BP 130/70  Pulse 78  Ht 5\' 2"  (1.575 m)  Wt 128 lb (58.06 kg)  BMI 23.41 kg/m2  PHYSICAL EXAM: Pt is alert and oriented, pleasant elderly woman in NAD HEENT: normal Neck: JVP - normal, carotids 2+= without bruits Lungs: CTA bilaterally CV: RRR without murmur or gallop Abd: soft,  NT, Positive BS, no hepatomegaly Ext: No leg edema, distal pulses intact and equal The left wrist is edematous and tender to touch. There is mild edema of the hand. There are 2 punctate lesions at the left wrist that raise suspicion for a bite.  EKG:  Normal sinus rhythm 78 beats per minute possible age-indeterminate inferior infarct.  ASSESSMENT AND PLAN: 1. Coronary atherosclerosis, native vessel status post inferior wall MI February 2014. The patient is stable without anginal symptoms. She has no symptoms of congestive heart failure. She will continue her current medical program and I have recommended that she stop Plavix at the end of February 2015 when she is out to 12 months from her MI. She will otherwise continue her current medications without changes.  2. Hyperlipidemia. She is tolerating a statin drug with lipids as detailed above.  3. Hypertension. Blood pressure is well controlled on a combination of amlodipine and carvedilol.  4. Left wrist swelling. Not sure if this is related to an insect bite versus joint infection or acute gout. I spoke with Dr Pete Glatter who will arrange to see her. I appreciate his help.  For follow-up I will see her back in 6 months. As above, she will stop plavix Feb 28th, 2015.  Tonny Bollman 06/26/2013 11:59 AM

## 2013-07-22 ENCOUNTER — Other Ambulatory Visit (HOSPITAL_COMMUNITY): Payer: Self-pay | Admitting: Geriatric Medicine

## 2013-07-22 DIAGNOSIS — Z1231 Encounter for screening mammogram for malignant neoplasm of breast: Secondary | ICD-10-CM

## 2013-08-01 ENCOUNTER — Emergency Department (HOSPITAL_COMMUNITY): Payer: Medicare Other

## 2013-08-01 ENCOUNTER — Emergency Department (HOSPITAL_COMMUNITY)
Admission: EM | Admit: 2013-08-01 | Discharge: 2013-08-01 | Disposition: A | Discharge status: Home / Self Care | Payer: Medicare Other | Attending: Emergency Medicine | Admitting: Emergency Medicine

## 2013-08-01 ENCOUNTER — Encounter (HOSPITAL_COMMUNITY): Payer: Self-pay | Admitting: Emergency Medicine

## 2013-08-01 DIAGNOSIS — S42033A Displaced fracture of lateral end of unspecified clavicle, initial encounter for closed fracture: Secondary | ICD-10-CM | POA: Insufficient documentation

## 2013-08-01 DIAGNOSIS — M069 Rheumatoid arthritis, unspecified: Secondary | ICD-10-CM | POA: Insufficient documentation

## 2013-08-01 DIAGNOSIS — M545 Low back pain, unspecified: Secondary | ICD-10-CM | POA: Insufficient documentation

## 2013-08-01 DIAGNOSIS — Z79899 Other long term (current) drug therapy: Secondary | ICD-10-CM | POA: Insufficient documentation

## 2013-08-01 DIAGNOSIS — W19XXXA Unspecified fall, initial encounter: Secondary | ICD-10-CM

## 2013-08-01 DIAGNOSIS — S42002A Fracture of unspecified part of left clavicle, initial encounter for closed fracture: Secondary | ICD-10-CM

## 2013-08-01 DIAGNOSIS — Z880 Allergy status to penicillin: Secondary | ICD-10-CM | POA: Insufficient documentation

## 2013-08-01 DIAGNOSIS — S8000XA Contusion of unspecified knee, initial encounter: Secondary | ICD-10-CM | POA: Insufficient documentation

## 2013-08-01 DIAGNOSIS — I2510 Atherosclerotic heart disease of native coronary artery without angina pectoris: Secondary | ICD-10-CM | POA: Insufficient documentation

## 2013-08-01 DIAGNOSIS — Y92009 Unspecified place in unspecified non-institutional (private) residence as the place of occurrence of the external cause: Secondary | ICD-10-CM | POA: Insufficient documentation

## 2013-08-01 DIAGNOSIS — Y9389 Activity, other specified: Secondary | ICD-10-CM | POA: Insufficient documentation

## 2013-08-01 DIAGNOSIS — Z96659 Presence of unspecified artificial knee joint: Secondary | ICD-10-CM | POA: Insufficient documentation

## 2013-08-01 DIAGNOSIS — F3289 Other specified depressive episodes: Secondary | ICD-10-CM | POA: Insufficient documentation

## 2013-08-01 DIAGNOSIS — G8929 Other chronic pain: Secondary | ICD-10-CM | POA: Insufficient documentation

## 2013-08-01 DIAGNOSIS — Z862 Personal history of diseases of the blood and blood-forming organs and certain disorders involving the immune mechanism: Secondary | ICD-10-CM | POA: Insufficient documentation

## 2013-08-01 DIAGNOSIS — W010XXA Fall on same level from slipping, tripping and stumbling without subsequent striking against object, initial encounter: Secondary | ICD-10-CM | POA: Insufficient documentation

## 2013-08-01 DIAGNOSIS — Z7902 Long term (current) use of antithrombotics/antiplatelets: Secondary | ICD-10-CM | POA: Insufficient documentation

## 2013-08-01 DIAGNOSIS — F0390 Unspecified dementia without behavioral disturbance: Secondary | ICD-10-CM | POA: Insufficient documentation

## 2013-08-01 DIAGNOSIS — F329 Major depressive disorder, single episode, unspecified: Secondary | ICD-10-CM | POA: Insufficient documentation

## 2013-08-01 DIAGNOSIS — S8001XA Contusion of right knee, initial encounter: Secondary | ICD-10-CM

## 2013-08-01 DIAGNOSIS — F411 Generalized anxiety disorder: Secondary | ICD-10-CM | POA: Insufficient documentation

## 2013-08-01 DIAGNOSIS — I10 Essential (primary) hypertension: Secondary | ICD-10-CM | POA: Insufficient documentation

## 2013-08-01 LAB — CBC WITH DIFFERENTIAL/PLATELET
Basophils Relative: 1 % (ref 0–1)
Eosinophils Absolute: 0.2 10*3/uL (ref 0.0–0.7)
Eosinophils Relative: 3 % (ref 0–5)
HCT: 34.8 % — ABNORMAL LOW (ref 36.0–46.0)
Hemoglobin: 11.5 g/dL — ABNORMAL LOW (ref 12.0–15.0)
Lymphocytes Relative: 12 % (ref 12–46)
Lymphs Abs: 0.8 10*3/uL (ref 0.7–4.0)
MCH: 30 pg (ref 26.0–34.0)
MCHC: 33 g/dL (ref 30.0–36.0)
MCV: 90.9 fL (ref 78.0–100.0)
Monocytes Absolute: 0.7 10*3/uL (ref 0.1–1.0)
Monocytes Relative: 11 % (ref 3–12)
Neutro Abs: 4.8 10*3/uL (ref 1.7–7.7)
Neutrophils Relative %: 74 % (ref 43–77)
RBC: 3.83 MIL/uL — ABNORMAL LOW (ref 3.87–5.11)

## 2013-08-01 LAB — URINALYSIS, ROUTINE W REFLEX MICROSCOPIC
Bilirubin Urine: NEGATIVE
Ketones, ur: NEGATIVE mg/dL
Nitrite: NEGATIVE
Protein, ur: NEGATIVE mg/dL
pH: 6 (ref 5.0–8.0)

## 2013-08-01 LAB — BASIC METABOLIC PANEL
BUN: 15 mg/dL (ref 6–23)
Chloride: 97 mEq/L (ref 96–112)
Creatinine, Ser: 0.92 mg/dL (ref 0.50–1.10)
GFR calc Af Amer: 65 mL/min — ABNORMAL LOW (ref >90)
GFR calc non Af Amer: 56 mL/min — ABNORMAL LOW (ref >90)
Glucose, Bld: 120 mg/dL — ABNORMAL HIGH (ref 70–99)

## 2013-08-01 LAB — URINE MICROSCOPIC-ADD ON

## 2013-08-01 MED ORDER — HYDROCODONE-ACETAMINOPHEN 5-325 MG PO TABS
1.0000 | ORAL_TABLET | Freq: Once | ORAL | Status: AC
  Administered 2013-08-01: 1 via ORAL
  Filled 2013-08-01: qty 1

## 2013-08-01 MED ORDER — TRAMADOL HCL 50 MG PO TABS: 50.0000 mg | ORAL_TABLET | Freq: Four times a day (QID) | ORAL | Status: DC | PRN

## 2013-08-01 NOTE — ED Provider Notes (Addendum)
CSN: [id number]     Arrival date & time 08/01/13  0654 History   First MD Initiated Contact with Patient 08/01/13 [phone]     Chief Complaint  Patient presents with  . Fall   (Consider location/radiation/quality/duration/timing/severity/associated sxs/prior Treatment) HPI Comments: Connie Wagner is a 79 y.o. Female who apparently slipped and fell in her bedroom. She is unable to give additional history.   Level V caveat: Dementia   The history is provided by the patient.    Past Medical History  Diagnosis Date  . Anemia     iron deficient  . Upper GI bleeding 09/14/11    "have had it once before today"  . Migraines     "haven't had one since going thru menopause"  . Depression   . Anxiety   . Rheumatoid arthritis(714.0)   . Chronic lower back pain   . Hypertension   . Dementia     needs redirection freq.  . 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 CAD (coronary artery disease)     a. s/p Inf STEMI 2/14 => LHC 10/18/12: Diffuse distal LM 30%, LAD 50% calcified stenosis just after first septal perforator, diagonal branches with mild 20-30% stenosis, mid RCA 99%. PCI: Promus Premier DES to the mid RCA  . Ischemic cardiomyopathy     a. Echocardiogram 10/18/12: Mild LVH, inferior and inferoseptal AK, posterior HK, EF 45%, grade 1 diastolic dysfunction, aortic sclerosis without stenosis, mild MR, mild LAE, RV systolic function moderately reduced (pacer wire in right ventricle), PASP 32  . Carotid stenosis     a. LifeLine screening done in 2009 with carotid stenosis noted;  b.  Carotid dopplers 3/14: R 0-39%, L 40-59%, repeat 1 year   Past Surgical History  Procedure Laterality Date  . Back surgery    . Joint replacement    . Lumbar disc surgery  09/2002    Hemilaminectomy, L3, complete laminectomy, L4, hemi- semilaminectomy, L5 and right S1, and diskectomy, L5-S1.  Marland Kitchen Total knee arthroplasty  01/2002     right  . Incision and drainage of wound  10/2002    lumbar wound infection  . Knee arthroscopy  01/2001    right  . Hematoma evacuation  10/2002    lumbar  . Lumbar laminectomy  10/2003    for epidural abscess evacuation; L4-5  . Breast lumpectomy      "benign tumor removed"  . Esophagogastroduodenoscopy  09/15/2011    Procedure: ESOPHAGOGASTRODUODENOSCOPY (EGD);  Surgeon: Barrie Folk, MD;  Location: [facility];  Service: Endoscopy;  Laterality: N/A;  . Orif humerus fracture  08/17/2012    Procedure: OPEN REDUCTION INTERNAL FIXATION (ORIF) DISTAL HUMERUS FRACTURE;  Surgeon: Eulas Post, MD;  Location: [facility] OR;  Service: Orthopedics;  Laterality: Right;  ORIF RIGHT SUPRACHONDULAR HUMERUS FRACTURE  . Harvest bone graft  08/17/2012    Procedure: HARVEST ILIAC BONE GRAFT;  Surgeon: Eulas Post, MD;  Location: [facility] OR;  Service: Orthopedics;  Laterality: Right;   Family History  Problem Relation Age of Onset  . Anesthesia problems Neg Hx   . Hypotension Neg Hx   . Malignant hyperthermia Neg Hx   . Pseudochol deficiency Neg Hx    History  Substance Use Topics  . Smoking status: Never Smoker   . Smokeless tobacco: Never Used  . Alcohol Use: 3.0 oz/week    5 Glasses of wine per week   OB History  Grav Para Term Preterm Abortions TAB SAB Ect Mult Living                 Review of Systems  Unable to perform ROS   Allergies  Iron; Penicillins; and Sulfa drugs cross reactors  Home Medications   Current Outpatient Rx  Name  Route  Sig  Dispense  Refill  . Acetaminophen (PAIN RELIEF PO)   Oral   Take 1 tablet by mouth 2 (two) times daily as needed (for pain).         Marland Kitchen amLODipine (NORVASC) 10 MG tablet   Oral   Take 10 mg by mouth daily.         Marland Kitchen atorvastatin (LIPITOR) 80 MG tablet   Oral   Take 1 tablet (80 mg total) by mouth daily.   30 tablet   11   . carvedilol (COREG) 3.125 MG tablet   Oral   Take 1 tablet (3.125 mg total) by mouth 2 (two) times daily  with a meal.   60 tablet   11   . clopidogrel (PLAVIX) 75 MG tablet   Oral   Take 1 tablet (75 mg total) by mouth daily.   30 tablet   11   . hydrochlorothiazide (MICROZIDE) 12.5 MG capsule   Oral   Take 12.5 mg by mouth daily.           . Multiple Vitamin (MULTIVITAMIN WITH MINERALS) TABS tablet   Oral   Take 1 tablet by mouth daily.         Marland Kitchen OVER THE COUNTER MEDICATION   Oral   Take 2-4 tablets by mouth at bedtime. "sleep aid"         . timolol (TIMOPTIC) 0.5 % ophthalmic solution   Left Eye   Place 1 drop into the left eye daily.         Marland Kitchen zolpidem (AMBIEN) 5 MG tablet   Oral   Take 5 mg by mouth at bedtime.         . traMADol (ULTRAM) 50 MG tablet   Oral   Take 1 tablet (50 mg total) by mouth every 6 (six) hours as needed.   15 tablet   0    BP 100/57  Pulse 96  Temp(Src) 98.2 F (36.8 C) (Oral)  Resp 16  Ht 5\' 2"  (1.575 m)  Wt 130 lb (58.968 kg)  BMI 23.77 kg/m2  SpO2 99% Physical Exam  Nursing note and vitals reviewed. Constitutional: She is oriented to person, place, and time. She appears well-developed.  Frail, elderly  HENT:  Head: Normocephalic and atraumatic.  Eyes: Conjunctivae and EOM are normal. Pupils are equal, round, and reactive to light.  Neck: Normal range of motion and phonation normal. Neck supple.  Cardiovascular: Normal rate, regular rhythm and intact distal pulses.   Pulmonary/Chest: Effort normal and breath sounds normal. She exhibits no tenderness.  Abdominal: Soft. She exhibits no distension. There is no tenderness. There is no guarding.  Musculoskeletal: Normal range of motion.  Bruising of left anterior and superior shoulder without associated swelling or deformity. Fairly normal range of motion of the shoulder. Tenderness and swelling of right knee. Increased pain on passive range of motion of the right knee  Neurological: She is alert and oriented to person, place, and time. She exhibits normal muscle tone.  Skin:  Skin is warm and dry.  Psychiatric: She has a normal mood and affect. Her behavior is normal.    ED Course  Procedures (including critical care time)   10:43 AM Reevaluation with update and discussion. After initial assessment and treatment, an updated evaluation reveals she remains alert, and does not have any further complaints. Her daughter is here now, Misty Stanley. She is the power of attorney. A knee immobilizer has been placed, but the patient has refused to stand up. She'll be treated with Norco, then another attempt will be made to ambulate. Her daughter feels that she is at her baseline, now. Her daughter, does not know the reason for the injury to the left shoulder. All findings are discussed with daughter. I will contact her PCP, to arrange a treatment plan. Preet Perrier L    10:45 AM-Consult complete with Dr Pete Glatter. Patient case explained and discussed. She has had this problem previously, 2 years ago, when she was drinking alcohol heavily. He agrees with initiation of evaluation in the home by home health for nursing assessment, and physical therapy. He agrees to see patient in office for further evaluation and treatment. Call ended at 11:15  11:30- Findings discussed with daughter . She understands that the patient may have a alcohol problem. Questions answered.   Labs Review Labs Reviewed  CBC WITH DIFFERENTIAL - Abnormal; Notable for the following:    RBC 3.83 (*)    Hemoglobin 11.5 (*)    HCT 34.8 (*)    RDW 16.4 (*)    All other components within normal limits  BASIC METABOLIC PANEL - Abnormal; Notable for the following:    Glucose, Bld 120 (*)    GFR calc non Af Amer 56 (*)    GFR calc Af Amer 65 (*)    All other components within normal limits  URINALYSIS, ROUTINE W REFLEX MICROSCOPIC - Abnormal; Notable for the following:    Hgb urine dipstick MODERATE (*)    Leukocytes, UA MODERATE (*)    All other components within normal limits  URINE CULTURE  URINE  MICROSCOPIC-ADD ON   Imaging Review Dg Chest 2 View  08/01/2013   CLINICAL DATA:  Fall  EXAM: CHEST  2 VIEW  COMPARISON:  10/20/2012  FINDINGS: Mild cardiac enlargement, stable vascular pattern normal. No infiltrate, consolidation, or effusion. Bony thorax intact except for changes in lateral left clavicle which appear new from prior study. There is a band of lucency in the lateral left clavicle with cortical irregularity. This could represent a fracture. There is no pneumothorax. A lower thoracic vertebral body compression deformity is stable from September 14, 2011.  IMPRESSION: 1. No acute cardiopulmonary process 2. Possible left clavicle fracture. Recommend radiographic imaging of the clavicle.   Electronically Signed   By: Esperanza Heir M.D.   On: 08/01/2013 08:33   Dg Shoulder Left  08/01/2013   CLINICAL DATA:  Left shoulder pain.  EXAM: LEFT SHOULDER - 2+ VIEW  COMPARISON:  None.  FINDINGS: Two views of the left shoulder reveal the bones to be mildly osteopenic. The glenohumeral joint is grossly normal. There are degenerative changes of the [facility] joint and there may be mild widening. There is irregular lucency through the distal aspect of the left clavicle that is worrisome for any minimally displaced fracture.  IMPRESSION: 1. No acute abnormality of the glenohumeral joint is demonstrated. 2. There is bony irregularity of the distal aspect of the left clavicle worrisome for a minimally displaced fracture. Associated degenerative change of the [facility] joint with mild widening is is present.   Electronically Signed   By: David  [place]   On: 08/01/2013 08:35  Dg Knee Complete 4 Views Right  08/01/2013   CLINICAL DATA:  Right knee pain.  EXAM: RIGHT KNEE - COMPLETE 4+ VIEW  COMPARISON:  None.  FINDINGS: There is a right total knee joint prosthesis in place. Radiographic positioning of the prosthetic components is good. The native bone appears mildly osteopenic. No acute fractures demonstrated. The interface  between the native bone and the prosthetic components is good. The proximal fibula appears intact. There may be mild soft tissue swelling over the anterior and medial aspects of the knee.  IMPRESSION: There is no acute abnormality of the native bone or of the right knee prosthesis. Mild soft tissue swelling anterior medially is suspected.   Electronically Signed   By: David  [place]   On: 08/01/2013 08:32    EKG Interpretation    Date/Time:  Thursday August 01 2013 07:07:01 EST Ventricular Rate:  89 PR Interval:  155 QRS Duration: 92 QT Interval:  393 QTC Calculation: 478 R Axis:   -22 Text Interpretation:  Sinus rhythm Atrial premature complex Probable left atrial enlargement RSR' in V1 or V2, probably normal variant Inferior infarct, old Since last tracing ST abnormality has resolved Confirmed by Effie Shy  MD, Rika Daughdrill ([id number]) on 08/01/2013 8:30:47 AM            MDM   1. Fall, initial encounter   2. Contusion of right knee, initial encounter   3. Clavicle fracture, left, closed, initial encounter      Acute and subacute falls. Apparently, knee contusion, and subacute left clavicle fracture. No clear source for fall, but there is a possibility of alcohol abuse contributing to falls. Patient currently lives independently, at a nursing care facility. Initiated home health evaluation. Daughter understands that the patient is to follow up with her PCP to continue that and complete that assessment. She is stable for discharge.   Nursing Notes Reviewed/ Care Coordinated, and agree without changes. Applicable Imaging Reviewed.  Interpretation of Laboratory Data incorporated into ED treatment   Plan: Home Medications- usual; Home Treatments and Observation- rest, avoid alcohol; return here if the recommended treatment, does not improve the symptoms; Recommended follow up- PCP, one week      Flint Melter, MD 08/01/13 1657  Flint Melter, MD 08/08/13 2004

## 2013-08-01 NOTE — ED Notes (Signed)
Attempted to ambulate patient. She is unable to apply weight on the right leg at all without severe pain. Patient is very nervous and fidgety. Daughter states she is not usually like this. Patient placed safely back in the stretcher and EDP notified.

## 2013-08-01 NOTE — ED Notes (Signed)
Discharge instructions gone over with and given to daughter at patients bedside.

## 2013-08-01 NOTE — ED Notes (Signed)
Per EMS, patient fell this morning when getting out of bed.  Patient reports slid on sock and landed on R knee.  Patient has history of R knee surgery.   Patient has large old bruising to L shoulder.   (no report of what happened).

## 2013-08-01 NOTE — Progress Notes (Signed)
   CARE MANAGEMENT ED NOTE 08/01/2013  Patient:  Connie Wagner, Connie Wagner   Account Number:  [id number]  Date Initiated:  08/01/2013  Documentation initiated by:  Edd Arbour  Subjective/Objective Assessment:   79 yr old female [employer] pt Dr Effie Shy requesting Home health RN/PT Called by Tedra Coupe of [facility] ED for assistance     Subjective/Objective Assessment Detail:   Pcp Hal stoneking Pt is at [place] Pt choice is the agency [facility]  Pt to be followed by [place] home health services (agency that provides services for [place] patients) for HHPT/RN as ordered in EPIC     Action/Plan:   CM spoke with [facility] ED RN x 2, Dr Effie Shy Requested ED RN to inquire of pt's choice of home health agencyNoted Orders listed in [facility] ED CM spoke with Juanda Bond [phone] to call in verbal referral   Action/Plan Detail:   CM signing off   Anticipated DC Date:  08/01/2013     Status Recommendation to Physician:   Result of Recommendation:    Other ED Services  Consult Working Plan    DC Planning Services  Other  Outpatient Services - Pt will follow up  CM consult   [facility] Choice  HOME HEALTH   Choice offered to / List presented to:       [facility] arranged  HH-1 RN  HH-2 PT      [facility] agency  [facility]    Status of service:  Completed, signed off  ED Comments:   ED Comments Detail:

## 2013-08-01 NOTE — ED Notes (Signed)
Daughter calling facility to put in place a caretaker for when the patient arrives home. [facility] being notified to transport patient home.

## 2013-08-01 NOTE — ED Notes (Signed)
EDP ordered Case Management for home PT. Spoke with case management and discussed with her what is needed to set this up so patient can be dischaged home. Daughter is at bedside. Patient is a resident at [facility] and the facility offers PT which the family would prefer to use them than another agency. Patient's daughter will set that up and if any problems will contact Dr. Pete Glatter for assistance.

## 2013-08-03 LAB — URINE CULTURE

## 2013-08-09 ENCOUNTER — Ambulatory Visit (HOSPITAL_COMMUNITY)
Admission: RE | Admit: 2013-08-09 | Discharge: 2013-08-09 | Disposition: A | Discharge status: Home / Self Care | Payer: Medicare Other | Source: Ambulatory Visit | Attending: Geriatric Medicine | Admitting: Geriatric Medicine

## 2013-08-09 ENCOUNTER — Other Ambulatory Visit (HOSPITAL_COMMUNITY): Payer: Self-pay | Admitting: Geriatric Medicine

## 2013-08-09 DIAGNOSIS — Z1231 Encounter for screening mammogram for malignant neoplasm of breast: Secondary | ICD-10-CM | POA: Insufficient documentation

## 2013-10-18 ENCOUNTER — Ambulatory Visit
Admission: RE | Admit: 2013-10-18 | Discharge: 2013-10-18 | Disposition: A | Discharge status: Home / Self Care | Payer: Medicare Other | Source: Ambulatory Visit | Attending: Geriatric Medicine | Admitting: Geriatric Medicine

## 2013-10-18 ENCOUNTER — Other Ambulatory Visit: Payer: Self-pay | Admitting: Geriatric Medicine

## 2013-10-18 DIAGNOSIS — R0781 Pleurodynia: Secondary | ICD-10-CM

## 2013-11-07 ENCOUNTER — Other Ambulatory Visit (HOSPITAL_COMMUNITY): Payer: Self-pay | Admitting: Physician Assistant

## 2013-11-07 ENCOUNTER — Other Ambulatory Visit (HOSPITAL_COMMUNITY): Payer: Self-pay | Admitting: Cardiovascular Disease

## 2013-12-04 ENCOUNTER — Other Ambulatory Visit (HOSPITAL_COMMUNITY): Payer: Self-pay | Admitting: *Deleted

## 2013-12-04 ENCOUNTER — Telehealth: Payer: Self-pay | Admitting: Cardiovascular Disease

## 2013-12-04 DIAGNOSIS — I6529 Occlusion and stenosis of unspecified carotid artery: Secondary | ICD-10-CM

## 2013-12-04 NOTE — Telephone Encounter (Signed)
Carotid dopplers have been scheduled for December 11, 2013 (pt has appt with Dr. Excell Seltzer same day)

## 2013-12-04 NOTE — Telephone Encounter (Signed)
New Message  Pt called. Requests a call back to discuss if the carotid is needed. Please calll

## 2013-12-04 NOTE — Telephone Encounter (Signed)
Pt is due for 1 year follow up. I spoke with [facility] and she will contact pt to schedule appt.

## 2013-12-11 ENCOUNTER — Ambulatory Visit: Payer: Medicare Other | Admitting: Cardiovascular Disease

## 2013-12-11 ENCOUNTER — Encounter (HOSPITAL_COMMUNITY): Payer: Medicare Other

## 2013-12-16 ENCOUNTER — Encounter: Payer: Self-pay | Admitting: Podiatry

## 2013-12-16 ENCOUNTER — Ambulatory Visit (INDEPENDENT_AMBULATORY_CARE_PROVIDER_SITE_OTHER): Payer: Medicare Other | Admitting: Podiatry

## 2013-12-16 VITALS — BP 142/86 | HR 92 | Resp 18

## 2013-12-16 DIAGNOSIS — M2040 Other hammer toe(s) (acquired), unspecified foot: Secondary | ICD-10-CM

## 2013-12-16 NOTE — Progress Notes (Signed)
   Subjective:    Patient ID: Connie Wagner, female    DOB: 04/17/1929, 80 y.o.   MRN: [id number]  HPI 2nd toe on my left foot and has been going on for maybe 6 months and there is some swelling and does hurt at times    Review of Systems  All other systems reviewed and are negative.      Objective:   Physical Exam Orientated x60 space 80 year old white female  Vascular: DP and PT pulses are trace palpable bilaterally  Neurological: Sensation to 10 g monofilament wire intact to 4/5 bilaterally. Ankle reflexes reactive bilaterally. Vibratory sensation intact bilaterally.  Dermatological: Some reactive fibrous tissue noted on the dorsal aspect of the second left toe  Musculoskeletal: Hammertoe deformity second bilaterally. Unsteady gait pattern       Assessment & Plan:   Assessment: Gait disturbance Hammertoe deformity second bilaterally  Plan: Patient advised to wear soft open shoe and a gel  Cap was dispensed wear over the second left toe.

## 2013-12-16 NOTE — Patient Instructions (Signed)
Wear the gelcap sleeve on the second left toe daily. Remove at night. Wear soft shoes over the toes.  Hammer Toes Hammer toes is a condition in which one or more of your toes is permanently flexed. CAUSES  This happens when a muscle imbalance or abnormal bone length makes your small toes buckle. This causes the toe joint to contract and the strong cord-like bands that attach muscles to the bones (tendons) in your toes to shorten.  SIGNS AND SYMPTOMS  Common symptoms of flexible hammer toes include:   A build up of skin cells (Corns). Corns occur where boney bumps come in frequent contact with hard surfaces. For example, where your shoes press and rub.  Irritation.  Inflammation.  Pain.  Limited motion in your toes DIAGNOSIS  Hammer toes are diagnosed through a physical exam of your toes.During the exam, your health care provider may try to reproduce your symptoms by manipulating your foot. Often, x-ray exams are done to determine the degree of deformity and to make sure that the cause is not a fracture.  TREATMENT  Hammer toes can be treated with corrective surgery. There are several types of surgical procedures that can treat hammer toes. The most common procedures include:  Arthroplasty A portion of the joint is surgically removed and your toe is straightened. The gap fills in with fibrous tissue. This procedure helps treat pain and deformity and helps restore function.  Fusion Cartilage between the two bones of the affected joint is taken out and the bones fuse together into one longer bone. This helps keep your toe stable and reduces pain but leaves your toe stiff, yet straight.  Implantation A portion of your bone is removed and replaced with an implant to restore motion.  Flexor tendon transfers This procedure repositions the tendons that curl the toes down (flexor tendons). This may be done to release the deforming force that causes your toe to buckle. Several of these  procedures require fixing your toe with a pin that is visible at the tip of your toe. The pin keeps the toe straight during healing. Your health care provider will remove the pin usually within 4 8 weeks after the procedure.  Document Released: 08/12/2000 Document Revised: 06/05/2013 Document Reviewed: 04/22/2013 [facility] Patient Information 2014 [place], [place].

## 2013-12-17 ENCOUNTER — Encounter: Payer: Self-pay | Admitting: Podiatry

## 2013-12-21 ENCOUNTER — Encounter: Payer: Self-pay | Admitting: *Deleted

## 2014-01-29 ENCOUNTER — Ambulatory Visit (INDEPENDENT_AMBULATORY_CARE_PROVIDER_SITE_OTHER): Payer: Medicare Other | Admitting: Cardiovascular Disease

## 2014-01-29 ENCOUNTER — Ambulatory Visit (HOSPITAL_COMMUNITY): Payer: Medicare Other | Attending: Cardiology | Admitting: *Deleted

## 2014-01-29 ENCOUNTER — Encounter: Payer: Self-pay | Admitting: Cardiovascular Disease

## 2014-01-29 VITALS — BP 184/96 | HR 88 | Ht 62.0 in | Wt 136.8 lb

## 2014-01-29 DIAGNOSIS — I10 Essential (primary) hypertension: Secondary | ICD-10-CM

## 2014-01-29 DIAGNOSIS — I2510 Atherosclerotic heart disease of native coronary artery without angina pectoris: Secondary | ICD-10-CM

## 2014-01-29 DIAGNOSIS — I6529 Occlusion and stenosis of unspecified carotid artery: Secondary | ICD-10-CM | POA: Insufficient documentation

## 2014-01-29 MED ORDER — HYDROCHLOROTHIAZIDE 12.5 MG PO CAPS: 12.5000 mg | ORAL_CAPSULE | Freq: Every day | ORAL | Status: DC

## 2014-01-29 MED ORDER — ASPIRIN EC 81 MG PO TBEC: 81.0000 mg | DELAYED_RELEASE_TABLET | Freq: Every day | ORAL | Status: DC

## 2014-01-29 MED ORDER — CARVEDILOL 6.25 MG PO TABS: ORAL_TABLET | ORAL | Status: DC

## 2014-01-29 NOTE — Patient Instructions (Signed)
Your physician has recommended you make the following change in your medication: START Aspirin 81mg  once a day, Restart HCTZ, You can stay off of plavix, INCREASE Carvedilol to 6.25mg  take one by mouth twice a day  Your physician recommends that you schedule a follow-up appointment in: 1 MONTH with PA/NP to follow-up on medication changes  Your physician wants you to follow-up in: 6 MONTHS with Dr .  You will receive a reminder letter in the mail two months in advance. If you don't receive a letter, please call our office to schedule the follow-up appointment.

## 2014-01-29 NOTE — Progress Notes (Signed)
HPI:  80 year old woman presenting for followup evaluation. The patient is followed for coronary artery disease with initial presentation of an inferior MI complicated by complete heart block and cardiogenic shock in February 2014.  The patient is doing remarkably well. She denies chest pain, shortness of breath, or leg swelling. She is enjoying time with her granddaughter. For some reason, she has discontinued many of her medicines. She is not even on aspirin any longer. She's off of several of her antihypertensive medications. She states that she's been feeling well so she just didn't think she needed the medications.  Outpatient Encounter Prescriptions as of 01/29/2014  Medication Sig  . atorvastatin (LIPITOR) 40 MG tablet Take 40 mg by mouth daily.  . carvedilol (COREG) 6.25 MG tablet TAKE 1 TABLET BY MOUTH TWICE DAILY WITH A MEAL.  . ferrous sulfate 325 (65 FE) MG tablet Take 325 mg by mouth 2 (two) times daily with a meal.  . zolpidem (AMBIEN) 5 MG tablet Take 5 mg by mouth at bedtime.  . [DISCONTINUED] carvedilol (COREG) 3.125 MG tablet TAKE 1 TABLET BY MOUTH TWICE DAILY WITH A MEAL.  Marland Kitchen aspirin EC 81 MG tablet Take 1 tablet (81 mg total) by mouth daily.  . hydrochlorothiazide (MICROZIDE) 12.5 MG capsule Take 1 capsule (12.5 mg total) by mouth daily.  . [DISCONTINUED] Acetaminophen (PAIN RELIEF PO) Take 1 tablet by mouth 2 (two) times daily as needed (for pain).  . [DISCONTINUED] amLODipine (NORVASC) 10 MG tablet Take 10 mg by mouth daily.  . [DISCONTINUED] atorvastatin (LIPITOR) 80 MG tablet Take 1 tablet (80 mg total) by mouth daily.  . [DISCONTINUED] clopidogrel (PLAVIX) 75 MG tablet Take 1 tablet (75 mg total) by mouth daily.  . [DISCONTINUED] hydrochlorothiazide (MICROZIDE) 12.5 MG capsule Take 12.5 mg by mouth daily.    . [DISCONTINUED] Multiple Vitamin (MULTIVITAMIN WITH MINERALS) TABS tablet Take 1 tablet by mouth daily.  . [DISCONTINUED] OVER THE COUNTER MEDICATION Take 2-4  tablets by mouth at bedtime. "sleep aid"  . [DISCONTINUED] timolol (TIMOPTIC) 0.5 % ophthalmic solution Place 1 drop into the left eye daily.  . [DISCONTINUED] traMADol (ULTRAM) 50 MG tablet Take 1 tablet (50 mg total) by mouth every 6 (six) hours as needed.    Allergies  Allergen Reactions  . Iron Nausea And Vomiting    Loose stool  . Penicillins Rash  . Sulfa Drugs Cross Reactors Rash    Past Medical History  Diagnosis Date  . Anemia     iron deficient  . Upper GI bleeding 09/14/11    "have had it once before today"  . Migraines     "haven't had one since going thru menopause"  . Depression   . Anxiety   . Rheumatoid arthritis(714.0)   . Chronic lower back pain   . Hypertension   . Dementia     needs redirection freq.  . 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 CAD (coronary artery disease)     a. s/p Inf STEMI 2/14 => LHC 10/18/12: Diffuse distal LM 30%, LAD 50% calcified stenosis just after first septal perforator, diagonal branches with mild 20-30% stenosis, mid RCA 99%. PCI: Promus Premier DES to the mid RCA  . Ischemic cardiomyopathy     a. Echocardiogram 10/18/12: Mild LVH, inferior and inferoseptal AK, posterior HK, EF 45%, grade 1 diastolic dysfunction, aortic sclerosis without stenosis, mild MR, mild LAE, RV systolic function moderately reduced (pacer wire in right  ventricle), PASP 32  . Carotid stenosis     a. LifeLine screening done in 2009 with carotid stenosis noted;  b.  Carotid dopplers 3/14: R 0-39%, L 40-59%, repeat 1 year    ROS: Negative except as per HPI  BP 184/96  Pulse 88  Ht 5\' 2"  (1.575 m)  Wt 62.052 kg (136 lb 12.8 oz)  BMI 25.01 kg/m2  PHYSICAL EXAM: Pt is alert and oriented, delightful elderly woman in NAD HEENT: normal Neck: JVP - normal, carotids 2+= without bruits Lungs: CTA bilaterally CV: RRR without murmur or gallop Abd: soft, NT, Positive BS, no  hepatomegaly Ext: no C/C/E, distal pulses intact and equal Skin: warm/dry no rash  EKG:  Sinus rhythm 88 beats per minute, PACs, age indeterminate inferior MI  ASSESSMENT AND PLAN: 1. Coronary artery disease, native vessel, with old myocardial infarction. The patient is stable without symptoms of angina or congestive heart failure. I reviewed her medication list and made the following recommendations:  Resume aspirin 81 mg daily  Increase carvedilol to 6.25 mg twice daily  Continue atorvastatin 40 mg daily  Resume hydrochlorothiazide 12.5 mg  I will see her back in 6 months for followup evaluation. I asked that she have a return visit with one of our advanced practice providers in one month to recheck her blood pressure and make further medication recommendations.  2. Hyperlipidemia. She continues on atorvastatin 40 mg daily  3. Hypertension. Blood pressure is markedly elevated. Reviewed the importance of medication adherence. Increase carvedilol and restarted hydrochlorothiazide today.  01/29/2014 4:34 PM

## 2014-01-29 NOTE — Progress Notes (Signed)
Carotid duplex complete 

## 2014-02-05 ENCOUNTER — Other Ambulatory Visit (HOSPITAL_COMMUNITY): Payer: Self-pay | Admitting: Physician Assistant

## 2014-02-26 ENCOUNTER — Ambulatory Visit: Payer: Medicare Other | Admitting: Physician Assistant

## 2014-03-20 ENCOUNTER — Ambulatory Visit: Payer: Medicare Other | Admitting: Physician Assistant

## 2014-03-30 ENCOUNTER — Other Ambulatory Visit (HOSPITAL_COMMUNITY): Payer: Self-pay | Admitting: Cardiovascular Disease

## 2014-04-09 ENCOUNTER — Encounter: Payer: Self-pay | Admitting: Physician Assistant

## 2014-04-09 ENCOUNTER — Ambulatory Visit (INDEPENDENT_AMBULATORY_CARE_PROVIDER_SITE_OTHER): Payer: Medicare Other | Admitting: Physician Assistant

## 2014-04-09 VITALS — BP 144/88 | HR 82 | Ht 62.0 in | Wt 137.0 lb

## 2014-04-09 DIAGNOSIS — I10 Essential (primary) hypertension: Secondary | ICD-10-CM

## 2014-04-09 DIAGNOSIS — E785 Hyperlipidemia, unspecified: Secondary | ICD-10-CM

## 2014-04-09 DIAGNOSIS — I2510 Atherosclerotic heart disease of native coronary artery without angina pectoris: Secondary | ICD-10-CM

## 2014-04-09 MED ORDER — HYDROCHLOROTHIAZIDE 12.5 MG PO CAPS: 12.5000 mg | ORAL_CAPSULE | Freq: Every day | ORAL | Status: DC

## 2014-04-09 MED ORDER — CARVEDILOL 6.25 MG PO TABS: ORAL_TABLET | ORAL | Status: DC

## 2014-04-09 MED ORDER — ATORVASTATIN CALCIUM 40 MG PO TABS: 40.0000 mg | ORAL_TABLET | Freq: Every day | ORAL | Status: DC

## 2014-04-09 NOTE — Patient Instructions (Addendum)
LAB WORK 8/121/15 @ 11:30; BMET  Your physician recommends that you schedule a follow-up appointment ON 05/12/14 @ 11:30 WITH [place], PAC

## 2014-04-09 NOTE — Progress Notes (Signed)
Cardiology Office Note    Date:  04/09/2014   ID:  Connie Wagner, DOB 1929-04-21, MRN [id number]  PCP:  Connie Otto, MD  Cardiologist:  Dr. Tonny Bollman      History of Present Illness: Connie Wagner is a 80 y.o. female with a hx of CAD s/p inf STEMI c/b CHB and cardiogenic shock in 09/2012.  She was treated with a DES to RCA, HTN, HL.  She was last seen by Dr. Tonny Bollman in 01/2014.  BP was markedly elevated.  She had DC many of her medications.  Medications were resumed and she returns for FU.  Patient presents today not taking any medications. She tells me that she was not aware that she should be taking aspirin, Lipitor, carvedilol, HCTZ. She now tells me that she is going to take these medications. Luckily, her blood pressure is not that significantly elevated today.   The patient denies any chest pain, significant dyspnea, syncope, orthopnea, PND, edema.   Studies:  - LHC (2/14):  Dist LM 30, LAD 50, Dx 20-30, mid RCA 99 >>> PCI:  DES to mid RCA  - Echo (2/14):  Mild LVH, inf and inf-septal AK, EF 45%, Gr 1 DD, Ao sclerosis, mild MR, mild LAE, mod reduced RV fxn, PASP 32 mmHg  - Carotid US (6/15):  L 40-59%, R 40-59% - FU 1 year   Recent Labs/Images: 05/30/2013: ALT 13  08/01/2013: Creatinine 0.92; Hemoglobin 11.5*; Potassium 3.9   Dg Ribs Unilateral W/chest Left  10/18/2013   CLINICAL DATA:  Left lateral chest pain radiating to the back. No known injury.  EXAM: LEFT RIBS AND CHEST - 3+ VIEW  COMPARISON:  DG CHEST 2 VIEW dated 08/01/2013; DG CHEST 1V PORT dated 10/20/2012  FINDINGS: The heart size and mediastinal contours are stable. There is aortic atherosclerosis. The lungs are clear. There is no pleural effusion or pneumothorax.  The bones are diffusely demineralized. The lower left ribs overlapping the heart and upper abdomen are suboptimally visualized, although no fracture or focal lytic lesion is identified. Several thoracolumbar compression deformities are  grossly stable.  IMPRESSION: Osteopenia with grossly stable thoracolumbar compression deformities. No acute rib fracture or focal rib lesion identified.   Electronically Signed   By: Roxy Horseman M.D.   On: 10/18/2013 10:33     Wt Readings from Last 3 Encounters:  04/09/14 137 lb (62.143 kg)  01/29/14 136 lb 12.8 oz (62.052 kg)  08/01/13 130 lb (58.968 kg)     Past Medical History  Diagnosis Date  . Anemia     iron deficient  . Upper GI bleeding 09/14/11    "have had it once before today"  . Migraines     "haven't had one since going thru menopause"  . Depression   . Anxiety   . Rheumatoid arthritis(714.0)   . Chronic lower back pain   . Hypertension   . Dementia     needs redirection freq.  . 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 CAD (coronary artery disease)     a. s/p Inf STEMI 2/14 => LHC 10/18/12: Diffuse distal LM 30%, LAD 50% calcified stenosis just after first septal perforator, diagonal branches with mild 20-30% stenosis, mid RCA 99%. PCI: Promus Premier DES to the mid RCA  . Ischemic cardiomyopathy     a. Echocardiogram 10/18/12: Mild LVH, inferior and inferoseptal AK, posterior HK, EF 45%, grade 1 diastolic dysfunction, aortic  sclerosis without stenosis, mild MR, mild LAE, RV systolic function moderately reduced (pacer wire in right ventricle), PASP 32  . Carotid stenosis     a. LifeLine screening done in 2009 with carotid stenosis noted;  b.  Carotid dopplers 3/14: R 0-39%, L 40-59%, repeat 1 year    Current Outpatient Prescriptions  Medication Sig Dispense Refill  . hydrochlorothiazide (MICROZIDE) 12.5 MG capsule Take 1 capsule (12.5 mg total) by mouth daily.  90 capsule  3  . zolpidem (AMBIEN) 5 MG tablet Take 5 mg by mouth at bedtime.      Marland Kitchen aspirin EC 81 MG tablet Take 1 tablet (81 mg total) by mouth daily.  1 tablet  0  . atorvastatin (LIPITOR) 40 MG tablet Take 40 mg by mouth daily.       . carvedilol (COREG) 6.25 MG tablet TAKE 1 TABLET BY MOUTH TWICE DAILY WITH A MEAL.  180 tablet  3  . ferrous sulfate 325 (65 FE) MG tablet TAKE 1 TABLET BY MOUTH TWICE DAILY WITH A MEAL.  60 tablet  1   No current facility-administered medications for this visit.   Facility-Administered Medications Ordered in Other Visits  Medication Dose Route Frequency Provider Last Rate Last Dose  . bupivacaine (MARCAINE) 0.5 % injection    PRN Kerby Nora, MD   20 mL at 08/17/12 1922     Allergies:   Iron; Penicillins; and Sulfa drugs cross reactors   Social History:  The patient  reports that she has never smoked. She has never used smokeless tobacco. She reports that she drinks about 3 ounces of alcohol per week. She reports that she does not use illicit drugs.   Family History:  The patient's family history includes Heart attack in her mother; Hypertension in her sister; Stroke in her mother. There is no history of Anesthesia problems, Hypotension, Malignant hyperthermia, or Pseudochol deficiency.   ROS:  Please see the history of present illness.      All other systems reviewed and negative.   PHYSICAL EXAM: VS:  BP 144/88  Pulse 82  Ht 5\' 2"  (1.575 m)  Wt 137 lb (62.143 kg)  BMI 25.05 kg/m2 Well nourished, well developed, in no acute distress HEENT: normal Neck: no JVD Cardiac:  normal S1, S2; RRR; no murmur Lungs:  clear to auscultation bilaterally, no wheezing, rhonchi or rales Abd: soft, nontender, no hepatomegaly Ext: no edema Skin: warm and dry Neuro:  CNs 2-12 intact, no focal abnormalities noted  EKG:  NSR, HR 82, normal axis, inferior Q waves, anterior Q waves, nonspecific ST-T wave changes, no change from prior tracing     ASSESSMENT AND PLAN:  Coronary artery disease:  Stable. No angina. Resume aspirin and statin. Question if she has an element of dementia. If she continues to have difficulty taking medications, she may need follow up with primary care or  neurology.  Unspecified essential hypertension:  Resume carvedilol and HCTZ as noted. Check basic metabolic panel in one week.  Other and unspecified hyperlipidemia:  Continue statin.   Disposition:  FU with me in 3-4 weeks.   Signed, , MHS 04/09/2014 12:14 PM    [facility] [address] [place], [place], [place]  [place] Phone: [phone]; Fax: [phone]

## 2014-04-18 ENCOUNTER — Other Ambulatory Visit (INDEPENDENT_AMBULATORY_CARE_PROVIDER_SITE_OTHER): Payer: Medicare Other

## 2014-04-18 DIAGNOSIS — I10 Essential (primary) hypertension: Secondary | ICD-10-CM

## 2014-04-19 LAB — BASIC METABOLIC PANEL
BUN: 20 mg/dL (ref 6–23)
CO2: 27 mEq/L (ref 19–32)
Calcium: 8.9 mg/dL (ref 8.4–10.5)
Chloride: 101 mEq/L (ref 96–112)
Creatinine, Ser: 1 mg/dL (ref 0.4–1.2)
GFR: 58.91 mL/min — ABNORMAL LOW (ref >60.00)
Glucose, Bld: 95 mg/dL (ref 70–99)
Potassium: 4.5 mEq/L (ref 3.5–5.1)
Sodium: 138 mEq/L (ref 135–145)

## 2014-05-12 ENCOUNTER — Ambulatory Visit: Payer: Medicare Other | Admitting: Physician Assistant

## 2014-07-15 ENCOUNTER — Other Ambulatory Visit (HOSPITAL_COMMUNITY): Payer: Self-pay | Admitting: Geriatric Medicine

## 2014-07-15 DIAGNOSIS — Z1231 Encounter for screening mammogram for malignant neoplasm of breast: Secondary | ICD-10-CM

## 2014-07-28 ENCOUNTER — Ambulatory Visit: Payer: Medicare Other | Admitting: Cardiovascular Disease

## 2014-08-07 ENCOUNTER — Encounter (HOSPITAL_COMMUNITY): Payer: Self-pay | Admitting: Cardiovascular Disease

## 2014-08-13 ENCOUNTER — Ambulatory Visit (HOSPITAL_COMMUNITY): Payer: Medicare Other | Attending: Geriatric Medicine

## 2014-09-11 ENCOUNTER — Ambulatory Visit (INDEPENDENT_AMBULATORY_CARE_PROVIDER_SITE_OTHER): Payer: Medicare Other | Admitting: Cardiovascular Disease

## 2014-09-11 ENCOUNTER — Encounter: Payer: Self-pay | Admitting: Cardiovascular Disease

## 2014-09-11 VITALS — BP 144/78 | HR 74 | Ht 62.0 in | Wt 135.8 lb

## 2014-09-11 DIAGNOSIS — I2510 Atherosclerotic heart disease of native coronary artery without angina pectoris: Secondary | ICD-10-CM

## 2014-09-11 NOTE — Patient Instructions (Signed)
Please take Aspirin 81mg  once a day.  Your physician wants you to follow-up in: 1 YEAR with Dr .  You will receive a reminder letter in the mail two months in advance. If you don't receive a letter, please call our office to schedule the follow-up appointment.

## 2014-09-11 NOTE — Progress Notes (Signed)
CARDIOLOGY OFFICE NOTE Date:  09/11/2014   ID:  Connie Wagner, DOB [birth year], MRN [id number]  PCP:  Ginette Otto, MD   Chief Complaint  Patient presents with  . Hypertension    no complaints  . Coronary Artery Disease  . Congestive Heart Failure     HISTORY OF PRESENT ILLNESS: Connie Wagner is a 81 y.o. female who presents today for cardiology evaluation.   The patient is followed for coronary artery disease with initial presentation of an inferior MI complicated by complete heart block and cardiogenic shock in February 2014.  She lives in [place] but is independent. She cooks breakfast and lunch and eats in the cafeteria for dinner. She no longer drives because she's uncomfortable dealing with traffic.    the patient states that she feels fine. She has no symptoms whatsoever. She specifically denies chest pain, shortness of breath, leg swelling, or heart palpitations. She has run out of all of her medicines again. She really is not going to take any medication. She states that she doesn't see the point of it at her age. Her husband passed away 6 years ago and she looks forward to being with him again. She is happy with her current quality of life and tells me she doesn't want to come to the doctor unless it's absolutely necessary.   Past Medical History  Diagnosis Date  . Anemia     iron deficient  . Upper GI bleeding 09/14/11    "have had it once before today"  . Migraines     "haven't had one since going thru menopause"  . Depression   . Anxiety   . Rheumatoid arthritis(714.0)   . Chronic lower back pain   . Hypertension   . Dementia     needs redirection freq.  . 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 CAD (coronary artery disease)     a. s/p Inf STEMI 2/14 => LHC 10/18/12: Diffuse distal LM 30%, LAD 50% calcified stenosis just after first septal perforator, diagonal branches  with mild 20-30% stenosis, mid RCA 99%. PCI: Promus Premier DES to the mid RCA  . Ischemic cardiomyopathy     a. Echocardiogram 10/18/12: Mild LVH, inferior and inferoseptal AK, posterior HK, EF 45%, grade 1 diastolic dysfunction, aortic sclerosis without stenosis, mild MR, mild LAE, RV systolic function moderately reduced (pacer wire in right ventricle), PASP 32  . Carotid stenosis     a. LifeLine screening done in 2009 with carotid stenosis noted;  b.  Carotid dopplers 3/14: R 0-39%, L 40-59%, repeat 1 year    Current Outpatient Prescriptions  Medication Sig Dispense Refill  . atorvastatin (LIPITOR) 40 MG tablet Take 1 tablet (40 mg total) by mouth at bedtime. 90 tablet 3  . carvedilol (COREG) 6.25 MG tablet TAKE 1 TABLET BY MOUTH TWICE DAILY WITH A MEAL. (Patient not taking: Reported on 09/11/2014) 180 tablet 3  . ferrous sulfate 325 (65 FE) MG tablet TAKE 1 TABLET BY MOUTH TWICE DAILY WITH A MEAL. (Patient not taking: Reported on 09/11/2014) 60 tablet 1  . hydrochlorothiazide (MICROZIDE) 12.5 MG capsule Take 1 capsule (12.5 mg total) by mouth daily. (Patient not taking: Reported on 09/11/2014) 90 capsule 3  . zolpidem (AMBIEN) 5 MG tablet Take 5 mg by mouth at bedtime.     No current facility-administered medications for this visit.   Facility-Administered Medications Ordered in Other Visits  Medication Dose Route Frequency  Provider Last Rate Last Dose  . bupivacaine (MARCAINE) 0.5 % injection    PRN Kerby Nora, MD   20 mL at 08/17/12 1922    ALLERGIES:   Iron; Penicillins; and Sulfa drugs cross reactors   SOCIAL HISTORY:  The patient  reports that she has never smoked. She has never used smokeless tobacco. She reports that she drinks about 3.0 oz of alcohol per week. She reports that she does not use illicit drugs.   FAMILY HISTORY:  The patient's family history includes Heart attack in her mother; Hypertension in her sister; Stroke in her mother. There is no history of Anesthesia  problems, Hypotension, Malignant hyperthermia, or Pseudochol deficiency.    REVIEW OF SYSTEMS:  Positive for no symptoms.   All other systems are reviewed and negative.   PHYSICAL EXAM: VS:  BP 144/78 mmHg  Pulse 74  Ht 5\' 2"  (1.575 m)  Wt 135 lb 12.8 oz (61.598 kg)  BMI 24.83 kg/m2  SpO2 96% , BMI Body mass index is 24.83 kg/(m^2). GEN: Well nourished, well developed, pleasant elderly woman in no acute distress HEENT: normal Neck: No JVD. carotids 2+ without bruits or masses Cardiac: The heart is RRR without murmurs, rubs, or gallops. No edema. Pedal pulses 2+ = bilaterally  Respiratory:  clear to auscultation bilaterally GI: soft, nontender, nondistended, + BS MS: no deformity or atrophy Skin: warm and dry, no rash Neuro:  Strength and sensation are intact Psych: euthymic mood, full affect  EKG:  EKG is not ordered today.  RECENT LABS: 04/18/2014: BUN 20; Creatinine 1.0; Potassium 4.5; Sodium 138  No results found for requested labs within last 365 days.   CrCl cannot be calculated (Patient has no serum creatinine result on file.).   Wt Readings from Last 3 Encounters:  09/11/14 135 lb 12.8 oz (61.598 kg)  04/09/14 137 lb (62.143 kg)  01/29/14 136 lb 12.8 oz (62.052 kg)     CARDIAC STUDIES:  none  ASSESSMENT AND PLAN: 1.  CAD with old MI.  I had a lengthy discussion with patient today about secondary risk reduction. It is clear now after multiple visits when she has come in off of all of her medicines, that she is not going to be compliant with medications. I asked her to just take aspirin 81 mg daily. She will not take any other medicines. I will see her back in one year.  2. Essential hypertension with borderline control. Again, she is not going to medication. Will continue to watch for now.  3. Hyperlipidemia. Lipids from 2014 reviewed. She has run out of her statin drug and does not want to resume anything. She will do her best with diet and lifestyle.  I will see  her back in one year for follow-up evaluation.  Labs/ tests ordered today include: none  Disposition:   FU with 12 months  03/31/14 09/11/2014 12:26 PM     [facility] [address] [place]  [phone] (office) [phone] (fax)

## 2014-11-17 ENCOUNTER — Encounter (HOSPITAL_COMMUNITY): Payer: Self-pay

## 2014-11-17 ENCOUNTER — Emergency Department (HOSPITAL_COMMUNITY): Payer: Medicare Other

## 2014-11-17 ENCOUNTER — Emergency Department (HOSPITAL_COMMUNITY)
Admission: EM | Admit: 2014-11-17 | Discharge: 2014-11-17 | Disposition: A | Discharge status: Home / Self Care | Payer: Medicare Other | Attending: Emergency Medicine | Admitting: Emergency Medicine

## 2014-11-17 DIAGNOSIS — Z8719 Personal history of other diseases of the digestive system: Secondary | ICD-10-CM | POA: Diagnosis not present

## 2014-11-17 DIAGNOSIS — I10 Essential (primary) hypertension: Secondary | ICD-10-CM | POA: Diagnosis not present

## 2014-11-17 DIAGNOSIS — W1830XA Fall on same level, unspecified, initial encounter: Secondary | ICD-10-CM | POA: Insufficient documentation

## 2014-11-17 DIAGNOSIS — Z8781 Personal history of (healed) traumatic fracture: Secondary | ICD-10-CM | POA: Diagnosis not present

## 2014-11-17 DIAGNOSIS — W19XXXA Unspecified fall, initial encounter: Secondary | ICD-10-CM

## 2014-11-17 DIAGNOSIS — Z7982 Long term (current) use of aspirin: Secondary | ICD-10-CM | POA: Insufficient documentation

## 2014-11-17 DIAGNOSIS — Y9389 Activity, other specified: Secondary | ICD-10-CM | POA: Diagnosis not present

## 2014-11-17 DIAGNOSIS — Z043 Encounter for examination and observation following other accident: Secondary | ICD-10-CM | POA: Diagnosis present

## 2014-11-17 DIAGNOSIS — I2510 Atherosclerotic heart disease of native coronary artery without angina pectoris: Secondary | ICD-10-CM | POA: Insufficient documentation

## 2014-11-17 DIAGNOSIS — Y998 Other external cause status: Secondary | ICD-10-CM | POA: Diagnosis not present

## 2014-11-17 DIAGNOSIS — G8929 Other chronic pain: Secondary | ICD-10-CM | POA: Diagnosis not present

## 2014-11-17 DIAGNOSIS — Z880 Allergy status to penicillin: Secondary | ICD-10-CM | POA: Diagnosis not present

## 2014-11-17 DIAGNOSIS — M439 Deforming dorsopathy, unspecified: Secondary | ICD-10-CM

## 2014-11-17 DIAGNOSIS — Z9889 Other specified postprocedural states: Secondary | ICD-10-CM | POA: Insufficient documentation

## 2014-11-17 DIAGNOSIS — Y9289 Other specified places as the place of occurrence of the external cause: Secondary | ICD-10-CM | POA: Diagnosis not present

## 2014-11-17 DIAGNOSIS — F0390 Unspecified dementia without behavioral disturbance: Secondary | ICD-10-CM | POA: Diagnosis not present

## 2014-11-17 DIAGNOSIS — Z862 Personal history of diseases of the blood and blood-forming organs and certain disorders involving the immune mechanism: Secondary | ICD-10-CM | POA: Diagnosis not present

## 2014-11-17 DIAGNOSIS — Z8659 Personal history of other mental and behavioral disorders: Secondary | ICD-10-CM | POA: Diagnosis not present

## 2014-11-17 DIAGNOSIS — M5383 Other specified dorsopathies, cervicothoracic region: Secondary | ICD-10-CM | POA: Diagnosis not present

## 2014-11-17 DIAGNOSIS — S0081XA Abrasion of other part of head, initial encounter: Secondary | ICD-10-CM | POA: Insufficient documentation

## 2014-11-17 LAB — CBC WITH DIFFERENTIAL/PLATELET
Basophils Absolute: 0 10*3/uL (ref 0.0–0.1)
Basophils Relative: 0 % (ref 0–1)
Eosinophils Absolute: 0.1 10*3/uL (ref 0.0–0.7)
Eosinophils Relative: 1 % (ref 0–5)
HEMATOCRIT: 44.9 % (ref 36.0–46.0)
Hemoglobin: 14.3 g/dL (ref 12.0–15.0)
LYMPHS PCT: 6 % — AB (ref 12–46)
Lymphs Abs: 0.5 10*3/uL — ABNORMAL LOW (ref 0.7–4.0)
MCH: 31.3 pg (ref 26.0–34.0)
MCHC: 31.8 g/dL (ref 30.0–36.0)
MCV: 98.2 fL (ref 78.0–100.0)
MONO ABS: 0.9 10*3/uL (ref 0.1–1.0)
MONOS PCT: 11 % (ref 3–12)
Neutro Abs: 6.8 10*3/uL (ref 1.7–7.7)
Neutrophils Relative %: 82 % — ABNORMAL HIGH (ref 43–77)
Platelets: 189 10*3/uL (ref 150–400)
RBC: 4.57 MIL/uL (ref 3.87–5.11)
RDW: 15.3 % (ref 11.5–15.5)
WBC: 8.2 10*3/uL (ref 4.0–10.5)

## 2014-11-17 LAB — URINE MICROSCOPIC-ADD ON

## 2014-11-17 LAB — BASIC METABOLIC PANEL
ANION GAP: 16 — AB (ref 5–15)
BUN: 15 mg/dL (ref 6–23)
CO2: 21 mmol/L (ref 19–32)
CREATININE: 0.77 mg/dL (ref 0.50–1.10)
Calcium: 9.1 mg/dL (ref 8.4–10.5)
Chloride: 104 mmol/L (ref 96–112)
GFR calc non Af Amer: 75 mL/min — ABNORMAL LOW (ref >90)
GFR, EST AFRICAN AMERICAN: 87 mL/min — AB (ref >90)
Glucose, Bld: 68 mg/dL — ABNORMAL LOW (ref 70–99)
POTASSIUM: 4.3 mmol/L (ref 3.5–5.1)
Sodium: 141 mmol/L (ref 135–145)

## 2014-11-17 LAB — URINALYSIS, ROUTINE W REFLEX MICROSCOPIC
Bilirubin Urine: NEGATIVE
GLUCOSE, UA: NEGATIVE mg/dL
KETONES UR: NEGATIVE mg/dL
LEUKOCYTES UA: NEGATIVE
Nitrite: NEGATIVE
PH: 5 (ref 5.0–8.0)
Protein, ur: NEGATIVE mg/dL
Specific Gravity, Urine: 1.013 (ref 1.005–1.030)
Urobilinogen, UA: 0.2 mg/dL (ref 0.0–1.0)

## 2014-11-17 LAB — CK: Total CK: 86 U/L (ref 7–177)

## 2014-11-17 MED ORDER — TRAMADOL HCL 50 MG PO TABS: 50.0000 mg | ORAL_TABLET | Freq: Four times a day (QID) | ORAL | Status: DC | PRN

## 2014-11-17 MED ORDER — TRAMADOL HCL 50 MG PO TABS
50.0000 mg | ORAL_TABLET | Freq: Once | ORAL | Status: AC
Start: 2014-11-17 — End: 2014-11-17
  Administered 2014-11-17: 50 mg via ORAL
  Filled 2014-11-17: qty 1

## 2014-11-17 MED ORDER — ACETAMINOPHEN 325 MG PO TABS
650.0000 mg | ORAL_TABLET | Freq: Once | ORAL | Status: AC
  Administered 2014-11-17: 650 mg via ORAL
  Filled 2014-11-17: qty 2

## 2014-11-17 MED ORDER — SODIUM CHLORIDE 0.9 % IV BOLUS (SEPSIS)
500.0000 mL | Freq: Once | INTRAVENOUS | Status: AC
  Administered 2014-11-17: 500 mL via INTRAVENOUS

## 2014-11-17 NOTE — ED Notes (Signed)
Bed: WA06 Expected date:  Expected time:  Means of arrival:  Comments: Ems- 81 yo F, found laying on floor,immobilized

## 2014-11-17 NOTE — Discharge Instructions (Signed)

## 2014-11-17 NOTE — ED Provider Notes (Signed)
CSN: [id number]     Arrival date & time 11/17/14  1024 History   First MD Initiated Contact with Patient 11/17/14 1051     Chief Complaint  Patient presents with  . Fall     (Consider location/radiation/quality/duration/timing/severity/associated sxs/prior Treatment) Patient is a 81 y.o. female presenting with fall. The history is provided by the patient and the EMS personnel.  Fall This is a new problem. Episode onset: within last 12 hrs. Episode frequency: once. The problem has been resolved. Pertinent negatives include no chest pain, no abdominal pain, no headaches and no shortness of breath. Nothing aggravates the symptoms. Nothing relieves the symptoms. She has tried nothing for the symptoms. The treatment provided no relief.    Past Medical History  Diagnosis Date  . Anemia     iron deficient  . Upper GI bleeding 09/14/11    "have had it once before today"  . Migraines     "haven't had one since going thru menopause"  . Depression   . Anxiety   . Rheumatoid arthritis(714.0)   . Chronic lower back pain   . Hypertension   . Dementia     needs redirection freq.  . 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 CAD (coronary artery disease)     a. s/p Inf STEMI 2/14 => LHC 10/18/12: Diffuse distal LM 30%, LAD 50% calcified stenosis just after first septal perforator, diagonal branches with mild 20-30% stenosis, mid RCA 99%. PCI: Promus Premier DES to the mid RCA  . Ischemic cardiomyopathy     a. Echocardiogram 10/18/12: Mild LVH, inferior and inferoseptal AK, posterior HK, EF 45%, grade 1 diastolic dysfunction, aortic sclerosis without stenosis, mild MR, mild LAE, RV systolic function moderately reduced (pacer wire in right ventricle), PASP 32  . Carotid stenosis     a. [employer] screening done in 2009 with carotid stenosis noted;  b.  Carotid dopplers 3/14: R 0-39%, L 40-59%, repeat 1 year   Past Surgical  History  Procedure Laterality Date  . Back surgery    . Joint replacement    . Lumbar disc surgery  09/2002    Hemilaminectomy, L3, complete laminectomy, L4, hemi- semilaminectomy, L5 and right S1, and diskectomy, L5-S1.  Marland Kitchen Total knee arthroplasty  01/2002    right  . Incision and drainage of wound  10/2002    lumbar wound infection  . Knee arthroscopy  01/2001    right  . Hematoma evacuation  10/2002    lumbar  . Lumbar laminectomy  10/2003    for epidural abscess evacuation; L4-5  . Breast lumpectomy      "benign tumor removed"  . Esophagogastroduodenoscopy  09/15/2011    Procedure: ESOPHAGOGASTRODUODENOSCOPY (EGD);  Surgeon: Barrie Folk, MD;  Location: [facility] ENDOSCOPY;  Service: Endoscopy;  Laterality: N/A;  . Orif humerus fracture  08/17/2012    Procedure: OPEN REDUCTION INTERNAL FIXATION (ORIF) DISTAL HUMERUS FRACTURE;  Surgeon: Eulas Post, MD;  Location: [facility] OR;  Service: Orthopedics;  Laterality: Right;  ORIF RIGHT SUPRACHONDULAR HUMERUS FRACTURE  . Harvest bone graft  08/17/2012    Procedure: HARVEST ILIAC BONE GRAFT;  Surgeon: Eulas Post, MD;  Location: [facility] OR;  Service: Orthopedics;  Laterality: Right;  . Left heart catheterization with coronary angiogram N/A 10/18/2012    Procedure: LEFT HEART CATHETERIZATION WITH CORONARY ANGIOGRAM;  Surgeon: Tonny Bollman, MD;  Location: [facility] CATH LAB;  Service: Cardiovascular;  Laterality: N/A;   Family  History  Problem Relation Age of Onset  . Anesthesia problems Neg Hx   . Hypotension Neg Hx   . Malignant hyperthermia Neg Hx   . Pseudochol deficiency Neg Hx   . Hypertension Sister   . Heart attack Mother   . Stroke Mother    History  Substance Use Topics  . Smoking status: Never Smoker   . Smokeless tobacco: Never Used  . Alcohol Use: 3.0 oz/week    5 Glasses of wine per week   OB History    No data available     Review of Systems  Constitutional: Negative for fever and fatigue.  HENT: Negative for congestion and  drooling.   Eyes: Negative for pain.  Respiratory: Negative for cough and shortness of breath.   Cardiovascular: Negative for chest pain.  Gastrointestinal: Negative for nausea, vomiting, abdominal pain and diarrhea.  Genitourinary: Negative for dysuria and hematuria.  Musculoskeletal: Negative for back pain, gait problem and neck pain.  Skin: Negative for color change.  Neurological: Negative for dizziness and headaches.  Hematological: Negative for adenopathy.  Psychiatric/Behavioral: Negative for behavioral problems.  All other systems reviewed and are negative.     Allergies  Iron; Penicillins; and Sulfa drugs cross reactors  Home Medications   Prior to Admission medications   Medication Sig Start Date End Date Taking? Authorizing Provider  aspirin EC 81 MG tablet Take 81 mg by mouth daily.    Historical Provider, MD   BP 146/66 mmHg  Pulse 94  Temp(Src) 97.8 F (36.6 C) (Oral)  Resp 16  SpO2 96% Physical Exam  Constitutional: She appears well-developed and well-nourished.  HENT:  Mouth/Throat: Oropharynx is clear and moist. No oropharyngeal exudate.  Mild abrasion between the eyebrows.  Eyes: Conjunctivae and EOM are normal. Pupils are equal, round, and reactive to light.  Neck: Normal range of motion. Neck supple.  Cardiovascular: Normal rate, regular rhythm, normal heart sounds and intact distal pulses.  Exam reveals no gallop and no friction rub.   No murmur heard. Pulmonary/Chest: Effort normal and breath sounds normal. No respiratory distress. She has no wheezes.  Abdominal: Soft. Bowel sounds are normal. There is no tenderness. There is no rebound and no guarding.  Musculoskeletal: Normal range of motion. She exhibits no edema or tenderness.  Nonspecific vertebral tenderness in the cervical thoracic and lumbar regions.  Normal strength in all extremities. Normal range of motion of the hips bilaterally without pain.  No focal tenderness of ankles on my exam.   Neurological: She is alert.  alert, oriented x 2, does not know year speech: normal in context and clarity memory: intact grossly cranial nerves II-XII: intact motor strength: full proximally and distally sensation: intact to light touch diffusely  cerebellar: finger-to-nose and heel-to-shin intact Gait: ambulates w/out difficulty  Skin: Skin is warm and dry.  Psychiatric: She has a normal mood and affect. Her behavior is normal.  Nursing note and vitals reviewed.   ED Course  Procedures (including critical care time) Labs Review Labs Reviewed  CBC WITH DIFFERENTIAL/PLATELET - Abnormal; Notable for the following:    Neutrophils Relative % 82 (*)    Lymphocytes Relative 6 (*)    Lymphs Abs 0.5 (*)    All other components within normal limits  BASIC METABOLIC PANEL - Abnormal; Notable for the following:    Glucose, Bld 68 (*)    GFR calc non Af Amer 75 (*)    GFR calc Af Amer 87 (*)    Anion gap  16 (*)    All other components within normal limits  URINALYSIS, ROUTINE W REFLEX MICROSCOPIC - Abnormal; Notable for the following:    Hgb urine dipstick MODERATE (*)    All other components within normal limits  URINE MICROSCOPIC-ADD ON - Abnormal; Notable for the following:    Casts HYALINE CASTS (*)    All other components within normal limits  CK    Imaging Review Dg Chest 2 View  11/17/2014   CLINICAL DATA:  Pain following unwitnessed fall  EXAM: CHEST  2 VIEW  COMPARISON:  Chest radiograph October 18, 2013; thoracic spine series obtained earlier in the day  FINDINGS: Multiple thoracic vertebral body wedge compression fractures noted, described in the thoracic spine report. Lungs are clear. Heart is upper normal in size with pulmonary vascularity within normal limits. No pneumothorax. No adenopathy. There is extensive coronary artery calcification.  IMPRESSION: Lungs clear. Extensive coronary artery calcification. Multiple wedge compression fractures in the thoracic spine.    Electronically Signed   By: Bretta Bang III M.D.   On: 11/17/2014 11:53   Dg Thoracic Spine W/swimmers  11/17/2014   CLINICAL DATA:  Dorsalgia.  Unwitnessed fall  EXAM: THORACIC SPINE - 2 VIEW + SWIMMERS  COMPARISON:  Chest radiograph August 01, 2013  FINDINGS: Frontal, lateral, and swimmer's views were obtained. There is lower thoracic levoscoliosis. There are stable anterior wedge compression fractures at T9, T10, and T12. There is increase in anterior wedging at T7 compared to the prior study. There is stable mild anterior wedging at T4. No other fractures are apparent. There is no spondylolisthesis. There is mild disc narrowing at multiple levels. No erosive change. There is extensive calcification in the left anterior descending and right coronary arteries.  IMPRESSION: Multiple stable anterior wedge compression fractures. The anterior wedge compression fracture at T7 has increased compared to the most recent prior study and may represent acute exacerbation of fracture. Levoscoliosis present. No spondylolisthesis. Extensive coronary artery calcification noted.   Electronically Signed   By: Bretta Bang III M.D.   On: 11/17/2014 11:51   Dg Lumbar Spine Complete  11/17/2014   CLINICAL DATA:  Recent fall with low back pain, initial encounter  EXAM: LUMBAR SPINE - COMPLETE 4+ VIEW  COMPARISON:  01/10/2011  FINDINGS: Compression deformities are again noted of L1 and L2 as well as T12 and T10. Some slight progression in height loss is noted at T10 and L2. No new compression deformities are seen.  Disc space narrowing is noted at L3-4, L4-5 and L5-S1. Anterolisthesis of L3 on L4 is noted and stable. No pars defects are seen. Diffuse aortic calcifications are noted. Multilevel facet hypertrophic changes are seen.  IMPRESSION: Diffuse degenerative changes.  Chronic compression deformities with some slight progression in height loss at T10 and L2 when compared with the prior exam.  No new compression  deformities are seen.   Electronically Signed   By: Alcide Clever M.D.   On: 11/17/2014 11:56   Ct Head Wo Contrast  11/17/2014   CLINICAL DATA:  Found on floor, pain, initial encounter.  EXAM: CT HEAD WITHOUT CONTRAST  CT CERVICAL SPINE WITHOUT CONTRAST  TECHNIQUE: Multidetector CT imaging of the head and cervical spine was performed following the standard protocol without intravenous contrast. Multiplanar CT image reconstructions of the cervical spine were also generated.  COMPARISON:  None.  FINDINGS: CT HEAD FINDINGS  The bony calvarium is intact. No gross soft tissue abnormality is noted. The paranasal sinuses and mastoid air  cells are well aerated. Mild atrophic changes are noted commensurate with the patient's given age. Chronic white matter ischemic changes are seen as well. No focal infarct, acute hemorrhage or acute space-occupying mass lesion are noted.  CT CERVICAL SPINE FINDINGS  Seven cervical segments are well visualized. Disc space narrowing is noted at C5-6 and C6-7 with associated osteophytes and endplate sclerosis. Mild facet hypertrophic changes are noted. No acute fracture or acute facet abnormality is noted. Surrounding soft tissues show heavy calcification within the distal left common carotid artery.  IMPRESSION: CT of the head: Atrophic and chronic ischemic changes without acute abnormality.  CT of the cervical spine: Multilevel degenerative change without acute abnormality.   Electronically Signed   By: Alcide Clever M.D.   On: 11/17/2014 12:17   Ct Cervical Spine Wo Contrast  11/17/2014   CLINICAL DATA:  Found on floor, pain, initial encounter.  EXAM: CT HEAD WITHOUT CONTRAST  CT CERVICAL SPINE WITHOUT CONTRAST  TECHNIQUE: Multidetector CT imaging of the head and cervical spine was performed following the standard protocol without intravenous contrast. Multiplanar CT image reconstructions of the cervical spine were also generated.  COMPARISON:  None.  FINDINGS: CT HEAD FINDINGS  The  bony calvarium is intact. No gross soft tissue abnormality is noted. The paranasal sinuses and mastoid air cells are well aerated. Mild atrophic changes are noted commensurate with the patient's given age. Chronic white matter ischemic changes are seen as well. No focal infarct, acute hemorrhage or acute space-occupying mass lesion are noted.  CT CERVICAL SPINE FINDINGS  Seven cervical segments are well visualized. Disc space narrowing is noted at C5-6 and C6-7 with associated osteophytes and endplate sclerosis. Mild facet hypertrophic changes are noted. No acute fracture or acute facet abnormality is noted. Surrounding soft tissues show heavy calcification within the distal left common carotid artery.  IMPRESSION: CT of the head: Atrophic and chronic ischemic changes without acute abnormality.  CT of the cervical spine: Multilevel degenerative change without acute abnormality.   Electronically Signed   By: Alcide Clever M.D.   On: 11/17/2014 12:17     EKG Interpretation   Date/Time:  Monday November 17 2014 11:43:02 EDT Ventricular Rate:  94 PR Interval:  155 QRS Duration: 90 QT Interval:  362 QTC Calculation: 453 R Axis:   -34 Text Interpretation:  Sinus rhythm Probable left atrial enlargement RSR'  in V1 or V2, probably normal variant Inferior infarct, age indeterminate  Baseline wander in lead(s) III No significant change since last tracing  Confirmed by Linkin Vizzini  MD, Shondale Quinley ([id number]) on 11/17/2014 11:51:36 AM      MDM   Final diagnoses:  Fall  Compression deformity of vertebra    11:11 AM 81 y.o. female with a history of dementia, GI bleed, coronary artery disease who presents with a unwitnessed fall at her assisted living facility. EMS reports that she was found down this morning naked with feces in her kitchen. She does not remember falling. She remembers getting up in the night to use the bathroom. She has no complaints on my exam. She does have some nonspecific back pain. She is alert  and oriented 2. She does not know the year. We'll get screening labs and imaging.  1:37 PM: Imaging/labs reviewed. Some mild worsening of compression deformities in the thoracic and lumbar spine. Pain is controlled and the patient ambulates with ease. She continues to appear well and the daughter feels comfortable taking her home. Possible mild concussion. I have discussed the diagnosis/risks/treatment  options with the patient and family and believe the pt to be eligible for discharge home to follow-up with her pcp as needed. . We also discussed returning to the ED immediately if new or worsening sx occur. We discussed the sx which are most concerning (e.g., weakness, numbness, fever, AMS) that necessitate immediate return. Medications administered to the patient during their visit and any new prescriptions provided to the patient are listed below.  Medications given during this visit Medications  traMADol (ULTRAM) tablet 50 mg (not administered)  sodium chloride 0.9 % bolus 500 mL (0 mLs Intravenous Stopped 11/17/14 1250)  acetaminophen (TYLENOL) tablet 650 mg (650 mg Oral Given 11/17/14 1150)    New Prescriptions   TRAMADOL (ULTRAM) 50 MG TABLET    Take 1 tablet (50 mg total) by mouth every 6 (six) hours as needed.     Purvis Sheffield, MD 11/17/14 1340

## 2014-11-17 NOTE — ED Notes (Signed)
Per EMS, pt from [place] at [facility].  Staff found patient in the nude in her kitchen floor.  Pt does not recall falling.  Pt recalled getting up in the middle of the night.  Loose stool found near patient.  Multiple pain sites.  Neck/Back/ankle - No obvious deformity noted.  Alert and Oriented.  Neuro WNL.  HX Knee surgery.  Takes no home meds.  12 lead WNL in route.  Vitals: cg 81, 136/80, hr 96, 94%

## 2014-11-17 NOTE — ED Notes (Signed)
RN went in room to check on pt. Pt sts "you are the first person to even come see me while I've been here." Pt assured that RN was in 30 minutes earlier with two NTs to start an IV and get urine. Pt told MD had also been in and palpated back and talked to pt. Pt responds "I don't think that happened, you must be confused with someone else."

## 2014-11-17 NOTE — ED Notes (Signed)
Patient transported to CT 

## 2014-11-17 NOTE — ED Notes (Signed)
Patient transported to X-ray 

## 2014-11-17 NOTE — ED Notes (Signed)
Pt ambulated without difficulty or assistance

## 2014-11-30 ENCOUNTER — Emergency Department (HOSPITAL_COMMUNITY)
Admission: EM | Admit: 2014-11-30 | Discharge: 2014-11-30 | Disposition: A | Discharge status: Home / Self Care | Payer: Medicare Other | Attending: Emergency Medicine | Admitting: Emergency Medicine

## 2014-11-30 ENCOUNTER — Encounter (HOSPITAL_COMMUNITY): Payer: Self-pay | Admitting: Emergency Medicine

## 2014-11-30 DIAGNOSIS — I10 Essential (primary) hypertension: Secondary | ICD-10-CM | POA: Insufficient documentation

## 2014-11-30 DIAGNOSIS — Z862 Personal history of diseases of the blood and blood-forming organs and certain disorders involving the immune mechanism: Secondary | ICD-10-CM | POA: Insufficient documentation

## 2014-11-30 DIAGNOSIS — Z8719 Personal history of other diseases of the digestive system: Secondary | ICD-10-CM | POA: Insufficient documentation

## 2014-11-30 DIAGNOSIS — F0390 Unspecified dementia without behavioral disturbance: Secondary | ICD-10-CM | POA: Insufficient documentation

## 2014-11-30 DIAGNOSIS — Z9889 Other specified postprocedural states: Secondary | ICD-10-CM | POA: Diagnosis not present

## 2014-11-30 DIAGNOSIS — M545 Low back pain: Secondary | ICD-10-CM | POA: Insufficient documentation

## 2014-11-30 DIAGNOSIS — G8929 Other chronic pain: Secondary | ICD-10-CM | POA: Diagnosis not present

## 2014-11-30 DIAGNOSIS — Z8781 Personal history of (healed) traumatic fracture: Secondary | ICD-10-CM | POA: Diagnosis not present

## 2014-11-30 DIAGNOSIS — Z880 Allergy status to penicillin: Secondary | ICD-10-CM | POA: Diagnosis not present

## 2014-11-30 DIAGNOSIS — F419 Anxiety disorder, unspecified: Secondary | ICD-10-CM | POA: Diagnosis not present

## 2014-11-30 DIAGNOSIS — I2510 Atherosclerotic heart disease of native coronary artery without angina pectoris: Secondary | ICD-10-CM | POA: Diagnosis not present

## 2014-11-30 DIAGNOSIS — F329 Major depressive disorder, single episode, unspecified: Secondary | ICD-10-CM | POA: Insufficient documentation

## 2014-11-30 DIAGNOSIS — M549 Dorsalgia, unspecified: Secondary | ICD-10-CM

## 2014-11-30 MED ORDER — TRAMADOL HCL 50 MG PO TABS
50.0000 mg | ORAL_TABLET | Freq: Once | ORAL | Status: AC
Start: 2014-11-30 — End: 2014-11-30
  Administered 2014-11-30: 50 mg via ORAL
  Filled 2014-11-30: qty 1

## 2014-11-30 NOTE — ED Notes (Addendum)
Pt upset and walked to hallway and reported that she was wanting to leave. Pt ambulated with steady gait.

## 2014-11-30 NOTE — ED Notes (Addendum)
Awake. Verbally responsive. A/O x4. Resp even and unlabored. No audible adventitious breath sounds noted. ABC's intact. Waiting to see MD/PA.

## 2014-11-30 NOTE — ED Notes (Signed)
Called and left message for [employer], dght, to make aware of pt's ready for d/c home.

## 2014-11-30 NOTE — ED Notes (Signed)
Resting quietly with eye closed. Easily arousable. Verbally responsive. Resp even and unlabored. ABC's intact. NAD noted.  

## 2014-11-30 NOTE — ED Notes (Signed)
Bed: WA09 Expected date:  Expected time:  Means of arrival:  Comments: EMS 28M back pain

## 2014-11-30 NOTE — ED Notes (Signed)
Called and left message with Desmond Dike, dght to call facility concerning pt's d/c home

## 2014-11-30 NOTE — ED Notes (Signed)
Pt arrived via EMS with report of lower back pain. Pt denies trauma/injury but reported chronic back pain just gotten worse over past couple days. Pt denies urinary frequency/urgency, abd pain, burning with urination and foul odor urine.

## 2014-11-30 NOTE — Discharge Instructions (Signed)
Back Pain, Adult Low back pain is very common. About 1 in 5 people have back pain.The cause of low back pain is rarely dangerous. The pain often gets better over time.About half of people with a sudden onset of back pain feel better in just 2 weeks. About 8 in 10 people feel better by 6 weeks.  CAUSES Some common causes of back pain include:  Strain of the muscles or ligaments supporting the spine.  Wear and tear (degeneration) of the spinal discs.  Arthritis.  Direct injury to the back. DIAGNOSIS Most of the time, the direct cause of low back pain is not known.However, back pain can be treated effectively even when the exact cause of the pain is unknown.Answering your caregiver's questions about your overall health and symptoms is one of the most accurate ways to make sure the cause of your pain is not dangerous. If your caregiver needs more information, he or she may order lab work or imaging tests (X-rays or MRIs).However, even if imaging tests show changes in your back, this usually does not require surgery. HOME CARE INSTRUCTIONS For many people, back pain returns.Since low back pain is rarely dangerous, it is often a condition that people can learn to manageon their own.   Remain active. It is stressful on the back to sit or stand in one place. Do not sit, drive, or stand in one place for more than 30 minutes at a time. Take short walks on level surfaces as soon as pain allows.Try to increase the length of time you walk each day.  Do not stay in bed.Resting more than 1 or 2 days can delay your recovery.  Do not avoid exercise or work.Your body is made to move.It is not dangerous to be active, even though your back may hurt.Your back will likely heal faster if you return to being active before your pain is gone.  Pay attention to your body when you bend and lift. Many people have less discomfortwhen lifting if they bend their knees, keep the load close to their bodies,and  avoid twisting. Often, the most comfortable positions are those that put less stress on your recovering back.  Find a comfortable position to sleep. Use a firm mattress and lie on your side with your knees slightly bent. If you lie on your back, put a pillow under your knees.  Only take over-the-counter or prescription medicines as directed by your caregiver. Over-the-counter medicines to reduce pain and inflammation are often the most helpful.Your caregiver may prescribe muscle relaxant drugs.These medicines help dull your pain so you can more quickly return to your normal activities and healthy exercise.  Put ice on the injured area.  Put ice in a plastic bag.  Place a towel between your skin and the bag.  Leave the ice on for 15-20 minutes, 03-04 times a day for the first 2 to 3 days. After that, ice and heat may be alternated to reduce pain and spasms.  Ask your caregiver about trying back exercises and gentle massage. This may be of some benefit.  Avoid feeling anxious or stressed.Stress increases muscle tension and can worsen back pain.It is important to recognize when you are anxious or stressed and learn ways to manage it.Exercise is a great option. SEEK MEDICAL CARE IF:  You have pain that is not relieved with rest or medicine.  You have pain that does not improve in 1 week.  You have new symptoms.  You are generally not feeling well. SEEK   IMMEDIATE MEDICAL CARE IF:   You have pain that radiates from your back into your legs.  You develop new bowel or bladder control problems.  You have unusual weakness or numbness in your arms or legs.  You develop nausea or vomiting.  You develop abdominal pain.  You feel faint. Document Released: 08/15/2005 Document Revised: 02/14/2012 Document Reviewed: 12/17/2013 Exit[employer] Patient Information 2015 [employer]. This information is not intended to replace advice given to you by your health care provider. Make sure you  discuss any questions you have with your health care provider.  

## 2014-11-30 NOTE — ED Notes (Signed)
Awake. Verbally responsive. A/O x4. Resp even and unlabored. No audible adventitious breath sounds noted. ABC's intact.  

## 2014-11-30 NOTE — ED Provider Notes (Signed)
CSN: [id number]     Arrival date & time 11/30/14  0105 History   First MD Initiated Contact with Patient 11/30/14 0350     Chief Complaint  Patient presents with  . Back Pain     (Consider location/radiation/quality/duration/timing/severity/associated sxs/prior Treatment) Patient is a 81 y.o. female presenting with back pain. The history is provided by the patient. No language interpreter was used.  Back Pain Location:  Lumbar spine Associated symptoms: no dysuria and no fever   Associated symptoms comment:  The patient arrives with complaint of low back pain she states is chronic but worse this evening. No new injury. No fever or vomiting. She denies urinary symptoms.    Past Medical History  Diagnosis Date  . Anemia     iron deficient  . Upper GI bleeding 09/14/11    "have had it once before today"  . Migraines     "haven't had one since going thru menopause"  . Depression   . Anxiety   . Rheumatoid arthritis(714.0)   . Chronic lower back pain   . Hypertension   . Dementia     needs redirection freq.  . 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 CAD (coronary artery disease)     a. s/p Inf STEMI 2/14 => LHC 10/18/12: Diffuse distal LM 30%, LAD 50% calcified stenosis just after first septal perforator, diagonal branches with mild 20-30% stenosis, mid RCA 99%. PCI: Promus Premier DES to the mid RCA  . Ischemic cardiomyopathy     a. Echocardiogram 10/18/12: Mild LVH, inferior and inferoseptal AK, posterior HK, EF 45%, grade 1 diastolic dysfunction, aortic sclerosis without stenosis, mild MR, mild LAE, RV systolic function moderately reduced (pacer wire in right ventricle), PASP 32  . Carotid stenosis     a. LifeLine screening done in 2009 with carotid stenosis noted;  b.  Carotid dopplers 3/14: R 0-39%, L 40-59%, repeat 1 year   Past Surgical History  Procedure Laterality Date  . Back surgery    .  Joint replacement    . Lumbar disc surgery  09/2002    Hemilaminectomy, L3, complete laminectomy, L4, hemi- semilaminectomy, L5 and right S1, and diskectomy, L5-S1.  Marland Kitchen Total knee arthroplasty  01/2002    right  . Incision and drainage of wound  10/2002    lumbar wound infection  . Knee arthroscopy  01/2001    right  . Hematoma evacuation  10/2002    lumbar  . Lumbar laminectomy  10/2003    for epidural abscess evacuation; L4-5  . Breast lumpectomy      "benign tumor removed"  . Esophagogastroduodenoscopy  09/15/2011    Procedure: ESOPHAGOGASTRODUODENOSCOPY (EGD);  Surgeon: Barrie Folk, MD;  Location: [facility] ENDOSCOPY;  Service: Endoscopy;  Laterality: N/A;  . Orif humerus fracture  08/17/2012    Procedure: OPEN REDUCTION INTERNAL FIXATION (ORIF) DISTAL HUMERUS FRACTURE;  Surgeon: Eulas Post, MD;  Location: [facility] OR;  Service: Orthopedics;  Laterality: Right;  ORIF RIGHT SUPRACHONDULAR HUMERUS FRACTURE  . Harvest bone graft  08/17/2012    Procedure: HARVEST ILIAC BONE GRAFT;  Surgeon: Eulas Post, MD;  Location: [facility] OR;  Service: Orthopedics;  Laterality: Right;  . Left heart catheterization with coronary angiogram N/A 10/18/2012    Procedure: LEFT HEART CATHETERIZATION WITH CORONARY ANGIOGRAM;  Surgeon: Tonny Bollman, MD;  Location: [facility] CATH LAB;  Service: Cardiovascular;  Laterality: N/A;   Family History  Problem Relation Age of  Onset  . Anesthesia problems Neg Hx   . Hypotension Neg Hx   . Malignant hyperthermia Neg Hx   . Pseudochol deficiency Neg Hx   . Hypertension Sister   . Heart attack Mother   . Stroke Mother    History  Substance Use Topics  . Smoking status: Never Smoker   . Smokeless tobacco: Never Used  . Alcohol Use: 3.0 oz/week    5 Glasses of wine per week   OB History    Gravida Para Term Preterm AB TAB SAB Ectopic Multiple Living   0 0 0 0 0 0 0 0 0 0      Review of Systems  Constitutional: Negative for fever and chills.  Genitourinary: Negative.  Negative  for dysuria.  Musculoskeletal: Positive for back pain.       See HPI.  Skin: Negative.   Neurological: Negative.       Allergies  Iron; Penicillins; and Sulfa drugs cross reactors  Home Medications   Prior to Admission medications   Medication Sig Start Date End Date Taking? Authorizing Provider  diphenhydrAMINE (SOMINEX) 25 MG tablet Take 25 mg by mouth at bedtime.    Yes Historical Provider, MD  traMADol (ULTRAM) 50 MG tablet Take 1 tablet (50 mg total) by mouth every 6 (six) hours as needed. Patient not taking: Reported on 11/30/2014 11/17/14   11/19/14, MD   BP 139/66 mmHg  Pulse 119  Temp(Src) 97.9 F (36.6 C) (Oral)  Resp 18  SpO2 93% Physical Exam  Constitutional: She is oriented to person, place, and time. She appears well-developed and well-nourished. No distress.  Pulmonary/Chest: Effort normal.  Musculoskeletal: Normal range of motion.  Patient is ambulatory without difficulty. There is no sign of trauma to the back, no swelling. She denies tenderness.   Neurological: She is alert and oriented to person, place, and time.  Skin: Skin is warm and dry.  Psychiatric: She has a normal mood and affect.    ED Course  Procedures (including critical care time) Labs Review Labs Reviewed - No data to display  Imaging Review No results found.   EKG Interpretation None      MDM   Final diagnoses:  None    1. Chronic back pain  The patient denies any current pain. "I just want to go home. Nothing hurts". Attempts to collect urine unsuccessful. Patient declines urine because she states she knows pain was the same as her usual. She again denies current pain on my exam.   Patient is from home via EMS. She is oriented, ambulatory and denies current pain. Will discharge home and encourage follow up with Dr. Purvis Sheffield for recheck this week.     Pete Glatter, PA-C 11/30/14 01/30/15  [id number], MD 11/30/14 [phone]

## 2015-01-28 ENCOUNTER — Other Ambulatory Visit (HOSPITAL_COMMUNITY): Payer: Self-pay | Admitting: Cardiovascular Disease

## 2015-01-28 DIAGNOSIS — I6523 Occlusion and stenosis of bilateral carotid arteries: Secondary | ICD-10-CM

## 2015-01-30 ENCOUNTER — Encounter (HOSPITAL_COMMUNITY): Payer: Self-pay | Admitting: Emergency Medicine

## 2015-01-30 ENCOUNTER — Emergency Department (HOSPITAL_COMMUNITY): Payer: Medicare Other

## 2015-01-30 ENCOUNTER — Emergency Department (HOSPITAL_COMMUNITY)
Admission: EM | Admit: 2015-01-30 | Discharge: 2015-01-30 | Disposition: A | Discharge status: Home / Self Care | Payer: Medicare Other | Attending: Emergency Medicine | Admitting: Emergency Medicine

## 2015-01-30 DIAGNOSIS — S8001XA Contusion of right knee, initial encounter: Secondary | ICD-10-CM | POA: Diagnosis not present

## 2015-01-30 DIAGNOSIS — I10 Essential (primary) hypertension: Secondary | ICD-10-CM | POA: Diagnosis not present

## 2015-01-30 DIAGNOSIS — W19XXXA Unspecified fall, initial encounter: Secondary | ICD-10-CM | POA: Insufficient documentation

## 2015-01-30 DIAGNOSIS — Z880 Allergy status to penicillin: Secondary | ICD-10-CM | POA: Insufficient documentation

## 2015-01-30 DIAGNOSIS — F419 Anxiety disorder, unspecified: Secondary | ICD-10-CM | POA: Diagnosis not present

## 2015-01-30 DIAGNOSIS — Z8739 Personal history of other diseases of the musculoskeletal system and connective tissue: Secondary | ICD-10-CM | POA: Insufficient documentation

## 2015-01-30 DIAGNOSIS — F0390 Unspecified dementia without behavioral disturbance: Secondary | ICD-10-CM | POA: Insufficient documentation

## 2015-01-30 DIAGNOSIS — Z9889 Other specified postprocedural states: Secondary | ICD-10-CM | POA: Insufficient documentation

## 2015-01-30 DIAGNOSIS — Z8781 Personal history of (healed) traumatic fracture: Secondary | ICD-10-CM | POA: Insufficient documentation

## 2015-01-30 DIAGNOSIS — Y998 Other external cause status: Secondary | ICD-10-CM | POA: Insufficient documentation

## 2015-01-30 DIAGNOSIS — Y9389 Activity, other specified: Secondary | ICD-10-CM | POA: Insufficient documentation

## 2015-01-30 DIAGNOSIS — I2510 Atherosclerotic heart disease of native coronary artery without angina pectoris: Secondary | ICD-10-CM | POA: Insufficient documentation

## 2015-01-30 DIAGNOSIS — Y9289 Other specified places as the place of occurrence of the external cause: Secondary | ICD-10-CM | POA: Diagnosis not present

## 2015-01-30 DIAGNOSIS — Z862 Personal history of diseases of the blood and blood-forming organs and certain disorders involving the immune mechanism: Secondary | ICD-10-CM | POA: Insufficient documentation

## 2015-01-30 DIAGNOSIS — Z8719 Personal history of other diseases of the digestive system: Secondary | ICD-10-CM | POA: Insufficient documentation

## 2015-01-30 DIAGNOSIS — G8929 Other chronic pain: Secondary | ICD-10-CM | POA: Diagnosis not present

## 2015-01-30 DIAGNOSIS — F329 Major depressive disorder, single episode, unspecified: Secondary | ICD-10-CM | POA: Diagnosis not present

## 2015-01-30 DIAGNOSIS — Z043 Encounter for examination and observation following other accident: Secondary | ICD-10-CM | POA: Diagnosis present

## 2015-01-30 LAB — COMPREHENSIVE METABOLIC PANEL
ALBUMIN: 3.6 g/dL (ref 3.5–5.0)
ALT: 11 U/L — ABNORMAL LOW (ref 14–54)
AST: 19 U/L (ref 15–41)
Alkaline Phosphatase: 71 U/L (ref 38–126)
Anion gap: 10 (ref 5–15)
BILIRUBIN TOTAL: 0.4 mg/dL (ref 0.3–1.2)
BUN: 15 mg/dL (ref 6–20)
CO2: 26 mmol/L (ref 22–32)
Calcium: 9 mg/dL (ref 8.9–10.3)
Chloride: 101 mmol/L (ref 101–111)
Creatinine, Ser: 0.92 mg/dL (ref 0.44–1.00)
GFR calc Af Amer: >60 mL/min (ref >60)
GFR, EST NON AFRICAN AMERICAN: 56 mL/min — AB (ref >60)
Glucose, Bld: 109 mg/dL — ABNORMAL HIGH (ref 65–99)
Potassium: 3.9 mmol/L (ref 3.5–5.1)
Sodium: 137 mmol/L (ref 135–145)
Total Protein: 7 g/dL (ref 6.5–8.1)

## 2015-01-30 LAB — CBC WITH DIFFERENTIAL/PLATELET
Basophils Absolute: 0 10*3/uL (ref 0.0–0.1)
Basophils Relative: 1 % (ref 0–1)
EOS ABS: 0.2 10*3/uL (ref 0.0–0.7)
Eosinophils Relative: 4 % (ref 0–5)
HCT: 33.8 % — ABNORMAL LOW (ref 36.0–46.0)
Hemoglobin: 10.9 g/dL — ABNORMAL LOW (ref 12.0–15.0)
LYMPHS ABS: 0.6 10*3/uL — AB (ref 0.7–4.0)
LYMPHS PCT: 12 % (ref 12–46)
MCH: 29.5 pg (ref 26.0–34.0)
MCHC: 32.2 g/dL (ref 30.0–36.0)
MCV: 91.4 fL (ref 78.0–100.0)
MONO ABS: 0.7 10*3/uL (ref 0.1–1.0)
MONOS PCT: 14 % — AB (ref 3–12)
NEUTROS ABS: 3.5 10*3/uL (ref 1.7–7.7)
Neutrophils Relative %: 69 % (ref 43–77)
Platelets: 229 10*3/uL (ref 150–400)
RBC: 3.7 MIL/uL — ABNORMAL LOW (ref 3.87–5.11)
RDW: 15.1 % (ref 11.5–15.5)
WBC: 5 10*3/uL (ref 4.0–10.5)

## 2015-01-30 LAB — POC OCCULT BLOOD, ED: Fecal Occult Bld: NEGATIVE

## 2015-01-30 NOTE — Discharge Instructions (Signed)
Fall Prevention and Home Safety Call Dr. Laverle Hobby office on Monday, 02/02/2015 to schedule the next available office visit. Return if your condition worsens for any reason. Use your walker at all times Falls cause injuries and can affect all age groups. It is possible to prevent falls.  HOW TO PREVENT FALLS  Wear shoes with rubber soles that do not have an opening for your toes.  Keep the inside and outside of your house well lit.  Use night lights throughout your home.  Remove clutter from floors.  Clean up floor spills.  Remove throw rugs or fasten them to the floor with carpet tape.  Do not place electrical cords across pathways.  Put grab bars by your tub, shower, and toilet. Do not use towel bars as grab bars.  Put handrails on both sides of the stairway. Fix loose handrails.  Do not climb on stools or stepladders, if possible.  Do not wax your floors.  Repair uneven or unsafe sidewalks, walkways, or stairs.  Keep items you use a lot within reach.  Be aware of pets.  Keep emergency numbers next to the telephone.  Put smoke detectors in your home and near bedrooms. Ask your doctor what other things you can do to prevent falls. Document Released: 06/11/2009 Document Revised: 02/14/2012 Document Reviewed: 11/15/2011 [facility] Patient Information 2015 [place], [place]. This information is not intended to replace advice given to you by your health care provider. Make sure you discuss any questions you have with your health care provider.

## 2015-01-30 NOTE — ED Notes (Signed)
Transporter from [facility] was called and states he will be here in 15 minutes to take pt back to home. Dr.J spoke on phone with Daughter Desmond Dike

## 2015-01-30 NOTE — ED Provider Notes (Signed)
CSN: [id number]     Arrival date & time 01/30/15  1141 History   First MD Initiated Contact with Patient 01/30/15 1200     Chief Complaint  Patient presents with  . Fall     (Consider location/radiation/quality/duration/timing/severity/associated sxs/prior Treatment) HPI Level V caveat dementia . History is obtained from patient and from her daughter and power of attorney. Patient fell yesterday. She presently offers no complaint. She does not recall a fall. Denies pain anywhere. Walks with walker. No treatment prior to coming here. She denies loss of consciousness. Past Medical History  Diagnosis Date  . Anemia     iron deficient  . Upper GI bleeding 09/14/11    "have had it once before today"  . Migraines     "haven't had one since going thru menopause"  . Depression   . Anxiety   . Rheumatoid arthritis(714.0)   . Chronic lower back pain   . Hypertension   . Dementia     needs redirection freq.  . 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 CAD (coronary artery disease)     a. s/p Inf STEMI 2/14 => LHC 10/18/12: Diffuse distal LM 30%, LAD 50% calcified stenosis just after first septal perforator, diagonal branches with mild 20-30% stenosis, mid RCA 99%. PCI: Promus Premier DES to the mid RCA  . Ischemic cardiomyopathy     a. Echocardiogram 10/18/12: Mild LVH, inferior and inferoseptal AK, posterior HK, EF 45%, grade 1 diastolic dysfunction, aortic sclerosis without stenosis, mild MR, mild LAE, RV systolic function moderately reduced (pacer wire in right ventricle), PASP 32  . Carotid stenosis     a. LifeLine screening done in 2009 with carotid stenosis noted;  b.  Carotid dopplers 3/14: R 0-39%, L 40-59%, repeat 1 year   Past Surgical History  Procedure Laterality Date  . Back surgery    . Joint replacement    . Lumbar disc surgery  09/2002    Hemilaminectomy, L3, complete laminectomy, L4, hemi-  semilaminectomy, L5 and right S1, and diskectomy, L5-S1.  Marland Kitchen Total knee arthroplasty  01/2002    right  . Incision and drainage of wound  10/2002    lumbar wound infection  . Knee arthroscopy  01/2001    right  . Hematoma evacuation  10/2002    lumbar  . Lumbar laminectomy  10/2003    for epidural abscess evacuation; L4-5  . Breast lumpectomy      "benign tumor removed"  . Esophagogastroduodenoscopy  09/15/2011    Procedure: ESOPHAGOGASTRODUODENOSCOPY (EGD);  Surgeon: Barrie Folk, MD;  Location: [facility] ENDOSCOPY;  Service: Endoscopy;  Laterality: N/A;  . Orif humerus fracture  08/17/2012    Procedure: OPEN REDUCTION INTERNAL FIXATION (ORIF) DISTAL HUMERUS FRACTURE;  Surgeon: Eulas Post, MD;  Location: [facility] OR;  Service: Orthopedics;  Laterality: Right;  ORIF RIGHT SUPRACHONDULAR HUMERUS FRACTURE  . Harvest bone graft  08/17/2012    Procedure: HARVEST ILIAC BONE GRAFT;  Surgeon: Eulas Post, MD;  Location: [facility] OR;  Service: Orthopedics;  Laterality: Right;  . Left heart catheterization with coronary angiogram N/A 10/18/2012    Procedure: LEFT HEART CATHETERIZATION WITH CORONARY ANGIOGRAM;  Surgeon: Tonny Bollman, MD;  Location: [facility] CATH LAB;  Service: Cardiovascular;  Laterality: N/A;   Family History  Problem Relation Age of Onset  . Anesthesia problems Neg Hx   . Hypotension Neg Hx   . Malignant hyperthermia Neg Hx   . Pseudochol  deficiency Neg Hx   . Hypertension Sister   . Heart attack Mother   . Stroke Mother    History  Substance Use Topics  . Smoking status: Never Smoker   . Smokeless tobacco: Never Used  . Alcohol Use: 3.0 oz/week    5 Glasses of wine per week   OB History    Gravida Para Term Preterm AB TAB SAB Ectopic Multiple Living   0 0 0 0 0 0 0 0 0 0      Review of Systems  Unable to perform ROS: Dementia  Musculoskeletal: Positive for gait problem.       Walks with walker      Allergies  Iron; Penicillins; and Sulfa drugs cross reactors  Home  Medications   Prior to Admission medications   Medication Sig Start Date End Date Taking? Authorizing Provider  diphenhydrAMINE (SOMINEX) 25 MG tablet Take 25 mg by mouth at bedtime.     Historical Provider, MD  traMADol (ULTRAM) 50 MG tablet Take 1 tablet (50 mg total) by mouth every 6 (six) hours as needed. Patient not taking: Reported on 11/30/2014 11/17/14   11/19/14, MD   BP 147/71 mmHg  Pulse 86  Temp(Src) 98.3 F (36.8 C) (Oral)  Resp 19  Ht 5\' 3"  (1.6 m)  Wt 135 lb (61.236 kg)  BMI 23.92 kg/m2  SpO2 100% Physical Exam  Constitutional: She appears well-developed and well-nourished.  HENT:  Head: Normocephalic and atraumatic.  Eyes: Conjunctivae are normal. Pupils are equal, round, and reactive to light.  Neck: Neck supple. No tracheal deviation present. No thyromegaly present.  Cardiovascular: Normal rate and regular rhythm.   No murmur heard. Pulmonary/Chest: Effort normal and breath sounds normal.  Abdominal: Soft. Bowel sounds are normal. She exhibits no distension. There is no tenderness.  Genitourinary: Guaiac negative stool.  Rectum normal tone nontender brown stool  Musculoskeletal: Normal range of motion. She exhibits no edema or tenderness.  Entire spine nontender. Pelvis stable nontender.. Right lower extremity with grapefruit-sized ecchymosis at the medial aspect of knee. With mild tenderness. DP pulse 2+.  Neurological: She is alert. Coordination normal.  Oriented to name and hospital does not know month or year. Walks with minimal assistance. No limp  Skin: Skin is warm and dry. No rash noted.  Psychiatric: She has a normal mood and affect.  Nursing note and vitals reviewed.   ED Course  Procedures (including critical care time) Labs Review Labs Reviewed - No data to display  Imaging Review No results found.   EKG Interpretation   Date/Time:  Friday January 30 2015 11:44:41 EDT Ventricular Rate:  88 PR Interval:  151 QRS Duration: 92 QT  Interval:  395 QTC Calculation: 478 R Axis:   -32 Text Interpretation:  Sinus rhythm Left ventricular hypertrophy Inferior  infarct, old Anterior infarct, old No significant change since last  tracing Confirmed by Lawanda Holzheimer  MD, Merlina Marchena [phone]) on 01/30/2015 12:44:35 PM     3 PM patient alert and reading the newspaper in bed no distress denies complaint. Results for orders placed or performed during the hospital encounter of 01/30/15  Comprehensive metabolic panel  Result Value Ref Range   Sodium 137 135 - 145 mmol/L   Potassium 3.9 3.5 - 5.1 mmol/L   Chloride 101 101 - 111 mmol/L   CO2 26 22 - 32 mmol/L   Glucose, Bld 109 (H) 65 - 99 mg/dL   BUN 15 6 - 20 mg/dL   Creatinine, Ser 04/01/2015 0.44 -  1.00 mg/dL   Calcium 9.0 8.9 - 10.2 mg/dL   Total Protein 7.0 6.5 - 8.1 g/dL   Albumin 3.6 3.5 - 5.0 g/dL   AST 19 15 - 41 U/L   ALT 11 (L) 14 - 54 U/L   Alkaline Phosphatase 71 38 - 126 U/L   Total Bilirubin 0.4 0.3 - 1.2 mg/dL   GFR calc non Af Amer 56 (L) >60 mL/min   GFR calc Af Amer >60 >60 mL/min   Anion gap 10 5 - 15  CBC with Differential/Platelet  Result Value Ref Range   WBC 5.0 4.0 - 10.5 K/uL   RBC 3.70 (L) 3.87 - 5.11 MIL/uL   Hemoglobin 10.9 (L) 12.0 - 15.0 g/dL   HCT 58.5 (L) 27.7 - 82.4 %   MCV 91.4 78.0 - 100.0 fL   MCH 29.5 26.0 - 34.0 pg   MCHC 32.2 30.0 - 36.0 g/dL   RDW 23.5 36.1 - 44.3 %   Platelets 229 150 - 400 K/uL   Neutrophils Relative % 69 43 - 77 %   Neutro Abs 3.5 1.7 - 7.7 K/uL   Lymphocytes Relative 12 12 - 46 %   Lymphs Abs 0.6 (L) 0.7 - 4.0 K/uL   Monocytes Relative 14 (H) 3 - 12 %   Monocytes Absolute 0.7 0.1 - 1.0 K/uL   Eosinophils Relative 4 0 - 5 %   Eosinophils Absolute 0.2 0.0 - 0.7 K/uL   Basophils Relative 1 0 - 1 %   Basophils Absolute 0.0 0.0 - 0.1 K/uL   Dg Knee Complete 4 Views Right  01/30/2015   CLINICAL DATA:  81 year old female with a history of knee pain.  EXAM: RIGHT KNEE - COMPLETE 4+ VIEW  COMPARISON:  08/01/2013  FINDINGS: Re-  demonstrates an surgical changes of need arthroplasty. No acute bony abnormality. Degenerative changes at the patella. No complicating features.  IMPRESSION: No acute bony abnormality.  Surgical changes of knee arthroplasty with no complicating features.  Signed,  Yvone Neu. Loreta Ave, DO  [facility]  [facility]   Electronically Signed   By: Gilmer Mor D.O.   On: 01/30/2015 13:55    MDM  I spoke with Ms. Misty Stanley small, patient's emergency contact and daughter. Okay to send patient back to her home. I also spoke with Dr. Nehemiah Settle, on-call for Dr. Pete Glatter to advise of patient's recent visits the emergency department for falls.  Final diagnoses:  None    diagnosis #1 fall  #2 contusion right knee  #3 anemia      Doug Sou, MD 01/30/15 1644

## 2015-01-30 NOTE — ED Notes (Signed)
Pt transported to xray 

## 2015-01-30 NOTE — ED Notes (Signed)
Per ems pt had an unwitnessed fall yesterday at [facility] [place]. Pt reports she doesn't remember how she fell yesterday she just remembers being on the floor. Pt has large bruise to right knee and right inner thigh. Pt denies any pain at this time. Pt is alert and ox4. Pt states she has been having frequent falls for the last few weeks.

## 2015-02-04 ENCOUNTER — Ambulatory Visit (HOSPITAL_COMMUNITY): Payer: Medicare Other | Attending: Cardiovascular Disease

## 2015-06-08 ENCOUNTER — Encounter (HOSPITAL_COMMUNITY): Payer: Self-pay

## 2015-06-08 ENCOUNTER — Emergency Department (HOSPITAL_COMMUNITY): Payer: Medicare Other

## 2015-06-08 ENCOUNTER — Emergency Department (HOSPITAL_COMMUNITY)
Admission: EM | Admit: 2015-06-08 | Discharge: 2015-06-08 | Disposition: A | Discharge status: Home / Self Care | Payer: Medicare Other | Attending: Emergency Medicine | Admitting: Emergency Medicine

## 2015-06-08 DIAGNOSIS — I10 Essential (primary) hypertension: Secondary | ICD-10-CM | POA: Insufficient documentation

## 2015-06-08 DIAGNOSIS — Z862 Personal history of diseases of the blood and blood-forming organs and certain disorders involving the immune mechanism: Secondary | ICD-10-CM | POA: Insufficient documentation

## 2015-06-08 DIAGNOSIS — Z8739 Personal history of other diseases of the musculoskeletal system and connective tissue: Secondary | ICD-10-CM | POA: Insufficient documentation

## 2015-06-08 DIAGNOSIS — S3992XA Unspecified injury of lower back, initial encounter: Secondary | ICD-10-CM | POA: Insufficient documentation

## 2015-06-08 DIAGNOSIS — Z880 Allergy status to penicillin: Secondary | ICD-10-CM | POA: Diagnosis not present

## 2015-06-08 DIAGNOSIS — I2510 Atherosclerotic heart disease of native coronary artery without angina pectoris: Secondary | ICD-10-CM | POA: Diagnosis not present

## 2015-06-08 DIAGNOSIS — S79912A Unspecified injury of left hip, initial encounter: Secondary | ICD-10-CM | POA: Diagnosis not present

## 2015-06-08 DIAGNOSIS — F329 Major depressive disorder, single episode, unspecified: Secondary | ICD-10-CM | POA: Insufficient documentation

## 2015-06-08 DIAGNOSIS — W1839XA Other fall on same level, initial encounter: Secondary | ICD-10-CM | POA: Diagnosis not present

## 2015-06-08 DIAGNOSIS — S79911A Unspecified injury of right hip, initial encounter: Secondary | ICD-10-CM | POA: Insufficient documentation

## 2015-06-08 DIAGNOSIS — M545 Low back pain, unspecified: Secondary | ICD-10-CM

## 2015-06-08 DIAGNOSIS — G8929 Other chronic pain: Secondary | ICD-10-CM | POA: Insufficient documentation

## 2015-06-08 DIAGNOSIS — Z9889 Other specified postprocedural states: Secondary | ICD-10-CM | POA: Diagnosis not present

## 2015-06-08 DIAGNOSIS — Y9389 Activity, other specified: Secondary | ICD-10-CM | POA: Diagnosis not present

## 2015-06-08 DIAGNOSIS — Y998 Other external cause status: Secondary | ICD-10-CM | POA: Diagnosis not present

## 2015-06-08 DIAGNOSIS — Y9289 Other specified places as the place of occurrence of the external cause: Secondary | ICD-10-CM | POA: Diagnosis not present

## 2015-06-08 DIAGNOSIS — F0390 Unspecified dementia without behavioral disturbance: Secondary | ICD-10-CM | POA: Insufficient documentation

## 2015-06-08 DIAGNOSIS — F419 Anxiety disorder, unspecified: Secondary | ICD-10-CM | POA: Diagnosis not present

## 2015-06-08 LAB — URINALYSIS, ROUTINE W REFLEX MICROSCOPIC
BILIRUBIN URINE: NEGATIVE
GLUCOSE, UA: NEGATIVE mg/dL
KETONES UR: NEGATIVE mg/dL
LEUKOCYTES UA: NEGATIVE
Nitrite: NEGATIVE
PH: 5 (ref 5.0–8.0)
PROTEIN: NEGATIVE mg/dL
Specific Gravity, Urine: 1.018 (ref 1.005–1.030)
Urobilinogen, UA: 0.2 mg/dL (ref 0.0–1.0)

## 2015-06-08 LAB — URINE MICROSCOPIC-ADD ON

## 2015-06-08 MED ORDER — ACETAMINOPHEN 325 MG PO TABS
650.0000 mg | ORAL_TABLET | Freq: Once | ORAL | Status: AC
  Administered 2015-06-08: 650 mg via ORAL
  Filled 2015-06-08: qty 2

## 2015-06-08 MED ORDER — TRAMADOL HCL 50 MG PO TABS
50.0000 mg | ORAL_TABLET | Freq: Once | ORAL | Status: AC
  Administered 2015-06-08: 50 mg via ORAL
  Filled 2015-06-08: qty 1

## 2015-06-08 NOTE — Discharge Instructions (Signed)
CONTINUE ULTRAM FOR PAIN AND ADD TYLENOL IF PAIN IS UNCONTROLLED. FOLLOW UP WITH DR. Pete Glatter FOR RECHECK AND FURTHER PAIN MANAGEMENT IF NO BETTER.   Back Pain, Adult Back pain is very common in adults.The cause of back pain is rarely dangerous and the pain often gets better over time.The cause of your back pain may not be known. Some common causes of back pain include:  Strain of the muscles or ligaments supporting the spine.  Wear and tear (degeneration) of the spinal disks.  Arthritis.  Direct injury to the back. For many people, back pain may return. Since back pain is rarely dangerous, most people can learn to manage this condition on their own. HOME CARE INSTRUCTIONS Watch your back pain for any changes. The following actions may help to lessen any discomfort you are feeling:  Remain active. It is stressful on your back to sit or stand in one place for long periods of time. Do not sit, drive, or stand in one place for more than 30 minutes at a time. Take short walks on even surfaces as soon as you are able.Try to increase the length of time you walk each day.  Exercise regularly as directed by your health care provider. Exercise helps your back heal faster. It also helps avoid future injury by keeping your muscles strong and flexible.  Do not stay in bed.Resting more than 1-2 days can delay your recovery.  Pay attention to your body when you bend and lift. The most comfortable positions are those that put less stress on your recovering back. Always use proper lifting techniques, including:  Bending your knees.  Keeping the load close to your body.  Avoiding twisting.  Find a comfortable position to sleep. Use a firm mattress and lie on your side with your knees slightly bent. If you lie on your back, put a pillow under your knees.  Avoid feeling anxious or stressed.Stress increases muscle tension and can worsen back pain.It is important to recognize when you are anxious  or stressed and learn ways to manage it, such as with exercise.  Take medicines only as directed by your health care provider. Over-the-counter medicines to reduce pain and inflammation are often the most helpful.Your health care provider may prescribe muscle relaxant drugs.These medicines help dull your pain so you can more quickly return to your normal activities and healthy exercise.  Apply ice to the injured area:  Put ice in a plastic bag.  Place a towel between your skin and the bag.  Leave the ice on for 20 minutes, 2-3 times a day for the first 2-3 days. After that, ice and heat may be alternated to reduce pain and spasms.  Maintain a healthy weight. Excess weight puts extra stress on your back and makes it difficult to maintain good posture. SEEK MEDICAL CARE IF:  You have pain that is not relieved with rest or medicine.  You have increasing pain going down into the legs or buttocks.  You have pain that does not improve in one week.  You have night pain.  You lose weight.  You have a fever or chills. SEEK IMMEDIATE MEDICAL CARE IF:   You develop new bowel or bladder control problems.  You have unusual weakness or numbness in your arms or legs.  You develop nausea or vomiting.  You develop abdominal pain.  You feel faint.   This information is not intended to replace advice given to you by your health care provider. Make sure you  discuss any questions you have with your health care provider.   Document Released: 08/15/2005 Document Revised: 09/05/2014 Document Reviewed: 12/17/2013 Elsevier Interactive Patient Education [employer].

## 2015-06-08 NOTE — ED Notes (Signed)
Per [facility]- Pt resides at [facility]. Full Code. Pt reports left flank pain. Pt reports she fell 5 days ago. Pt states she has increased pain with sitting. NO LOC with fall. No neck or back pain

## 2015-06-08 NOTE — ED Provider Notes (Signed)
CSN: [id number]     Arrival date & time 06/08/15  1541 History   First MD Initiated Contact with Patient 06/08/15 1639     No chief complaint on file.    (Consider location/radiation/quality/duration/timing/severity/associated sxs/prior Treatment) HPI Comments: Patient from [facility] with complaint of painful ambulation since fall several days ago. She described a mechanical fall and being able to get up afterward and walk without difficulty. She is here for evaluation of ongoing pain in bilateral hips since that time, and new flank pain on left. She denies abdominal pain, chest pain, fever, cough. No head injury during the fall and no neck pain since the fall.   The history is provided by the patient. No language interpreter was used.    Past Medical History  Diagnosis Date  . Anemia     iron deficient  . Upper GI bleeding 09/14/11    "have had it once before today"  . Migraines     "haven't had one since going thru menopause"  . Depression   . Anxiety   . Rheumatoid arthritis(714.0)   . Chronic lower back pain   . Hypertension   . Dementia     needs redirection freq.  . 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 CAD (coronary artery disease)     a. s/p Inf STEMI 2/14 => LHC 10/18/12: Diffuse distal LM 30%, LAD 50% calcified stenosis just after first septal perforator, diagonal branches with mild 20-30% stenosis, mid RCA 99%. PCI: Promus Premier DES to the mid RCA  . Ischemic cardiomyopathy     a. Echocardiogram 10/18/12: Mild LVH, inferior and inferoseptal AK, posterior HK, EF 45%, grade 1 diastolic dysfunction, aortic sclerosis without stenosis, mild MR, mild LAE, RV systolic function moderately reduced (pacer wire in right ventricle), PASP 32  . Carotid stenosis     a. LifeLine screening done in 2009 with carotid stenosis noted;  b.  Carotid dopplers 3/14: R 0-39%, L 40-59%, repeat 1  year   Past Surgical History  Procedure Laterality Date  . Back surgery    . Joint replacement    . Lumbar disc surgery  09/2002    Hemilaminectomy, L3, complete laminectomy, L4, hemi- semilaminectomy, L5 and right S1, and diskectomy, L5-S1.  Marland Kitchen Total knee arthroplasty  01/2002    right  . Incision and drainage of wound  10/2002    lumbar wound infection  . Knee arthroscopy  01/2001    right  . Hematoma evacuation  10/2002    lumbar  . Lumbar laminectomy  10/2003    for epidural abscess evacuation; L4-5  . Breast lumpectomy      "benign tumor removed"  . Esophagogastroduodenoscopy  09/15/2011    Procedure: ESOPHAGOGASTRODUODENOSCOPY (EGD);  Surgeon: Barrie Folk, MD;  Location: [facility] ENDOSCOPY;  Service: Endoscopy;  Laterality: N/A;  . Orif humerus fracture  08/17/2012    Procedure: OPEN REDUCTION INTERNAL FIXATION (ORIF) DISTAL HUMERUS FRACTURE;  Surgeon: Eulas Post, MD;  Location: [facility] OR;  Service: Orthopedics;  Laterality: Right;  ORIF RIGHT SUPRACHONDULAR HUMERUS FRACTURE  . Harvest bone graft  08/17/2012    Procedure: HARVEST ILIAC BONE GRAFT;  Surgeon: Eulas Post, MD;  Location: [facility] OR;  Service: Orthopedics;  Laterality: Right;  . Left heart catheterization with coronary angiogram N/A 10/18/2012    Procedure: LEFT HEART CATHETERIZATION WITH CORONARY ANGIOGRAM;  Surgeon: Tonny Bollman, MD;  Location: [facility] CATH LAB;  Service: Cardiovascular;  Laterality: N/A;   Family History  Problem Relation Age of Onset  . Anesthesia problems Neg Hx   . Hypotension Neg Hx   . Malignant hyperthermia Neg Hx   . Pseudochol deficiency Neg Hx   . Hypertension Sister   . Heart attack Mother   . Stroke Mother    Social History  Substance Use Topics  . Smoking status: Never Smoker   . Smokeless tobacco: Never Used  . Alcohol Use: 3.0 oz/week    5 Glasses of wine per week   OB History    Gravida Para Term Preterm AB TAB SAB Ectopic Multiple Living   0 0 0 0 0 0 0 0 0 0      Review of  Systems  Constitutional: Negative for fever and chills.  Respiratory: Negative.  Negative for cough and shortness of breath.   Cardiovascular: Negative.  Negative for chest pain.  Gastrointestinal: Negative.  Negative for vomiting and abdominal pain.  Musculoskeletal: Negative for neck pain.       See HPI.   Skin: Negative.   Neurological: Negative.  Negative for weakness and headaches.      Allergies  Iron; Penicillins; and Sulfa drugs cross reactors  Home Medications   Prior to Admission medications   Medication Sig Start Date End Date Taking? Authorizing Provider  diphenhydrAMINE (SOMINEX) 25 MG tablet Take 25 mg by mouth at bedtime.     Historical Provider, MD  traMADol (ULTRAM) 50 MG tablet Take 1 tablet (50 mg total) by mouth every 6 (six) hours as needed. Patient not taking: Reported on 11/30/2014 11/17/14   11/19/14, MD   BP 135/79 mmHg  Pulse 68  Temp(Src) 98.2 F (36.8 C) (Oral)  Resp 18  SpO2 94% Physical Exam  Constitutional: She is oriented to person, place, and time. She appears well-developed and well-nourished.  HENT:  Head: Normocephalic.  Neck: Normal range of motion. Neck supple.  Cardiovascular: Intact distal pulses.   Pulmonary/Chest: Effort normal.  Abdominal: Soft. Bowel sounds are normal. There is no tenderness. There is no rebound and no guarding.  Genitourinary:  No left CVA tenderness.   Musculoskeletal: Normal range of motion.  FROM LE's. No pain with passive rotation of hips bilaterally. No palpable tenderness.   Neurological: She is alert and oriented to person, place, and time. Coordination normal.  Skin: Skin is warm and dry. No rash noted.  Psychiatric: She has a normal mood and affect.    ED Course  Procedures (including critical care time) Labs Review Labs Reviewed  URINE CULTURE  URINALYSIS, ROUTINE W REFLEX MICROSCOPIC (NOT AT [facility])   Results for orders placed or performed during the hospital encounter of 06/08/15   Urine culture  Result Value Ref Range   Specimen Description URINE, CLEAN CATCH    Special Requests NONE    Culture      8,000 COLONIES/mL INSIGNIFICANT GROWTH Performed at [facility]    Report Status 06/09/2015 FINAL   Urinalysis, Routine w reflex microscopic  Result Value Ref Range   Color, Urine YELLOW YELLOW   APPearance CLEAR CLEAR   Specific Gravity, Urine 1.018 1.005 - 1.030   pH 5.0 5.0 - 8.0   Glucose, UA NEGATIVE NEGATIVE mg/dL   Hgb urine dipstick MODERATE (A) NEGATIVE   Bilirubin Urine NEGATIVE NEGATIVE   Ketones, ur NEGATIVE NEGATIVE mg/dL   Protein, ur NEGATIVE NEGATIVE mg/dL   Urobilinogen, UA 0.2 0.0 - 1.0 mg/dL   Nitrite NEGATIVE NEGATIVE   Leukocytes, UA  NEGATIVE NEGATIVE  Urine microscopic-add on  Result Value Ref Range   RBC / HPF 3-6 <3 RBC/hpf   Urine-Other MUCOUS PRESENT    Dg Hips Bilat With Pelvis 3-4 Views  06/08/2015  CLINICAL DATA:  Initial encounter for fall 3 days ago. Pain in hips. EXAM: DG HIP (WITH OR WITHOUT PELVIS) 3-4V BILAT COMPARISON:  None. FINDINGS: AP view of the pelvis and AP/frog leg views of both hips. L4-5 and L5-S1 degenerative disc disease is suboptimally evaluated. Femoral heads are located. Vascular calcifications. Degenerative sclerosis of both sacroiliac joints. No acute fracture. Scattered areas of heterotopic ossification. IMPRESSION: Degenerative change, without acute osseous finding. Electronically Signed   By: Jeronimo Greaves M.D.   On: 06/08/2015 17:59     Imaging Review No results found. I have personally reviewed and evaluated these images and lab results as part of my medical decision-making.   EKG Interpretation None      MDM   Final diagnoses:  None    1. Fall 2. Hip pain  The patient is observed to be ambulatory in the department with minimal assistance. Pain medication provided. She is evaluated by Dr. Clarene Duke and is felt appropriate for discharge home. No evidence infection. Fall felt to be  mechanical in nature.     Elpidio Anis, PA-C 06/11/15 2301  Laurence Spates, MD 06/12/15 [phone]

## 2015-06-08 NOTE — ED Notes (Addendum)
LISA SMALL [phone]

## 2015-06-08 NOTE — ED Notes (Signed)
Patient transported to X-ray 

## 2015-06-08 NOTE — ED Notes (Signed)
Bed: [facility] Expected date:  Expected time:  Means of arrival:  Comments: EMS/5F/L flank pain/fall

## 2015-06-08 NOTE — ED Notes (Signed)
Attempted to call Marya Landry for reference on patient's paperwork as emergency contact-no answer.

## 2015-06-08 NOTE — ED Notes (Signed)
[facility] called for transport.  

## 2015-06-09 LAB — URINE CULTURE

## 2015-09-24 ENCOUNTER — Ambulatory Visit
Admission: RE | Admit: 2015-09-24 | Discharge: 2015-09-24 | Disposition: A | Discharge status: Home / Self Care | Payer: Medicare Other | Source: Ambulatory Visit | Attending: Nurse Practitioner | Admitting: Nurse Practitioner

## 2015-09-24 ENCOUNTER — Other Ambulatory Visit: Payer: Self-pay | Admitting: Nurse Practitioner

## 2015-09-24 DIAGNOSIS — M79671 Pain in right foot: Secondary | ICD-10-CM

## 2015-12-22 ENCOUNTER — Emergency Department (HOSPITAL_COMMUNITY)
Admission: EM | Admit: 2015-12-22 | Discharge: 2015-12-22 | Disposition: A | Discharge status: Home / Self Care | Payer: Medicare Other | Attending: Emergency Medicine | Admitting: Emergency Medicine

## 2015-12-22 ENCOUNTER — Emergency Department (HOSPITAL_COMMUNITY): Payer: Medicare Other

## 2015-12-22 ENCOUNTER — Encounter (HOSPITAL_COMMUNITY): Payer: Self-pay | Admitting: Emergency Medicine

## 2015-12-22 DIAGNOSIS — F0390 Unspecified dementia without behavioral disturbance: Secondary | ICD-10-CM | POA: Insufficient documentation

## 2015-12-22 DIAGNOSIS — G8929 Other chronic pain: Secondary | ICD-10-CM | POA: Insufficient documentation

## 2015-12-22 DIAGNOSIS — I2510 Atherosclerotic heart disease of native coronary artery without angina pectoris: Secondary | ICD-10-CM | POA: Diagnosis not present

## 2015-12-22 DIAGNOSIS — R1030 Lower abdominal pain, unspecified: Secondary | ICD-10-CM | POA: Insufficient documentation

## 2015-12-22 DIAGNOSIS — Z880 Allergy status to penicillin: Secondary | ICD-10-CM | POA: Diagnosis not present

## 2015-12-22 DIAGNOSIS — Z8739 Personal history of other diseases of the musculoskeletal system and connective tissue: Secondary | ICD-10-CM | POA: Diagnosis not present

## 2015-12-22 DIAGNOSIS — I10 Essential (primary) hypertension: Secondary | ICD-10-CM | POA: Diagnosis not present

## 2015-12-22 DIAGNOSIS — Z8659 Personal history of other mental and behavioral disorders: Secondary | ICD-10-CM | POA: Insufficient documentation

## 2015-12-22 DIAGNOSIS — Z862 Personal history of diseases of the blood and blood-forming organs and certain disorders involving the immune mechanism: Secondary | ICD-10-CM | POA: Insufficient documentation

## 2015-12-22 LAB — URINE MICROSCOPIC-ADD ON
BACTERIA UA: NONE SEEN
Squamous Epithelial / LPF: NONE SEEN
WBC UA: NONE SEEN WBC/hpf (ref 0–5)

## 2015-12-22 LAB — URINALYSIS, ROUTINE W REFLEX MICROSCOPIC
Bilirubin Urine: NEGATIVE
GLUCOSE, UA: NEGATIVE mg/dL
KETONES UR: NEGATIVE mg/dL
LEUKOCYTES UA: NEGATIVE
NITRITE: NEGATIVE
PH: 6 (ref 5.0–8.0)
Protein, ur: NEGATIVE mg/dL
SPECIFIC GRAVITY, URINE: 1.009 (ref 1.005–1.030)

## 2015-12-22 LAB — COMPREHENSIVE METABOLIC PANEL
ALT: 13 U/L — ABNORMAL LOW (ref 14–54)
ANION GAP: 11 (ref 5–15)
AST: 19 U/L (ref 15–41)
Albumin: 4.1 g/dL (ref 3.5–5.0)
Alkaline Phosphatase: 70 U/L (ref 38–126)
BILIRUBIN TOTAL: 0.8 mg/dL (ref 0.3–1.2)
BUN: 15 mg/dL (ref 6–20)
CHLORIDE: 104 mmol/L (ref 101–111)
CO2: 27 mmol/L (ref 22–32)
Calcium: 8.9 mg/dL (ref 8.9–10.3)
Creatinine, Ser: 0.83 mg/dL (ref 0.44–1.00)
Glucose, Bld: 118 mg/dL — ABNORMAL HIGH (ref 65–99)
POTASSIUM: 4 mmol/L (ref 3.5–5.1)
Sodium: 142 mmol/L (ref 135–145)
TOTAL PROTEIN: 7.5 g/dL (ref 6.5–8.1)

## 2015-12-22 LAB — CBC
HEMATOCRIT: 39.3 % (ref 36.0–46.0)
HEMOGLOBIN: 13.1 g/dL (ref 12.0–15.0)
MCH: 29.8 pg (ref 26.0–34.0)
MCHC: 33.3 g/dL (ref 30.0–36.0)
MCV: 89.3 fL (ref 78.0–100.0)
Platelets: 186 10*3/uL (ref 150–400)
RBC: 4.4 MIL/uL (ref 3.87–5.11)
RDW: 14.8 % (ref 11.5–15.5)
WBC: 4.7 10*3/uL (ref 4.0–10.5)

## 2015-12-22 LAB — I-STAT TROPONIN, ED: Troponin i, poc: 0.01 ng/mL (ref 0.00–0.08)

## 2015-12-22 LAB — LIPASE, BLOOD: LIPASE: 21 U/L (ref 11–51)

## 2015-12-22 NOTE — ED Notes (Signed)
Patient presents from [facility] independent living for abdominal pain below the umbilicus x3 days. Minimal distention to same. Denies N/V/D. BM yesterday.  Last VS: 174/80, 95%ra, 90hr, 20resp.

## 2015-12-22 NOTE — ED Notes (Signed)
Patient transported to CT 

## 2015-12-22 NOTE — ED Notes (Signed)
Patient c/o lower back pain that radiates to umbilical area. Denies N/V/D. Denies urinary symptoms. Last BM yesterday, normal for patient.

## 2015-12-22 NOTE — Discharge Instructions (Signed)
Your CT scan showed a L1 (lumbar) compression fracture that is old, but slightly worse than before.  Please follow up with your family doctor.  Get rechecked immediately if you develop any new or worrisome symptoms.     Abdominal Pain, Adult Many things can cause abdominal pain. Usually, abdominal pain is not caused by a disease and will improve without treatment. It can often be observed and treated at home. Your health care provider will do a physical exam and possibly order blood tests and X-rays to help determine the seriousness of your pain. However, in many cases, more time must pass before a clear cause of the pain can be found. Before that point, your health care provider may not know if you need more testing or further treatment. HOME CARE INSTRUCTIONS Monitor your abdominal pain for any changes. The following actions may help to alleviate any discomfort you are experiencing:  Only take over-the-counter or prescription medicines as directed by your health care provider.  Do not take laxatives unless directed to do so by your health care provider.  Try a clear liquid diet (broth, tea, or water) as directed by your health care provider. Slowly move to a bland diet as tolerated. SEEK MEDICAL CARE IF:  You have unexplained abdominal pain.  You have abdominal pain associated with nausea or diarrhea.  You have pain when you urinate or have a bowel movement.  You experience abdominal pain that wakes you in the night.  You have abdominal pain that is worsened or improved by eating food.  You have abdominal pain that is worsened with eating fatty foods.  You have a fever. SEEK IMMEDIATE MEDICAL CARE IF:  Your pain does not go away within 2 hours.  You keep throwing up (vomiting).  Your pain is felt only in portions of the abdomen, such as the right side or the left lower portion of the abdomen.  You pass bloody or black tarry stools. MAKE SURE YOU:  Understand these  instructions.  Will watch your condition.  Will get help right away if you are not doing well or get worse.   This information is not intended to replace advice given to you by your health care provider. Make sure you discuss any questions you have with your health care provider.   Document Released: 05/25/2005 Document Revised: 05/06/2015 Document Reviewed: 04/24/2013 Elsevier Interactive Patient Education [employer]

## 2015-12-22 NOTE — ED Notes (Signed)
Connie Wagner/daughter [phone]

## 2015-12-22 NOTE — ED Provider Notes (Signed)
CSN: [id number]     Arrival date & time 12/22/15  1229 History   First MD Initiated Contact with Patient 12/22/15 1600     Chief Complaint  Patient presents with  . Abdominal Pain     Patient is a 82 y.o. female presenting with abdominal pain. The history is provided by the patient and the EMS personnel. No language interpreter was used.  Abdominal Pain  Connie Wagner is a 82 y.o. female who presents to the Emergency Department complaining of abdominal pain.  Level V caveat due to dementia.  Per report patient described lower abdominal pain for the last 3 days with some distention. Patient denies any current pain but states she was hurting earlier. No reports of fever, nausea, vomiting, diarrhea.  Past Medical History  Diagnosis Date  . Anemia     iron deficient  . Upper GI bleeding 09/14/11    "have had it once before today"  . Migraines     "haven't had one since going thru menopause"  . Depression   . Anxiety   . Rheumatoid arthritis(714.0)   . Chronic lower back pain   . Hypertension   . Dementia     needs redirection freq.  . 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 CAD (coronary artery disease)     a. s/p Inf STEMI 2/14 => LHC 10/18/12: Diffuse distal LM 30%, LAD 50% calcified stenosis just after first septal perforator, diagonal branches with mild 20-30% stenosis, mid RCA 99%. PCI: Promus Premier DES to the mid RCA  . Ischemic cardiomyopathy     a. Echocardiogram 10/18/12: Mild LVH, inferior and inferoseptal AK, posterior HK, EF 45%, grade 1 diastolic dysfunction, aortic sclerosis without stenosis, mild MR, mild LAE, RV systolic function moderately reduced (pacer wire in right ventricle), PASP 32  . Carotid stenosis     a. LifeLine screening done in 2009 with carotid stenosis noted;  b.  Carotid dopplers 3/14: R 0-39%, L 40-59%, repeat 1 year   Past Surgical History  Procedure Laterality Date   . Back surgery    . Joint replacement    . Lumbar disc surgery  09/2002    Hemilaminectomy, L3, complete laminectomy, L4, hemi- semilaminectomy, L5 and right S1, and diskectomy, L5-S1.  Marland Kitchen Total knee arthroplasty  01/2002    right  . Incision and drainage of wound  10/2002    lumbar wound infection  . Knee arthroscopy  01/2001    right  . Hematoma evacuation  10/2002    lumbar  . Lumbar laminectomy  10/2003    for epidural abscess evacuation; L4-5  . Breast lumpectomy      "benign tumor removed"  . Esophagogastroduodenoscopy  09/15/2011    Procedure: ESOPHAGOGASTRODUODENOSCOPY (EGD);  Surgeon: Barrie Folk, MD;  Location: [facility] ENDOSCOPY;  Service: Endoscopy;  Laterality: N/A;  . Orif humerus fracture  08/17/2012    Procedure: OPEN REDUCTION INTERNAL FIXATION (ORIF) DISTAL HUMERUS FRACTURE;  Surgeon: Eulas Post, MD;  Location: [facility] OR;  Service: Orthopedics;  Laterality: Right;  ORIF RIGHT SUPRACHONDULAR HUMERUS FRACTURE  . Harvest bone graft  08/17/2012    Procedure: HARVEST ILIAC BONE GRAFT;  Surgeon: Eulas Post, MD;  Location: [facility] OR;  Service: Orthopedics;  Laterality: Right;  . Left heart catheterization with coronary angiogram N/A 10/18/2012    Procedure: LEFT HEART CATHETERIZATION WITH CORONARY ANGIOGRAM;  Surgeon: Tonny Bollman, MD;  Location: [facility] CATH LAB;  Service:  Cardiovascular;  Laterality: N/A;   Family History  Problem Relation Age of Onset  . Anesthesia problems Neg Hx   . Hypotension Neg Hx   . Malignant hyperthermia Neg Hx   . Pseudochol deficiency Neg Hx   . Hypertension Sister   . Heart attack Mother   . Stroke Mother    Social History  Substance Use Topics  . Smoking status: Never Smoker   . Smokeless tobacco: Never Used  . Alcohol Use: 3.0 oz/week    5 Glasses of wine per week   OB History    Gravida Para Term Preterm AB TAB SAB Ectopic Multiple Living   0 0 0 0 0 0 0 0 0 0      Review of Systems  Unable to perform ROS: Dementia  Gastrointestinal:  Positive for abdominal pain.      Allergies  Iron; Penicillins; and Sulfa drugs cross reactors  Home Medications   Prior to Admission medications   Not on File   BP 159/96 mmHg  Pulse 57  Temp(Src) 98.3 F (36.8 C) (Oral)  Resp 17  Ht 5\' 2"  (1.575 m)  Wt 135 lb (61.236 kg)  BMI 24.69 kg/m2  SpO2 90% Physical Exam  Constitutional: She appears well-developed and well-nourished.  HENT:  Head: Normocephalic and atraumatic.  Cardiovascular: Normal rate and regular rhythm.   No murmur heard. Pulmonary/Chest: Effort normal and breath sounds normal. No respiratory distress.  Abdominal: Soft. Bowel sounds are normal. There is no tenderness. There is no rebound and no guarding.  Musculoskeletal: She exhibits no edema or tenderness.  Neurological: She is alert.  Disoriented to place and time, pleasantly confused. 5 out of 5 strength in bilateral lower extremities  Skin: Skin is warm and dry.  Psychiatric: She has a normal mood and affect. Her behavior is normal.  Nursing note and vitals reviewed.   ED Course  Procedures (including critical care time) Labs Review Labs Reviewed  COMPREHENSIVE METABOLIC PANEL - Abnormal; Notable for the following:    Glucose, Bld 118 (*)    ALT 13 (*)    All other components within normal limits  URINALYSIS, ROUTINE W REFLEX MICROSCOPIC (NOT AT [facility]) - Abnormal; Notable for the following:    Hgb urine dipstick MODERATE (*)    All other components within normal limits  LIPASE, BLOOD  CBC  URINE MICROSCOPIC-ADD ON  Rosezena Sensor, ED    Imaging Review Ct Renal Stone Study  12/22/2015  CLINICAL DATA:  82 year old female with lower abdominal pain. Low back pain radiating to the umbilicus. EXAM: CT ABDOMEN AND PELVIS WITHOUT CONTRAST TECHNIQUE: Multidetector CT imaging of the abdomen and pelvis was performed following the standard protocol without IV contrast. COMPARISON:  No prior abdominal imaging. FINDINGS: Lower chest: Dense coronary  artery calcifications versus coronary stents. Mild nonspecific ground-glass opacities at the lung bases. Scattered linear atelectasis. Liver: No focal lesion allowing for lack contrast. Hepatobiliary: Gallbladder is not well seen due to motion. Fluid-filled gallbladder tentatively identified. No biliary dilatation. No calcified gallstone. Pancreas: Parenchymal atrophy. No ductal dilatation or inflammation. Spleen: Normal. Adrenal glands: No nodule. Kidneys: No hydronephrosis or renal stone. There is a 6 mm hyperdense focus in the lower left kidney. No significant perinephric stranding. Stomach/Bowel: Stomach is distended with ingested contents. Small hiatal hernia. There are no dilated or thickened small bowel loops. Moderate diffuse stool burden throughout the colon. Diverticulosis of the descending and sigmoid colon without diverticulitis. Transverse colon is tortuous. The appendix is not visualized, no pericecal  or right lower quadrant inflammation. Vascular/Lymphatic: No retroperitoneal adenopathy. Abdominal aorta is normal in caliber. Dense atherosclerosis of the abdominal aorta and its branches, no aneurysm. Reproductive: Uterus is atrophic, normal for age. Ovaries quiescent. Bladder: Physiologically distended.  No wall thickening. Other: No free air, free fluid, or intra-abdominal fluid collection. Fat within the right inguinal canal. Musculoskeletal: Compression deformity of L1 has progressed from lumbar spine radiographs March 2016. Compression deformities of L2 and in the lower thoracic spine are grossly unchanged. There is multilevel degenerative change. Soft tissue calcifications about both flanks may be secondary to injections. IMPRESSION: 1. No renal stones or obstructive uropathy. No acute abnormality in the abdomen/pelvis. 2. Progressive compression deformity of L1 since most recent comparison imaging March 2016. Additional compression deformities are grossly unchanged. There is multilevel  degenerative change throughout the spine. 3. Small subcentimeter hyperdense focus in the lower left kidney. Further evaluation would be difficult due to small size. This is likely hyperdense cyst. 4. Atherosclerosis. Electronically Signed   By: Rubye Oaks M.D.   On: 12/22/2015 17:36   I have personally reviewed and evaluated these images and lab results as part of my medical decision-making.   EKG Interpretation None      MDM   Final diagnoses:  Lower abdominal pain    Patient with history of dementia here with lower abdominal pain, now resolved. CT scan with evidence of compression fracture of L1. Patient without any current pain. Discussed with daughter home care for compression fracture, outpatient follow-up, return precautions.  Tilden Fossa, MD 12/22/15 2340

## 2016-01-16 ENCOUNTER — Emergency Department (HOSPITAL_COMMUNITY)
Admission: EM | Admit: 2016-01-16 | Discharge: 2016-01-16 | Disposition: A | Discharge status: Home / Self Care | Payer: Medicare Other | Attending: Emergency Medicine | Admitting: Emergency Medicine

## 2016-01-16 ENCOUNTER — Emergency Department (HOSPITAL_COMMUNITY): Payer: Medicare Other

## 2016-01-16 ENCOUNTER — Encounter (HOSPITAL_COMMUNITY): Payer: Self-pay | Admitting: Emergency Medicine

## 2016-01-16 DIAGNOSIS — F329 Major depressive disorder, single episode, unspecified: Secondary | ICD-10-CM | POA: Insufficient documentation

## 2016-01-16 DIAGNOSIS — Z96651 Presence of right artificial knee joint: Secondary | ICD-10-CM | POA: Insufficient documentation

## 2016-01-16 DIAGNOSIS — Y939 Activity, unspecified: Secondary | ICD-10-CM | POA: Diagnosis not present

## 2016-01-16 DIAGNOSIS — F0391 Unspecified dementia with behavioral disturbance: Secondary | ICD-10-CM | POA: Diagnosis present

## 2016-01-16 DIAGNOSIS — W19XXXA Unspecified fall, initial encounter: Secondary | ICD-10-CM | POA: Diagnosis not present

## 2016-01-16 DIAGNOSIS — I10 Essential (primary) hypertension: Secondary | ICD-10-CM | POA: Diagnosis not present

## 2016-01-16 DIAGNOSIS — M069 Rheumatoid arthritis, unspecified: Secondary | ICD-10-CM | POA: Insufficient documentation

## 2016-01-16 DIAGNOSIS — Y999 Unspecified external cause status: Secondary | ICD-10-CM | POA: Insufficient documentation

## 2016-01-16 DIAGNOSIS — Y929 Unspecified place or not applicable: Secondary | ICD-10-CM | POA: Insufficient documentation

## 2016-01-16 DIAGNOSIS — N390 Urinary tract infection, site not specified: Secondary | ICD-10-CM | POA: Diagnosis not present

## 2016-01-16 DIAGNOSIS — I2510 Atherosclerotic heart disease of native coronary artery without angina pectoris: Secondary | ICD-10-CM | POA: Insufficient documentation

## 2016-01-16 DIAGNOSIS — Z951 Presence of aortocoronary bypass graft: Secondary | ICD-10-CM | POA: Insufficient documentation

## 2016-01-16 LAB — CBC WITH DIFFERENTIAL/PLATELET
BASOS ABS: 0 10*3/uL (ref 0.0–0.1)
Basophils Relative: 1 %
EOS PCT: 3 %
Eosinophils Absolute: 0.2 10*3/uL (ref 0.0–0.7)
HEMATOCRIT: 36.8 % (ref 36.0–46.0)
Hemoglobin: 12 g/dL (ref 12.0–15.0)
LYMPHS PCT: 16 %
Lymphs Abs: 0.8 10*3/uL (ref 0.7–4.0)
MCH: 29.8 pg (ref 26.0–34.0)
MCHC: 32.6 g/dL (ref 30.0–36.0)
MCV: 91.3 fL (ref 78.0–100.0)
Monocytes Absolute: 0.8 10*3/uL (ref 0.1–1.0)
Monocytes Relative: 15 %
NEUTROS ABS: 3.2 10*3/uL (ref 1.7–7.7)
Neutrophils Relative %: 65 %
PLATELETS: 170 10*3/uL (ref 150–400)
RBC: 4.03 MIL/uL (ref 3.87–5.11)
RDW: 15.7 % — ABNORMAL HIGH (ref 11.5–15.5)
WBC: 5 10*3/uL (ref 4.0–10.5)

## 2016-01-16 LAB — URINALYSIS, ROUTINE W REFLEX MICROSCOPIC
BILIRUBIN URINE: NEGATIVE
Glucose, UA: NEGATIVE mg/dL
KETONES UR: NEGATIVE mg/dL
LEUKOCYTES UA: NEGATIVE
NITRITE: NEGATIVE
PH: 5 (ref 5.0–8.0)
PROTEIN: NEGATIVE mg/dL
Specific Gravity, Urine: 1.023 (ref 1.005–1.030)

## 2016-01-16 LAB — URINE MICROSCOPIC-ADD ON
SQUAMOUS EPITHELIAL / LPF: NONE SEEN
WBC UA: NONE SEEN WBC/hpf (ref 0–5)

## 2016-01-16 LAB — COMPREHENSIVE METABOLIC PANEL
ALBUMIN: 3.7 g/dL (ref 3.5–5.0)
ALT: 11 U/L — AB (ref 14–54)
AST: 19 U/L (ref 15–41)
Alkaline Phosphatase: 72 U/L (ref 38–126)
Anion gap: 8 (ref 5–15)
BUN: 26 mg/dL — AB (ref 6–20)
CHLORIDE: 108 mmol/L (ref 101–111)
CO2: 24 mmol/L (ref 22–32)
CREATININE: 0.98 mg/dL (ref 0.44–1.00)
Calcium: 9 mg/dL (ref 8.9–10.3)
GFR calc Af Amer: 59 mL/min — ABNORMAL LOW (ref >60)
GFR, EST NON AFRICAN AMERICAN: 51 mL/min — AB (ref >60)
GLUCOSE: 122 mg/dL — AB (ref 65–99)
Potassium: 4.1 mmol/L (ref 3.5–5.1)
Sodium: 140 mmol/L (ref 135–145)
Total Bilirubin: 0.4 mg/dL (ref 0.3–1.2)
Total Protein: 6.9 g/dL (ref 6.5–8.1)

## 2016-01-16 LAB — I-STAT CG4 LACTIC ACID, ED: Lactic Acid, Venous: 2.18 mmol/L (ref 0.5–2.0)

## 2016-01-16 NOTE — ED Notes (Signed)
Informed Dr. Blinda Leatherwood of lactic acid of 2.18 @ 0132 by QA

## 2016-01-16 NOTE — ED Notes (Signed)
Pt getting OOB  Ambulated to restroom and back with assist   Pt confused  Instructed pt to stay in bed as so she will not fall

## 2016-01-16 NOTE — Discharge Instructions (Signed)

## 2016-01-16 NOTE — ED Notes (Signed)
Patient transported to CT 

## 2016-01-16 NOTE — ED Notes (Signed)
[facility] called for transport to facility  Pt sitting at nursing station in a wheelchair folding wash clothes

## 2016-01-16 NOTE — ED Notes (Signed)
Pt continually attempting to get out of bed, pt placed in hallway for better observation

## 2016-01-16 NOTE — ED Notes (Signed)
Pt comes from [facility]  Pt had a fall and family states she has been a little confused today  Pt has a hx of recurrent UTIs  Pt has a low grade fever and her urine has a strong odor

## 2016-01-16 NOTE — ED Provider Notes (Signed)
CSN: [id number]     Arrival date & time 01/16/16  0013 History  By signing my name below, I, Placido Sou, attest that this documentation has been prepared under the direction and in the presence of Gilda Crease, MD. Electronically Signed: Placido Sou, ED Scribe. 01/16/2016. 12:46 AM.   Chief Complaint  Patient presents with  . Fall  . Urinary Tract Infection   The history is provided by the patient, medical records and a relative. The history is limited by the condition of the patient. No language interpreter was used.    LEVEL V CAVEAT: DEMENTIA   HPI Comments: Connie Wagner is a 82 y.o. female with a PMHx of dementia who resides at [facility] presents to the Emergency Department by EMS due to increased confusion x 1 day. Per nursing note, her family states that she has become increasingly confused compared to her baseline dementia. Per nursing note, pt has a PMHx of recurrent UTIs and her urine has a strong odor. Pt states that "she died tonight" and "I don't know what to say".  Past Medical History  Diagnosis Date  . Anemia     iron deficient  . Upper GI bleeding 09/14/11    "have had it once before today"  . Migraines     "haven't had one since going thru menopause"  . Depression   . Anxiety   . Rheumatoid arthritis(714.0)   . Chronic lower back pain   . Hypertension   . Dementia     needs redirection freq.  . 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 CAD (coronary artery disease)     a. s/p Inf STEMI 2/14 => LHC 10/18/12: Diffuse distal LM 30%, LAD 50% calcified stenosis just after first septal perforator, diagonal branches with mild 20-30% stenosis, mid RCA 99%. PCI: Promus Premier DES to the mid RCA  . Ischemic cardiomyopathy     a. Echocardiogram 10/18/12: Mild LVH, inferior and inferoseptal AK, posterior HK, EF 45%, grade 1 diastolic dysfunction, aortic sclerosis  without stenosis, mild MR, mild LAE, RV systolic function moderately reduced (pacer wire in right ventricle), PASP 32  . Carotid stenosis     a. LifeLine screening done in 2009 with carotid stenosis noted;  b.  Carotid dopplers 3/14: R 0-39%, L 40-59%, repeat 1 year   Past Surgical History  Procedure Laterality Date  . Back surgery    . Joint replacement    . Lumbar disc surgery  09/2002    Hemilaminectomy, L3, complete laminectomy, L4, hemi- semilaminectomy, L5 and right S1, and diskectomy, L5-S1.  Marland Kitchen Total knee arthroplasty  01/2002    right  . Incision and drainage of wound  10/2002    lumbar wound infection  . Knee arthroscopy  01/2001    right  . Hematoma evacuation  10/2002    lumbar  . Lumbar laminectomy  10/2003    for epidural abscess evacuation; L4-5  . Breast lumpectomy      "benign tumor removed"  . Esophagogastroduodenoscopy  09/15/2011    Procedure: ESOPHAGOGASTRODUODENOSCOPY (EGD);  Surgeon: Barrie Folk, MD;  Location: [facility] ENDOSCOPY;  Service: Endoscopy;  Laterality: N/A;  . Orif humerus fracture  08/17/2012    Procedure: OPEN REDUCTION INTERNAL FIXATION (ORIF) DISTAL HUMERUS FRACTURE;  Surgeon: Eulas Post, MD;  Location: [facility] OR;  Service: Orthopedics;  Laterality: Right;  ORIF RIGHT SUPRACHONDULAR HUMERUS FRACTURE  . Harvest bone graft  08/17/2012    Procedure: HARVEST ILIAC BONE GRAFT;  Surgeon: Eulas Post, MD;  Location: [facility] OR;  Service: Orthopedics;  Laterality: Right;  . Left heart catheterization with coronary angiogram N/A 10/18/2012    Procedure: LEFT HEART CATHETERIZATION WITH CORONARY ANGIOGRAM;  Surgeon: Tonny Bollman, MD;  Location: [facility] CATH LAB;  Service: Cardiovascular;  Laterality: N/A;   Family History  Problem Relation Age of Onset  . Anesthesia problems Neg Hx   . Hypotension Neg Hx   . Malignant hyperthermia Neg Hx   . Pseudochol deficiency Neg Hx   . Hypertension Sister   . Heart attack Mother   . Stroke Mother    Social History  Substance  Use Topics  . Smoking status: Never Smoker   . Smokeless tobacco: Never Used  . Alcohol Use: 3.0 oz/week    5 Glasses of wine per week   OB History    Gravida Para Term Preterm AB TAB SAB Ectopic Multiple Living   0 0 0 0 0 0 0 0 0 0      Review of Systems  Unable to perform ROS: Dementia  All other systems reviewed and are negative.  Allergies  Iron; Penicillins; and Sulfa drugs cross reactors  Home Medications   Prior to Admission medications   Not on File   BP 144/71 mmHg  Pulse 92  Temp(Src) 97.5 F (36.4 C) (Oral)  Resp 18  SpO2 94%    Physical Exam  Constitutional: She is oriented to person, place, and time. She appears well-developed and well-nourished. No distress.  HENT:  Head: Normocephalic and atraumatic.  Right Ear: Hearing normal.  Left Ear: Hearing normal.  Nose: Nose normal.  Mouth/Throat: Oropharynx is clear and moist and mucous membranes are normal.  Eyes: Conjunctivae and EOM are normal. Pupils are equal, round, and reactive to light.  Neck: Normal range of motion. Neck supple.  Cardiovascular: Regular rhythm, S1 normal and S2 normal.  Exam reveals no gallop and no friction rub.   No murmur heard. Pulmonary/Chest: Effort normal and breath sounds normal. No respiratory distress. She exhibits no tenderness.  Abdominal: Soft. Normal appearance and bowel sounds are normal. There is no hepatosplenomegaly. There is no tenderness. There is no rebound, no guarding, no tenderness at McBurney's point and negative Murphy's sign. No hernia.  Musculoskeletal: Normal range of motion.  Neurological: She is alert and oriented to person, place, and time. She has normal strength. No cranial nerve deficit or sensory deficit. Coordination normal. GCS eye subscore is 4. GCS verbal subscore is 5. GCS motor subscore is 6.  Disoriented and confused  Skin: Skin is warm, dry and intact. No rash noted. No cyanosis.  Psychiatric: She has a normal mood and affect. Her speech is  normal and behavior is normal. Thought content normal.  Nursing note and vitals reviewed.   ED Course  Procedures  DIAGNOSTIC STUDIES: Oxygen Saturation is 94% on RA, normal by my interpretation.    COORDINATION OF CARE: 12:30 AM Determined next steps based on known history and physical evaluation.   Labs Review Labs Reviewed  CBC WITH DIFFERENTIAL/PLATELET - Abnormal; Notable for the following:    RDW 15.7 (*)    All other components within normal limits  COMPREHENSIVE METABOLIC PANEL - Abnormal; Notable for the following:    Glucose, Bld 122 (*)    BUN 26 (*)    ALT 11 (*)    GFR calc non Af Amer 51 (*)    GFR calc Af  59 (*)    All other components within normal limits  URINALYSIS, ROUTINE W REFLEX MICROSCOPIC (NOT AT [facility]) - Abnormal; Notable for the following:    Hgb urine dipstick MODERATE (*)    All other components within normal limits  URINE MICROSCOPIC-ADD ON - Abnormal; Notable for the following:    Bacteria, UA RARE (*)    All other components within normal limits  I-STAT CG4 LACTIC ACID, ED - Abnormal; Notable for the following:    Lactic Acid, Venous 2.18 (*)    All other components within normal limits  URINE CULTURE    Imaging Review Ct Head Wo Contrast  01/16/2016  CLINICAL DATA:  82 year old female with fall EXAM: CT HEAD WITHOUT CONTRAST CT CERVICAL SPINE WITHOUT CONTRAST TECHNIQUE: Multidetector CT imaging of the head and cervical spine was performed following the standard protocol without intravenous contrast. Multiplanar CT image reconstructions of the cervical spine were also generated. COMPARISON:  Head CT dated 11/17/2014 FINDINGS: CT HEAD FINDINGS There is stable prominence of the ventricles and sulci compatible with age-related atrophy. Periventricular and deep white matter chronic microvascular ischemic changes noted. There is no acute intracranial hemorrhage. No mass effect or midline shift noted. The visualized paranasal sinuses and mastoid air  cells are clear. The calvarium is intact. CT CERVICAL SPINE FINDINGS There is no acute fracture or subluxation of the cervical spine.There is multilevel degenerative changes of the cervical spine and facet hypertrophy. There is endplate irregularity with disc space narrowing at C5-C6 and C6-C7. There is grade 1 C6-C7 retrolisthesis. There is minimal compression deformity of the superior endplate of the T3, likely chronic.The odontoid and spinous processes are intact.There is normal anatomic alignment of the C1-C2 lateral masses. The visualized soft tissues appear unremarkable. Bilateral carotid bulb atherosclerotic plaques. IMPRESSION: No acute intracranial hemorrhage. Stable age-related atrophy and chronic microvascular ischemic changes. No acute/traumatic cervical spine pathology. Electronically Signed   By: Elgie Collard M.D.   On: 01/16/2016 02:27   Ct Cervical Spine Wo Contrast  01/16/2016  CLINICAL DATA:  82 year old female with fall EXAM: CT HEAD WITHOUT CONTRAST CT CERVICAL SPINE WITHOUT CONTRAST TECHNIQUE: Multidetector CT imaging of the head and cervical spine was performed following the standard protocol without intravenous contrast. Multiplanar CT image reconstructions of the cervical spine were also generated. COMPARISON:  Head CT dated 11/17/2014 FINDINGS: CT HEAD FINDINGS There is stable prominence of the ventricles and sulci compatible with age-related atrophy. Periventricular and deep white matter chronic microvascular ischemic changes noted. There is no acute intracranial hemorrhage. No mass effect or midline shift noted. The visualized paranasal sinuses and mastoid air cells are clear. The calvarium is intact. CT CERVICAL SPINE FINDINGS There is no acute fracture or subluxation of the cervical spine.There is multilevel degenerative changes of the cervical spine and facet hypertrophy. There is endplate irregularity with disc space narrowing at C5-C6 and C6-C7. There is grade 1 C6-C7  retrolisthesis. There is minimal compression deformity of the superior endplate of the T3, likely chronic.The odontoid and spinous processes are intact.There is normal anatomic alignment of the C1-C2 lateral masses. The visualized soft tissues appear unremarkable. Bilateral carotid bulb atherosclerotic plaques. IMPRESSION: No acute intracranial hemorrhage. Stable age-related atrophy and chronic microvascular ischemic changes. No acute/traumatic cervical spine pathology. Electronically Signed   By: Elgie Collard M.D.   On: 01/16/2016 02:27   I have personally reviewed and evaluated these images and lab results as part of my medical decision-making.   EKG Interpretation None  MDM   Final diagnoses:  None  Dementia  Patient presents to the emergency department for evaluation of confusion. Patient does have a history of dementia but family thought she was more confused than usual today. They're concerned about the possibility of urinary tract infection, as patient does have a history of recurring UTI. At arrival to the ER, patient was pleasant and without distress, but did appear to be disoriented. Reviewing records from previous visits reveals that she has always been disoriented and confused secondary to chronic dementia. Her workup today has been unremarkable. No sign of infection. No sign of intracranial abnormality. I do not find any acute medical condition that requires hospitalization at this time.  I personally performed the services described in this documentation, which was scribed in my presence. The recorded information has been reviewed and is accurate.    Gilda Crease, MD 01/16/16 [phone]

## 2016-01-16 NOTE — ED Notes (Signed)
Pt OOB ambulating in room and hallway  Assisted back to bed without incident

## 2016-01-17 LAB — URINE CULTURE: CULTURE: NO GROWTH

## 2016-09-16 ENCOUNTER — Emergency Department (HOSPITAL_COMMUNITY)
Admission: EM | Admit: 2016-09-16 | Discharge: 2016-09-16 | Disposition: A | Discharge status: Home / Self Care | Payer: Medicare HMO | Attending: Physician Assistant | Admitting: Physician Assistant

## 2016-09-16 ENCOUNTER — Emergency Department (HOSPITAL_COMMUNITY): Payer: Medicare HMO

## 2016-09-16 ENCOUNTER — Encounter (HOSPITAL_COMMUNITY): Payer: Self-pay | Admitting: Emergency Medicine

## 2016-09-16 DIAGNOSIS — F0390 Unspecified dementia without behavioral disturbance: Secondary | ICD-10-CM

## 2016-09-16 DIAGNOSIS — I2510 Atherosclerotic heart disease of native coronary artery without angina pectoris: Secondary | ICD-10-CM | POA: Diagnosis not present

## 2016-09-16 DIAGNOSIS — M542 Cervicalgia: Secondary | ICD-10-CM | POA: Diagnosis not present

## 2016-09-16 DIAGNOSIS — I252 Old myocardial infarction: Secondary | ICD-10-CM | POA: Diagnosis not present

## 2016-09-16 DIAGNOSIS — I110 Hypertensive heart disease with heart failure: Secondary | ICD-10-CM | POA: Diagnosis not present

## 2016-09-16 DIAGNOSIS — R51 Headache: Secondary | ICD-10-CM | POA: Diagnosis not present

## 2016-09-16 DIAGNOSIS — Z96651 Presence of right artificial knee joint: Secondary | ICD-10-CM | POA: Diagnosis not present

## 2016-09-16 DIAGNOSIS — R69 Illness, unspecified: Secondary | ICD-10-CM | POA: Diagnosis not present

## 2016-09-16 DIAGNOSIS — I5021 Acute systolic (congestive) heart failure: Secondary | ICD-10-CM | POA: Insufficient documentation

## 2016-09-16 DIAGNOSIS — R0789 Other chest pain: Secondary | ICD-10-CM | POA: Diagnosis not present

## 2016-09-16 NOTE — ED Notes (Signed)
Patient undressed, in gown, on monitor, continuous pulse oximetry and blood pressure cuff 

## 2016-09-16 NOTE — ED Notes (Signed)
Patient drinking water without difficulty.

## 2016-09-16 NOTE — ED Provider Notes (Signed)
[facility]-EMERGENCY DEPT Provider Note   CSN: [id number] Arrival date & time: 09/16/16  0802     History   Chief Complaint Chief Complaint  Patient presents with  . Neck Pain    HPI Connie Wagner is a 83 y.o. female.  HPI   Patient is an 83 year old female presenting him independent living. Patient is unable to tell me any of her complaints right now. When asked why she here she says "I didn't expect you to ask me that".  Level V caveat dementia.  Past Medical History:  Diagnosis Date  . Anemia    iron deficient  . Anxiety   . CAD (coronary artery disease)    a. s/p Inf STEMI 2/14 => LHC 10/18/12: Diffuse distal LM 30%, LAD 50% calcified stenosis just after first septal perforator, diagonal branches with mild 20-30% stenosis, mid RCA 99%. PCI: Promus Premier DES to the mid RCA  . Carotid stenosis    a. LifeLine screening done in 2009 with carotid stenosis noted;  b.  Carotid dopplers 3/14: R 0-39%, L 40-59%, repeat 1 year  . Chronic lower back pain   . 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 Dementia    needs redirection freq.  . Depression   . Hypertension   . Ischemic cardiomyopathy    a. Echocardiogram 10/18/12: Mild LVH, inferior and inferoseptal AK, posterior HK, EF 45%, grade 1 diastolic dysfunction, aortic sclerosis without stenosis, mild MR, mild LAE, RV systolic function moderately reduced (pacer wire in right ventricle), PASP 32  . Migraines    "haven't had one since going thru menopause"  . Rheumatoid arthritis(714.0)   . Upper GI bleeding 09/14/11   "have had it once before today"    Patient Active Problem List   Diagnosis Date Noted  . Physical deconditioning 10/23/2012  . Cardiogenic shock (HCC) 10/23/2012  . Wenckebach second degree AV block 10/20/2012  . Complete heart block (HCC) 10/19/2012  . Acute renal failure (HCC) 10/19/2012  . Acute lower UTI 10/19/2012  . Acute  systolic CHF (congestive heart failure) (HCC) 10/19/2012  . Normocytic anemia 10/19/2012  . S/P drug eluting coronary stent placement 10/19/2012  . Coronary artery disease 10/19/2012  . Acute MI, inferior wall, initial episode of care (HCC) 10/18/2012  . Urinary tract infection, E. coli 08/21/2012  . Hypertension   . Dementia   . Depression   . Prepyloric ulcer 09/15/2011  . Hypotension due to blood loss 09/15/2011  . Leukocytosis 09/15/2011  . Extrarenal azotemia 09/15/2011  . GI bleeding 09/14/2011  . Anemia associated with acute blood loss 09/14/2011  . Tachycardia 09/14/2011  . HTN (hypertension) 09/14/2011  . Anxiety 09/14/2011  . ETOH abuse 09/14/2011  . 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 09/14/2011    Past Surgical History:  Procedure Laterality Date  . BACK SURGERY    . BREAST LUMPECTOMY     "benign tumor removed"  . ESOPHAGOGASTRODUODENOSCOPY  09/15/2011   Procedure: ESOPHAGOGASTRODUODENOSCOPY (EGD);  Surgeon: Barrie Folk, MD;  Location: [facility] ENDOSCOPY;  Service: Endoscopy;  Laterality: N/A;  . HARVEST BONE GRAFT  08/17/2012   Procedure: HARVEST ILIAC BONE GRAFT;  Surgeon: Eulas Post, MD;  Location: [facility] OR;  Service: Orthopedics;  Laterality: Right;  . HEMATOMA EVACUATION  10/2002   lumbar  . INCISION AND DRAINAGE OF WOUND  10/2002   lumbar wound infection  . JOINT REPLACEMENT    .  KNEE ARTHROSCOPY  01/2001   right  . LEFT HEART CATHETERIZATION WITH CORONARY ANGIOGRAM N/A 10/18/2012   Procedure: LEFT HEART CATHETERIZATION WITH CORONARY ANGIOGRAM;  Surgeon: Tonny Bollman, MD;  Location[facility] C[facility] LAB;  Service: Cardiovascular;  Laterality: N/A;  . LUMBAR DISC SURGERY  09/2002   Hemilaminectomy, L3, complete laminectomy, L4, hemi- semilaminectomy, L5 and right S1, and diskectomy, L5-S1.  Marland Kitchen LUMBAR LAMINECTOMY  10/2003   for epidural abscess evacuation; L4-5  . ORIF HUMERUS FRACTURE  08/17/2012    Procedure: OPEN REDUCTION INTERNAL FIXATION (ORIF) DISTAL HUMERUS FRACTURE;  Surgeon: Eulas Post, MD;  Location: [facility] OR;  Service: Orthopedics;  Laterality: Right;  ORIF RIGHT SUPRACHONDULAR HUMERUS FRACTURE  . TOTAL KNEE ARTHROPLASTY  01/2002   right    OB History    Gravida Para Term Preterm AB Living   0 0 0 0 0 0   SAB TAB Ectopic Multiple Live Births   0 0 0 0         Home Medications    Prior to Admission medications   Not on File    Family History Family History  Problem Relation Age of Onset  . Heart attack Mother   . Stroke Mother   . Hypertension Sister   . Anesthesia problems Neg Hx   . Hypotension Neg Hx   . Malignant hyperthermia Neg Hx   . Pseudochol deficiency Neg Hx     Social History Social History  Substance Use Topics  . Smoking status: Never Smoker  . Smokeless tobacco: Never Used  . Alcohol use 3.0 oz/week    5 Glasses of wine per week     Allergies   Iron; Penicillins; and Sulfa drugs cross reactors   Review of Systems Review of Systems  Unable to perform ROS: Dementia  Constitutional: Negative for activity change.  Respiratory: Negative for shortness of breath.   Cardiovascular: Negative for chest pain.  Gastrointestinal: Negative for abdominal pain.     Physical Exam Updated Vital Signs BP 172/77 (BP Location: Right Arm)   Pulse 81   Temp 98.1 F (36.7 C) (Oral)   Resp 18   SpO2 100%   Physical Exam  Constitutional: She appears well-developed and well-nourished. No distress.  HENT:  Head: Normocephalic and atraumatic.  Eyes: Conjunctivae are normal.  Neck: Neck supple.  Cardiovascular: Normal rate and regular rhythm.   No murmur heard. Pulmonary/Chest: Effort normal and breath sounds normal. No respiratory distress.  Abdominal: Soft. There is no tenderness.  Musculoskeletal: She exhibits no edema.  Neurological: She is alert.  Oriented to self only.  Equal strength bilaterally upper and lower extremities  negative pronator drift. Normal sensation bilaterally. Speech comprehensible, no slurring. Facial nerve tested and appears grossly normal. Alert and oriented 3.   Skin: Skin is warm and dry.  Psychiatric: She has a normal mood and affect.  Nursing note and vitals reviewed.    ED Treatments / Results  Labs (all labs ordered are listed, but only abnormal results are displayed) Labs Reviewed - No data to display  EKG  EKG Interpretation  Date/Time:  Friday September 16 2016 08:09:11 EST Ventricular Rate:  73 PR Interval:    QRS Duration: 112 QT Interval:  418 QTC Calculation: 461 R Axis:   -17 Text Interpretation:  Atrial flutter with predominant 3:1 AV block RSR' in V1 or V2, probably normal variant Inferior infarct, old Lateral leads are also involved No significant change since last tracing Confirmed by Kandis Mannan ([id number]) on  09/16/2016 8:29:14 AM       Radiology No results found.  Procedures Procedures (including critical care time)  Medications Ordered in ED Medications - No data to display   Initial Impression / Assessment and Plan / ED Course  I have reviewed the triage vital signs and the nursing notes.  Pertinent labs & imaging results that were available during my care of the patient were reviewed by me and considered in my medical decision making (see chart for details).     Patient is a pleasant elderly female presenting for unknown reasons. Patient is not able to tell me why she is here. I called her living facilities. Reportedly she called them and said that she had some neck pain. However today on physical exam she is moving her neck normally. I asked her if she has any pain she says no. Patient is not a reliable historian. I'm concerned about her living in independent living. [facility] that I'm concerned about her living alone. I talked to her daughter, Misty Stanley small about my concerns. Given that she had a head neck pain  will get CTs. However do not think we should pursue any further workup given that she has no complaint at this time. Patient's daughter reports that she has full services at home. She also reports that she's working on getting her into a different living environment.  Final Clinical Impressions(s) / ED Diagnoses   Final diagnoses:  None    New Prescriptions New Prescriptions   No medications on file     Augustus Zurawski Randall An, MD 09/16/16 1034

## 2016-09-16 NOTE — ED Notes (Signed)
The patient states "I feel terrible", when asked to elaborate the patient states "I'll have to think about that."

## 2016-09-16 NOTE — ED Notes (Signed)
Called patients daughter to pick up patient.

## 2016-09-16 NOTE — Discharge Instructions (Signed)
We're concerned about Connie Wagner living on her own. We think it would be best if she had as much help as possible.

## 2016-09-16 NOTE — ED Notes (Signed)
Patient ambulated independently to restroom without difficulty.

## 2016-09-16 NOTE — ED Notes (Signed)
Patient has returned from bathroom; placed back on monitor, continuous pulse oximetry and blood pressure cuff 

## 2016-09-16 NOTE — ED Notes (Signed)
Patient transported to CT 

## 2016-09-16 NOTE — ED Notes (Signed)
Assisted patient to the bathroom; patient ambulated well without any difficulty or distress with only stand-by assistance

## 2016-09-16 NOTE — ED Notes (Signed)
Assisted patient to the bathroom. 

## 2016-09-16 NOTE — ED Triage Notes (Addendum)
Patient from [facility] for neck pain.  Patient reports a dull ache in her neck when she woke up this morning.  Patient is alert and oriented to self and situation but unable to state the year and location.  Patient is in no apparent distress at this time.

## 2016-09-16 NOTE — ED Notes (Signed)
Patient has returned from the bathroom; placed back on monitor, continuous pulse oximetry and blood pressure cuff 

## 2016-10-24 DIAGNOSIS — I10 Essential (primary) hypertension: Secondary | ICD-10-CM | POA: Diagnosis not present

## 2016-10-24 DIAGNOSIS — G301 Alzheimer's disease with late onset: Secondary | ICD-10-CM | POA: Diagnosis not present

## 2016-10-24 DIAGNOSIS — R69 Illness, unspecified: Secondary | ICD-10-CM | POA: Diagnosis not present

## 2017-01-25 DIAGNOSIS — R69 Illness, unspecified: Secondary | ICD-10-CM | POA: Diagnosis not present

## 2017-01-25 DIAGNOSIS — I10 Essential (primary) hypertension: Secondary | ICD-10-CM | POA: Diagnosis not present

## 2017-01-25 DIAGNOSIS — G301 Alzheimer's disease with late onset: Secondary | ICD-10-CM | POA: Diagnosis not present

## 2017-03-07 DIAGNOSIS — Z111 Encounter for screening for respiratory tuberculosis: Secondary | ICD-10-CM | POA: Diagnosis not present

## 2017-04-26 DIAGNOSIS — Z6831 Body mass index (BMI) 31.0-31.9, adult: Secondary | ICD-10-CM | POA: Diagnosis not present

## 2017-04-26 DIAGNOSIS — G301 Alzheimer's disease with late onset: Secondary | ICD-10-CM | POA: Diagnosis not present

## 2017-04-26 DIAGNOSIS — G4700 Insomnia, unspecified: Secondary | ICD-10-CM | POA: Diagnosis not present

## 2017-04-26 DIAGNOSIS — R69 Illness, unspecified: Secondary | ICD-10-CM | POA: Diagnosis not present

## 2017-04-26 DIAGNOSIS — E669 Obesity, unspecified: Secondary | ICD-10-CM | POA: Diagnosis not present

## 2017-04-26 DIAGNOSIS — I10 Essential (primary) hypertension: Secondary | ICD-10-CM | POA: Diagnosis not present

## 2017-07-04 ENCOUNTER — Emergency Department (HOSPITAL_COMMUNITY): Payer: Medicare HMO

## 2017-07-04 ENCOUNTER — Encounter (HOSPITAL_COMMUNITY): Payer: Self-pay

## 2017-07-04 ENCOUNTER — Inpatient Hospital Stay (HOSPITAL_COMMUNITY): Payer: Medicare HMO

## 2017-07-04 ENCOUNTER — Inpatient Hospital Stay (HOSPITAL_COMMUNITY)
Admission: EM | Admit: 2017-07-04 | Discharge: 2017-07-06 | DRG: 062 | Disposition: A | Discharge status: Nursing Home (Includes ICF) | Payer: Medicare HMO | Attending: Neurology | Admitting: Neurology

## 2017-07-04 DIAGNOSIS — I2510 Atherosclerotic heart disease of native coronary artery without angina pectoris: Secondary | ICD-10-CM | POA: Diagnosis present

## 2017-07-04 DIAGNOSIS — E785 Hyperlipidemia, unspecified: Secondary | ICD-10-CM | POA: Diagnosis present

## 2017-07-04 DIAGNOSIS — Z888 Allergy status to other drugs, medicaments and biological substances status: Secondary | ICD-10-CM

## 2017-07-04 DIAGNOSIS — I63512 Cerebral infarction due to unspecified occlusion or stenosis of left middle cerebral artery: Secondary | ICD-10-CM | POA: Diagnosis not present

## 2017-07-04 DIAGNOSIS — G301 Alzheimer's disease with late onset: Secondary | ICD-10-CM

## 2017-07-04 DIAGNOSIS — G8929 Other chronic pain: Secondary | ICD-10-CM | POA: Diagnosis not present

## 2017-07-04 DIAGNOSIS — Z955 Presence of coronary angioplasty implant and graft: Secondary | ICD-10-CM

## 2017-07-04 DIAGNOSIS — Z981 Arthrodesis status: Secondary | ICD-10-CM | POA: Diagnosis not present

## 2017-07-04 DIAGNOSIS — F05 Delirium due to known physiological condition: Secondary | ICD-10-CM | POA: Diagnosis present

## 2017-07-04 DIAGNOSIS — I10 Essential (primary) hypertension: Secondary | ICD-10-CM | POA: Diagnosis present

## 2017-07-04 DIAGNOSIS — I340 Nonrheumatic mitral (valve) insufficiency: Secondary | ICD-10-CM | POA: Diagnosis not present

## 2017-07-04 DIAGNOSIS — I63412 Cerebral infarction due to embolism of left middle cerebral artery: Secondary | ICD-10-CM

## 2017-07-04 DIAGNOSIS — G309 Alzheimer's disease, unspecified: Secondary | ICD-10-CM | POA: Diagnosis present

## 2017-07-04 DIAGNOSIS — F0281 Dementia in other diseases classified elsewhere with behavioral disturbance: Secondary | ICD-10-CM

## 2017-07-04 DIAGNOSIS — Z823 Family history of stroke: Secondary | ICD-10-CM | POA: Diagnosis not present

## 2017-07-04 DIAGNOSIS — I252 Old myocardial infarction: Secondary | ICD-10-CM | POA: Diagnosis not present

## 2017-07-04 DIAGNOSIS — Z880 Allergy status to penicillin: Secondary | ICD-10-CM

## 2017-07-04 DIAGNOSIS — G459 Transient cerebral ischemic attack, unspecified: Secondary | ICD-10-CM | POA: Diagnosis not present

## 2017-07-04 DIAGNOSIS — F0280 Dementia in other diseases classified elsewhere without behavioral disturbance: Secondary | ICD-10-CM | POA: Diagnosis present

## 2017-07-04 DIAGNOSIS — F419 Anxiety disorder, unspecified: Secondary | ICD-10-CM | POA: Diagnosis present

## 2017-07-04 DIAGNOSIS — R69 Illness, unspecified: Secondary | ICD-10-CM | POA: Diagnosis not present

## 2017-07-04 DIAGNOSIS — Z96651 Presence of right artificial knee joint: Secondary | ICD-10-CM | POA: Diagnosis present

## 2017-07-04 DIAGNOSIS — G8191 Hemiplegia, unspecified affecting right dominant side: Secondary | ICD-10-CM | POA: Diagnosis present

## 2017-07-04 DIAGNOSIS — M069 Rheumatoid arthritis, unspecified: Secondary | ICD-10-CM | POA: Diagnosis present

## 2017-07-04 DIAGNOSIS — I63233 Cerebral infarction due to unspecified occlusion or stenosis of bilateral carotid arteries: Secondary | ICD-10-CM

## 2017-07-04 DIAGNOSIS — I255 Ischemic cardiomyopathy: Secondary | ICD-10-CM | POA: Diagnosis not present

## 2017-07-04 DIAGNOSIS — I6523 Occlusion and stenosis of bilateral carotid arteries: Secondary | ICD-10-CM | POA: Diagnosis present

## 2017-07-04 DIAGNOSIS — R531 Weakness: Secondary | ICD-10-CM | POA: Diagnosis not present

## 2017-07-04 DIAGNOSIS — R410 Disorientation, unspecified: Secondary | ICD-10-CM

## 2017-07-04 DIAGNOSIS — Z8249 Family history of ischemic heart disease and other diseases of the circulatory system: Secondary | ICD-10-CM | POA: Diagnosis not present

## 2017-07-04 DIAGNOSIS — R29810 Facial weakness: Secondary | ICD-10-CM | POA: Diagnosis present

## 2017-07-04 DIAGNOSIS — R2990 Unspecified symptoms and signs involving the nervous system: Secondary | ICD-10-CM | POA: Diagnosis not present

## 2017-07-04 DIAGNOSIS — I6340 Cerebral infarction due to embolism of unspecified cerebral artery: Secondary | ICD-10-CM

## 2017-07-04 DIAGNOSIS — R29706 NIHSS score 6: Secondary | ICD-10-CM | POA: Diagnosis not present

## 2017-07-04 DIAGNOSIS — I6789 Other cerebrovascular disease: Secondary | ICD-10-CM | POA: Diagnosis not present

## 2017-07-04 DIAGNOSIS — R270 Ataxia, unspecified: Secondary | ICD-10-CM | POA: Diagnosis present

## 2017-07-04 DIAGNOSIS — I639 Cerebral infarction, unspecified: Secondary | ICD-10-CM | POA: Diagnosis not present

## 2017-07-04 DIAGNOSIS — Z9282 Status post administration of tPA (rtPA) in a different facility within the last 24 hours prior to admission to current facility: Secondary | ICD-10-CM

## 2017-07-04 DIAGNOSIS — F329 Major depressive disorder, single episode, unspecified: Secondary | ICD-10-CM | POA: Diagnosis present

## 2017-07-04 DIAGNOSIS — Z882 Allergy status to sulfonamides status: Secondary | ICD-10-CM

## 2017-07-04 LAB — COMPREHENSIVE METABOLIC PANEL
ALBUMIN: 3.5 g/dL (ref 3.5–5.0)
ALK PHOS: 68 U/L (ref 38–126)
ALT: 12 U/L — ABNORMAL LOW (ref 14–54)
AST: 16 U/L (ref 15–41)
Anion gap: 6 (ref 5–15)
BILIRUBIN TOTAL: 0.7 mg/dL (ref 0.3–1.2)
BUN: 15 mg/dL (ref 6–20)
CO2: 26 mmol/L (ref 22–32)
Calcium: 8.8 mg/dL — ABNORMAL LOW (ref 8.9–10.3)
Chloride: 107 mmol/L (ref 101–111)
Creatinine, Ser: 0.93 mg/dL (ref 0.44–1.00)
GFR calc Af Amer: >60 mL/min (ref >60)
GFR calc non Af Amer: 54 mL/min — ABNORMAL LOW (ref >60)
GLUCOSE: 117 mg/dL — AB (ref 65–99)
POTASSIUM: 4 mmol/L (ref 3.5–5.1)
Sodium: 139 mmol/L (ref 135–145)
TOTAL PROTEIN: 7 g/dL (ref 6.5–8.1)

## 2017-07-04 LAB — DIFFERENTIAL
BASOS ABS: 0 10*3/uL (ref 0.0–0.1)
Basophils Relative: 1 %
EOS ABS: 0.2 10*3/uL (ref 0.0–0.7)
Eosinophils Relative: 4 %
LYMPHS ABS: 0.9 10*3/uL (ref 0.7–4.0)
LYMPHS PCT: 17 %
Monocytes Absolute: 0.7 10*3/uL (ref 0.1–1.0)
Monocytes Relative: 13 %
NEUTROS ABS: 3.4 10*3/uL (ref 1.7–7.7)
NEUTROS PCT: 65 %

## 2017-07-04 LAB — I-STAT CHEM 8, ED
BUN: 16 mg/dL (ref 6–20)
CREATININE: 0.8 mg/dL (ref 0.44–1.00)
Calcium, Ion: 1.1 mmol/L — ABNORMAL LOW (ref 1.15–1.40)
Chloride: 106 mmol/L (ref 101–111)
GLUCOSE: 114 mg/dL — AB (ref 65–99)
HEMATOCRIT: 38 % (ref 36.0–46.0)
Hemoglobin: 12.9 g/dL (ref 12.0–15.0)
POTASSIUM: 4 mmol/L (ref 3.5–5.1)
Sodium: 141 mmol/L (ref 135–145)
TCO2: 27 mmol/L (ref 22–32)

## 2017-07-04 LAB — CBC
HCT: 38.9 % (ref 36.0–46.0)
HEMOGLOBIN: 12.1 g/dL (ref 12.0–15.0)
MCH: 27.7 pg (ref 26.0–34.0)
MCHC: 31.1 g/dL (ref 30.0–36.0)
MCV: 89 fL (ref 78.0–100.0)
Platelets: 177 10*3/uL (ref 150–400)
RBC: 4.37 MIL/uL (ref 3.87–5.11)
RDW: 15.6 % — ABNORMAL HIGH (ref 11.5–15.5)
WBC: 5.2 10*3/uL (ref 4.0–10.5)

## 2017-07-04 LAB — APTT: APTT: 29 s (ref 24–36)

## 2017-07-04 LAB — PROTIME-INR
INR: 0.99
Prothrombin Time: 13 seconds (ref 11.4–15.2)

## 2017-07-04 LAB — CBG MONITORING, ED: GLUCOSE-CAPILLARY: 108 mg/dL — AB (ref 65–99)

## 2017-07-04 LAB — ECHOCARDIOGRAM COMPLETE
Height: 62 in
WEIGHTICAEL: 2581 [oz_av]

## 2017-07-04 LAB — MRSA PCR SCREENING: MRSA BY PCR: NEGATIVE

## 2017-07-04 LAB — I-STAT TROPONIN, ED: Troponin i, poc: 0 ng/mL (ref 0.00–0.08)

## 2017-07-04 MED ORDER — SODIUM CHLORIDE 0.9 % IV SOLN: INTRAVENOUS | Status: DC

## 2017-07-04 MED ORDER — LABETALOL HCL 5 MG/ML IV SOLN
20.0000 mg | Freq: Once | INTRAVENOUS | Status: AC
  Administered 2017-07-04: 20 mg via INTRAVENOUS
  Filled 2017-07-04: qty 4

## 2017-07-04 MED ORDER — SODIUM CHLORIDE 0.9 % IV SOLN
50.0000 mL | Freq: Once | INTRAVENOUS | Status: AC
  Administered 2017-07-04: 50 mL via INTRAVENOUS

## 2017-07-04 MED ORDER — LORAZEPAM 2 MG/ML IJ SOLN: 2.0000 mg | Freq: Once | INTRAMUSCULAR | Status: AC

## 2017-07-04 MED ORDER — HALOPERIDOL LACTATE 5 MG/ML IJ SOLN
5.0000 mg | Freq: Three times a day (TID) | INTRAMUSCULAR | Status: DC | PRN
  Administered 2017-07-04: 5 mg via INTRAMUSCULAR

## 2017-07-04 MED ORDER — IOPAMIDOL (ISOVUE-370) INJECTION 76%
INTRAVENOUS | Status: AC
  Administered 2017-07-04: 50 mL
  Filled 2017-07-04: qty 50

## 2017-07-04 MED ORDER — STROKE: EARLY STAGES OF RECOVERY BOOK
Freq: Once | Status: AC
  Administered 2017-07-06: 01:00:00
  Filled 2017-07-04: qty 1

## 2017-07-04 MED ORDER — LORAZEPAM 2 MG/ML IJ SOLN
INTRAMUSCULAR | Status: AC
  Administered 2017-07-04: 2 mg
  Filled 2017-07-04: qty 1

## 2017-07-04 MED ORDER — HALOPERIDOL LACTATE 5 MG/ML IJ SOLN
INTRAMUSCULAR | Status: AC
  Filled 2017-07-04: qty 1

## 2017-07-04 MED ORDER — CLEVIDIPINE BUTYRATE 0.5 MG/ML IV EMUL: 0.0000 mg/h | INTRAVENOUS | Status: DC

## 2017-07-04 MED ORDER — LORAZEPAM 2 MG/ML IJ SOLN
1.0000 mg | Freq: Once | INTRAMUSCULAR | Status: AC
  Administered 2017-07-04: 1 mg via INTRAVENOUS
  Filled 2017-07-04: qty 1

## 2017-07-04 MED ORDER — PANTOPRAZOLE SODIUM 40 MG IV SOLR
40.0000 mg | Freq: Every day | INTRAVENOUS | Status: DC
  Administered 2017-07-04: 40 mg via INTRAVENOUS
  Filled 2017-07-04: qty 40

## 2017-07-04 MED ORDER — SENNOSIDES-DOCUSATE SODIUM 8.6-50 MG PO TABS: 1.0000 | ORAL_TABLET | Freq: Every evening | ORAL | Status: DC | PRN

## 2017-07-04 MED ORDER — ALTEPLASE (STROKE) FULL DOSE INFUSION
0.9000 mg/kg | Freq: Once | INTRAVENOUS | Status: AC
  Administered 2017-07-04: 66 mg via INTRAVENOUS
  Filled 2017-07-04: qty 100

## 2017-07-04 MED ORDER — QUETIAPINE FUMARATE 25 MG PO TABS
25.0000 mg | ORAL_TABLET | Freq: Two times a day (BID) | ORAL | Status: DC
Start: 2017-07-04 — End: 2017-07-06
  Administered 2017-07-04 – 2017-07-06 (×4): 25 mg via ORAL
  Filled 2017-07-04 (×4): qty 1

## 2017-07-04 MED ORDER — ACETAMINOPHEN 325 MG PO TABS: 650.0000 mg | ORAL_TABLET | Freq: Four times a day (QID) | ORAL | Status: DC | PRN

## 2017-07-04 NOTE — ED Triage Notes (Signed)
Pt comes from [facility], LSN 315, sudden onset of weakness on the R side with R arm and leg drift

## 2017-07-04 NOTE — Significant Event (Signed)
Rapid Response Event Note  Overview: Code stroke       Initial Focused Assessment: Right sided weakness   Interventions: Neuro exam and IV TPA  Plan of Care (if not transferred):  Event Summary:  Assisted with care of patient with right sided weakness. Reported by EMS that LSW was 03:15am while being assisted to ambulate at a nursing home. Alert and responds to verbal. Patient has right arm drift and left leg drift. NIHSS of 6 on initial exam. Dr. Otelia Limes examined patient and IV TPA was ordered. IV TPA checklist was reviewed. The IV TPA was started at 04:59 after dosage information was received from pharmacist. ED RN was provided guidelines for neuro assessment and vital sign collection. We will continue to assist with care as needed.   Beryl Meager [place]

## 2017-07-04 NOTE — Progress Notes (Signed)
  Echocardiogram 2D Echocardiogram has been performed.  Connie Wagner 07/04/2017, 10:32 AM

## 2017-07-04 NOTE — H&P (Addendum)
Admission H&P    Chief Complaint: Right sided weakness  HPI: Connie Wagner is an 83 y.o. female presenting from [facility] with acute onset of right arm and leg weakness with facial droop. LSN of 0315 is the same as TOSO. She has no prior history of stroke and is not on a blood thinner. Takes melatonin as her only medication per daughter; only medication on her list at the facility is Trazodone. She has Alzheimer's dementia. PMHx also includes several stroke risk factors.  LSN: [id number] tPA Given: Yes  Past Medical History:  Diagnosis Date  . Anemia    iron deficient  . Anxiety   . CAD (coronary artery disease)    a. s/p Inf STEMI 2/14 => LHC 10/18/12: Diffuse distal LM 30%, LAD 50% calcified stenosis just after first septal perforator, diagonal branches with mild 20-30% stenosis, mid RCA 99%. PCI: Promus Premier DES to the mid RCA  . Carotid stenosis    a. LifeLine screening done in 2009 with carotid stenosis noted;  b.  Carotid dopplers 3/14: R 0-39%, L 40-59%, repeat 1 year  . Chronic lower back pain   . 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 Dementia    needs redirection freq.  . Depression   . Hypertension   . Ischemic cardiomyopathy    a. Echocardiogram 10/18/12: Mild LVH, inferior and inferoseptal AK, posterior HK, EF 45%, grade 1 diastolic dysfunction, aortic sclerosis without stenosis, mild MR, mild LAE, RV systolic function moderately reduced (pacer wire in right ventricle), PASP 32  . Migraines    "haven't had one since going thru menopause"  . Rheumatoid arthritis(714.0)   . Upper GI bleeding 09/14/11   "have had it once before today"    Past Surgical History:  Procedure Laterality Date  . BACK SURGERY    . BREAST LUMPECTOMY     "benign tumor removed"  . HEMATOMA EVACUATION  10/2002   lumbar  . INCISION AND DRAINAGE OF WOUND  10/2002   lumbar wound infection  . JOINT REPLACEMENT    .  KNEE ARTHROSCOPY  01/2001   right  . LUMBAR DISC SURGERY  09/2002   Hemilaminectomy, L3, complete laminectomy, L4, hemi- semilaminectomy, L5 and right S1, and diskectomy, L5-S1.  Marland Kitchen LUMBAR LAMINECTOMY  10/2003   for epidural abscess evacuation; L4-5  . TOTAL KNEE ARTHROPLASTY  01/2002   right    Family History  Problem Relation Age of Onset  . Heart attack Mother   . Stroke Mother   . Hypertension Sister   . Anesthesia problems Neg Hx   . Hypotension Neg Hx   . Malignant hyperthermia Neg Hx   . Pseudochol deficiency Neg Hx    Social History:  reports that  has never smoked. she has never used smokeless tobacco. She reports that she drinks about 3.0 oz of alcohol per week. She reports that she does not use drugs.  Allergies:  Allergies  Allergen Reactions  . Iron Nausea And Vomiting    Loose stool  . Penicillins Rash    Has patient had a PCN reaction causing immediate rash, facial/tongue/throat swelling, SOB or lightheadedness with hypotension: No  Has patient had a PCN reaction causing severe rash involving mucus membranes or skin necrosis: No Has patient had a PCN reaction that required hospitalization No Has patient had a PCN reaction occurring within the last 10 years: No If all of the above answers are "NO", then  may proceed with Cephalosporin use.   . Sulfa Drugs Cross Reactors Rash    (Not in a hospital admission)  ROS: Limited ROS due to dementia. Endorses bilateral leg and knee pain, worse on the right. Does not endorse additional complaints. States she does not feel ill. Is not aware of any weakness.   Physical Examination: Blood pressure (!) 165/72, height 5\' 2"  (1.575 m), weight 73.2 kg (161 lb 5 oz).  HEENT-  [place]/AT Lungs - Respirations unlabored Extremities - Warm and well perfused. No edema.   Neurologic Examination: Mental Status: Awake and alert. Poor attention and concentration. Naming and comprehension deficits noted. No dysarthria.  Cranial Nerves: II:   Temporal visual fields intact bilaterally; does not cooperate with more detailed exam. PERRL.   III,IV, VI: EOM full without nystagmus. No ptosis noted.  V,VII: Smile symmetric, facial temp sensation impaired on the right VIII: hearing intact to conversation IX,X: No hypophonia XI: Head at midline XII: midline tongue extension  Motor: RUE: 4/5 proximal and distal RLE: 4/5 proximal and distal LUE and LLE: 5/5 Tone and bulk normal x 4 Sensory: Decreased temp sensation RLE. FT intact x 4 without extinction Deep Tendon Reflexes:  2+ throughout except for 0 bilateral achilles and right patella deferred due to knee pain.  Plantars: Right: Briskly upgoing   Left: Equivocal Cerebellar: Ataxia with right FNF.  Gait: Deferred  Results for orders placed or performed during the hospital encounter of 07/04/17 (from the past 48 hour(s))  Protime-INR     Status: None   Collection Time: 07/04/17  4:23 AM  Result Value Ref Range   Prothrombin Time 13.0 11.4 - 15.2 seconds   INR 0.99   APTT     Status: None   Collection Time: 07/04/17  4:23 AM  Result Value Ref Range   aPTT 29 24 - 36 seconds  CBC     Status: Abnormal   Collection Time: 07/04/17  4:23 AM  Result Value Ref Range   WBC 5.2 4.0 - 10.5 K/uL   RBC 4.37 3.87 - 5.11 MIL/uL   Hemoglobin 12.1 12.0 - 15.0 g/dL   HCT 49.7 02.6 - 37.8 %   MCV 89.0 78.0 - 100.0 fL   MCH 27.7 26.0 - 34.0 pg   MCHC 31.1 30.0 - 36.0 g/dL   RDW 58.8 (H) 50.2 - 77.4 %   Platelets 177 150 - 400 K/uL  Differential     Status: None   Collection Time: 07/04/17  4:23 AM  Result Value Ref Range   Neutrophils Relative % 65 %   Neutro Abs 3.4 1.7 - 7.7 K/uL   Lymphocytes Relative 17 %   Lymphs Abs 0.9 0.7 - 4.0 K/uL   Monocytes Relative 13 %   Monocytes Absolute 0.7 0.1 - 1.0 K/uL   Eosinophils Relative 4 %   Eosinophils Absolute 0.2 0.0 - 0.7 K/uL   Basophils Relative 1 %   Basophils Absolute 0.0 0.0 - 0.1 K/uL  CBG monitoring, ED     Status: Abnormal    Collection Time: 07/04/17  4:25 AM  Result Value Ref Range   Glucose-Capillary 108 (H) 65 - 99 mg/dL   Comment 1 Notify RN    Comment 2 Document in Chart   I-stat troponin, ED     Status: None   Collection Time: 07/04/17  4:31 AM  Result Value Ref Range   Troponin i, poc 0.00 0.00 - 0.08 ng/mL   Comment 3  Comment: Due to the release kinetics of cTnI, a negative result within the first hours of the onset of symptoms does not rule out myocardial infarction with certainty. If myocardial infarction is still suspected, repeat the test at appropriate intervals.   I-Stat Chem 8, ED     Status: Abnormal   Collection Time: 07/04/17  4:33 AM  Result Value Ref Range   Sodium 141 135 - 145 mmol/L   Potassium 4.0 3.5 - 5.1 mmol/L   Chloride 106 101 - 111 mmol/L   BUN 16 6 - 20 mg/dL   Creatinine, Ser 6.86 0.44 - 1.00 mg/dL   Glucose, Bld 168 (H) 65 - 99 mg/dL   Calcium, Ion 3.72 (L) 1.15 - 1.40 mmol/L   TCO2 27 22 - 32 mmol/L   Hemoglobin 12.9 12.0 - 15.0 g/dL   HCT 90.2 11.1 - 55.2 %   Ct Head Code Stroke Wo Contrast  Result Date: 07/04/2017 CLINICAL DATA:  Code stroke. RIGHT-sided weakness, RIGHT arm drift. Last seen well at 0315 hours. History of migraines, hypertension, dementia and carotid stenosis. EXAM: CT HEAD WITHOUT CONTRAST TECHNIQUE: Contiguous axial images were obtained from the base of the skull through the vertex without intravenous contrast. COMPARISON:  CT HEAD September 16, 2016 FINDINGS: BRAIN: No intraparenchymal hemorrhage, mass effect nor midline shift. Moderate to severe ventriculomegaly, disproportionate sulcal effacement at the convexities with narrowed callosal angle. Severe symmetric mesial temporal lobe atrophy. Patchy to confluent unchanged supratentorial white matter hypodensities. No acute large vascular territory infarct. VASCULAR: Severe calcific atherosclerosis of the carotid siphons. SKULL: No skull fracture. Severe LEFT temporomandibular  osteoarthrosis. No significant scalp soft tissue swelling. SINUSES/ORBITS: The mastoid air-cells and included paranasal sinuses are well-aerated.The included ocular globes and orbital contents are non-suspicious. Status post bilateral ocular lens implants. OTHER: None. ASPECTS [facility] Stroke Program Early CT Score) - Ganglionic level infarction (caudate, lentiform nuclei, internal capsule, insula, M1-M3 cortex): 7 - Supraganglionic infarction (M4-M6 cortex): 3 Total score (0-10 with 10 being normal): 10 IMPRESSION: 1. No acute intracranial process. 2. 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 3. Advanced mesial temporal lobe atrophy associated with neurodegenerative disorders. 4. Stable moderate to severe chronic small vessel ischemic disease. 5. ASPECTS is 10. 6. Dr. Otelia Limes, Neurology paged on July 04, 2017 at 0438 hours, awaiting return call. Electronically Signed   By: Awilda Metro M.D.   On: 07/04/2017 04:43    Assessment: 83 y.o. female presenting with right sided weakness. Overall presentation most consistent with acute ischemic stroke. 1. Exam reveals right arm and leg weakness, right arm ataxia and right sensory deficit. Also noted is cognitive/communication deficit which per daughter is her baseline. NIHSS of 6 with findings referable to a left MCA stroke, most likely an M2 or more distal branch. CT shows no hemorrhage. 2. Alzheimer's dementia. CT head shows enlarged ventricles suggestive of possible NPH.  3. Stroke Risk Factors - CAD, prior STEMI, HTN, carotid stenosis, ischemic cardiomyopathy 4. Bilateral lower extremity pain. Lower extremity muscle beds are tender to palpation. Also somewhat hypersensitive to light noxious stimuli. Muscle spasm is on DDx. Likely has underlying joint pain.  5. Findings suspicious for possible NPH on CT head, per Radiology  Plan: 1. After comprehensive review of possible contraindications, the patient has  no absolute contraindications to tPA administration. 2. Patient is a tPA candidate. Discussed extensively the risks/benefits of tPA treatment vs. no treatment with her daughter by telephone, including risks of hemorrhage and death with tPA administration versus  worse overall outcomes on average in patients within tPA time window who are not administered tPA. Patient's dementia precludes meaningful medical decision making on her part at this time. Overall benefits of tPA regarding long-term prognosis are felt to outweigh risks. The patient's daughter expressed understanding and wish to proceed with tPA. Telephone consent witnessed by patient's nurse.  3. Admitting to Neuro ICU under the Neurology Stroke Service.  4. Post-tPA order set to include frequent neuro checks and BP management.  5. No antiplatelet medications or anticoagulants for at least 24 hours following tPA.  6. DVT prophylaxis with SCDs.  7. Will need to be started on a statin.  8. Will need to start antiplatelet therapy if follow up CT at 24 hours is negative for hemorrhagic conversion. 9. Carotid ultrasound to further evaluate carotid atherosclerosis. May be candidate for CEA.  10. TTE.  11. NPO until passes swallow evaluation.  12. STAT CTA head and neck 13. HgbA1c, fasting lipid panel 14. MRI of the brain without contrast 15. PT consult, OT consult, Speech consult 16. Telemetry monitoring 17. May need diagnostic LP for possible NPH if MRI brain shows findings most consistent with NPH (transependymal flow, CSF jet sign in aqueduct of Sylvius). However, given her age she would likely not be an optimal candidate for CSF shunting, in which case diagnostic LP would not be indicated.   Addendum: STAT CTA head and neck obtained during tPA infusion reveals no LVO. Atherosclerotic changes are noted.  45 minutes spent in the emergent Neurological evaluation and management of this critically ill acute stroke patient.   Electronically  signed: Dr. Caryl Pina 07/04/2017, 5:03 AM

## 2017-07-04 NOTE — ED Notes (Signed)
Pt complaining of all over bodyaches and muscle pain, verbal order for 1mg  ativan per Dr. 

## 2017-07-04 NOTE — ED Provider Notes (Signed)
[facility] EMERGENCY DEPARTMENT Provider Note   CSN: [id number] Arrival date & time: 07/04/17  0424     History   Chief Complaint Chief Complaint  Patient presents with  . Code Stroke    HPI Connie Wagner is a 83 y.o. female.  HPI  MSE was initiated and I personally evaluated the patient and placed orders (if any) at  4:26 AM on July 04, 2017. Patient presents by EMS with concerns for stroke.  Last seen normal at 315 this morning.  She called out to staff at her facility "not feeling well."  They attempted to ambulate her and noted right-sided weakness.  Patient is only oriented to herself.  When asked if she feels okay she states "no."  She does have some grip strength in the right hand and can wiggle her toes on the right feet but does have some drift.  She was cleared for CT scan.  Patient was evaluated by neurology in the CT scan.  NIH stroke scale 6.  TPA was administered.  Level 5 caveat for acuity of condition and dementia.   Past Medical History:  Diagnosis Date  . Anemia    iron deficient  . Anxiety   . CAD (coronary artery disease)    a. s/p Inf STEMI 2/14 => LHC 10/18/12: Diffuse distal LM 30%, LAD 50% calcified stenosis just after first septal perforator, diagonal branches with mild 20-30% stenosis, mid RCA 99%. PCI: Promus Premier DES to the mid RCA  . Carotid stenosis    a. LifeLine screening done in 2009 with carotid stenosis noted;  b.  Carotid dopplers 3/14: R 0-39%, L 40-59%, repeat 1 year  . Chronic lower back pain   . 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 Dementia    needs redirection freq.  . Depression   . Hypertension   . Ischemic cardiomyopathy    a. Echocardiogram 10/18/12: Mild LVH, inferior and inferoseptal AK, posterior HK, EF 45%, grade 1 diastolic dysfunction, aortic sclerosis without stenosis, mild MR, mild LAE, RV systolic function  moderately reduced (pacer wire in right ventricle), PASP 32  . Migraines    "haven't had one since going thru menopause"  . Rheumatoid arthritis(714.0)   . Upper GI bleeding 09/14/11   "have had it once before today"    Patient Active Problem List   Diagnosis Date Noted  . Physical deconditioning 10/23/2012  . Cardiogenic shock (HCC) 10/23/2012  . Wenckebach second degree AV block 10/20/2012  . Complete heart block (HCC) 10/19/2012  . Acute renal failure (HCC) 10/19/2012  . Acute lower UTI 10/19/2012  . Acute systolic CHF (congestive heart failure) (HCC) 10/19/2012  . Normocytic anemia 10/19/2012  . S/P drug eluting coronary stent placement 10/19/2012  . Coronary artery disease 10/19/2012  . Acute MI, inferior wall, initial episode of care (HCC) 10/18/2012  . Urinary tract infection, E. coli 08/21/2012  . Hypertension   . Dementia   . Depression   . Prepyloric ulcer 09/15/2011  . Hypotension due to blood loss 09/15/2011  . Leukocytosis 09/15/2011  . Extrarenal azotemia 09/15/2011  . GI bleeding 09/14/2011  . Anemia associated with acute blood loss 09/14/2011  . Tachycardia 09/14/2011  . HTN (hypertension) 09/14/2011  . Anxiety 09/14/2011  . ETOH abuse 09/14/2011  . 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 09/14/2011    Past Surgical History:  Procedure Laterality Date  .  BACK SURGERY    . BREAST LUMPECTOMY     "benign tumor removed"  . HEMATOMA EVACUATION  10/2002   lumbar  . INCISION AND DRAINAGE OF WOUND  10/2002   lumbar wound infection  . JOINT REPLACEMENT    . KNEE ARTHROSCOPY  01/2001   right  . LUMBAR DISC SURGERY  09/2002   Hemilaminectomy, L3, complete laminectomy, L4, hemi- semilaminectomy, L5 and right S1, and diskectomy, L5-S1.  Marland Kitchen LUMBAR LAMINECTOMY  10/2003   for epidural abscess evacuation; L4-5  . TOTAL KNEE ARTHROPLASTY  01/2002   right    OB History    Gravida Para Term Preterm AB  Living   0 0 0 0 0 0   SAB TAB Ectopic Multiple Live Births   0 0 0 0         Home Medications    Prior to Admission medications   Not on File    Family History Family History  Problem Relation Age of Onset  . Heart attack Mother   . Stroke Mother   . Hypertension Sister   . Anesthesia problems Neg Hx   . Hypotension Neg Hx   . Malignant hyperthermia Neg Hx   . Pseudochol deficiency Neg Hx     Social History Social History   Tobacco Use  . Smoking status: Never Smoker  . Smokeless tobacco: Never Used  Substance Use Topics  . Alcohol use: Yes    Alcohol/week: 3.0 oz    Types: 5 Glasses of wine per week  . Drug use: No     Allergies   Iron; Penicillins; and Sulfa drugs cross reactors   Review of Systems Review of Systems  Unable to perform ROS: Dementia     Physical Exam Updated Vital Signs BP (!) 165/72   Pulse 79   Temp 98.3 F (36.8 C) (Oral)   Resp 17   Ht 5\' 2"  (1.575 m)   Wt 73.2 kg (161 lb 5 oz)   SpO2 94%   BMI 29.50 kg/m   Physical Exam  Constitutional:  Elderly, no acute distress  HENT:  Head: Normocephalic and atraumatic.  Eyes: Pupils are equal, round, and reactive to light.  Neck: Neck supple.  Cardiovascular: Normal rate, regular rhythm and normal heart sounds.  Pulmonary/Chest: Effort normal and breath sounds normal. No respiratory distress. She has no wheezes.  Abdominal: Soft. There is no tenderness.  Musculoskeletal: She exhibits no edema.  Neurological: She is alert.  Oriented only to self, cranial nerves II through XII intact, drift noted right upper extremity, decreased proximal strength, 4+ out of 5 right lower extremity strength, drift noted, no aphasia  Skin: Skin is warm and dry.  Psychiatric: She has a normal mood and affect.  Nursing note and vitals reviewed.    ED Treatments / Results  Labs (all labs ordered are listed, but only abnormal results are displayed) Labs Reviewed  CBC - Abnormal; Notable for the  following components:      Result Value   RDW 15.6 (*)    All other components within normal limits  CBG MONITORING, ED - Abnormal; Notable for the following components:   Glucose-Capillary 108 (*)    All other components within normal limits  I-STAT CHEM 8, ED - Abnormal; Notable for the following components:   Glucose, Bld 114 (*)    Calcium, Ion 1.10 (*)    All other components within normal limits  PROTIME-INR  APTT  DIFFERENTIAL  COMPREHENSIVE METABOLIC PANEL  I-STAT  TROPONIN, ED    EKG  EKG Interpretation None       Radiology Ct Head Code Stroke Wo Contrast  Result Date: 07/04/2017 CLINICAL DATA:  Code stroke. RIGHT-sided weakness, RIGHT arm drift. Last seen well at 0315 hours. History of migraines, hypertension, dementia and carotid stenosis. EXAM: CT HEAD WITHOUT CONTRAST TECHNIQUE: Contiguous axial images were obtained from the base of the skull through the vertex without intravenous contrast. COMPARISON:  CT HEAD September 16, 2016 FINDINGS: BRAIN: No intraparenchymal hemorrhage, mass effect nor midline shift. Moderate to severe ventriculomegaly, disproportionate sulcal effacement at the convexities with narrowed callosal angle. Severe symmetric mesial temporal lobe atrophy. Patchy to confluent unchanged supratentorial white matter hypodensities. No acute large vascular territory infarct. VASCULAR: Severe calcific atherosclerosis of the carotid siphons. SKULL: No skull fracture. Severe LEFT temporomandibular osteoarthrosis. No significant scalp soft tissue swelling. SINUSES/ORBITS: The mastoid air-cells and included paranasal sinuses are well-aerated.The included ocular globes and orbital contents are non-suspicious. Status post bilateral ocular lens implants. OTHER: None. ASPECTS [facility] Stroke Program Early CT Score) - Ganglionic level infarction (caudate, lentiform nuclei, internal capsule, insula, M1-M3 cortex): 7 - Supraganglionic infarction (M4-M6 cortex): 3 Total score  (0-10 with 10 being normal): 10 IMPRESSION: 1. No acute intracranial process. 2. 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 3. Advanced mesial temporal lobe atrophy associated with neurodegenerative disorders. 4. Stable moderate to severe chronic small vessel ischemic disease. 5. ASPECTS is 10. 6. Dr. Otelia Limes, Neurology paged on July 04, 2017 at 0438 hours, awaiting return call. Electronically Signed   By: Awilda Metro M.D.   On: 07/04/2017 04:43    Procedures Procedures (including critical care time)  CRITICAL CARE Performed by: Shon Baton   Total critical care time: 30 minutes  Critical care time was exclusive of separately billable procedures and treating other patients.  Critical care was necessary to treat or prevent imminent or life-threatening deterioration.  Critical care was time spent personally by me on the following activities: development of treatment plan with patient and/or surrogate as well as nursing, discussions with consultants, evaluation of patient's response to treatment, examination of patient, obtaining history from patient or surrogate, ordering and performing treatments and interventions, ordering and review of laboratory studies, ordering and review of radiographic studies, pulse oximetry and re-evaluation of patient's condition.   Medications Ordered in ED Medications  alteplase (ACTIVASE) 1 mg/mL infusion 66 mg (66 mg Intravenous New Bag/Given 07/04/17 0459)    Followed by  0.9 %  sodium chloride infusion (not administered)  iopamidol (ISOVUE-370) 76 % injection (50 mLs  Contrast Given 07/04/17 0448)     Initial Impression / Assessment and Plan / ED Course  I have reviewed the triage vital signs and the nursing notes.  Pertinent labs & imaging results that were available during my care of the patient were reviewed by me and considered in my medical decision making (see chart for details).      Patient presents with concerns for acute stroke.  NIH stroke scale of 6.  She is within TPA window.  Neurology administered TPA while in the CT scanner.  She is otherwise nontoxic-appearing and vital signs are reassuring.  Patient to be admitted to neurology.  Final Clinical Impressions(s) / ED Diagnoses   Final diagnoses:  Acute ischemic stroke [facility])    ED Discharge Orders    None       Wilkie Aye, Mayer Masker, MD 07/04/17 [phone]

## 2017-07-04 NOTE — Care Management Note (Signed)
Case Management Note  Patient Details  Name: Connie Wagner MRN: [id number] Date of Birth: 07/30/1929  Subjective/Objective:   Pt admitted on 07/04/17 with acute ischemic stroke.  PTA, pt resided at [facility].                  Action/Plan: CSW consulted to facilitate return to SNF upon medical stability.  Will follow progress.    Expected Discharge Date:                  Expected Discharge Plan:  Skilled Nursing Facility  In-House Referral:  Clinical Social Work  Discharge planning Services  CM Consult  Post Acute Care Choice:    Choice offered to:     DME Arranged:    DME Agency:     HH Arranged:    HH Agency:     Status of Service:  In process, will continue to follow  If discussed at Long Length of Stay Meetings, dates discussed:    Additional Comments:  Quintella Baton, RN, BSN  Trauma/Neuro ICU Case Manager [phone]

## 2017-07-04 NOTE — Progress Notes (Signed)
STROKE TEAM PROGRESS NOTE   SUBJECTIVE (INTERVAL HISTORY) Her RN is at the bedside.  Overall she feels her condition is rapidly improving. As per RN, pt came to ICU at 7:30am and all symptoms resolved. Moving all extremities and no facial droop.  She is on her baseline.  However, continued to have mild agitation, delirium, get out of bed.   OBJECTIVE Temp:  [98.2 F (36.8 C)-98.3 F (36.8 C)] 98.2 F (36.8 C) (11/06 0800) Pulse Rate:  [33-179] 75 (11/06 1200) Resp:  [13-30] 17 (11/06 1200) BP: (84-165)/(46-120) 153/120 (11/06 1200) SpO2:  [85 %-100 %] 98 % (11/06 1200) Weight:  [161 lb 5 oz (73.2 kg)-161 lb 8 oz (73.3 kg)] 161 lb 5 oz (73.2 kg) (11/06 0445)  Recent Labs  Lab 07/04/17 0425  GLUCAP 108*   Recent Labs  Lab 07/04/17 0423 07/04/17 0433  NA 139 141  K 4.0 4.0  CL 107 106  CO2 26  --   GLUCOSE 117* 114*  BUN 15 16  CREATININE 0.93 0.80  CALCIUM 8.8*  --    Recent Labs  Lab 07/04/17 0423  AST 16  ALT 12*  ALKPHOS 68  BILITOT 0.7  PROT 7.0  ALBUMIN 3.5   Recent Labs  Lab 07/04/17 0423 07/04/17 0433  WBC 5.2  --   NEUTROABS 3.4  --   HGB 12.1 12.9  HCT 38.9 38.0  MCV 89.0  --   PLT 177  --    No results for input(s): CKTOTAL, CKMB, CKMBINDEX, TROPONINI in the last 168 hours. Recent Labs    07/04/17 0423  LABPROT 13.0  INR 0.99   No results for input(s): COLORURINE, LABSPEC, PHURINE, GLUCOSEU, HGBUR, BILIRUBINUR, KETONESUR, PROTEINUR, UROBILINOGEN, NITRITE, LEUKOCYTESUR in the last 72 hours.  Invalid input(s): APPERANCEUR     Component Value Date/Time   CHOL 144 01/02/2013 0843   TRIG 87.0 01/02/2013 0843   HDL 71.70 01/02/2013 0843   CHOLHDL 2 01/02/2013 0843   VLDL 17.4 01/02/2013 0843   LDLCALC 55 01/02/2013 0843   Lab Results  Component Value Date   HGBA1C 6.2 (H) 10/18/2012      Component Value Date/Time   LABOPIA NONE DETECTED 02/04/2011 1911   COCAINSCRNUR NONE DETECTED 02/04/2011 1911   COCAINSCRNUR NEGATIVE 01/22/2011  0451   LABBENZ NONE DETECTED 02/04/2011 1911   LABBENZ NEGATIVE 01/22/2011 0451   AMPHETMU NONE DETECTED 02/04/2011 1911   THCU NONE DETECTED 02/04/2011 1911   LABBARB NONE DETECTED 02/04/2011 1911    No results for input(s): ETH in the last 168 hours.  I have personally reviewed the radiological images below and agree with the radiology interpretations.  Ct Angio Head and neck W Or Wo Contrast 07/04/2017 IMPRESSION: CTA NECK: 1. 60% stenosis LEFT internal carotid artery origin. 2. 50% stenosis proximal RIGHT internal carotid artery. 3. **An incidental finding of potential clinical significance has Wagner found. 3.6 cm ascending aorta. Recommend annual imaging followup by CTA or MRA. This recommendation follows 2010 ACCF/AHA/AATS/ACR/ASA/SCA/SCAI/SIR/STS/SVM Guidelines for the Diagnosis and Management of Patients with Thoracic Aortic Disease. Circulation.2010; 121: Q595-G387** 4. Patulous esophagus with air-fluid level, aspiration risk. CTA HEAD: 1.  No emergent large vessel occlusion or severe stenosis. 2. Calcific atherosclerosis resulting in mild stenosis bilateral internal carotid artery's.   Ct Head Code Stroke Wo Contrast 07/04/2017 IMPRESSION: 1. No acute intracranial process. 2. 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 3. Advanced mesial temporal lobe atrophy associated with neurodegenerative disorders. 4. Stable moderate to severe chronic  small vessel ischemic disease. 5. ASPECTS is 10.   MRI pending  2D Echocardiogram pending   PHYSICAL EXAM  Temp:  [98.2 F (36.8 C)-98.3 F (36.8 C)] 98.2 F (36.8 C) (11/06 0800) Pulse Rate:  [33-179] 75 (11/06 1200) Resp:  [13-30] 17 (11/06 1200) BP: (84-165)/(46-120) 153/120 (11/06 1200) SpO2:  [85 %-100 %] 98 % (11/06 1200) Weight:  [161 lb 5 oz (73.2 kg)-161 lb 8 oz (73.3 kg)] 161 lb 5 oz (73.2 kg) (11/06 0445)  General - Well nourished, well developed, in no apparent  distress.  Ophthalmologic - fundi not visualized due to noncooperation.  Cardiovascular - Regular rate and rhythm with no murmur.  Mental Status -  Orientated to self only, not orientated to time, place, or age.  No aphasia, fluent language, content of language limited.  Cranial Nerves II - XII - II - Visual field intact OU. III, IV, VI - Extraocular movements intact. V - Facial sensation intact bilaterally. VII - Facial movement intact bilaterally. VIII - Hearing & vestibular intact bilaterally. X - Palate elevates symmetrically. XI - Chin turning & shoulder shrug intact bilaterally. XII - Tongue protrusion intact.  Motor Strength - The patient's strength was symmetrical in all extremities and pronator drift was absent.  Bulk was normal and fasciculations were absent.   Motor Tone - Muscle tone was assessed at the neck and appendages and was normal.  Reflexes - The patient's reflexes were symmetrical in all extremities and she had no pathological reflexes.  Sensory - Light touch, temperature/pinprick were assessed and were symmetrical.    Coordination - not cooperative on exam tremor was absent.  Gait and Station - deferred.   ASSESSMENT/PLAN Connie Wagner is a 83 y.o. female with history of dementia living in nursing home, CAD, history of seizure, hypertension, CHF, RA admitted for right-sided arm and leg weakness and facial droop. TPA given.    right brain stroke vs TIA  Resultant strokelike symptoms resolved, however, developed delirium  MRI pending  CTA head and neck left ICA/CCA 60% stenosis, right ICA 50% stenosis, bilateral siphon atherosclerosis  2D Echo pending  LDL pending  HgbA1c pending  SCDs for VTE prophylaxis  Diet Heart Room service appropriate? Yes; Fluid consistency: Thin   No antithrombotic prior to admission, now on No antithrombotic within 24 hours of TPA  Ongoing aggressive stroke risk factor management  Therapy recommendations:  Pending  Disposition: Pending  Dementia with in-hospital delirium  History of Alzheimer's dementia  No home meds  Developed delirium with agitation, confusion and combative behavior  Haldol 5 mg IM (EKG QTC 450)  Seroquel 25 mg twice daily  Soft restraint  May consider sitter  Carotid stenosis  CTA head neck left ICA/CCA 60% stenosis, right ICA 50% stenosis  Not candidate for intervention  Close monitoring as outpatient  Consider antiplatelet after 24 hours of TPA  Hypertension Stable Permissive hypertension (OK if <180/105) for 24-48 hours post stroke and then gradually normalized within 5-7 days.  Long term BP goal normotensive  Hyperlipidemia  Home meds: None  LDL pending, goal < 70  Other Stroke Risk Factors  Advanced age  Migraines  CAD  Other Active Problems  Single seizure episode in 2012 -no seizure medications at home  RA  history of a upper GI bleeding  Hospital day # 0  This patient is critically ill due to stroke versus TIA, status post TPA, dementia with delirium and at significant risk of neurological worsening, death form recurrent stroke,  hemorrhagic conversion, hemorrhage, seizure, delirium. This patient's care requires constant monitoring of vital signs, hemodynamics, respiratory and cardiac monitoring, review of multiple databases, neurological assessment, discussion with family, other specialists and medical decision making of high complexity. I spent 40 minutes of neurocritical care time in the care of this patient.   Marvel Plan, MD PhD Stroke Neurology 07/04/2017 12:18 PM    To contact Stroke Continuity provider, please refer to [email or web address]. After hours, contact General Neurology

## 2017-07-04 NOTE — Progress Notes (Addendum)
Pt increasingly agitated, pulling at lines, taking BP cuff off, and trying to get out of bed.  Stroke team notified.  New orders placed.  Will continue to monitor.  Update:  Pt is still very agitated and will not allow staff to check BP.  Have tried to put cuff in different extremities but pt refused.  Continues to be aggressive to staff at times.  Continues to kick and scream when staff try to put restrain on pt.  MD made aware.  Will continue to monitor.

## 2017-07-04 NOTE — ED Notes (Signed)
Verbal consent from daughter on phone to both this RN and Dr. Otelia Limes for TPA administration

## 2017-07-04 NOTE — Progress Notes (Signed)
PT Cancellation Note  Patient Details Name: Connie Wagner MRN: [id number] DOB: 1928/10/14   Cancelled Treatment:    Reason Eval/Treat Not Completed: Medical issues which prohibited therapy(pt on strict bedrest)   [name] [name] 07/04/2017, 11:52 AM Delaney Meigs, PT [phone]

## 2017-07-05 ENCOUNTER — Inpatient Hospital Stay (HOSPITAL_COMMUNITY): Payer: Medicare HMO

## 2017-07-05 DIAGNOSIS — R2990 Unspecified symptoms and signs involving the nervous system: Secondary | ICD-10-CM

## 2017-07-05 DIAGNOSIS — E785 Hyperlipidemia, unspecified: Secondary | ICD-10-CM

## 2017-07-05 DIAGNOSIS — I6523 Occlusion and stenosis of bilateral carotid arteries: Secondary | ICD-10-CM

## 2017-07-05 LAB — CBC
HEMATOCRIT: 39.2 % (ref 36.0–46.0)
HEMOGLOBIN: 12.5 g/dL (ref 12.0–15.0)
MCH: 28.3 pg (ref 26.0–34.0)
MCHC: 31.9 g/dL (ref 30.0–36.0)
MCV: 88.9 fL (ref 78.0–100.0)
Platelets: 162 10*3/uL (ref 150–400)
RBC: 4.41 MIL/uL (ref 3.87–5.11)
RDW: 15.6 % — ABNORMAL HIGH (ref 11.5–15.5)
WBC: 5.4 10*3/uL (ref 4.0–10.5)

## 2017-07-05 LAB — BASIC METABOLIC PANEL
ANION GAP: 9 (ref 5–15)
BUN: 13 mg/dL (ref 6–20)
CHLORIDE: 106 mmol/L (ref 101–111)
CO2: 24 mmol/L (ref 22–32)
Calcium: 8.8 mg/dL — ABNORMAL LOW (ref 8.9–10.3)
Creatinine, Ser: 0.79 mg/dL (ref 0.44–1.00)
GFR calc non Af Amer: >60 mL/min (ref >60)
Glucose, Bld: 111 mg/dL — ABNORMAL HIGH (ref 65–99)
Potassium: 3.9 mmol/L (ref 3.5–5.1)
SODIUM: 139 mmol/L (ref 135–145)

## 2017-07-05 LAB — LIPID PANEL
CHOLESTEROL: 225 mg/dL — AB (ref 0–200)
HDL: 50 mg/dL (ref >40)
LDL CALC: 158 mg/dL — AB (ref 0–99)
TRIGLYCERIDES: 85 mg/dL (ref <150)
Total CHOL/HDL Ratio: 4.5 RATIO
VLDL: 17 mg/dL (ref 0–40)

## 2017-07-05 LAB — HEMOGLOBIN A1C
Hgb A1c MFr Bld: 6.2 % — ABNORMAL HIGH (ref 4.8–5.6)
MEAN PLASMA GLUCOSE: 131.24 mg/dL

## 2017-07-05 MED ORDER — ASPIRIN EC 81 MG PO TBEC
81.0000 mg | DELAYED_RELEASE_TABLET | Freq: Every day | ORAL | Status: DC
Start: 2017-07-05 — End: 2017-07-06
  Administered 2017-07-05 – 2017-07-06 (×2): 81 mg via ORAL
  Filled 2017-07-05 (×2): qty 1

## 2017-07-05 MED ORDER — PRAVASTATIN SODIUM 40 MG PO TABS
40.0000 mg | ORAL_TABLET | Freq: Every day | ORAL | Status: DC
  Administered 2017-07-05: 40 mg via ORAL
  Filled 2017-07-05: qty 1

## 2017-07-05 MED ORDER — PANTOPRAZOLE SODIUM 40 MG PO TBEC
40.0000 mg | DELAYED_RELEASE_TABLET | Freq: Every day | ORAL | Status: DC
  Administered 2017-07-05 – 2017-07-06 (×2): 40 mg via ORAL
  Filled 2017-07-05 (×2): qty 1

## 2017-07-05 NOTE — Progress Notes (Signed)
OT Cancellation Note  Patient Details Name: Connie Wagner MRN: [id number] DOB: 1929-01-17   Cancelled Treatment:    Reason Eval/Treat Not Completed: Patient not medically ready. Pt on bedrest. Please update activity orders when appropriate for therapy. Thanks  [facility] Barnes Florek, OT/L  [phone] 07/05/2017 07/05/2017, 7:41 AM

## 2017-07-05 NOTE — Progress Notes (Signed)
STROKE TEAM PROGRESS NOTE   SUBJECTIVE (INTERVAL HISTORY) Her daughter is at the bedside.  She had agitation and confusion yesterday, was given Haldol and Ativan.  On Seroquel 25 mg twice daily.  Stable overnight had a sitter at bedside overnight.  As per daughter, patient lives in a memory unit of assisted living.  So far seems back to baseline.   OBJECTIVE Temp:  [97.5 F (36.4 C)-98.9 F (37.2 C)] 97.7 F (36.5 C) (11/07 1540) Pulse Rate:  [41-87] 80 (11/07 1700) Resp:  [15-30] 23 (11/07 1707) BP: (94-191)/(45-174) 155/87 (11/07 1707) SpO2:  [86 %-100 %] 98 % (11/07 1700)  Recent Labs  Lab 07/04/17 0425  GLUCAP 108*   Recent Labs  Lab 07/04/17 0423 07/04/17 0433 07/05/17 0304  NA 139 141 139  K 4.0 4.0 3.9  CL 107 106 106  CO2 26  --  24  GLUCOSE 117* 114* 111*  BUN 15 16 13   CREATININE 0.93 0.80 0.79  CALCIUM 8.8*  --  8.8*   Recent Labs  Lab 07/04/17 0423  AST 16  ALT 12*  ALKPHOS 68  BILITOT 0.7  PROT 7.0  ALBUMIN 3.5   Recent Labs  Lab 07/04/17 0423 07/04/17 0433 07/05/17 0304  WBC 5.2  --  5.4  NEUTROABS 3.4  --   --   HGB 12.1 12.9 12.5  HCT 38.9 38.0 39.2  MCV 89.0  --  88.9  PLT 177  --  162   No results for input(s): CKTOTAL, CKMB, CKMBINDEX, TROPONINI in the last 168 hours. Recent Labs    07/04/17 0423  LABPROT 13.0  INR 0.99   No results for input(s): COLORURINE, LABSPEC, PHURINE, GLUCOSEU, HGBUR, BILIRUBINUR, KETONESUR, PROTEINUR, UROBILINOGEN, NITRITE, LEUKOCYTESUR in the last 72 hours.  Invalid input(s): APPERANCEUR     Component Value Date/Time   CHOL 225 (H) 07/05/2017 0304   TRIG 85 07/05/2017 0304   HDL 50 07/05/2017 0304   CHOLHDL 4.5 07/05/2017 0304   VLDL 17 07/05/2017 0304   LDLCALC 158 (H) 07/05/2017 0304   Lab Results  Component Value Date   HGBA1C 6.2 (H) 07/05/2017      Component Value Date/Time   LABOPIA NONE DETECTED 02/04/2011 1911   COCAINSCRNUR NONE DETECTED 02/04/2011 1911   COCAINSCRNUR NEGATIVE  01/22/2011 0451   LABBENZ NONE DETECTED 02/04/2011 1911   LABBENZ NEGATIVE 01/22/2011 0451   AMPHETMU NONE DETECTED 02/04/2011 1911   THCU NONE DETECTED 02/04/2011 1911   LABBARB NONE DETECTED 02/04/2011 1911    No results for input(s): ETH in the last 168 hours.  I have personally reviewed the radiological images below and agree with the radiology interpretations.  Ct Angio Head and neck W Or Wo Contrast 07/04/2017 IMPRESSION: CTA NECK: 1. 60% stenosis LEFT internal carotid artery origin. 2. 50% stenosis proximal RIGHT internal carotid artery. 3. **An incidental finding of potential clinical significance has been found. 3.6 cm ascending aorta. Recommend annual imaging followup by CTA or MRA. This recommendation follows 2010 ACCF/AHA/AATS/ACR/ASA/SCA/SCAI/SIR/STS/SVM Guidelines for the Diagnosis and Management of Patients with Thoracic Aortic Disease. Circulation.2010; 121: Y850-Y774** 4. Patulous esophagus with air-fluid level, aspiration risk. CTA HEAD: 1.  No emergent large vessel occlusion or severe stenosis. 2. Calcific atherosclerosis resulting in mild stenosis bilateral internal carotid artery's.   Ct Head Code Stroke Wo Contrast 07/04/2017 IMPRESSION: 1. No acute intracranial process. 2. 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 3. Advanced mesial temporal lobe atrophy associated with neurodegenerative disorders. 4. Stable moderate to  severe chronic small vessel ischemic disease. 5. ASPECTS is 10.   MRI not able to perform due to agitation  CT head repeat 1. No acute abnormality. 2. Chronic microvascular ischemia and atrophy, unchanged.  2D Echocardiogram  - Study truncated prematurely. Normal RV and LV function. The   aortic valve is thickened with probably mild aortic stenosis but   no Doppler measurement wil ask tech to complete study and report   as an addendum.  PHYSICAL EXAM  Temp:  [97.5 F (36.4 C)-98.9 F (37.2 C)] 97.7  F (36.5 C) (11/07 1540) Pulse Rate:  [41-87] 80 (11/07 1700) Resp:  [15-30] 23 (11/07 1707) BP: (94-191)/(45-174) 155/87 (11/07 1707) SpO2:  [86 %-100 %] 98 % (11/07 1700)  General - Well nourished, well developed, in no apparent distress.  Ophthalmologic - fundi not visualized due to noncooperation.  Cardiovascular - Regular rate and rhythm with no murmur.  Mental Status -  Orientated to self only, not orientated to time, place, or age.  No aphasia, fluent language, content of language limited.  Cranial Nerves II - XII - II - Visual field intact OU. III, IV, VI - Extraocular movements intact. V - Facial sensation intact bilaterally. VII - Facial movement intact bilaterally. VIII - Hearing & vestibular intact bilaterally. X - Palate elevates symmetrically. XI - Chin turning & shoulder shrug intact bilaterally. XII - Tongue protrusion intact.  Motor Strength - The patient's strength was symmetrical in all extremities and pronator drift was absent.  Bulk was normal and fasciculations were absent.   Motor Tone - Muscle tone was assessed at the neck and appendages and was normal.  Reflexes - The patient's reflexes were symmetrical in all extremities and she had no pathological reflexes.  Sensory - Light touch, temperature/pinprick were assessed and were symmetrical.    Coordination - not cooperative on exam tremor was absent.  Gait and Station - deferred.   ASSESSMENT/PLAN Connie Wagner is a 83 y.o. female with history of dementia living in nursing home, CAD, history of seizure, hypertension, CHF, RA admitted for right-sided arm and leg weakness and facial droop. TPA given.    Suspected right brain stroke s/p tPA   Resultant strokelike symptoms resolved, however, developed delirium  MRI pending  CTA head and neck left ICA/CCA 60% stenosis, right ICA 50% stenosis, bilateral siphon atherosclerosis  2D Echo EF 60-65%  LDL 158  HgbA1c 6.2  SCDs for VTE  prophylaxis  Diet Heart Room service appropriate? Yes; Fluid consistency: Thin   No antithrombotic prior to admission, now on ASA 81mg  for stroke prevention.  Continue aspirin on discharge  Ongoing aggressive stroke risk factor management  Therapy recommendations: Pending  Disposition: likely back to memory unit  Dementia with in-hospital delirium  History of Alzheimer's dementia  No home meds  Developed delirium with agitation, confusion and combative behavior  Haldol 5 mg IM (EKG QTC 450) and ativan  Haldol PRN  Seroquel 25 mg twice daily  off sitter  Carotid stenosis  CTA head neck left ICA/CCA 60% stenosis, right ICA 50% stenosis  Not candidate for intervention  Close monitoring as outpatient  On ASA 81mg  daily  Hypertension Stable  BP goal normotensive  Hyperlipidemia  Home meds: None  LDL 158, goal < 70  Start pravastatin 40mg  daily  Continue statin on discharge.  Other Stroke Risk Factors  Advanced age  Migraines  CAD  Other Active Problems  Single seizure episode in 2012 -no seizure medications at home  RA  history of a upper GI bleeding  Hospital day # 1  This patient is critically ill due to stroke versus TIA, status post TPA, dementia with delirium and at significant risk of neurological worsening, death form recurrent stroke, hemorrhagic conversion, hemorrhage, seizure, delirium. This patient's care requires constant monitoring of vital signs, hemodynamics, respiratory and cardiac monitoring, review of multiple databases, neurological assessment, discussion with family, other specialists and medical decision making of high complexity. I spent 35 minutes of neurocritical care time in the care of this patient.   Marvel Plan, MD PhD Stroke Neurology 07/05/2017 5:40 PM    To contact Stroke Continuity provider, please refer to [email or web address]. After hours, contact General Neurology

## 2017-07-05 NOTE — Progress Notes (Signed)
Pt has not had on their restraints all night, order expired at midnight, will not renew at this time.

## 2017-07-05 NOTE — Progress Notes (Signed)
SLP Cancellation Note  Patient Details Name: Connie Wagner MRN: [id number] DOB: 1929/08/18   Cancelled treatment:       Reason Eval/Treat Not Completed: SLP screened, no needs identified, will sign off. Daughter present, and indicates pt is at her baseline level of com/cog. No further ST intervention recommended at this venue. Daughter was encouraged to notify physician if needs arise once pt returns to normal routine. ST signing off.  Celia B. Murvin Natal [facility], CCC-SLP Speech Language Pathologist [phone]  Leigh Aurora 07/05/2017, 10:20 AM

## 2017-07-05 NOTE — Progress Notes (Signed)
PT Cancellation Note  Patient Details Name: Connie Wagner MRN: [id number] DOB: [birth year]   Cancelled Treatment:    Reason Eval/Treat Not Completed: Medical issues which prohibited therapy(pt on strict bedrest)   Fabio Asa 07/05/2017, 10:06 AM Charlotte Crumb, PT DPT  Board Certified Neurologic Specialist [phone]

## 2017-07-06 DIAGNOSIS — G459 Transient cerebral ischemic attack, unspecified: Secondary | ICD-10-CM

## 2017-07-06 DIAGNOSIS — I6523 Occlusion and stenosis of bilateral carotid arteries: Secondary | ICD-10-CM

## 2017-07-06 LAB — CBC
HEMATOCRIT: 45.3 % (ref 36.0–46.0)
HEMOGLOBIN: 14 g/dL (ref 12.0–15.0)
MCH: 28.3 pg (ref 26.0–34.0)
MCHC: 30.9 g/dL (ref 30.0–36.0)
MCV: 91.7 fL (ref 78.0–100.0)
Platelets: 129 10*3/uL — ABNORMAL LOW (ref 150–400)
RBC: 4.94 MIL/uL (ref 3.87–5.11)
RDW: 16.4 % — AB (ref 11.5–15.5)
WBC: 5.8 10*3/uL (ref 4.0–10.5)

## 2017-07-06 LAB — BASIC METABOLIC PANEL
ANION GAP: 12 (ref 5–15)
BUN: 19 mg/dL (ref 6–20)
CALCIUM: 8.6 mg/dL — AB (ref 8.9–10.3)
CO2: 20 mmol/L — AB (ref 22–32)
CREATININE: 0.92 mg/dL (ref 0.44–1.00)
Chloride: 106 mmol/L (ref 101–111)
GFR calc non Af Amer: 55 mL/min — ABNORMAL LOW (ref >60)
Glucose, Bld: 132 mg/dL — ABNORMAL HIGH (ref 65–99)
Potassium: 4.5 mmol/L (ref 3.5–5.1)
SODIUM: 138 mmol/L (ref 135–145)

## 2017-07-06 MED ORDER — PRAVASTATIN SODIUM 40 MG PO TABS: 40.0000 mg | ORAL_TABLET | Freq: Every day | ORAL | 0 refills | Status: DC

## 2017-07-06 MED ORDER — ACETAMINOPHEN 325 MG PO TABS: 650.0000 mg | ORAL_TABLET | Freq: Four times a day (QID) | ORAL | 0 refills | Status: DC | PRN

## 2017-07-06 MED ORDER — ASPIRIN 81 MG PO TBEC: 81.0000 mg | DELAYED_RELEASE_TABLET | Freq: Every day | ORAL | 0 refills | Status: DC

## 2017-07-06 MED ORDER — PANTOPRAZOLE SODIUM 40 MG PO TBEC: 40.0000 mg | DELAYED_RELEASE_TABLET | Freq: Every day | ORAL | 0 refills | Status: DC

## 2017-07-06 MED ORDER — QUETIAPINE FUMARATE 25 MG PO TABS: 25.0000 mg | ORAL_TABLET | Freq: Two times a day (BID) | ORAL | 0 refills | Status: DC

## 2017-07-06 NOTE — Clinical Social Work Note (Signed)
Clinical Social Work Assessment  Patient Details  Name: Connie Wagner MRN: [id number] Date of Birth: June 24, 1929  Date of referral:  07/06/17               Reason for consult:  Facility Placement                Permission sought to share information with:  Facility Medical sales representative, Family Supports Permission granted to share information::  Yes, Verbal Permission Granted  Name::     IT consultant::  [facility]  Relationship::  Daughter  Contact Information:     Housing/Transportation Living arrangements for the past 2 months:  Assisted Dealer of Information:  Adult Children Patient Interpreter Needed:  None Criminal Activity/Legal Involvement Pertinent to Current Situation/Hospitalization:  No - Comment as needed Significant Relationships:  Adult Children Lives with:  Self, Facility Resident Do you feel safe going back to the place where you live?  Yes Need for family participation in patient care:  Yes (Comment)(patient not oriented)  Care giving concerns:  Patient has been living at [facility] memory care and family has no concerns about care received.   Social Worker assessment / plan:  CSW spoke with patient's daughter, Misty Stanley, over the phone to discuss discharge and confirm plan to return to memory care unit. CSW discussed transportation. CSW contacted [facility] nurse to discuss discharge, and faxed over clinical information. CSW to continue to follow to facilitate return to facility.  Employment status:  Retired Database administrator PT Recommendations:  24 Hour Supervision Information / Referral to community resources:     Patient/Family's Response to care:  Patient's daughter agreeable for patient to return to memory care.  Patient/Family's Understanding of and Emotional Response to Diagnosis, Current Treatment, and Prognosis:  Patient's daughter indicated that she would be happy to provide transport for the patient back to her  facility, because she wouldn't want to pay the ambulance fee. Patient's daughter appreciated information and is happy that her mom can go back home today.  Emotional Assessment Appearance:  Appears stated age Attitude/Demeanor/Rapport:  Unable to Assess Affect (typically observed):  Unable to Assess Orientation:  Oriented to Self Alcohol / Substance use:  Not Applicable Psych involvement (Current and /or in the community):  No (Comment)  Discharge Needs  Concerns to be addressed:  Care Coordination Readmission within the last 30 days:  No Current discharge risk:  Cognitively Impaired Barriers to Discharge:  No Barriers Identified   Baldemar Lenis, LCSW 07/06/2017, 11:09 AM

## 2017-07-06 NOTE — Progress Notes (Signed)
Discharge to: [facility] Anticipated discharge date: 07/06/17 Family notified: Yes, at bedside Transportation by: Family car  Report #: [phone], ask for Myesha  CSW signing off.  Blenda Nicely LCSW [phone]

## 2017-07-06 NOTE — Evaluation (Signed)
Physical Therapy Evaluation Patient Details Name: Connie Wagner MRN: [id number] DOB: 08-25-1929 Today's Date: 07/06/2017   History of Present Illness  Pt is an 83 y.o. female presenting from [facility] with acute onset of right arm and leg weakness with facial droop. PMH significant for several stroke factors including CAD, carotid stenosis, ischemic cardiomyopathy, as well as Alzheimer's dementia. CT scan revealed no acute infarct. CT revealed stable moderate to severe atrophy, superimposed suspected normal, during this admission pt developed in-hospital delerium     Clinical Impression  Patient evaluated by Physical Therapy with no further acute PT needs identified. All education has been completed and the patient has no further questions. Pt is at baseline level of function, and is minA for bed mobility, supervision for transfers and min guard for ambulation of 20 feet with RW.  See below for any follow-up Physical Therapy or equipment needs. PT is signing off. Thank you for this referral.     Follow Up Recommendations No PT follow up    Equipment Recommendations  None recommended by PT       Precautions / Restrictions Precautions Precautions: Fall Restrictions Weight Bearing Restrictions: No      Mobility  Bed Mobility Overal bed mobility: Needs Assistance Bed Mobility: Supine to Sit;Sit to Supine     Supine to sit: Supervision Sit to supine: Min assist   General bed mobility comments: HoB elevated,pt able to come to upright and scoot to EoB, pt required minA to manage LE back into bed  Transfers Overall transfer level: Needs assistance Equipment used: Rolling walker (2 wheeled) Transfers: Sit to/from Stand Sit to Stand: Supervision         General transfer comment: supervision for safety, good power up and steading with RW  Ambulation/Gait Ambulation/Gait assistance: Min guard Ambulation Distance (Feet): 20 Feet Assistive device: Rolling walker (2  wheeled) Gait Pattern/deviations: Step-through pattern;Decreased step length - left;Decreased stance time - left;Decreased weight shift to left;Trunk flexed;Shuffle Gait velocity: slowed Gait velocity interpretation: Below normal speed for age/gender General Gait Details: hands on min guard for safety, decreased L LE advancement due to decreased foot clearance producing minor instability, no overt LoB or knee buckling    Modified Rankin (Stroke Patients Only) Modified Rankin (Stroke Patients Only) Pre-Morbid Rankin Score: No significant disability Modified Rankin: No significant disability     Balance Overall balance assessment: Needs assistance Sitting-balance support: No upper extremity supported;Feet supported Sitting balance-Leahy Scale: Good Sitting balance - Comments: able to reach down and pull up socks in sitting   Standing balance support: Bilateral upper extremity supported Standing balance-Leahy Scale: Poor Standing balance comment: requires RW assist for balance                             Pertinent Vitals/Pain Pain Assessment: No/denies pain    Home Living Family/patient expects to be discharged to:: Other (Comment)([facility])                      Prior Function Level of Independence: Needs assistance   Gait / Transfers Assistance Needed: supervision for ambulation with RW  ADL's / Homemaking Assistance Needed: able to assist in dressing, iADLs provided for           Extremity/Trunk Assessment   Upper Extremity Assessment Upper Extremity Assessment: Defer to OT evaluation    Lower Extremity Assessment Lower Extremity Assessment: Generalized weakness    Cervical / Trunk  Assessment Cervical / Trunk Assessment: Kyphotic  Communication   Communication: No difficulties  Cognition Arousal/Alertness: Awake/alert Behavior During Therapy: WFL for tasks assessed/performed Overall Cognitive Status: History of cognitive  impairments - at baseline                                 General Comments: pt experienced delrium in hospital, however appears to be at baseline, no family present, currently oriented to self              Assessment/Plan    PT Assessment Patent does not need any further PT services         PT Goals (Current goals can be found in the Care Plan section)  Acute Rehab PT Goals Patient Stated Goal: non stated PT Goal Formulation: Patient unable to participate in goal setting     AM-PAC PT "6 Clicks" Daily Activity  Outcome Measure Difficulty turning over in bed (including adjusting bedclothes, sheets and blankets)?: A Little Difficulty moving from lying on back to sitting on the side of the bed? : A Lot Difficulty sitting down on and standing up from a chair with arms (e.g., wheelchair, bedside commode, etc,.)?: A Little Help needed moving to and from a bed to chair (including a wheelchair)?: A Little Help needed walking in hospital room?: A Little Help needed climbing 3-5 steps with a railing? : A Lot 6 Click Score: 16    End of Session Equipment Utilized During Treatment: Gait belt Activity Tolerance: Patient tolerated treatment well Patient left: in bed;with call bell/phone within reach;with bed alarm set;with nursing/sitter in room Nurse Communication: Mobility status      Time: 8099-8338 PT Time Calculation (min) (ACUTE ONLY): 26 min   Charges:   PT Evaluation $PT Eval Moderate Complexity: 1 Mod     PT G Codes:        Connie Wagner B. Beverely Risen PT, DPT Acute Rehabilitation  [phone] Pager [phone]    Elon Alas Fleet 07/06/2017, 10:59 AM

## 2017-07-06 NOTE — Evaluation (Addendum)
Occupational Therapy Evaluation and Discharge Patient Details Name: Connie Wagner MRN: [id number] DOB: [birth year] Today's Date: 07/06/2017    History of Present Illness Pt is an 83 y.o. female presenting from [place] with acute onset of right arm and leg weakness with facial droop. PMH significant for several stroke factors including CAD, carotid stenosis, ischemic cardiomyopathy, as well as Alzheimer's dementia. CT scan revealed no acute infarct. CT revealed stable moderate to severe atrophy, superimposed suspected normal, during this admission pt developed in-hospital delerium    Clinical Impression   Pt currently requires min guard assist for functional mobility with use of RW and min assist overall for ADL. Pt presenting with generalized weakness, deconditioning, and cognitive deficits but unsure of pts baseline. Pt planning to return to Memory Care Unit at ALF upon d/c. All further OT needs can be met at the next venue of care; will sign off from OT standpoint at this time. Please re-consult if needs change. Thank you for this referral.    Follow Up Recommendations  No OT follow up;Supervision/Assistance - 24 hour    Equipment Recommendations  None recommended by OT    Recommendations for Other Services       Precautions / Restrictions Precautions Precautions: Fall Restrictions Weight Bearing Restrictions: No      Mobility Bed Mobility Overal bed mobility: Needs Assistance Bed Mobility: Supine to Sit;Sit to Supine     Supine to sit: Supervision Sit to supine: Min assist   General bed mobility comments: HoB elevated,pt able to come to upright and scoot to EoB, pt required minA to manage LE back into bed  Transfers Overall transfer level: Needs assistance Equipment used: Rolling walker (2 wheeled) Transfers: Sit to/from Stand Sit to Stand: Supervision         General transfer comment: supervision for safety, good power up and steading with RW    Balance  Overall balance assessment: Needs assistance Sitting-balance support: No upper extremity supported;Feet supported Sitting balance-Leahy Scale: Good Sitting balance - Comments: able to reach down and pull up socks in sitting   Standing balance support: Bilateral upper extremity supported Standing balance-Leahy Scale: Poor Standing balance comment: requires RW assist for balance                           ADL either performed or assessed with clinical judgement   ADL Overall ADL's : Needs assistance/impaired Eating/Feeding: Set up;Supervision/ safety;Sitting   Grooming: Set up;Supervision/safety;Sitting   Upper Body Bathing: Minimal assistance;Sitting   Lower Body Bathing: Minimal assistance;Sit to/from stand   Upper Body Dressing : Minimal assistance;Sitting   Lower Body Dressing: Minimal assistance;Sit to/from stand   Toilet Transfer: Min guard;Ambulation           Functional mobility during ADLs: Min guard;Rolling walker       Vision Baseline Vision/History: Wears glasses Vision Assessment?: No apparent visual deficits     Perception     Praxis      Pertinent Vitals/Pain Pain Assessment: No/denies pain     Hand Dominance     Extremity/Trunk Assessment Upper Extremity Assessment Upper Extremity Assessment: Generalized weakness   Lower Extremity Assessment Lower Extremity Assessment: Defer to PT evaluation   Cervical / Trunk Assessment Cervical / Trunk Assessment: Kyphotic   Communication Communication Communication: No difficulties   Cognition Arousal/Alertness: Awake/alert Behavior During Therapy: WFL for tasks assessed/performed Overall Cognitive Status: History of cognitive impairments - at baseline  General Comments: pt experienced delrium in hospital, however appears to be at baseline, no family present, currently oriented to self    General Comments       Exercises     Shoulder  Instructions      Home Living Family/patient expects to be discharged to:: Assisted living([facility])                                        Prior Functioning/Environment Level of Independence: Needs assistance  Gait / Transfers Assistance Needed: supervision for ambulation with RW ADL's / Homemaking Assistance Needed: able to assist in dressing, iADLs provided for            OT Problem List:        OT Treatment/Interventions:      OT Goals(Current goals can be found in the care plan section) Acute Rehab OT Goals Patient Stated Goal: none stated OT Goal Formulation: All assessment and education complete, DC therapy  OT Frequency:     Barriers to D/C:            Co-evaluation PT/OT/SLP Co-Evaluation/Treatment: Yes Reason for Co-Treatment: To address functional/ADL transfers   OT goals addressed during session: ADL's and self-care      AM-PAC PT "6 Clicks" Daily Activity     Outcome Measure Help from another person eating meals?: A Little Help from another person taking care of personal grooming?: A Little Help from another person toileting, which includes using toliet, bedpan, or urinal?: A Little Help from another person bathing (including washing, rinsing, drying)?: A Little Help from another person to put on and taking off regular upper body clothing?: A Little Help from another person to put on and taking off regular lower body clothing?: A Little 6 Click Score: 18   End of Session Equipment Utilized During Treatment: Gait belt;Rolling walker Nurse Communication: Mobility status  Activity Tolerance: Patient tolerated treatment well Patient left: in bed;with call bell/phone within reach;with bed alarm set;with nursing/sitter in room  OT Visit Diagnosis: Unsteadiness on feet (R26.81);Other abnormalities of gait and mobility (R26.89)                Time: 6073-7106 OT Time Calculation (min): 21 min Charges:  OT General  Charges $OT Visit: 1 Visit OT Evaluation $OT Eval Moderate Complexity: 1 Mod G-Codes:     Dalexa Gentz A. Ulice Brilliant, M.S., OTR/L Pager: [place] 07/06/2017, 11:37 AM

## 2017-07-06 NOTE — NC FL2 (Signed)
[place] MEDICAID FL2 LEVEL OF CARE SCREENING TOOL     IDENTIFICATION  Patient Name: Connie Wagner Birthdate: April 24, 1929 Sex: female Admission Date (Current Location): 07/04/2017  [facility] and [place] Number:  Producer, television/film/video and Address:  [facility] [place], [address], [place], [place] [address]      Provider Number: [id number]  Attending Physician Name and Address:  Marvel Plan, MD  Relative Name and Phone Number:       Current Level of Care: Hospital Recommended Level of Care: Memory Care Prior Approval Number:    Date Approved/Denied:   PASRR Number:    Discharge Plan: Other (Comment)(Memory Care)    Current Diagnoses: Patient Active Problem List   Diagnosis Date Noted  . Carotid stenosis, bilateral 07/05/2017  . Cerebral embolism with cerebral infarction 07/04/2017  . Stroke (cerebrum) (HCC) 07/04/2017  . Physical deconditioning 10/23/2012  . Cardiogenic shock (HCC) 10/23/2012  . Wenckebach second degree AV block 10/20/2012  . Complete heart block (HCC) 10/19/2012  . Acute renal failure (HCC) 10/19/2012  . Acute lower UTI 10/19/2012  . Acute systolic CHF (congestive heart failure) (HCC) 10/19/2012  . Normocytic anemia 10/19/2012  . S/P drug eluting coronary stent placement 10/19/2012  . Coronary artery disease 10/19/2012  . Acute MI, inferior wall, initial episode of care (HCC) 10/18/2012  . Urinary tract infection, E. coli 08/21/2012  . Hypertension   . Dementia   . Depression   . Prepyloric ulcer 09/15/2011  . Hypotension due to blood loss 09/15/2011  . Leukocytosis 09/15/2011  . Extrarenal azotemia 09/15/2011  . GI bleeding 09/14/2011  . Anemia associated with acute blood loss 09/14/2011  . Tachycardia 09/14/2011  . HTN (hypertension) 09/14/2011  . Anxiety 09/14/2011  . ETOH abuse 09/14/2011  . 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 09/14/2011     Orientation RESPIRATION BLADDER Height & Weight     Self  Normal Continent Weight: 161 lb 5 oz (73.2 kg) Height:  5\' 2"  (157.5 cm)  BEHAVIORAL SYMPTOMS/MOOD NEUROLOGICAL BOWEL NUTRITION STATUS      Continent Diet(low sodium)  AMBULATORY STATUS COMMUNICATION OF NEEDS Skin   Limited Assist Verbally Normal                       Personal Care Assistance Level of Assistance  Bathing, Dressing Bathing Assistance: Limited assistance   Dressing Assistance: Limited assistance     Functional Limitations Info  Sight, Hearing, Speech Sight Info: Adequate Hearing Info: Adequate Speech Info: Adequate    SPECIAL CARE FACTORS FREQUENCY                       Contractures Contractures Info: Not present    Additional Factors Info  Code Status, Allergies, Isolation Precautions Code Status Info: Partial Allergies Info: Augmentin Amoxicillin-pot Clavulanate, Codeine, Cozaar Losartan Potassium, Evista Raloxifene Hcl, Flagyl Metronidazole, Iron, Lexapro Escitalopram Oxalate, Tetracyclines & Related, Penicillins, Sulfa Drugs Cross Reactors     Isolation Precautions Info: MRSA     Current Medications (07/06/2017):  This is the current hospital active medication list Current Facility-Administered Medications  Medication Dose Route Frequency Provider Last Rate Last Dose  . acetaminophen (TYLENOL) tablet 650 mg  650 mg Oral Q6H PRN 13/03/2017, MD      . aspirin EC tablet 81 mg  81 mg Oral Daily Caryl Pina, MD   81 mg at 07/06/17 1050  . haloperidol  lactate (HALDOL) injection 5 mg  5 mg Intramuscular Q8H PRN Marvel Plan, MD   5 mg at 07/04/17 1232  . pantoprazole (PROTONIX) EC tablet 40 mg  40 mg Oral Daily Marvel Plan, MD   40 mg at 07/06/17 1050  . pravastatin (PRAVACHOL) tablet 40 mg  40 mg Oral q1800 Marvel Plan, MD   40 mg at 07/05/17 1912  . QUEtiapine (SEROQUEL) tablet 25 mg  25 mg Oral BID Marvel Plan, MD   25 mg at 07/06/17 1046  . senna-docusate (Senokot-S) tablet 1  tablet  1 tablet Oral QHS PRN Caryl Pina, MD       Facility-Administered Medications Ordered in Other Encounters  Medication Dose Route Frequency Provider Last Rate Last Dose  . bupivacaine (MARCAINE) 0.5 % injection    PRN Sheldon Silvan, MD   20 mL at 08/17/12 1922     Discharge Medications: Please see discharge summary for a list of discharge medications.  Relevant Imaging Results:  Relevant Lab Results:   Additional Information SS#: [id number]  Baldemar Lenis, LCSW

## 2017-07-06 NOTE — Progress Notes (Addendum)
Pt d/c to [facility], pt is stable, d/c instruction called in to nurse [facility] LPN. Pt is transported out of the hospital by daughter.

## 2017-07-06 NOTE — NC FL2 (Signed)
[place] MEDICAID FL2 LEVEL OF CARE SCREENING TOOL     IDENTIFICATION  Patient Name: Connie Wagner Birthdate: 08/01/1929 Sex: female Admission Date (Current Location): 07/04/2017  [facility] and [place] Number:  Producer, television/film/video and Address:  [facility] [place]. [facility], [address] [place], [place] [address]      Provider Number: [id number]  Attending Physician Name and Address:  Marvel Plan, MD  Relative Name and Phone Number:       Current Level of Care: Hospital Recommended Level of Care: Memory Care Prior Approval Number:    Date Approved/Denied:   PASRR Number:    Discharge Plan: Other (Comment)(Memory Care)    Current Diagnoses: Patient Active Problem List   Diagnosis Date Noted  . Carotid stenosis, bilateral 07/05/2017  . Cerebral embolism with cerebral infarction 07/04/2017  . Stroke (cerebrum) (HCC) 07/04/2017  . Physical deconditioning 10/23/2012  . Cardiogenic shock (HCC) 10/23/2012  . Wenckebach second degree AV block 10/20/2012  . Complete heart block (HCC) 10/19/2012  . Acute renal failure (HCC) 10/19/2012  . Acute lower UTI 10/19/2012  . Acute systolic CHF (congestive heart failure) (HCC) 10/19/2012  . Normocytic anemia 10/19/2012  . S/P drug eluting coronary stent placement 10/19/2012  . Coronary artery disease 10/19/2012  . Acute MI, inferior wall, initial episode of care (HCC) 10/18/2012  . Urinary tract infection, E. coli 08/21/2012  . Hypertension   . Dementia   . Depression   . Prepyloric ulcer 09/15/2011  . Hypotension due to blood loss 09/15/2011  . Leukocytosis 09/15/2011  . Extrarenal azotemia 09/15/2011  . GI bleeding 09/14/2011  . Anemia associated with acute blood loss 09/14/2011  . Tachycardia 09/14/2011  . HTN (hypertension) 09/14/2011  . Anxiety 09/14/2011  . ETOH abuse 09/14/2011  . 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 09/14/2011     Orientation RESPIRATION BLADDER Height & Weight     Self  Normal Continent Weight: 161 lb 5 oz (73.2 kg) Height:  5\' 2"  (157.5 cm)  BEHAVIORAL SYMPTOMS/MOOD NEUROLOGICAL BOWEL NUTRITION STATUS      Continent Diet(no added salt)  AMBULATORY STATUS COMMUNICATION OF NEEDS Skin   Limited Assist Verbally Normal                       Personal Care Assistance Level of Assistance  Bathing, Dressing Bathing Assistance: Limited assistance   Dressing Assistance: Limited assistance     Functional Limitations Info  Sight, Hearing, Speech Sight Info: Adequate Hearing Info: Adequate Speech Info: Adequate    SPECIAL CARE FACTORS FREQUENCY                       Contractures Contractures Info: Not present    Additional Factors Info  Code Status, Allergies, Isolation Precautions Code Status Info: Partial Allergies Info: Augmentin Amoxicillin-pot Clavulanate, Codeine, Cozaar Losartan Potassium, Evista Raloxifene Hcl, Flagyl Metronidazole, Iron, Lexapro Escitalopram Oxalate, Tetracyclines & Related, Penicillins, Sulfa Drugs Cross Reactors     Isolation Precautions Info: MRSA     Current Medications (07/06/2017):  This is the current hospital active medication list Current Facility-Administered Medications  Medication Dose Route Frequency Provider Last Rate Last Dose  . acetaminophen (TYLENOL) tablet 650 mg  650 mg Oral Q6H PRN 13/03/2017, MD      . aspirin EC tablet 81 mg  81 mg Oral Daily Caryl Pina, MD   81 mg at 07/06/17 1050  .  haloperidol lactate (HALDOL) injection 5 mg  5 mg Intramuscular Q8H PRN Marvel Plan, MD   5 mg at 07/04/17 1232  . pantoprazole (PROTONIX) EC tablet 40 mg  40 mg Oral Daily Marvel Plan, MD   40 mg at 07/06/17 1050  . pravastatin (PRAVACHOL) tablet 40 mg  40 mg Oral q1800 Marvel Plan, MD   40 mg at 07/05/17 1912  . QUEtiapine (SEROQUEL) tablet 25 mg  25 mg Oral BID Marvel Plan, MD   25 mg at 07/06/17 1046  . senna-docusate (Senokot-S) tablet  1 tablet  1 tablet Oral QHS PRN Caryl Pina, MD       Facility-Administered Medications Ordered in Other Encounters  Medication Dose Route Frequency Provider Last Rate Last Dose  . bupivacaine (MARCAINE) 0.5 % injection    PRN Sheldon Silvan, MD   20 mL at 08/17/12 1922     Discharge Medications: Please see discharge summary for a list of discharge medications.  Relevant Imaging Results:  Relevant Lab Results:   Additional Information SS#: [id number]  Baldemar Lenis, LCSW

## 2017-07-06 NOTE — Care Management Note (Signed)
Case Management Note  Patient Details  Name: Connie Wagner MRN: [id number] Date of Birth: 1929/08/27  Subjective/Objective:                    Action/Plan: Plan is for patient to return to [facility]F today. No further needs per CM.  Expected Discharge Date:  07/06/17               Expected Discharge Plan:  Skilled Nursing Facility  In-House Referral:  Clinical Social Work  Discharge planning Services  CM Consult  Post Acute Care Choice:    Choice offered to:     DME Arranged:    DME Agency:     HH Arranged:    HH Agency:     Status of Service:  Completed, signed off  If discussed at [employer], dates discussed:    Additional Comments:  Kermit Balo, RN 07/06/2017, 10:45 AM

## 2017-07-06 NOTE — Discharge Summary (Signed)
Stroke Discharge Summary  Patient ID: Connie Wagner   MRN: [id number]      DOB: 09/11/1928  Date of Admission: 07/04/2017 Date of Discharge: 07/06/2017  Attending Physician:  Marvel Plan, MD, Stroke MD Consultant(s):    None Patient's PCP:  Merlene Laughter, MD  DISCHARGE DIAGNOSIS:  Active Problems:   Suspected stroke s/p tPA   Carotid stenosis, bilaterally   Dementia with in-hospital delirium   Hypertension   Hyperlipidemia  Past Medical History:  Diagnosis Date  . Anemia    iron deficient  . Anxiety   . CAD (coronary artery disease)    a. s/p Inf STEMI 2/14 => LHC 10/18/12: Diffuse distal LM 30%, LAD 50% calcified stenosis just after first septal perforator, diagonal branches with mild 20-30% stenosis, mid RCA 99%. PCI: Promus Premier DES to the mid RCA  . Carotid stenosis    a. [facility] screening done in 2009 with carotid stenosis noted;  b.  Carotid dopplers 3/14: R 0-39%, L 40-59%, repeat 1 year  . Chronic lower back pain   . 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 Dementia    needs redirection freq.  . Depression   . Hypertension   . Ischemic cardiomyopathy    a. Echocardiogram 10/18/12: Mild LVH, inferior and inferoseptal AK, posterior HK, EF 45%, grade 1 diastolic dysfunction, aortic sclerosis without stenosis, mild MR, mild LAE, RV systolic function moderately reduced (pacer wire in right ventricle), PASP 32  . Migraines    "haven't had one since going thru menopause"  . Rheumatoid arthritis(714.0)   . Upper GI bleeding 09/14/11   "have had it once before today"   Past Surgical History:  Procedure Laterality Date  . BACK SURGERY    . BREAST LUMPECTOMY     "benign tumor removed"  . HEMATOMA EVACUATION  10/2002   lumbar  . INCISION AND DRAINAGE OF WOUND  10/2002   lumbar wound infection  . JOINT REPLACEMENT    . KNEE ARTHROSCOPY  01/2001   right  . LUMBAR DISC SURGERY  09/2002    Hemilaminectomy, L3, complete laminectomy, L4, hemi- semilaminectomy, L5 and right S1, and diskectomy, L5-S1.  Marland Kitchen LUMBAR LAMINECTOMY  10/2003   for epidural abscess evacuation; L4-5  . TOTAL KNEE ARTHROPLASTY  01/2002   right    Allergies as of 07/06/2017      Reactions   Augmentin [amoxicillin-pot Clavulanate] Other (See Comments)   On MAR   Codeine Other (See Comments)   On MAR   Cozaar [losartan Potassium] Other (See Comments)   On MAR   Evista [raloxifene Hcl] Other (See Comments)   On MAR   Flagyl [metronidazole] Other (See Comments)   On MAR   Iron Nausea And Vomiting   Loose stool   Lexapro [escitalopram Oxalate] Other (See Comments)   On MAR   Tetracyclines & Related Other (See Comments)   On MAR   Penicillins Rash   Has patient had a PCN reaction causing immediate rash, facial/tongue/throat swelling, SOB or lightheadedness with hypotension: No Has patient had a PCN reaction causing severe rash involving mucus membranes or skin necrosis: No Has patient had a PCN reaction that required hospitalization No Has patient had a PCN reaction occurring within the last 10 years: No If all of the above answers are "NO", then may proceed with Cephalosporin use.   Sulfa Drugs Cross Reactors Rash      Medication List  STOP taking these medications   traZODone 50 MG tablet Commonly known as:  DESYREL     TAKE these medications   acetaminophen 325 MG tablet Commonly known as:  TYLENOL Take 2 tablets (650 mg total) every 6 (six) hours as needed by mouth for mild pain or fever.   aspirin 81 MG EC tablet Take 1 tablet (81 mg total) daily by mouth.   pantoprazole 40 MG tablet Commonly known as:  PROTONIX Take 1 tablet (40 mg total) daily by mouth.   pravastatin 40 MG tablet Commonly known as:  PRAVACHOL Take 1 tablet (40 mg total) daily at 6 PM by mouth.   QUEtiapine 25 MG tablet Commonly known as:  SEROQUEL Take 1 tablet (25 mg total) 2 (two) times daily by mouth.       LABORATORY STUDIES CBC    Component Value Date/Time   WBC 5.8 07/06/2017 0758   RBC 4.94 07/06/2017 0758   HGB 14.0 07/06/2017 0758   HCT 45.3 07/06/2017 0758   PLT 129 (L) 07/06/2017 0758   MCV 91.7 07/06/2017 0758   MCH 28.3 07/06/2017 0758   MCHC 30.9 07/06/2017 0758   RDW 16.4 (H) 07/06/2017 0758   LYMPHSABS 0.9 07/04/2017 0423   MONOABS 0.7 07/04/2017 0423   EOSABS 0.2 07/04/2017 0423   BASOSABS 0.0 07/04/2017 0423   CMP    Component Value Date/Time   NA 138 07/06/2017 0758   K 4.5 07/06/2017 0758   CL 106 07/06/2017 0758   CO2 20 (L) 07/06/2017 0758   GLUCOSE 132 (H) 07/06/2017 0758   BUN 19 07/06/2017 0758   CREATININE 0.92 07/06/2017 0758   CALCIUM 8.6 (L) 07/06/2017 0758   PROT 7.0 07/04/2017 0423   ALBUMIN 3.5 07/04/2017 0423   AST 16 07/04/2017 0423   ALT 12 (L) 07/04/2017 0423   ALKPHOS 68 07/04/2017 0423   BILITOT 0.7 07/04/2017 0423   GFRNONAA 55 (L) 07/06/2017 0758   GFRAA >60 07/06/2017 0758   COAGS Lab Results  Component Value Date   INR 0.99 07/04/2017   INR 1.20 10/18/2012   INR 1.00 08/14/2012   Lipid Panel    Component Value Date/Time   CHOL 225 (H) 07/05/2017 0304   TRIG 85 07/05/2017 0304   HDL 50 07/05/2017 0304   CHOLHDL 4.5 07/05/2017 0304   VLDL 17 07/05/2017 0304   LDLCALC 158 (H) 07/05/2017 0304   HgbA1C  Lab Results  Component Value Date   HGBA1C 6.2 (H) 07/05/2017   Urinalysis    Component Value Date/Time   COLORURINE YELLOW 01/16/2016 0118   APPEARANCEUR CLEAR 01/16/2016 0118   LABSPEC 1.023 01/16/2016 0118   PHURINE 5.0 01/16/2016 0118   GLUCOSEU NEGATIVE 01/16/2016 0118   HGBUR MODERATE (A) 01/16/2016 0118   BILIRUBINUR NEGATIVE 01/16/2016 0118   KETONESUR NEGATIVE 01/16/2016 0118   PROTEINUR NEGATIVE 01/16/2016 0118   UROBILINOGEN 0.2 06/08/2015 1700   NITRITE NEGATIVE 01/16/2016 0118   LEUKOCYTESUR NEGATIVE 01/16/2016 0118   Urine Drug Screen     Component Value Date/Time   LABOPIA NONE DETECTED  02/04/2011 1911   COCAINSCRNUR NONE DETECTED 02/04/2011 1911   COCAINSCRNUR NEGATIVE 01/22/2011 0451   LABBENZ NONE DETECTED 02/04/2011 1911   LABBENZ NEGATIVE 01/22/2011 0451   AMPHETMU NONE DETECTED 02/04/2011 1911   THCU NONE DETECTED 02/04/2011 1911   LABBARB NONE DETECTED 02/04/2011 1911    Alcohol Level    Component Value Date/Time   ETH <11 (H) 02/04/2011 1931   SIGNIFICANT DIAGNOSTIC STUDIES Ct  Angio Head and neck W Or Wo Contrast 07/04/2017 IMPRESSION: CTA NECK: 1. 60% stenosis LEFT internal carotid artery origin. 2. 50% stenosis proximal RIGHT internal carotid artery. 3. **An incidental finding of potential clinical significance has been found. 3.6 cm ascending aorta. Recommend annual imaging followup by CTA or MRA. This recommendation follows 2010 ACCF/AHA/AATS/ACR/ASA/SCA/SCAI/SIR/STS/SVM Guidelines for the Diagnosis and Management of Patients with Thoracic Aortic Disease. Circulation.2010; 121: N829-F621** 4. Patulous esophagus with air-fluid level, aspiration risk. CTA HEAD: 1.  No emergent large vessel occlusion or severe stenosis. 2. Calcific atherosclerosis resulting in mild stenosis bilateral internal carotid artery's.   Ct Head Code Stroke Wo Contrast 07/04/2017 IMPRESSION: 1. No acute intracranial process. 2. 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 3. Advanced mesial temporal lobe atrophy associated with neurodegenerative disorders. 4. Stable moderate to severe chronic small vessel ischemic disease. 5. ASPECTS is 10.   MRI not able to perform due to agitation  CT head repeat 1. No acute abnormality. 2. Chronic microvascular ischemia and atrophy, unchanged.  2D Echocardiogram  - Study truncated prematurely. Normal RV and LV function. The aortic valve is thickened with probably mild aortic stenosis but no Doppler measurement wil ask tech to complete study and report as an addendum.   HISTORY OF PRESENT  ILLNESS DEAUNDRA DUPRIEST is an 83 y.o. female presenting from [facility] with acute onset of right arm and leg weakness with facial droop. LSN of 0315 is the same as TOSO. She has no prior history of stroke and is not on a blood thinner. Takes melatonin as her only medication per daughter; only medication on her list at the facility is Trazodone. She has Alzheimer's dementia. PMHx also includes several stroke risk factors.  LSN: [id number] tPA Given: Yes   HOSPITAL COURSE Ms. LANINA LARRANAGA is a 83 y.o. female with history of dementia living in nursing home, CAD, history of seizure, hypertension, CHF, RA admitted for right-sided arm and leg weakness and facial droop. TPA given.    Suspected right brain stroke s/p tPA   Resultant strokelike symptoms resolved, however, developed delirium  MRI pending  CTA head and neck left ICA/CCA 60% stenosis, right ICA 50% stenosis, bilateral siphon atherosclerosis  2D Echo EF 60-65%  LDL 158  HgbA1c 6.2  SCDs for VTE prophylaxis  Diet Heart Room service appropriate? Yes; Fluid consistency: Thin   No antithrombotic prior to admission, now on ASA 81mg  for stroke prevention.  Continue aspirin on discharge  Ongoing aggressive stroke risk factor management  Therapy recommendations: HH PT/OT  Disposition: Back to memory unit at [facility]  Dementia with in-hospital delirium  History of Alzheimer's dementia  No home meds  Developed delirium with agitation, confusion and combative behavior 07/04/17  Haldol 5 mg IM (EKG QTC 450) and ativan  Haldol PRN  Seroquel 25 mg twice daily  Off sitter for more than 24 hours  Carotid stenosis  CTA head neck left ICA/CCA 60% stenosis, right ICA 50% stenosis  Not candidate for intervention  Close monitoring as outpatient  On ASA 81mg  daily  Hypertension  Stable  BP goal normotensive  Hyperlipidemia  Home meds: None  LDL 158, goal < 70  Start pravastatin 40mg  daily  Continue  statin on discharge.  Other Stroke Risk Factors  Advanced age  Migraines  CAD  Other Active Problems  Single seizure episode in 2012 -no seizure medications at home  RA  history of a upper GI bleeding   DISCHARGE EXAM Blood pressure  115/67, pulse 74, temperature (!) 97.5 F (36.4 C), temperature source Oral, resp. rate 20, height 5\' 2"  (1.575 m), weight 73.2 kg (161 lb 5 oz), SpO2 94 %. General - Well nourished, well developed, in no apparent distress. Respiratory - Lungs clear bilaterally, No wheezing Cardiovascular - Regular rate and rhythm with no murmur.  Mental Status -  Orientated to self only, not orientated to time, place, or age.  No aphasia, fluent language, content of language limited. No fully cooperative with exam  Cranial Nerves II - XII - II - Visual field intact OU. III, IV, VI - Extraocular movements intact. V - Facial sensation intact bilaterally. VII - Facial movement intact bilaterally. VIII - Hearing & vestibular intact bilaterally. X - Palate elevates symmetrically. XI - Chin turning & shoulder shrug intact bilaterally. XII - Tongue protrusion intact.  Motor Strength - The patient's strength was symmetrical in all extremities and pronator drift was absent.  Bulk was normal and fasciculations were absent.   Motor Tone - Muscle tone was assessed at the neck and appendages and was normal. Sensory - Light touch, temperature/pinprick were assessed and were symmetrical.   Coordination - not cooperative on exam tremor was absent. Gait and Station - deferred.  Discharge Diet   Diet Heart Room service appropriate? Yes; Fluid consistency: Thin liquids  DISCHARGE PLAN  Disposition:  [facility]  aspirin 81 mg daily for secondary stroke prevention.  Ongoing risk factor control by Primary Care Physician at time of discharge  Follow-up [place], Hal, MD in 2 weeks.  Follow-up with , NP, Stroke Clinic in 6 weeks, office to  schedule an appointment.  Greater than 30 minutes were spent preparing discharge.  Darrol Angel, MD PhD Stroke Neurology 07/06/2017 6:33 PM

## 2017-07-24 DIAGNOSIS — I498 Other specified cardiac arrhythmias: Secondary | ICD-10-CM | POA: Diagnosis not present

## 2017-07-24 DIAGNOSIS — I10 Essential (primary) hypertension: Secondary | ICD-10-CM | POA: Diagnosis not present

## 2017-07-24 DIAGNOSIS — G459 Transient cerebral ischemic attack, unspecified: Secondary | ICD-10-CM | POA: Diagnosis not present

## 2017-07-24 DIAGNOSIS — G301 Alzheimer's disease with late onset: Secondary | ICD-10-CM | POA: Diagnosis not present

## 2017-07-24 DIAGNOSIS — Z23 Encounter for immunization: Secondary | ICD-10-CM | POA: Diagnosis not present

## 2017-10-11 DIAGNOSIS — R69 Illness, unspecified: Secondary | ICD-10-CM | POA: Diagnosis not present

## 2017-10-11 DIAGNOSIS — Z23 Encounter for immunization: Secondary | ICD-10-CM | POA: Diagnosis not present

## 2017-10-11 DIAGNOSIS — E669 Obesity, unspecified: Secondary | ICD-10-CM | POA: Diagnosis not present

## 2017-10-11 DIAGNOSIS — Z79899 Other long term (current) drug therapy: Secondary | ICD-10-CM | POA: Diagnosis not present

## 2017-10-11 DIAGNOSIS — Z0000 Encounter for general adult medical examination without abnormal findings: Secondary | ICD-10-CM | POA: Diagnosis not present

## 2017-10-11 DIAGNOSIS — G301 Alzheimer's disease with late onset: Secondary | ICD-10-CM | POA: Diagnosis not present

## 2017-10-11 DIAGNOSIS — E7800 Pure hypercholesterolemia, unspecified: Secondary | ICD-10-CM | POA: Diagnosis not present

## 2017-10-11 DIAGNOSIS — I10 Essential (primary) hypertension: Secondary | ICD-10-CM | POA: Diagnosis not present

## 2017-10-16 ENCOUNTER — Other Ambulatory Visit: Payer: Self-pay

## 2017-10-17 ENCOUNTER — Other Ambulatory Visit: Payer: Self-pay

## 2017-10-18 ENCOUNTER — Other Ambulatory Visit: Payer: Self-pay

## 2017-10-18 NOTE — Patient Outreach (Signed)
Contact number provided at discharge were incorrect/not working. Unable to obtain mRS. mRs = 7

## 2017-10-22 ENCOUNTER — Inpatient Hospital Stay (HOSPITAL_COMMUNITY)
Admission: EM | Admit: 2017-10-22 | Discharge: 2017-10-30 | DRG: 194 | Disposition: A | Discharge status: Skilled Nursing Facility | Payer: Medicare HMO | Attending: Internal Medicine | Admitting: Internal Medicine

## 2017-10-22 ENCOUNTER — Encounter (HOSPITAL_COMMUNITY): Payer: Self-pay | Admitting: Emergency Medicine

## 2017-10-22 ENCOUNTER — Other Ambulatory Visit: Payer: Self-pay

## 2017-10-22 ENCOUNTER — Emergency Department (HOSPITAL_COMMUNITY): Payer: Medicare HMO

## 2017-10-22 DIAGNOSIS — F0151 Vascular dementia with behavioral disturbance: Secondary | ICD-10-CM | POA: Diagnosis not present

## 2017-10-22 DIAGNOSIS — Z8719 Personal history of other diseases of the digestive system: Secondary | ICD-10-CM

## 2017-10-22 DIAGNOSIS — I10 Essential (primary) hypertension: Secondary | ICD-10-CM | POA: Diagnosis present

## 2017-10-22 DIAGNOSIS — I2510 Atherosclerotic heart disease of native coronary artery without angina pectoris: Secondary | ICD-10-CM | POA: Diagnosis not present

## 2017-10-22 DIAGNOSIS — M545 Low back pain: Secondary | ICD-10-CM | POA: Diagnosis present

## 2017-10-22 DIAGNOSIS — Y95 Nosocomial condition: Secondary | ICD-10-CM | POA: Diagnosis present

## 2017-10-22 DIAGNOSIS — R0602 Shortness of breath: Secondary | ICD-10-CM | POA: Diagnosis not present

## 2017-10-22 DIAGNOSIS — R69 Illness, unspecified: Secondary | ICD-10-CM | POA: Diagnosis not present

## 2017-10-22 DIAGNOSIS — G4700 Insomnia, unspecified: Secondary | ICD-10-CM | POA: Diagnosis present

## 2017-10-22 DIAGNOSIS — R52 Pain, unspecified: Secondary | ICD-10-CM

## 2017-10-22 DIAGNOSIS — Y9389 Activity, other specified: Secondary | ICD-10-CM | POA: Diagnosis not present

## 2017-10-22 DIAGNOSIS — R000 Tachycardia, unspecified: Secondary | ICD-10-CM | POA: Diagnosis not present

## 2017-10-22 DIAGNOSIS — W19XXXA Unspecified fall, initial encounter: Secondary | ICD-10-CM | POA: Diagnosis present

## 2017-10-22 DIAGNOSIS — R0603 Acute respiratory distress: Secondary | ICD-10-CM | POA: Diagnosis present

## 2017-10-22 DIAGNOSIS — I252 Old myocardial infarction: Secondary | ICD-10-CM

## 2017-10-22 DIAGNOSIS — R918 Other nonspecific abnormal finding of lung field: Secondary | ICD-10-CM | POA: Diagnosis not present

## 2017-10-22 DIAGNOSIS — I639 Cerebral infarction, unspecified: Secondary | ICD-10-CM | POA: Diagnosis present

## 2017-10-22 DIAGNOSIS — M25559 Pain in unspecified hip: Secondary | ICD-10-CM | POA: Diagnosis not present

## 2017-10-22 DIAGNOSIS — Z7189 Other specified counseling: Secondary | ICD-10-CM

## 2017-10-22 DIAGNOSIS — F32A Depression, unspecified: Secondary | ICD-10-CM | POA: Diagnosis present

## 2017-10-22 DIAGNOSIS — F0280 Dementia in other diseases classified elsewhere without behavioral disturbance: Secondary | ICD-10-CM | POA: Diagnosis present

## 2017-10-22 DIAGNOSIS — D649 Anemia, unspecified: Secondary | ICD-10-CM | POA: Diagnosis present

## 2017-10-22 DIAGNOSIS — S60051A Contusion of right little finger without damage to nail, initial encounter: Secondary | ICD-10-CM | POA: Diagnosis present

## 2017-10-22 DIAGNOSIS — R062 Wheezing: Secondary | ICD-10-CM | POA: Diagnosis not present

## 2017-10-22 DIAGNOSIS — F419 Anxiety disorder, unspecified: Secondary | ICD-10-CM | POA: Diagnosis present

## 2017-10-22 DIAGNOSIS — G8929 Other chronic pain: Secondary | ICD-10-CM | POA: Diagnosis present

## 2017-10-22 DIAGNOSIS — R058 Other specified cough: Secondary | ICD-10-CM

## 2017-10-22 DIAGNOSIS — I6529 Occlusion and stenosis of unspecified carotid artery: Secondary | ICD-10-CM | POA: Diagnosis present

## 2017-10-22 DIAGNOSIS — I6523 Occlusion and stenosis of bilateral carotid arteries: Secondary | ICD-10-CM | POA: Diagnosis present

## 2017-10-22 DIAGNOSIS — R05 Cough: Secondary | ICD-10-CM | POA: Diagnosis not present

## 2017-10-22 DIAGNOSIS — F05 Delirium due to known physiological condition: Secondary | ICD-10-CM | POA: Diagnosis not present

## 2017-10-22 DIAGNOSIS — R4182 Altered mental status, unspecified: Secondary | ICD-10-CM | POA: Diagnosis not present

## 2017-10-22 DIAGNOSIS — Z96651 Presence of right artificial knee joint: Secondary | ICD-10-CM | POA: Diagnosis present

## 2017-10-22 DIAGNOSIS — E876 Hypokalemia: Secondary | ICD-10-CM | POA: Diagnosis not present

## 2017-10-22 DIAGNOSIS — R0609 Other forms of dyspnea: Secondary | ICD-10-CM | POA: Diagnosis not present

## 2017-10-22 DIAGNOSIS — I4891 Unspecified atrial fibrillation: Secondary | ICD-10-CM | POA: Diagnosis not present

## 2017-10-22 DIAGNOSIS — Y92128 Other place in nursing home as the place of occurrence of the external cause: Secondary | ICD-10-CM | POA: Diagnosis not present

## 2017-10-22 DIAGNOSIS — M79605 Pain in left leg: Secondary | ICD-10-CM | POA: Diagnosis not present

## 2017-10-22 DIAGNOSIS — Z885 Allergy status to narcotic agent status: Secondary | ICD-10-CM

## 2017-10-22 DIAGNOSIS — I259 Chronic ischemic heart disease, unspecified: Secondary | ICD-10-CM | POA: Diagnosis not present

## 2017-10-22 DIAGNOSIS — Z880 Allergy status to penicillin: Secondary | ICD-10-CM

## 2017-10-22 DIAGNOSIS — G309 Alzheimer's disease, unspecified: Secondary | ICD-10-CM | POA: Diagnosis present

## 2017-10-22 DIAGNOSIS — F329 Major depressive disorder, single episode, unspecified: Secondary | ICD-10-CM | POA: Diagnosis present

## 2017-10-22 DIAGNOSIS — J181 Lobar pneumonia, unspecified organism: Principal | ICD-10-CM | POA: Diagnosis present

## 2017-10-22 DIAGNOSIS — I493 Ventricular premature depolarization: Secondary | ICD-10-CM | POA: Diagnosis present

## 2017-10-22 DIAGNOSIS — R531 Weakness: Secondary | ICD-10-CM | POA: Diagnosis not present

## 2017-10-22 DIAGNOSIS — J156 Pneumonia due to other aerobic Gram-negative bacteria: Secondary | ICD-10-CM | POA: Diagnosis not present

## 2017-10-22 DIAGNOSIS — S0990XA Unspecified injury of head, initial encounter: Secondary | ICD-10-CM | POA: Diagnosis present

## 2017-10-22 DIAGNOSIS — R402413 Glasgow coma scale score 13-15, at hospital admission: Secondary | ICD-10-CM | POA: Diagnosis present

## 2017-10-22 DIAGNOSIS — M069 Rheumatoid arthritis, unspecified: Secondary | ICD-10-CM | POA: Diagnosis present

## 2017-10-22 DIAGNOSIS — F0390 Unspecified dementia without behavioral disturbance: Secondary | ICD-10-CM | POA: Diagnosis not present

## 2017-10-22 DIAGNOSIS — I255 Ischemic cardiomyopathy: Secondary | ICD-10-CM | POA: Diagnosis present

## 2017-10-22 DIAGNOSIS — Z881 Allergy status to other antibiotic agents status: Secondary | ICD-10-CM

## 2017-10-22 DIAGNOSIS — Z9861 Coronary angioplasty status: Secondary | ICD-10-CM

## 2017-10-22 DIAGNOSIS — Z66 Do not resuscitate: Secondary | ICD-10-CM | POA: Diagnosis present

## 2017-10-22 DIAGNOSIS — Z981 Arthrodesis status: Secondary | ICD-10-CM

## 2017-10-22 DIAGNOSIS — Z7982 Long term (current) use of aspirin: Secondary | ICD-10-CM

## 2017-10-22 DIAGNOSIS — Z79899 Other long term (current) drug therapy: Secondary | ICD-10-CM

## 2017-10-22 DIAGNOSIS — Y92129 Unspecified place in nursing home as the place of occurrence of the external cause: Secondary | ICD-10-CM | POA: Diagnosis not present

## 2017-10-22 DIAGNOSIS — Z888 Allergy status to other drugs, medicaments and biological substances status: Secondary | ICD-10-CM

## 2017-10-22 DIAGNOSIS — K219 Gastro-esophageal reflux disease without esophagitis: Secondary | ICD-10-CM | POA: Diagnosis present

## 2017-10-22 DIAGNOSIS — J189 Pneumonia, unspecified organism: Secondary | ICD-10-CM | POA: Diagnosis not present

## 2017-10-22 DIAGNOSIS — D509 Iron deficiency anemia, unspecified: Secondary | ICD-10-CM | POA: Diagnosis present

## 2017-10-22 DIAGNOSIS — E785 Hyperlipidemia, unspecified: Secondary | ICD-10-CM | POA: Diagnosis not present

## 2017-10-22 DIAGNOSIS — Z8673 Personal history of transient ischemic attack (TIA), and cerebral infarction without residual deficits: Secondary | ICD-10-CM

## 2017-10-22 DIAGNOSIS — T148XXA Other injury of unspecified body region, initial encounter: Secondary | ICD-10-CM | POA: Diagnosis not present

## 2017-10-22 DIAGNOSIS — Z515 Encounter for palliative care: Secondary | ICD-10-CM | POA: Diagnosis not present

## 2017-10-22 DIAGNOSIS — Z882 Allergy status to sulfonamides status: Secondary | ICD-10-CM

## 2017-10-22 DIAGNOSIS — Y999 Unspecified external cause status: Secondary | ICD-10-CM | POA: Diagnosis not present

## 2017-10-22 DIAGNOSIS — S199XXA Unspecified injury of neck, initial encounter: Secondary | ICD-10-CM | POA: Diagnosis not present

## 2017-10-22 HISTORY — DX: Unspecified atrial fibrillation: I48.91

## 2017-10-22 LAB — URINALYSIS, ROUTINE W REFLEX MICROSCOPIC
BILIRUBIN URINE: NEGATIVE
Bacteria, UA: NONE SEEN
Glucose, UA: NEGATIVE mg/dL
Ketones, ur: 5 mg/dL — AB
Leukocytes, UA: NEGATIVE
Nitrite: NEGATIVE
PH: 5 (ref 5.0–8.0)
PROTEIN: 30 mg/dL — AB
Specific Gravity, Urine: 1.017 (ref 1.005–1.030)
Squamous Epithelial / LPF: NONE SEEN

## 2017-10-22 LAB — STREP PNEUMONIAE URINARY ANTIGEN: Strep Pneumo Urinary Antigen: NEGATIVE

## 2017-10-22 LAB — CBC WITH DIFFERENTIAL/PLATELET
BASOS ABS: 0 10*3/uL (ref 0.0–0.1)
BASOS PCT: 0 %
EOS PCT: 0 %
Eosinophils Absolute: 0 10*3/uL (ref 0.0–0.7)
HEMATOCRIT: 38 % (ref 36.0–46.0)
Hemoglobin: 11.8 g/dL — ABNORMAL LOW (ref 12.0–15.0)
Lymphocytes Relative: 6 %
Lymphs Abs: 0.8 10*3/uL (ref 0.7–4.0)
MCH: 27.8 pg (ref 26.0–34.0)
MCHC: 31.1 g/dL (ref 30.0–36.0)
MCV: 89.6 fL (ref 78.0–100.0)
MONO ABS: 1.6 10*3/uL — AB (ref 0.1–1.0)
MONOS PCT: 13 %
NEUTROS ABS: 9.4 10*3/uL — AB (ref 1.7–7.7)
Neutrophils Relative %: 81 %
PLATELETS: 173 10*3/uL (ref 150–400)
RBC: 4.24 MIL/uL (ref 3.87–5.11)
RDW: 15.8 % — AB (ref 11.5–15.5)
WBC: 11.8 10*3/uL — ABNORMAL HIGH (ref 4.0–10.5)

## 2017-10-22 LAB — BASIC METABOLIC PANEL
ANION GAP: 12 (ref 5–15)
BUN: 12 mg/dL (ref 6–20)
CALCIUM: 8.5 mg/dL — AB (ref 8.9–10.3)
CO2: 23 mmol/L (ref 22–32)
CREATININE: 0.89 mg/dL (ref 0.44–1.00)
Chloride: 103 mmol/L (ref 101–111)
GFR calc non Af Amer: 57 mL/min — ABNORMAL LOW (ref >60)
Glucose, Bld: 136 mg/dL — ABNORMAL HIGH (ref 65–99)
Potassium: 3.8 mmol/L (ref 3.5–5.1)
SODIUM: 138 mmol/L (ref 135–145)

## 2017-10-22 LAB — INFLUENZA PANEL BY PCR (TYPE A & B)
Influenza A By PCR: NEGATIVE
Influenza B By PCR: NEGATIVE

## 2017-10-22 LAB — I-STAT CG4 LACTIC ACID, ED
LACTIC ACID, VENOUS: 1.21 mmol/L (ref 0.5–1.9)
Lactic Acid, Venous: 0.98 mmol/L (ref 0.5–1.9)

## 2017-10-22 LAB — PROCALCITONIN: Procalcitonin: <0.1 ng/mL

## 2017-10-22 LAB — I-STAT TROPONIN, ED: TROPONIN I, POC: 0.01 ng/mL (ref 0.00–0.08)

## 2017-10-22 LAB — MRSA PCR SCREENING: MRSA by PCR: NEGATIVE

## 2017-10-22 MED ORDER — GLYCOPYRROLATE 0.2 MG/ML IJ SOLN: 0.2000 mg | INTRAMUSCULAR | Status: DC | PRN

## 2017-10-22 MED ORDER — PANTOPRAZOLE SODIUM 40 MG PO TBEC
40.0000 mg | DELAYED_RELEASE_TABLET | Freq: Every day | ORAL | Status: DC
  Administered 2017-10-22 – 2017-10-30 (×9): 40 mg via ORAL
  Filled 2017-10-22 (×10): qty 1

## 2017-10-22 MED ORDER — LEVOFLOXACIN 500 MG PO TABS: 500.0000 mg | ORAL_TABLET | Freq: Every day | ORAL | 0 refills | Status: DC

## 2017-10-22 MED ORDER — SODIUM CHLORIDE 0.9 % IV SOLN
1.0000 g | Freq: Two times a day (BID) | INTRAVENOUS | Status: DC
  Administered 2017-10-22 – 2017-10-25 (×7): 1 g via INTRAVENOUS
  Filled 2017-10-22 (×8): qty 1

## 2017-10-22 MED ORDER — SODIUM CHLORIDE 0.9 % IV BOLUS (SEPSIS)
500.0000 mL | Freq: Once | INTRAVENOUS | Status: AC
  Administered 2017-10-22: 500 mL via INTRAVENOUS

## 2017-10-22 MED ORDER — LEVALBUTEROL HCL 0.63 MG/3ML IN NEBU
0.6300 mg | INHALATION_SOLUTION | Freq: Four times a day (QID) | RESPIRATORY_TRACT | Status: DC | PRN
  Administered 2017-10-22: 0.63 mg via RESPIRATORY_TRACT
  Filled 2017-10-22: qty 3

## 2017-10-22 MED ORDER — IPRATROPIUM-ALBUTEROL 0.5-2.5 (3) MG/3ML IN SOLN: 3.0000 mL | RESPIRATORY_TRACT | Status: DC | PRN

## 2017-10-22 MED ORDER — ALBUTEROL SULFATE (2.5 MG/3ML) 0.083% IN NEBU: 5.0000 mg | INHALATION_SOLUTION | RESPIRATORY_TRACT | Status: DC | PRN

## 2017-10-22 MED ORDER — PRAVASTATIN SODIUM 40 MG PO TABS
40.0000 mg | ORAL_TABLET | Freq: Every day | ORAL | Status: DC
  Administered 2017-10-22 – 2017-10-29 (×8): 40 mg via ORAL
  Filled 2017-10-22 (×8): qty 1

## 2017-10-22 MED ORDER — POLYVINYL ALCOHOL 1.4 % OP SOLN
1.0000 [drp] | Freq: Four times a day (QID) | OPHTHALMIC | Status: DC | PRN
  Filled 2017-10-22: qty 15

## 2017-10-22 MED ORDER — GUAIFENESIN ER 600 MG PO TB12
600.0000 mg | ORAL_TABLET | Freq: Two times a day (BID) | ORAL | Status: DC | PRN
  Administered 2017-10-22 – 2017-10-24 (×2): 600 mg via ORAL
  Filled 2017-10-22 (×2): qty 1

## 2017-10-22 MED ORDER — ALBUTEROL SULFATE (2.5 MG/3ML) 0.083% IN NEBU
5.0000 mg | INHALATION_SOLUTION | Freq: Once | RESPIRATORY_TRACT | Status: AC
  Administered 2017-10-22: 5 mg via RESPIRATORY_TRACT
  Filled 2017-10-22: qty 6

## 2017-10-22 MED ORDER — QUETIAPINE FUMARATE 25 MG PO TABS
25.0000 mg | ORAL_TABLET | Freq: Two times a day (BID) | ORAL | Status: DC
  Administered 2017-10-22 – 2017-10-30 (×17): 25 mg via ORAL
  Filled 2017-10-22 (×18): qty 1

## 2017-10-22 MED ORDER — BIOTENE DRY MOUTH MT LIQD: 15.0000 mL | OROMUCOSAL | Status: DC | PRN

## 2017-10-22 MED ORDER — SODIUM CHLORIDE 0.9 % IV SOLN
INTRAVENOUS | Status: DC
  Administered 2017-10-22: 13:00:00 via INTRAVENOUS

## 2017-10-22 MED ORDER — LEVOFLOXACIN IN D5W 500 MG/100ML IV SOLN
500.0000 mg | Freq: Once | INTRAVENOUS | Status: AC
  Administered 2017-10-22: 500 mg via INTRAVENOUS
  Filled 2017-10-22: qty 100

## 2017-10-22 MED ORDER — ASPIRIN 81 MG PO CHEW
81.0000 mg | CHEWABLE_TABLET | Freq: Every day | ORAL | Status: DC
  Administered 2017-10-22 – 2017-10-30 (×9): 81 mg via ORAL
  Filled 2017-10-22 (×9): qty 1

## 2017-10-22 MED ORDER — ENOXAPARIN SODIUM 40 MG/0.4ML ~~LOC~~ SOLN
40.0000 mg | SUBCUTANEOUS | Status: DC
  Administered 2017-10-23 – 2017-10-30 (×8): 40 mg via SUBCUTANEOUS
  Filled 2017-10-22 (×8): qty 0.4

## 2017-10-22 MED ORDER — HYDRALAZINE HCL 20 MG/ML IJ SOLN
5.0000 mg | Freq: Three times a day (TID) | INTRAMUSCULAR | Status: DC | PRN
  Administered 2017-10-29: 5 mg via INTRAVENOUS
  Filled 2017-10-22: qty 1

## 2017-10-22 MED ORDER — GLYCOPYRROLATE 1 MG PO TABS: 1.0000 mg | ORAL_TABLET | ORAL | Status: DC | PRN

## 2017-10-22 NOTE — H&P (Addendum)
History and Physical    Connie Wagner ZOX:[id number] DOB: 1928-12-25 DOA: 10/22/2017   PCP: Merlene Laughter, MD   Patient coming from:  Home    Chief Complaint: Shortness of breath and cough  HPI: Connie Wagner is a 84 y.o. female with medical history significant for Alzheimer's dementia, nursing home resident, history of CAD status post MI in 2014, ischemic cardiomyopathy, chronic low back pain, rheumatoid arthritis, GERD, depression, anxiety, anemia, hypertension, hyperlipidemia, recently started on Tamiflu on 10/13/2017 at the facility (per daughter's report, there were cases of flu at the nursing home, for which everybody was placed preventatively with the same med).  Since then, the patient has been experiencing several day history of progressive shortness of breath, with wheezing.  Per daughter's report, the patient had fallen at the facility, for which she was seen at the infirmary, where they noticed the patient having some respiratory distress, despite good O2 sats.  She had a T-max of 99.4.  She was sent to the ED for further evaluation and management of her symptoms.  Other information cannot be obtained, as the patient is level 5 caveat due to dementia.  ED Course:  BP 103/89 (BP Location: Right Arm)   Pulse (!) 134   Temp 100.1 F (37.8 C) (Rectal)   Resp (!) 21   SpO2 96%   Lactic acid 1.21, then 0.98 Influenza by PCR was negative WBC 11.8, hemoglobin 11.8, calcium 8.5, glucose 136. Chest x-ray Mild patchy right lower parahilar lung opacity, which could represent atelectasis, aspiration or pneumonia. Creatinine 0.89 Sinus tachycardia with PVCs, no significant changes from prior Last 2D echo in November 2018 shows grade 1 diastolic dysfunction, EF 60-65%, normal systolic function in the left ventricle Received Levaquin dose, as well as albuterol x1, with some improvement of her symptoms.  Review of Systems: Level 5 caveat due to dementia  Past Medical History:    Diagnosis Date  . Anemia    iron deficient  . Anxiety   . CAD (coronary artery disease)    a. s/p Inf STEMI 2/14 => LHC 10/18/12: Diffuse distal LM 30%, LAD 50% calcified stenosis just after first septal perforator, diagonal branches with mild 20-30% stenosis, mid RCA 99%. PCI: Promus Premier DES to the mid RCA  . Carotid stenosis    a. [employer] screening done in 2009 with carotid stenosis noted;  b.  Carotid dopplers 3/14: R 0-39%, L 40-59%, repeat 1 year  . Chronic lower back pain   . 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 Dementia    needs redirection freq.  . Depression   . Hypertension   . Ischemic cardiomyopathy    a. Echocardiogram 10/18/12: Mild LVH, inferior and inferoseptal AK, posterior HK, EF 45%, grade 1 diastolic dysfunction, aortic sclerosis without stenosis, mild MR, mild LAE, RV systolic function moderately reduced (pacer wire in right ventricle), PASP 32  . Migraines    "haven't had one since going thru menopause"  . Rheumatoid arthritis(714.0)   . Upper GI bleeding 09/14/11   "have had it once before today"    Past Surgical History:  Procedure Laterality Date  . BACK SURGERY    . BREAST LUMPECTOMY     "benign tumor removed"  . ESOPHAGOGASTRODUODENOSCOPY  09/15/2011   Procedure: ESOPHAGOGASTRODUODENOSCOPY (EGD);  Surgeon: Barrie Folk, MD;  Location: [facility] ENDOSCOPY;  Service: Endoscopy;  Laterality: N/A;  . HARVEST BONE GRAFT  08/17/2012   Procedure: HARVEST  ILIAC BONE GRAFT;  Surgeon: Eulas Post, MD;  Location: [facility] OR;  Service: Orthopedics;  Laterality: Right;  . HEMATOMA EVACUATION  10/2002   lumbar  . INCISION AND DRAINAGE OF WOUND  10/2002   lumbar wound infection  . JOINT REPLACEMENT    . KNEE ARTHROSCOPY  01/2001   right  . LEFT HEART CATHETERIZATION WITH CORONARY ANGIOGRAM N/A 10/18/2012   Procedure: LEFT HEART CATHETERIZATION WITH CORONARY ANGIOGRAM;  Surgeon: Tonny Bollman, MD;  Location: [facility] CATH LAB;  Service: Cardiovascular;  Laterality: N/A;  . LUMBAR DISC SURGERY  09/2002   Hemilaminectomy, L3, complete laminectomy, L4, hemi- semilaminectomy, L5 and right S1, and diskectomy, L5-S1.  Marland Kitchen LUMBAR LAMINECTOMY  10/2003   for epidural abscess evacuation; L4-5  . ORIF HUMERUS FRACTURE  08/17/2012   Procedure: OPEN REDUCTION INTERNAL FIXATION (ORIF) DISTAL HUMERUS FRACTURE;  Surgeon: Eulas Post, MD;  Location: [facility] OR;  Service: Orthopedics;  Laterality: Right;  ORIF RIGHT SUPRACHONDULAR HUMERUS FRACTURE  . TOTAL KNEE ARTHROPLASTY  01/2002   right    Social History Social History   Socioeconomic History  . Marital status: Widowed    Spouse name: Not on file  . Number of children: Not on file  . Years of education: Not on file  . Highest education level: Not on file  Social Needs  . Financial resource strain: Not on file  . Food insecurity - worry: Not on file  . Food insecurity - inability: Not on file  . Transportation needs - medical: Not on file  . Transportation needs - non-medical: Not on file  Occupational History  . Occupation: Retired Engineer, civil (consulting)    Comment: 40 years  Tobacco Use  . Smoking status: Never Smoker  . Smokeless tobacco: Never Used  Substance and Sexual Activity  . Alcohol use: Yes    Alcohol/week: 3.0 oz    Types: 5 Glasses of wine per week  . Drug use: No  . Sexual activity: No  Other Topics Concern  . Not on file  Social History Narrative   Daughter and Emergency Contact: Desmond Dike   Cell [phone]   Home [phone]     Allergies  Allergen Reactions  . Augmentin [Amoxicillin-Pot Clavulanate] Other (See Comments)    On MAR   . Codeine Other (See Comments)    On MAR  . Cozaar [Losartan Potassium] Other (See Comments)    On MAR  . Evista [Raloxifene Hcl] Other (See Comments)    On MAR  . Flagyl [Metronidazole] Other (See Comments)    On MAR  . Iron Nausea And Vomiting    Loose stool  . Lexapro  [Escitalopram Oxalate] Other (See Comments)    On MAR  . Tetracyclines & Related Other (See Comments)    On MAR  . Penicillins Rash    Has patient had a PCN reaction causing immediate rash, facial/tongue/throat swelling, SOB or lightheadedness with hypotension: No  Has patient had a PCN reaction causing severe rash involving mucus membranes or skin necrosis: No Has patient had a PCN reaction that required hospitalization No Has patient had a PCN reaction occurring within the last 10 years: No If all of the above answers are "NO", then may proceed with Cephalosporin use.   Gaetana Michaelis Drugs Cross Reactors Rash    Family History  Problem Relation Age of Onset  . Heart attack Mother   . Stroke Mother   . Hypertension Sister   . Anesthesia problems Neg Hx   .  Hypotension Neg Hx   . Malignant hyperthermia Neg Hx   . Pseudochol deficiency Neg Hx       Prior to Admission medications   Medication Sig Start Date End Date Taking? Authorizing Provider  acetaminophen (TYLENOL) 325 MG tablet Take 2 tablets (650 mg total) every 6 (six) hours as needed by mouth for mild pain or fever. 07/06/17   Beryl Meager, NP  aspirin EC 81 MG EC tablet Take 1 tablet (81 mg total) daily by mouth. 07/06/17   Beryl Meager, NP  levofloxacin (LEVAQUIN) 500 MG tablet Take 1 tablet (500 mg total) by mouth daily. 10/23/17   Cathren Laine, MD  pantoprazole (PROTONIX) 40 MG tablet Take 1 tablet (40 mg total) daily by mouth. 07/06/17   Beryl Meager, NP  pravastatin (PRAVACHOL) 40 MG tablet Take 1 tablet (40 mg total) daily at 6 PM by mouth. 07/06/17   Beryl Meager, NP  QUEtiapine (SEROQUEL) 25 MG tablet Take 1 tablet (25 mg total) 2 (two) times daily by mouth. 07/06/17   Beryl Meager, NP    Physical Exam:  Vitals:   10/22/17 0830 10/22/17 0851 10/22/17 1005 10/22/17 1055  BP: 108/90  (!) 116/96 103/89  Pulse:  (!) 105 (!) 105 (!) 134  Resp:  19 (!) 21 (!) 21  Temp:      TempSrc:      SpO2:  100% 94%  96%   Constitutional: NAD, calm, comfortable.  Eyes: PERRL, lids and conjunctivae normal ENMT: Mucous membranes are moist, without exudate or lesions  Neck: normal, supple, no masses, no thyromegaly Respiratory: Diffuse wheezing and rhonchi (R>L)  no crackles. Normal respiratory effort  Cardiovascular: Tachy rate and rhythm, soft 1 out of 6 murmur, rubs or gallops. No extremity edema. 2+ pedal pulses. No carotid bruits.  Abdomen: Soft, non tender, No hepatosplenomegaly. Bowel sounds positive.  Musculoskeletal: no clubbing / cyanosis. Moves all extremities.  No apparent tenderness in the right little finger Skin: no jaundice, No lesions.  Some bruising on the right last finger after falling Neurologic: Sensation intact  Strength equal in all extremities, but does not follow commands properly, due to dementia Psychiatric:   Alert and oriented to self, in the setting of dementia.. Normal mood.     Labs on Admission: I have personally reviewed following labs and imaging studies  CBC: Recent Labs  Lab 10/22/17 0700  WBC 11.8*  NEUTROABS 9.4*  HGB 11.8*  HCT 38.0  MCV 89.6  PLT 173    Basic Metabolic Panel: Recent Labs  Lab 10/22/17 0700  NA 138  K 3.8  CL 103  CO2 23  GLUCOSE 136*  BUN 12  CREATININE 0.89  CALCIUM 8.5*    GFR: CrCl cannot be calculated (Unknown ideal weight.).  Liver Function Tests: No results for input(s): AST, ALT, ALKPHOS, BILITOT, PROT, ALBUMIN in the last 168 hours. No results for input(s): LIPASE, AMYLASE in the last 168 hours. No results for input(s): AMMONIA in the last 168 hours.  Coagulation Profile: No results for input(s): INR, PROTIME in the last 168 hours.  Cardiac Enzymes: No results for input(s): CKTOTAL, CKMB, CKMBINDEX, TROPONINI in the last 168 hours.  BNP (last 3 results) No results for input(s): PROBNP in the last 8760 hours.  HbA1C: No results for input(s): HGBA1C in the last 72 hours.  CBG: No results for input(s):  GLUCAP in the last 168 hours.  Lipid Profile: No results for input(s): CHOL, HDL, LDLCALC, TRIG, CHOLHDL, LDLDIRECT  in the last 72 hours.  Thyroid Function Tests: No results for input(s): TSH, T4TOTAL, FREET4, T3FREE, THYROIDAB in the last 72 hours.  Anemia Panel: No results for input(s): VITAMINB12, FOLATE, FERRITIN, TIBC, IRON, RETICCTPCT in the last 72 hours.  Urine analysis:    Component Value Date/Time   COLORURINE YELLOW 10/22/2017 0648   APPEARANCEUR CLEAR 10/22/2017 0648   LABSPEC 1.017 10/22/2017 0648   PHURINE 5.0 10/22/2017 0648   GLUCOSEU NEGATIVE 10/22/2017 0648   HGBUR LARGE (A) 10/22/2017 0648   BILIRUBINUR NEGATIVE 10/22/2017 0648   KETONESUR 5 (A) 10/22/2017 0648   PROTEINUR 30 (A) 10/22/2017 0648   UROBILINOGEN 0.2 06/08/2015 1700   NITRITE NEGATIVE 10/22/2017 0648   LEUKOCYTESUR NEGATIVE 10/22/2017 0648    Sepsis Labs: @LABRCNTIP (procalcitonin:4,lacticidven:4) )No results found for this or any previous visit (from the past 240 hour(s)).   Radiological Exams on Admission: Dg Chest 2 View  Result Date: 10/22/2017 CLINICAL DATA:  Dyspnea and fever EXAM: CHEST  2 VIEW COMPARISON:  11/17/2014 chest radiograph. FINDINGS: Stable cardiomediastinal silhouette with mild cardiomegaly. No pneumothorax. No pleural effusion. Mild patchy right lower parahilar lung opacity. Multiple mid to lower thoracic and upper lumbar vertebral compression deformities appear chronic and stable. IMPRESSION: Mild patchy right lower parahilar lung opacity, which could represent atelectasis, aspiration or pneumonia. Recommend short-term follow-up PA and lateral chest radiographs. Mild cardiomegaly. Electronically Signed   By: 11/19/2014 M.D.   On: 10/22/2017 07:24    EKG: Independently reviewed.  Assessment/Plan Principal Problem:   Pneumonia Active Problems:   Shortness of breath   Tachycardia   HTN (hypertension)   Anxiety   Dementia   Depression   Normocytic anemia   Coronary  artery disease   Stroke (cerebrum) (HCC)   Carotid stenosis, bilateral    Progressive shortness of breath and cough in the setting of Pneumonia  CURB 65 is 1  Tmax 100  CXR today suggest Mild patchy right lower parahilar lung opacity suspicious for pneumonia  WBC 11.8  Lactic acid 0.98  Bicarb 23. Influenza by PCR was negative. Tachycardic. Received Levaquin dose, as well as albuterol x1, with some improvement of her symptoms. Receiving a bolus of NS 500 for tachycardia  Admit to Tele Obs  Oxygen   sputum cultures  IV antibiotics with Cefepime and Vanco  After discussion with Pharmacy  Procalcitonin,Strep pneumo urine antigen  Nebulizers as needed, with Xopenex q 6 prn (due to rate of 134 at this time). May add Duoneb once pulse improves  Mucinex prn  IV fluids at 75 cc/h  Repeat CBC in am   Hypertension BP 103/89 Pulse  Controlled. Not on anti-hypertensive medications  Add Hydralazine Q6 hours as needed for BP >180 syst  Hyperlipidemia Continue home statins  History of ICM/CAD, last 2D echo  in November 2018 shows grade 1 diastolic dysfunction, EF 60-65%, normal systolic function in the left ventricle. Sinus tachycardia with PVCs, no significant changes from prior No active issues Will monitor, can follow up as outpatient   Mechanical fall with bruising of the R little finger. Offered XR to r/o fracture, patient and daughter declined   History of Dementia, not on meds ./ Insomnia  No acute issues Continue Seroquel for sleep   Anemia  Hemoglobin on admission 11.8  At baseline Repeat CBC in am  No transfusion is indicated at this time Continue Iron supplements  GERD, no acute symptoms Continue PPI      DVT prophylaxis: Lovenox Code Status: DNR Family Communication:  Discussed with patient's daughter Disposition Plan: Expect patient to be discharged to home after condition improves Consults called:    None Admission status: Limited observation   Marlowe Kays,  PA-C [facility]   Amion text  [phone]   10/22/2017, 12:32 PM

## 2017-10-22 NOTE — ED Notes (Signed)
Pt found walking around in the hallway. Pt had pulled off her IV. Pt placed back in bed and bed alarm placed on patient.

## 2017-10-22 NOTE — Progress Notes (Addendum)
Patient's overall status is declined over the last few hours.  She became more tachycardic, and at times, she was less responsive, combined with periods of agitation.  Dr. Willette Pa has had a lengthy discussion with  her daughter, Connie Wagner for issues regarding medical management while in the hospital, aggressiveness of care, such as antibiotics, medicines, etc.  Daughter would like the patient to lean towards comfort issues, but as she would be unavailable to be present for palliative care evaluation until tomorrow, she wishes that we continue the same medications for now, until palliative care comes, however no aggressive measures, or specialty consultations will take place.

## 2017-10-22 NOTE — ED Provider Notes (Signed)
MSE was initiated and I personally evaluated the patient and placed orders (if any) at  6:45 AM on October 22, 2017.  The patient appears stable so that the remainder of the MSE may be completed by another provider.   Patient is a 84 year old female with history of dementia coming from a nursing home with shortness of breath and wheezing.  Recently started on Tamiflu but it is unclear if she had any confirmatory testing.  In no respiratory distress now.  Sats 97% on room air.  Received albuterol treatment with EMS.  Oxygen saturation 90% at nursing facility.  Oral temp of 99.4.  Will start workup.   Roemello Speyer, Layla Maw, DO 10/22/17 [phone]

## 2017-10-22 NOTE — ED Notes (Signed)
MD Steinl at the bedside  

## 2017-10-22 NOTE — ED Notes (Signed)
Nurse drawing labs. 

## 2017-10-22 NOTE — ED Notes (Signed)
Admitting MD at the bedside. Made aware of patient's condition and noted to be declining.

## 2017-10-22 NOTE — ED Notes (Signed)
Pt in X ray

## 2017-10-22 NOTE — ED Triage Notes (Signed)
Per EMS pt from facility. COmplaints of shob x "weeks" per staff but stated "daughter didn't want her sent out"  Daughter did not answer this morning and staff felt pt needed to be seen. Pt has had congestion and wheeze that appears to have worsened recently.  On tamiflu but per paperwork and staff no test has been done.  Dementia history.  1 albuterol neb given en route.

## 2017-10-22 NOTE — ED Provider Notes (Signed)
[facility] EMERGENCY DEPARTMENT Provider Note   CSN: [id number] Arrival date & time: 10/22/17  2229     History   Chief Complaint Chief Complaint  Patient presents with  . Shortness of Breath    HPI Connie Wagner is a 84 y.o. female.  Patient c/o cough/congestion for the past week. Staff at [facility] felt was getting worse so sent to ED for evaluation. Symptoms moderate, persistent.  Patient denies chest pain, states she feels she is breathing fine. Mild nasal congestion. No sore throat. Denies body aches. ?recent fevers.  Recently started on tamiflu for possible flu. Patient denies nausea or vomiting. No headache. No neck pain or stiffness.    The history is provided by the patient.  Shortness of Breath  Associated symptoms include a fever and cough. Pertinent negatives include no headaches, no neck pain, no chest pain, no vomiting, no abdominal pain and no rash.    Past Medical History:  Diagnosis Date  . Anemia    iron deficient  . Anxiety   . CAD (coronary artery disease)    a. s/p Inf STEMI 2/14 => LHC 10/18/12: Diffuse distal LM 30%, LAD 50% calcified stenosis just after first septal perforator, diagonal branches with mild 20-30% stenosis, mid RCA 99%. PCI: Promus Premier DES to the mid RCA  . Carotid stenosis    a. LifeLine screening done in 2009 with carotid stenosis noted;  b.  Carotid dopplers 3/14: R 0-39%, L 40-59%, repeat 1 year  . Chronic lower back pain   . 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 Dementia    needs redirection freq.  . Depression   . Hypertension   . Ischemic cardiomyopathy    a. Echocardiogram 10/18/12: Mild LVH, inferior and inferoseptal AK, posterior HK, EF 45%, grade 1 diastolic dysfunction, aortic sclerosis without stenosis, mild MR, mild LAE, RV systolic function moderately reduced (pacer wire in right ventricle), PASP 32  . Migraines    "haven't had one since going thru menopause"  . Rheumatoid arthritis(714.0)   . Upper GI bleeding 09/14/11   "have had it once before today"    Patient Active Problem List   Diagnosis Date Noted  . Carotid stenosis, bilateral 07/05/2017  . Cerebral embolism with cerebral infarction 07/04/2017  . Stroke (cerebrum) (HCC) 07/04/2017  . Physical deconditioning 10/23/2012  . Cardiogenic shock (HCC) 10/23/2012  . Wenckebach second degree AV block 10/20/2012  . Complete heart block (HCC) 10/19/2012  . Acute renal failure (HCC) 10/19/2012  . Acute lower UTI 10/19/2012  . Acute systolic CHF (congestive heart failure) (HCC) 10/19/2012  . Normocytic anemia 10/19/2012  . S/P drug eluting coronary stent placement 10/19/2012  . Coronary artery disease 10/19/2012  . Acute MI, inferior wall, initial episode of care (HCC) 10/18/2012  . Urinary tract infection, E. coli 08/21/2012  . Hypertension   . Dementia   . Depression   . Prepyloric ulcer 09/15/2011  . Hypotension due to blood loss 09/15/2011  . Leukocytosis 09/15/2011  . Extrarenal azotemia 09/15/2011  . GI bleeding 09/14/2011  . Anemia associated with acute blood loss 09/14/2011  . Tachycardia 09/14/2011  . HTN (hypertension) 09/14/2011  . Anxiety 09/14/2011  . ETOH abuse 09/14/2011  . 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 09/14/2011    Past Surgical History:  Procedure Laterality Date  . BACK SURGERY    . BREAST LUMPECTOMY     "  benign tumor removed"  . ESOPHAGOGASTRODUODENOSCOPY  09/15/2011   Procedure: ESOPHAGOGASTRODUODENOSCOPY (EGD);  Surgeon: Barrie Folk, MD;  Location: [facility] ENDOSCOPY;  Service: Endoscopy;  Laterality: N/A;  . HARVEST BONE GRAFT  08/17/2012   Procedure: HARVEST ILIAC BONE GRAFT;  Surgeon: Eulas Post, MD;  Location: [facility] OR;  Service: Orthopedics;  Laterality: Right;  . HEMATOMA EVACUATION  10/2002   lumbar  . INCISION AND DRAINAGE OF  WOUND  10/2002   lumbar wound infection  . JOINT REPLACEMENT    . KNEE ARTHROSCOPY  01/2001   right  . LEFT HEART CATHETERIZATION WITH CORONARY ANGIOGRAM N/A 10/18/2012   Procedure: LEFT HEART CATHETERIZATION WITH CORONARY ANGIOGRAM;  Surgeon: Tonny Bollman, MD;  Location: [facility] CATH LAB;  Service: Cardiovascular;  Laterality: N/A;  . LUMBAR DISC SURGERY  09/2002   Hemilaminectomy, L3, complete laminectomy, L4, hemi- semilaminectomy, L5 and right S1, and diskectomy, L5-S1.  Marland Kitchen LUMBAR LAMINECTOMY  10/2003   for epidural abscess evacuation; L4-5  . ORIF HUMERUS FRACTURE  08/17/2012   Procedure: OPEN REDUCTION INTERNAL FIXATION (ORIF) DISTAL HUMERUS FRACTURE;  Surgeon: Eulas Post, MD;  Location: [facility] OR;  Service: Orthopedics;  Laterality: Right;  ORIF RIGHT SUPRACHONDULAR HUMERUS FRACTURE  . TOTAL KNEE ARTHROPLASTY  01/2002   right    OB History    Gravida Para Term Preterm AB Living   0 0 0 0 0 0   SAB TAB Ectopic Multiple Live Births   0 0 0 0         Home Medications    Prior to Admission medications   Medication Sig Start Date End Date Taking? Authorizing Provider  acetaminophen (TYLENOL) 325 MG tablet Take 2 tablets (650 mg total) every 6 (six) hours as needed by mouth for mild pain or fever. 07/06/17   Beryl Meager, NP  aspirin EC 81 MG EC tablet Take 1 tablet (81 mg total) daily by mouth. 07/06/17   Beryl Meager, NP  pantoprazole (PROTONIX) 40 MG tablet Take 1 tablet (40 mg total) daily by mouth. 07/06/17   Beryl Meager, NP  pravastatin (PRAVACHOL) 40 MG tablet Take 1 tablet (40 mg total) daily at 6 PM by mouth. 07/06/17   Beryl Meager, NP  QUEtiapine (SEROQUEL) 25 MG tablet Take 1 tablet (25 mg total) 2 (two) times daily by mouth. 07/06/17   Beryl Meager, NP    Family History Family History  Problem Relation Age of Onset  . Heart attack Mother   . Stroke Mother   . Hypertension Sister   . Anesthesia problems Neg Hx   . Hypotension Neg Hx   . Malignant  hyperthermia Neg Hx   . Pseudochol deficiency Neg Hx     Social History Social History   Tobacco Use  . Smoking status: Never Smoker  . Smokeless tobacco: Never Used  Substance Use Topics  . Alcohol use: Yes    Alcohol/week: 3.0 oz    Types: 5 Glasses of wine per week  . Drug use: No     Allergies   Augmentin [amoxicillin-pot clavulanate]; Codeine; Cozaar [losartan potassium]; Evista [raloxifene hcl]; Flagyl [metronidazole]; Iron; Lexapro [escitalopram oxalate]; Tetracyclines & related; Penicillins; and Sulfa drugs cross reactors   Review of Systems Review of Systems  Constitutional: Positive for fever.  HENT: Positive for congestion.   Eyes: Negative for redness.  Respiratory: Positive for cough and shortness of breath.   Cardiovascular: Negative for chest pain.  Gastrointestinal: Negative for abdominal pain, diarrhea and  vomiting.  Genitourinary: Negative for dysuria and flank pain.  Musculoskeletal: Negative for neck pain and neck stiffness.  Skin: Negative for rash.  Neurological: Negative for headaches.  Hematological: Does not bruise/bleed easily.  Psychiatric/Behavioral: The patient is not nervous/anxious.      Physical Exam Updated Vital Signs BP 139/84 (BP Location: Right Arm)   Pulse (!) 121   Resp (!) 29   SpO2 94%   Physical Exam  Constitutional: She appears well-developed and well-nourished. No distress.  HENT:  Mouth/Throat: Oropharynx is clear and moist.  Eyes: Conjunctivae are normal. No scleral icterus.  Neck: Neck supple. No tracheal deviation present.  No stiffness or rigidity  Cardiovascular: Normal heart sounds and intact distal pulses.  No murmur heard. Tachycardic.   Pulmonary/Chest: Effort normal. No respiratory distress. She has rales.  Abdominal: Soft. Normal appearance and bowel sounds are normal. She exhibits no distension. There is no tenderness.  Genitourinary:  Genitourinary Comments: No cva tenderness  Musculoskeletal: She  exhibits no edema.  Neurological: She is alert.  Skin: Skin is warm and dry. No rash noted. She is not diaphoretic.  Psychiatric: She has a normal mood and affect.  Nursing note and vitals reviewed.    ED Treatments / Results  Labs (all labs ordered are listed, but only abnormal results are displayed) Results for orders placed or performed during the hospital encounter of 10/22/17  CBC with Differential  Result Value Ref Range   WBC 11.8 (H) 4.0 - 10.5 K/uL   RBC 4.24 3.87 - 5.11 MIL/uL   Hemoglobin 11.8 (L) 12.0 - 15.0 g/dL   HCT 67.3 41.9 - 37.9 %   MCV 89.6 78.0 - 100.0 fL   MCH 27.8 26.0 - 34.0 pg   MCHC 31.1 30.0 - 36.0 g/dL   RDW 02.4 (H) 09.7 - 35.3 %   Platelets 173 150 - 400 K/uL   Neutrophils Relative % 81 %   Neutro Abs 9.4 (H) 1.7 - 7.7 K/uL   Lymphocytes Relative 6 %   Lymphs Abs 0.8 0.7 - 4.0 K/uL   Monocytes Relative 13 %   Monocytes Absolute 1.6 (H) 0.1 - 1.0 K/uL   Eosinophils Relative 0 %   Eosinophils Absolute 0.0 0.0 - 0.7 K/uL   Basophils Relative 0 %   Basophils Absolute 0.0 0.0 - 0.1 K/uL  Basic metabolic panel  Result Value Ref Range   Sodium 138 135 - 145 mmol/L   Potassium 3.8 3.5 - 5.1 mmol/L   Chloride 103 101 - 111 mmol/L   CO2 23 22 - 32 mmol/L   Glucose, Bld 136 (H) 65 - 99 mg/dL   BUN 12 6 - 20 mg/dL   Creatinine, Ser 2.99 0.44 - 1.00 mg/dL   Calcium 8.5 (L) 8.9 - 10.3 mg/dL   GFR calc non Af Amer 57 (L) >60 mL/min   GFR calc Af Amer >60 >60 mL/min   Anion gap 12 5 - 15  Influenza panel by PCR (type A & B)  Result Value Ref Range   Influenza A By PCR NEGATIVE NEGATIVE   Influenza B By PCR NEGATIVE NEGATIVE  Urinalysis, Routine w reflex microscopic  Result Value Ref Range   Color, Urine YELLOW YELLOW   APPearance CLEAR CLEAR   Specific Gravity, Urine 1.017 1.005 - 1.030   pH 5.0 5.0 - 8.0   Glucose, UA NEGATIVE NEGATIVE mg/dL   Hgb urine dipstick LARGE (A) NEGATIVE   Bilirubin Urine NEGATIVE NEGATIVE   Ketones, ur 5 (A)  NEGATIVE mg/dL   Protein, ur 30 (A) NEGATIVE mg/dL   Nitrite NEGATIVE NEGATIVE   Leukocytes, UA NEGATIVE NEGATIVE   RBC / HPF 6-30 0 - 5 RBC/hpf   WBC, UA 0-5 0 - 5 WBC/hpf   Bacteria, UA NONE SEEN NONE SEEN   Squamous Epithelial / LPF NONE SEEN NONE SEEN   Mucus PRESENT    Hyaline Casts, UA PRESENT   I-stat troponin, ED  Result Value Ref Range   Troponin i, poc 0.01 0.00 - 0.08 ng/mL   Comment 3          I-Stat CG4 Lactic Acid, ED  Result Value Ref Range   Lactic Acid, Venous 1.21 0.5 - 1.9 mmol/L  I-Stat CG4 Lactic Acid, ED  Result Value Ref Range   Lactic Acid, Venous 0.98 0.5 - 1.9 mmol/L   Dg Chest 2 View  Result Date: 10/22/2017 CLINICAL DATA:  Dyspnea and fever EXAM: CHEST  2 VIEW COMPARISON:  11/17/2014 chest radiograph. FINDINGS: Stable cardiomediastinal silhouette with mild cardiomegaly. No pneumothorax. No pleural effusion. Mild patchy right lower parahilar lung opacity. Multiple mid to lower thoracic and upper lumbar vertebral compression deformities appear chronic and stable. IMPRESSION: Mild patchy right lower parahilar lung opacity, which could represent atelectasis, aspiration or pneumonia. Recommend short-term follow-up PA and lateral chest radiographs. Mild cardiomegaly. Electronically Signed   By: Delbert Phenix M.D.   On: 10/22/2017 07:24    EKG  EKG Interpretation  Date/Time:  Sunday October 22 2017 06:47:20 EST Ventricular Rate:  113 PR Interval:    QRS Duration: 84 QT Interval:  350 QTC Calculation: 480 R Axis:   53 Text Interpretation:  Sinus tachycardia with PVCs Nonspecific ST abnormality Abnormal ECG Confirmed by Rochele Raring [phone]) on 10/22/2017 6:49:56 AM       Radiology Dg Chest 2 View  Result Date: 10/22/2017 CLINICAL DATA:  Dyspnea and fever EXAM: CHEST  2 VIEW COMPARISON:  11/17/2014 chest radiograph. FINDINGS: Stable cardiomediastinal silhouette with mild cardiomegaly. No pneumothorax. No pleural effusion. Mild patchy right lower parahilar  lung opacity. Multiple mid to lower thoracic and upper lumbar vertebral compression deformities appear chronic and stable. IMPRESSION: Mild patchy right lower parahilar lung opacity, which could represent atelectasis, aspiration or pneumonia. Recommend short-term follow-up PA and lateral chest radiographs. Mild cardiomegaly. Electronically Signed   By: Delbert Phenix M.D.   On: 10/22/2017 07:24    Procedures Procedures (including critical care time)  Medications Ordered in ED Medications  levofloxacin (LEVAQUIN) IVPB 500 mg (not administered)  albuterol (PROVENTIL) (2.5 MG/3ML) 0.083% nebulizer solution 5 mg (not administered)     Initial Impression / Assessment and Plan / ED Course  I have reviewed the triage vital signs and the nursing notes.  Pertinent labs & imaging results that were available during my care of the patient were reviewed by me and considered in my medical decision making (see chart for details).  Iv ns.   Continuous pulse ox/monitor.  Initial ecg irregular/tachy ?rhythm. On observing on monitor, and repeat ecg, pt in sinus rhythm. Denies cp or discomfort.   From ECF paperwork, DNR/DNI status noted, confirmed w pt.   Mild wheezing on recheck. Albuterol neb.   cxr reviewed - pna on cxr.  Iv abx given.  Labs reviewed, renal fxn c/w baseline.   Po fluids.   rx provided.   Patient remains dyspneic appearing although improved from initial.   Given hypoxia, wheezing, pna - will plan for admission.  Medical Service consulted for admission.  Final Clinical Impressions(s) / ED Diagnoses   Final diagnoses:  None    ED Discharge Orders    None       Cathren Laine, MD 10/22/17 1123

## 2017-10-23 ENCOUNTER — Inpatient Hospital Stay (HOSPITAL_COMMUNITY): Payer: Medicare HMO

## 2017-10-23 DIAGNOSIS — R0602 Shortness of breath: Secondary | ICD-10-CM

## 2017-10-23 DIAGNOSIS — F0390 Unspecified dementia without behavioral disturbance: Secondary | ICD-10-CM

## 2017-10-23 LAB — CBC
HEMATOCRIT: 35.9 % — AB (ref 36.0–46.0)
Hemoglobin: 10.8 g/dL — ABNORMAL LOW (ref 12.0–15.0)
MCH: 27.2 pg (ref 26.0–34.0)
MCHC: 30.1 g/dL (ref 30.0–36.0)
MCV: 90.4 fL (ref 78.0–100.0)
Platelets: 146 10*3/uL — ABNORMAL LOW (ref 150–400)
RBC: 3.97 MIL/uL (ref 3.87–5.11)
RDW: 16 % — ABNORMAL HIGH (ref 11.5–15.5)
WBC: 6.2 10*3/uL (ref 4.0–10.5)

## 2017-10-23 LAB — BASIC METABOLIC PANEL
Anion gap: 8 (ref 5–15)
BUN: 13 mg/dL (ref 6–20)
CO2: 22 mmol/L (ref 22–32)
CREATININE: 0.73 mg/dL (ref 0.44–1.00)
Calcium: 8.1 mg/dL — ABNORMAL LOW (ref 8.9–10.3)
Chloride: 110 mmol/L (ref 101–111)
GFR calc non Af Amer: >60 mL/min (ref >60)
Glucose, Bld: 98 mg/dL (ref 65–99)
POTASSIUM: 3.5 mmol/L (ref 3.5–5.1)
SODIUM: 140 mmol/L (ref 135–145)

## 2017-10-23 MED ORDER — GLYCOPYRROLATE 0.2 MG/ML IJ SOLN
0.4000 mg | Freq: Four times a day (QID) | INTRAMUSCULAR | Status: DC
  Administered 2017-10-23 – 2017-10-30 (×28): 0.4 mg via INTRAVENOUS
  Filled 2017-10-23 (×31): qty 2

## 2017-10-23 NOTE — Evaluation (Signed)
Clinical/Bedside Swallow Evaluation Patient Details  Name: Connie Wagner MRN: [id number] Date of Birth: December 21, 1928  Today's Date: 10/23/2017 Time: SLP Start Time (ACUTE ONLY): 1502 SLP Stop Time (ACUTE ONLY): 1532 SLP Time Calculation (min) (ACUTE ONLY): 30 min  Past Medical History:  Past Medical History:  Diagnosis Date  . Anemia    iron deficient  . Anxiety   . CAD (coronary artery disease)    a. s/p Inf STEMI 2/14 => LHC 10/18/12: Diffuse distal LM 30%, LAD 50% calcified stenosis just after first septal perforator, diagonal branches with mild 20-30% stenosis, mid RCA 99%. PCI: Promus Premier DES to the mid RCA  . Carotid stenosis    a. LifeLine screening done in 2009 with carotid stenosis noted;  b.  Carotid dopplers 3/14: R 0-39%, L 40-59%, repeat 1 year  . Chronic lower back pain   . 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 Dementia    needs redirection freq.  . Depression   . Hypertension   . Ischemic cardiomyopathy    a. Echocardiogram 10/18/12: Mild LVH, inferior and inferoseptal AK, posterior HK, EF 45%, grade 1 diastolic dysfunction, aortic sclerosis without stenosis, mild MR, mild LAE, RV systolic function moderately reduced (pacer wire in right ventricle), PASP 32  . Migraines    "haven't had one since going thru menopause"  . Rheumatoid arthritis(714.0)   . Upper GI bleeding 09/14/11   "have had it once before today"   Past Surgical History:  Past Surgical History:  Procedure Laterality Date  . BACK SURGERY    . BREAST LUMPECTOMY     "benign tumor removed"  . ESOPHAGOGASTRODUODENOSCOPY  09/15/2011   Procedure: ESOPHAGOGASTRODUODENOSCOPY (EGD);  Surgeon: Barrie Folk, MD;  Location: [facility] ENDOSCOPY;  Service: Endoscopy;  Laterality: N/A;  . HARVEST BONE GRAFT  08/17/2012   Procedure: HARVEST ILIAC BONE GRAFT;  Surgeon: Eulas Post, MD;  Location: [facility] OR;  Service: Orthopedics;   Laterality: Right;  . HEMATOMA EVACUATION  10/2002   lumbar  . INCISION AND DRAINAGE OF WOUND  10/2002   lumbar wound infection  . JOINT REPLACEMENT    . KNEE ARTHROSCOPY  01/2001   right  . LEFT HEART CATHETERIZATION WITH CORONARY ANGIOGRAM N/A 10/18/2012   Procedure: LEFT HEART CATHETERIZATION WITH CORONARY ANGIOGRAM;  Surgeon: Tonny Bollman, MD;  Location: [facility] CATH LAB;  Service: Cardiovascular;  Laterality: N/A;  . LUMBAR DISC SURGERY  09/2002   Hemilaminectomy, L3, complete laminectomy, L4, hemi- semilaminectomy, L5 and right S1, and diskectomy, L5-S1.  Marland Kitchen LUMBAR LAMINECTOMY  10/2003   for epidural abscess evacuation; L4-5  . ORIF HUMERUS FRACTURE  08/17/2012   Procedure: OPEN REDUCTION INTERNAL FIXATION (ORIF) DISTAL HUMERUS FRACTURE;  Surgeon: Eulas Post, MD;  Location: [facility] OR;  Service: Orthopedics;  Laterality: Right;  ORIF RIGHT SUPRACHONDULAR HUMERUS FRACTURE  . TOTAL KNEE ARTHROPLASTY  01/2002   right   HPI:   84 y.o. female with medical history significant for Alzheimer's dementia, nursing home resident, history of CAD status post MI in 2014, ischemic cardiomyopathy, chronic low back pain, rheumatoid arthritis, GERD, depression, anxiety, anemia, hypertension, hyperlipidemia, recently started on Tamiflu on 10/13/2017 at the facility (per daughter's report, there were cases of flu at the nursing home, for which everybody was placed preventatively with the same med).  Since then, the patient has been experiencing several day history of progressive shortness of breath, with wheezing.  Per daughter's report, the patient had  fallen at the facility, for which she was seen at the infirmary, where they noticed the patient having some respiratory distress, despite good O2 sats.    Assessment / Plan / Recommendation Clinical Impression   Pt with wet, congested vocal quality and slight wheezing upon SLP arrival; however, during consumption of various PO's, pt appeared to exhibit a timely swallow  with thin liquids in successive swallows-solids with adequate mastication; pt did exhibit a delayed cough after intake of all solids/liquids; nursing stated no difficulty with medication administration this date; Due to pt's cognitive impairment, she required minimal verbal cueing to continue with meal; vocal quality appeared to improve with liquid wash and repetitive swallows, but objective testing may be considered if vocal quality doesn't improve fully to assess a potential cognitive-based dysphagia; ST will f/u for diet tolerance and need for objective assessment while in acute setting. SLP Visit Diagnosis: Dysphagia, pharyngeal phase (R13.13)    Aspiration Risk  Mild aspiration risk    Diet Recommendation   Regular/thin liquids  Medication Administration: Whole meds with puree    Other  Recommendations Oral Care Recommendations: Oral care BID   Follow up Recommendations Skilled Nursing facility      Frequency and Duration min 2x/week  1 week       Prognosis Prognosis for Safe Diet Advancement: Good Barriers to Reach Goals: Cognitive deficits      Swallow Study   General Date of Onset: 10/22/17 HPI:  84 y.o. female with medical history significant for Alzheimer's dementia, nursing home resident, history of CAD status post MI in 2014, ischemic cardiomyopathy, chronic low back pain, rheumatoid arthritis, GERD, depression, anxiety, anemia, hypertension, hyperlipidemia, recently started on Tamiflu on 10/13/2017 at the facility (per daughter's report, there were cases of flu at the nursing home, for which everybody was placed preventatively with the same med).  Since then, the patient has been experiencing several day history of progressive shortness of breath, with wheezing.  Per daughter's report, the patient had fallen at the facility, for which she was seen at the infirmary, where they noticed the patient having some respiratory distress, despite good O2 sats.  Type of Study: Bedside  Swallow Evaluation Previous Swallow Assessment: n/a Diet Prior to this Study: Regular;Thin liquids Temperature Spikes Noted: No Respiratory Status: Room air History of Recent Intubation: No Behavior/Cognition: Alert;Confused;Agitated Oral Cavity Assessment: Within Functional Limits Oral Care Completed by SLP: Recent completion by staff Oral Cavity - Dentition: Adequate natural dentition Vision: Functional for self-feeding Self-Feeding Abilities: Able to feed self;Needs set up Patient Positioning: Upright in bed Baseline Vocal Quality: Wet;Other (comment)(wheezing when SLP entered room) Volitional Cough: Strong Volitional Swallow: Able to elicit    Oral/Motor/Sensory Function Overall Oral Motor/Sensory Function: Within functional limits   Ice Chips Ice chips: Not tested   Thin Liquid Thin Liquid: Impaired Presentation: Cup;Straw Pharyngeal  Phase Impairments: Throat Clearing - Delayed    Nectar Thick Nectar Thick Liquid: Not tested   Honey Thick Honey Thick Liquid: Not tested   Puree Puree: Within functional limits Presentation: Self Fed   Solid      Solid: Within functional limits Presentation: Self Fed        Tressie Stalker, M.S., CCC-SLP 10/23/2017,4:39 PM

## 2017-10-23 NOTE — Progress Notes (Signed)
PROGRESS NOTE    Connie Wagner  LZJ:[id number] DOB: [birth year] DOA: 10/22/2017 PCP: Merlene Laughter, MD   Outpatient Specialists:     Brief Narrative:  Connie Wagner is a 84 y.o. female with medical history significant for Alzheimer's dementia, nursing home resident, history of CAD status post MI in 2014, ischemic cardiomyopathy, chronic low back pain, rheumatoid arthritis, GERD, depression, anxiety, anemia, hypertension, hyperlipidemia, recently started on Tamiflu on 10/13/2017 at the facility (per daughter's report, there were cases of flu at the nursing home, for which everybody was placed preventatively with the same med).  Since then, the patient has been experiencing several day history of progressive shortness of breath, with wheezing.  Per daughter's report, the patient had fallen at the facility, for which she was seen at the infirmary, where they noticed the patient having some respiratory distress, despite good O2 sats.  She had a T-max of 99.4.  She was sent to the ED for further evaluation and management of her symptoms.     Assessment & Plan:   Principal Problem:   Pneumonia Active Problems:   Tachycardia   HTN (hypertension)   Anxiety   Dementia   Depression   Normocytic anemia   Coronary artery disease   Stroke (cerebrum) (HCC)   Carotid stenosis, bilateral   Shortness of breath   Progressive shortness of breath and cough in the setting of Pneumonia  - Influenza by PCR was negativ  -IV antibiotics with Cefepime and Vanco -Strep pneumo urine antigen negative -Nebulizers as needed, with Xopenex q 6 prn  -Mucinex prn   Hypertension -monitor  Hyperlipidemia Continue home statins  History of ICM/CAD, last 2D echo  in November 2018 shows grade 1 diastolic dysfunction, EF 60-65%, normal systolic function in the left ventricle. Sinus tachycardia with PVCs, no significant changes from prior  Mechanical fall with bruising of the R little finger. Offered  XR to r/o fracture, patient and daughter declined   History of Dementia Continue Seroquel for sleep   GERD, no acute symptoms Continue PPI     DVT prophylaxis:  Lovenox   Code Status: DNR   Family Communication: Called and discussed with daughter--palliative care meeting tomm  Disposition Plan:     Consultants:    Subjective: Will awaken but confused and wet sounding voice  Objective: Vitals:   10/22/17 1836 10/22/17 2115 10/23/17 0513 10/23/17 1335  BP: 122/63 (!) 124/55 (!) 139/53 (!) 137/53  Pulse: 87 (!) 119 87 (!) 111  Resp: 20 16 16 16   Temp: 98.8 F (37.1 C) 98.7 F (37.1 C) 99.2 F (37.3 C) 98.7 F (37.1 C)  TempSrc:  Oral Oral Oral  SpO2: 93% 94% 96% 94%  Weight:   71.6 kg (157 lb 13.6 oz)     Intake/Output Summary (Last 24 hours) at 10/23/2017 1340 Last data filed at 10/23/2017 0400 Gross per 24 hour  Intake 347.5 ml  Output -  Net 347.5 ml   Filed Weights   10/23/17 0513  Weight: 71.6 kg (157 lb 13.6 oz)    Examination:  General exam: sleeping but will awaken Respiratory system: coarse breath sounds, no wheezing-- wet upper airway Cardiovascular system: rrr Gastrointestinal system: Abdomen is nondistended, soft and nontender. No organomegaly or masses felt. Normal bowel sounds heard. Central nervous system: confused, hallucinating  Skin: No rashes, lesions or ulcers     Data Reviewed: I have personally reviewed following labs and imaging studies  CBC: Recent Labs  Lab 10/22/17 0700 10/23/17 0504  WBC 11.8* 6.2  NEUTROABS 9.4*  --   HGB 11.8* 10.8*  HCT 38.0 35.9*  MCV 89.6 90.4  PLT 173 146*   Basic Metabolic Panel: Recent Labs  Lab 10/22/17 0700 10/23/17 0504  NA 138 140  K 3.8 3.5  CL 103 110  CO2 23 22  GLUCOSE 136* 98  BUN 12 13  CREATININE 0.89 0.73  CALCIUM 8.5* 8.1*   GFR: Estimated Creatinine Clearance: 45.9 mL/min (by C-G formula based on SCr of 0.73 mg/dL). Liver Function Tests: No results for  input(s): AST, ALT, ALKPHOS, BILITOT, PROT, ALBUMIN in the last 168 hours. No results for input(s): LIPASE, AMYLASE in the last 168 hours. No results for input(s): AMMONIA in the last 168 hours. Coagulation Profile: No results for input(s): INR, PROTIME in the last 168 hours. Cardiac Enzymes: No results for input(s): CKTOTAL, CKMB, CKMBINDEX, TROPONINI in the last 168 hours. BNP (last 3 results) No results for input(s): PROBNP in the last 8760 hours. HbA1C: No results for input(s): HGBA1C in the last 72 hours. CBG: No results for input(s): GLUCAP in the last 168 hours. Lipid Profile: No results for input(s): CHOL, HDL, LDLCALC, TRIG, CHOLHDL, LDLDIRECT in the last 72 hours. Thyroid Function Tests: No results for input(s): TSH, T4TOTAL, FREET4, T3FREE, THYROIDAB in the last 72 hours. Anemia Panel: No results for input(s): VITAMINB12, FOLATE, FERRITIN, TIBC, IRON, RETICCTPCT in the last 72 hours. Urine analysis:    Component Value Date/Time   COLORURINE YELLOW 10/22/2017 0648   APPEARANCEUR CLEAR 10/22/2017 0648   LABSPEC 1.017 10/22/2017 0648   PHURINE 5.0 10/22/2017 0648   GLUCOSEU NEGATIVE 10/22/2017 0648   HGBUR LARGE (A) 10/22/2017 0648   BILIRUBINUR NEGATIVE 10/22/2017 0648   KETONESUR 5 (A) 10/22/2017 0648   PROTEINUR 30 (A) 10/22/2017 0648   UROBILINOGEN 0.2 06/08/2015 1700   NITRITE NEGATIVE 10/22/2017 0648   LEUKOCYTESUR NEGATIVE 10/22/2017 0648      Recent Results (from the past 240 hour(s))  MRSA PCR Screening     Status: None   Collection Time: 10/22/17  8:45 PM  Result Value Ref Range Status   MRSA by PCR NEGATIVE NEGATIVE Final    Comment:        The GeneXpert MRSA Assay (FDA approved for NASAL specimens only), is one component of a comprehensive MRSA colonization surveillance program. It is not intended to diagnose MRSA infection nor to guide or monitor treatment for MRSA infections. Performed at [facility], [address], [place],  [place] [address]       Anti-infectives (From admission, onward)   Start     Dose/Rate Route Frequency Ordered Stop   10/23/17 0000  levofloxacin (LEVAQUIN) 500 MG tablet     500 mg Oral Daily 10/22/17 0917     10/22/17 1330  ceFEPIme (MAXIPIME) 1 g in sodium chloride 0.9 % 100 mL IVPB     1 g 200 mL/hr over 30 Minutes Intravenous Every 12 hours 10/22/17 1201 10/30/17 0959   10/22/17 0830  levofloxacin (LEVAQUIN) IVPB 500 mg     500 mg 100 mL/hr over 60 Minutes Intravenous  Once 10/22/17 0748 10/22/17 1055       Radiology Studies: Dg Chest 2 View  Result Date: 10/23/2017 CLINICAL DATA:  Pneumonia EXAM: CHEST  2 VIEW COMPARISON:  10/22/2017, 10/20/2012 FINDINGS: Bilateral mild interstitial thickening. No pleural effusion or pneumothorax. No focal consolidation. Stable cardiomegaly. Thoracic aortic atherosclerosis. Mild osteoarthritis of the left glenohumeral joint. Chronic midthoracic spine vertebral body compression fractures.  IMPRESSION: Cardiomegaly with mild pulmonary vascular congestion. Electronically Signed   By: Elige Ko   On: 10/23/2017 09:06   Dg Chest 2 View  Result Date: 10/22/2017 CLINICAL DATA:  Dyspnea and fever EXAM: CHEST  2 VIEW COMPARISON:  11/17/2014 chest radiograph. FINDINGS: Stable cardiomediastinal silhouette with mild cardiomegaly. No pneumothorax. No pleural effusion. Mild patchy right lower parahilar lung opacity. Multiple mid to lower thoracic and upper lumbar vertebral compression deformities appear chronic and stable. IMPRESSION: Mild patchy right lower parahilar lung opacity, which could represent atelectasis, aspiration or pneumonia. Recommend short-term follow-up PA and lateral chest radiographs. Mild cardiomegaly. Electronically Signed   By: Delbert Phenix M.D.   On: 10/22/2017 07:24        Scheduled Meds: . aspirin  81 mg Oral Daily  . enoxaparin (LOVENOX) injection  40 mg Subcutaneous Q24H  . glycopyrrolate  0.4 mg Intravenous Q6H  . pantoprazole  40  mg Oral Daily  . pravastatin  40 mg Oral q1800  . QUEtiapine  25 mg Oral BID   Continuous Infusions: . ceFEPime (MAXIPIME) IV 1 g (10/23/17 1006)     LOS: 1 day    Time spent: 35 min    Joseph Art, DO [facility] Pager [phone]  If 7PM-7AM, please contact night-coverage [email or web address] Password [email or web address] 10/23/2017, 1:40 PM

## 2017-10-23 NOTE — Progress Notes (Addendum)
Palliative Medicine RN Note: Consult order noted. Patient is grunting in the bed with wet respirations. No family present. Left message for daughter on cell phone and on home phone.   Obtained orders to adjust some medications per Dr Phillips Odor until meeting can be had to move to full comfort.  Connie Chance Evaluna Utke, RN, BSN, [facility] Palliative Medicine Team 10/23/2017 1:03 PM Office [phone]  UPDATE:

## 2017-10-24 DIAGNOSIS — Z7189 Other specified counseling: Secondary | ICD-10-CM

## 2017-10-24 DIAGNOSIS — R531 Weakness: Secondary | ICD-10-CM

## 2017-10-24 DIAGNOSIS — Z515 Encounter for palliative care: Secondary | ICD-10-CM

## 2017-10-24 DIAGNOSIS — R062 Wheezing: Secondary | ICD-10-CM

## 2017-10-24 MED ORDER — POLYETHYLENE GLYCOL 3350 17 G PO PACK
17.0000 g | PACK | Freq: Every day | ORAL | Status: DC
  Administered 2017-10-24 – 2017-10-30 (×7): 17 g via ORAL
  Filled 2017-10-24 (×7): qty 1

## 2017-10-24 NOTE — Consult Note (Signed)
Consultation Note Date: 10/24/2017   Patient Name: Connie Wagner  DOB: [birth year]  MRN: [id number]  Age / Sex: 84 y.o., female  PCP: Lajean Manes, MD Referring Physician: Geradine Girt, DO  Reason for Consultation: Establishing goals of care  HPI/Patient Profile: 84 y.o. female  with past medical history of dementia, ischemic cardiomyopathy, hypertension, MI 2014, CAD, anemia, anxiety, depression, upper GI bleed, rheumatoid arthritis, migraines admitted on 10/22/2017 with shortness of breath, cough, and fall. Patient started on Tamiflu on 10/13/17 due to flu cases within the nursing facility. Since then, patient experiencing shortness of breath and wheezing. The day of admission, patient experienced a fall with respiratory distress. In ED, flu negative. Chest xray revealed mild patchy right lower parahilar lung opacity--atelectasis, aspiration, or pneumonia. Started on IV antibiotics. Palliative medicine consultation for goals of care.   Clinical Assessment and Goals of Care: I have reviewed medical records, discussed with care team, and met with patient and daughter Connie Wagner) at bedside to discuss diagnosis prognosis, [place], EOL wishes, disposition and options.  Patient is awake, alert, pleasantly confused with baseline dementia. She does not participate in [place] conversation.   Introduced Palliative Medicine as specialized medical care for people living with serious illness. It focuses on providing relief from the symptoms and stress of a serious illness. The goal is to improve quality of life for both the patient and the family.  We discussed a brief life review of the patient. Widowed. 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 Lives at [facility] care unit. Connie Wagner is her only living child. She was officially diagnosed with dementia last August. Baseline, Connie Wagner requires assist with bathing but fairly  independent at facility and ambulates with a walker. Connie Wagner speaks of a decline in her nutritional status in the last two weeks. She will take few bites and be done. She enjoys participating in group activities at [employer].  Discussed events leading up to hospitalization, diagnoses, and interventions. Educated on disease trajectory of dementia.   Advanced directives, concepts specific to code status, artifical feeding and hydration, and rehospitalization were considered and discussed. Connie Wagner speaks clearly of her mother's wishes against "heroic measures" including resuscitation, life support, and feeding tube.   I attempted to elicit values and goals of care important to the patient. Connie Wagner confirms that "comfort" is the main goal for her mother. She tells me her mother has spoke many times of being ready to die and not afraid to die. We discussed her poor nutritional status. Connie Wagner is NOT interested in starting appetite stimulant.   Introduced MOST form and encouraged Connie Wagner to review and complete with me tomorrow if interested.  Connie Wagner worries that Connie Wagner may not take her mother back if she requires more assist. We discussed placing physical therapy evaluation to see what she will do functionally. Educated and discussed hospice options and that [facility] may accept her mother back with hospice services involved--understanding focus on comfort, symptom management, and preventing re-hospitalization.   Connie Wagner is waiting to see how  her mother does with physical therapy before making decision to pursue hospice.   Questions and concerns were addressed. PMT contact information given.     SUMMARY OF RECOMMENDATIONS    Daughter confirms her mother's wishes against heroic measures at EOL, including resuscitation, life support, feeding tube.   Introduced MOST form and may complete with daughter tomorrow.   PT evaluation pending.   Continue current interventions and medical management for pneumonia. No  escalation of care if she clinically declines.   Daughter considering hospice services. SW consult to evaluate if Memory Care unit will accept patient back with hospice.  PMT will f/u in AM.   Code Status/Advance Care Planning:  DNR  Symptom Management:   Per attending  Palliative Prophylaxis:   Aspiration, Delirium Protocol, Frequent Pain Assessment, Oral Care and Turn Reposition  Additional Recommendations (Limitations, Scope, Preferences):  DNR/DNI. Continue medical management.  Psycho-social/Spiritual:   Desire for further Chaplaincy support:yes  Additional Recommendations: Caregiving  Support/Resources and Education on Hospice  Prognosis:   Unable to determine  Discharge Planning: To Be Determined      Primary Diagnoses: Present on Admission: . HTN (hypertension) . Anxiety . Dementia . Depression . Normocytic anemia . Coronary artery disease . Carotid stenosis, bilateral . Stroke (cerebrum) ([place]) . Pneumonia . Tachycardia   I have reviewed the medical record, interviewed the patient and family, and examined the patient. The following aspects are pertinent.  Past Medical History:  Diagnosis Date  . Anemia    iron deficient  . Anxiety   . CAD (coronary artery disease)    a. s/p Inf STEMI 2/14 => LHC 10/18/12: Diffuse distal LM 30%, LAD 50% calcified stenosis just after first septal perforator, diagonal branches with mild 20-30% stenosis, mid RCA 99%. PCI: Promus Premier DES to the mid RCA  . Carotid stenosis    a. LifeLine screening done in 2009 with carotid stenosis noted;  b.  Carotid dopplers 3/14: R 0-39%, L 40-59%, repeat 1 year  . Chronic lower back pain   . 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 Dementia    needs redirection freq.  . Depression   . Hypertension   . Ischemic cardiomyopathy    a. Echocardiogram 10/18/12: Mild LVH, inferior and inferoseptal AK,  posterior HK, EF 59%, grade 1 diastolic dysfunction, aortic sclerosis without stenosis, mild MR, mild LAE, RV systolic function moderately reduced (pacer wire in right ventricle), PASP 32  . Migraines    "haven't had one since going thru menopause"  . Rheumatoid arthritis(714.0)   . Upper GI bleeding 09/14/11   "have had it once before today"   Social History   Socioeconomic History  . Marital status: Widowed    Spouse name: None  . Number of children: None  . Years of education: None  . Highest education level: None  Social Needs  . Financial resource strain: None  . Food insecurity - worry: None  . Food insecurity - inability: None  . Transportation needs - medical: None  . Transportation needs - non-medical: None  Occupational History  . 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 Smoking status: Never Smoker  . Smokeless tobacco: Never Used  Substance and Sexual Activity  . Alcohol use: Yes    Alcohol/week: 3.0 oz    Types: 5 Glasses of wine per week  . Drug use: No  . Sexual activity: No  Other Topics  Concern  . None  Social History Narrative   Daughter and Emergency Contact: Reather Laurence   Cell [phone]   Home [phone]   Family History  Problem Relation Age of Onset  . Heart attack Mother   . Stroke Mother   . Hypertension Sister   . Anesthesia problems Neg Hx   . Hypotension Neg Hx   . Malignant hyperthermia Neg Hx   . Pseudochol deficiency Neg Hx    Scheduled Meds: . aspirin  81 mg Oral Daily  . enoxaparin (LOVENOX) injection  40 mg Subcutaneous Q24H  . glycopyrrolate  0.4 mg Intravenous Q6H  . pantoprazole  40 mg Oral Daily  . pravastatin  40 mg Oral q1800  . QUEtiapine  25 mg Oral BID   Continuous Infusions: . ceFEPime (MAXIPIME) IV Stopped (10/23/17 2157)   PRN Meds:.antiseptic oral rinse, guaiFENesin, hydrALAZINE, levalbuterol, polyvinyl alcohol Medications Prior to Admission:  Prior to Admission medications     Medication Sig Start Date End Date Taking? Authorizing Provider  acetaminophen (TYLENOL) 325 MG tablet Take 2 tablets (650 mg total) every 6 (six) hours as needed by mouth for mild pain or fever. 07/06/17  Yes Costello, Kayren Eaves, NP  aspirin EC 81 MG EC tablet Take 1 tablet (81 mg total) daily by mouth. 07/06/17  Yes Costello, Kayren Eaves, NP  oseltamivir (TAMIFLU) 75 MG capsule Take 75 mg by mouth daily. 10 day course started 10/13/17   Yes [provider]  pravastatin (PRAVACHOL) 40 MG tablet Take 1 tablet (40 mg total) daily at 6 PM by mouth. 07/06/17  Yes Costello, Kayren Eaves, NP  levofloxacin (LEVAQUIN) 500 MG tablet Take 1 tablet (500 mg total) by mouth daily. 10/23/17   Lajean Saver, MD  pantoprazole (PROTONIX) 40 MG tablet Take 1 tablet (40 mg total) daily by mouth. Patient not taking: Reported on 10/22/2017 07/06/17   Mary Sella, NP  QUEtiapine (SEROQUEL) 25 MG tablet Take 1 tablet (25 mg total) 2 (two) times daily by mouth. Patient not taking: Reported on 10/22/2017 07/06/17   Mary Sella, NP   Allergies  Allergen Reactions  . Augmentin [Amoxicillin-Pot Clavulanate] Other (See Comments)    On MAR   . Codeine Other (See Comments)    On MAR  . Cozaar [Losartan Potassium] Other (See Comments)    On MAR  . Evista [Raloxifene Hcl] Other (See Comments)    On MAR  . Flagyl [Metronidazole] Other (See Comments)    On MAR  . Iron Nausea And Vomiting    Loose stool  . Lexapro [Escitalopram Oxalate] Other (See Comments)    On MAR  . Tetracyclines & Related Other (See Comments)    On MAR  . Penicillins Rash    Has patient had a PCN reaction causing immediate rash, facial/tongue/throat swelling, SOB or lightheadedness with hypotension: No  Has patient had a PCN reaction causing severe rash involving mucus membranes or skin necrosis: No Has patient had a PCN reaction that required hospitalization No Has patient had a PCN reaction occurring within the last 10 years: No If all of the  above answers are "NO", then may proceed with Cephalosporin use.   . Sulfa Drugs Cross Reactors Rash   Review of Systems  Unable to perform ROS: Dementia   Physical Exam  Constitutional: She is cooperative. She appears ill.  HENT:  Head: Normocephalic and atraumatic.  Cardiovascular: Regular rhythm.  Pulmonary/Chest: No accessory muscle usage. No tachypnea. No respiratory distress. She has decreased  breath sounds. She has rhonchi.  Neurological: She is alert.  Pleasantly confused. Baseline dementia.  Skin: Skin is warm and dry. There is pallor.  Psychiatric: Her speech is delayed. Cognition and memory are impaired. She is inattentive.  Nursing note and vitals reviewed.  Vital Signs: BP (!) 133/96 (BP Location: Right Arm)   Pulse (!) 114   Temp 97.6 F (36.4 C) (Oral)   Resp 18   Wt 71.6 kg (157 lb 13.6 oz)   SpO2 93%   BMI 28.87 kg/m  Pain Assessment: No/denies pain   Pain Score: 0-No pain  SpO2: SpO2: 93 % O2 Device:SpO2: 93 % O2 Flow Rate: .O2 Flow Rate (L/min): 2 L/min  IO: Intake/output summary: No intake or output data in the 24 hours ending 10/24/17 0947  LBM:   Baseline Weight: Weight: 71.6 kg (157 lb 13.6 oz) Most recent weight: Weight: 71.6 kg (157 lb 13.6 oz)     Palliative Assessment/Data: PPS 40%   Flowsheet Rows     Most Recent Value  Intake Tab  Referral Department  Hospitalist  Unit at Time of Referral  Med/Surg Unit  Palliative Care Primary Diagnosis  Sepsis/Infectious Disease  Palliative Care Type  New Palliative care  Reason for referral  Clarify Goals of Care  Date first seen by Palliative Care  10/23/17  Clinical Assessment  Palliative Performance Scale Score  40%  Psychosocial & Spiritual Assessment  Palliative Care Outcomes  Patient/Family meeting held?  Yes  Who was at the meeting?  patient and daughter  Palliative Care Outcomes  Clarified goals of care, Provided end of life care assistance, Provided psychosocial or spiritual support,  ACP counseling assistance, Counseled regarding hospice      Time In: 0845 Time Out: 0955 Time Total: 52mn Greater than 50%  of this time was spent counseling and coordinating care related to the above assessment and plan.  Signed by:  MIhor Dow FNP-C Palliative Medicine Team  Phone: [phone]: [phone]  Please contact Palliative Medicine Team phone at [phone] questions and concerns.  For individual provider: See AShea Evans

## 2017-10-24 NOTE — Progress Notes (Addendum)
PROGRESS NOTE    Connie Wagner  LEX:[id number] DOB: 06/24/1929 DOA: 10/22/2017 PCP: Merlene Laughter, MD   Outpatient Specialists:     Brief Narrative:  Connie Wagner is a 84 y.o. female with medical history significant for Alzheimer's dementia, nursing home resident, history of CAD status post MI in 2014, ischemic cardiomyopathy, chronic low back pain, rheumatoid arthritis, GERD, depression, anxiety, anemia, hypertension, hyperlipidemia, recently started on Tamiflu on 10/13/2017 at the facility (per daughter's report, there were cases of flu at the nursing home, for which everybody was placed preventatively with the same med).  Since then, the patient has been experiencing several day history of progressive shortness of breath, with wheezing.  Per daughter's report, the patient had fallen at the facility, for which she was seen at the infirmary, where they noticed the patient having some respiratory distress, despite good O2 sats.  She had a T-max of 99.4.  She was sent to the ED for further evaluation and management of her symptoms.     Assessment & Plan:   Principal Problem:   Pneumonia Active Problems:   Tachycardia   HTN (hypertension)   Anxiety   Dementia   Depression   Normocytic anemia   Coronary artery disease   Stroke (cerebrum) (HCC)   Carotid stenosis, bilateral   Shortness of breath   Wheezing   Weakness   Palliative care by specialist   Goals of care, counseling/discussion   Progressive shortness of breath and cough in the setting of Pneumonia  - Influenza by PCR was negative  -IV antibiotics with Cefepime and Vanco -Strep pneumo urine antigen negative -Nebulizers as needed, with Xopenex q 6 prn  -Mucinex prn   Hypertension -monitor  Hyperlipidemia Continue home statins  History of ICM/CAD, last 2D echo  in November 2018 shows grade 1 diastolic dysfunction, EF 60-65%, normal systolic function in the left ventricle. Sinus tachycardia with PVCs, no  significant changes from prior  Mechanical fall with bruising of the R little finger. Offered XR to r/o fracture, patient and daughter declined  -patient moves fingers well  History of Dementia Continue Seroquel for sleep   GERD, no acute symptoms Continue PPI   Poor overall prognosis  DVT prophylaxis:  Lovenox   Code Status: DNR   Family Communication Daughter at bedside  Disposition Plan:  Hospice?   Consultants:  Palliative care  Subjective: Up talking with palliative care and family  Objective: Vitals:   10/23/17 1335 10/23/17 2153 10/24/17 0544 10/24/17 1424  BP: (!) 137/53 (!) 141/87 (!) 133/96 125/75  Pulse: (!) 111 (!) 107 (!) 114 (!) 117  Resp: 16 17 18 20   Temp: 98.7 F (37.1 C) 98.7 F (37.1 C) 97.6 F (36.4 C) 98.9 F (37.2 C)  TempSrc: Oral Oral Oral   SpO2: 94% 95% 93% 95%  Weight:        Intake/Output Summary (Last 24 hours) at 10/24/2017 1530 Last data filed at 10/24/2017 1006 Gross per 24 hour  Intake 320 ml  Output -  Net 320 ml   Filed Weights   10/23/17 0513  Weight: 71.6 kg (157 lb 13.6 oz)    Examination:  General exam: awake, speaking with family Respiratory system: moving more air but still wet sounding airway Cardiovascular system: rrr Gastrointestinal system: +Bs, soft Central nervous system: more oriented and not hallucinating but easily distracted from train of thought Moves all 4 ext- makes fist with right hand     Data Reviewed: I have personally reviewed following  labs and imaging studies  CBC: Recent Labs  Lab 10/22/17 0700 10/23/17 0504  WBC 11.8* 6.2  NEUTROABS 9.4*  --   HGB 11.8* 10.8*  HCT 38.0 35.9*  MCV 89.6 90.4  PLT 173 146*   Basic Metabolic Panel: Recent Labs  Lab 10/22/17 0700 10/23/17 0504  NA 138 140  K 3.8 3.5  CL 103 110  CO2 23 22  GLUCOSE 136* 98  BUN 12 13  CREATININE 0.89 0.73  CALCIUM 8.5* 8.1*   GFR: Estimated Creatinine Clearance: 45.9 mL/min (by C-G formula  based on SCr of 0.73 mg/dL). Liver Function Tests: No results for input(s): AST, ALT, ALKPHOS, BILITOT, PROT, ALBUMIN in the last 168 hours. No results for input(s): LIPASE, AMYLASE in the last 168 hours. No results for input(s): AMMONIA in the last 168 hours. Coagulation Profile: No results for input(s): INR, PROTIME in the last 168 hours. Cardiac Enzymes: No results for input(s): CKTOTAL, CKMB, CKMBINDEX, TROPONINI in the last 168 hours. BNP (last 3 results) No results for input(s): PROBNP in the last 8760 hours. HbA1C: No results for input(s): HGBA1C in the last 72 hours. CBG: No results for input(s): GLUCAP in the last 168 hours. Lipid Profile: No results for input(s): CHOL, HDL, LDLCALC, TRIG, CHOLHDL, LDLDIRECT in the last 72 hours. Thyroid Function Tests: No results for input(s): TSH, T4TOTAL, FREET4, T3FREE, THYROIDAB in the last 72 hours. Anemia Panel: No results for input(s): VITAMINB12, FOLATE, FERRITIN, TIBC, IRON, RETICCTPCT in the last 72 hours. Urine analysis:    Component Value Date/Time   COLORURINE YELLOW 10/22/2017 0648   APPEARANCEUR CLEAR 10/22/2017 0648   LABSPEC 1.017 10/22/2017 0648   PHURINE 5.0 10/22/2017 0648   GLUCOSEU NEGATIVE 10/22/2017 0648   HGBUR LARGE (A) 10/22/2017 0648   BILIRUBINUR NEGATIVE 10/22/2017 0648   KETONESUR 5 (A) 10/22/2017 0648   PROTEINUR 30 (A) 10/22/2017 0648   UROBILINOGEN 0.2 06/08/2015 1700   NITRITE NEGATIVE 10/22/2017 0648   LEUKOCYTESUR NEGATIVE 10/22/2017 0648      Recent Results (from the past 240 hour(s))  MRSA PCR Screening     Status: None   Collection Time: 10/22/17  8:45 PM  Result Value Ref Range Status   MRSA by PCR NEGATIVE NEGATIVE Final    Comment:        The GeneXpert MRSA Assay (FDA approved for NASAL specimens only), is one component of a comprehensive MRSA colonization surveillance program. It is not intended to diagnose MRSA infection nor to guide or monitor treatment for MRSA  infections. Performed at [facility], [address] [place], [place] [address]       Anti-infectives (From admission, onward)   Start     Dose/Rate Route Frequency Ordered Stop   10/23/17 0000  levofloxacin (LEVAQUIN) 500 MG tablet     500 mg Oral Daily 10/22/17 0917     10/22/17 1330  ceFEPIme (MAXIPIME) 1 g in sodium chloride 0.9 % 100 mL IVPB     1 g 200 mL/hr over 30 Minutes Intravenous Every 12 hours 10/22/17 1201 10/30/17 0959   10/22/17 0830  levofloxacin (LEVAQUIN) IVPB 500 mg     500 mg 100 mL/hr over 60 Minutes Intravenous  Once 10/22/17 0748 10/22/17 1055       Radiology Studies: Dg Chest 2 View  Result Date: 10/23/2017 CLINICAL DATA:  Pneumonia EXAM: CHEST  2 VIEW COMPARISON:  10/22/2017, 10/20/2012 FINDINGS: Bilateral mild interstitial thickening. No pleural effusion or pneumothorax. No focal consolidation. Stable cardiomegaly. Thoracic aortic  atherosclerosis. Mild osteoarthritis of the left glenohumeral joint. Chronic midthoracic spine vertebral body compression fractures. IMPRESSION: Cardiomegaly with mild pulmonary vascular congestion. Electronically Signed   By: Elige Ko   On: 10/23/2017 09:06        Scheduled Meds: . aspirin  81 mg Oral Daily  . enoxaparin (LOVENOX) injection  40 mg Subcutaneous Q24H  . glycopyrrolate  0.4 mg Intravenous Q6H  . pantoprazole  40 mg Oral Daily  . polyethylene glycol  17 g Oral Daily  . pravastatin  40 mg Oral q1800  . QUEtiapine  25 mg Oral BID   Continuous Infusions: . ceFEPime (MAXIPIME) IV Stopped (10/24/17 1036)     LOS: 2 days    Time spent: 35 min    Joseph Art, DO [facility] Pager [phone]  If 7PM-7AM, please contact night-coverage [email or web address] Password [email or web address] 10/24/2017, 3:30 PM

## 2017-10-24 NOTE — Progress Notes (Signed)
Messaged attending MD re:"Pt has not had BM since admit. Do we want PRN med?"

## 2017-10-24 NOTE — Progress Notes (Signed)
Per Charity at [facility] care, they are able to add hospice services but will need to review patient's discharge paperwork to determine if they can accept her back.  Osborne Casco Ronica Vivian LCSW [phone]

## 2017-10-24 NOTE — Evaluation (Signed)
Physical Therapy Evaluation Patient Details Name: Connie Wagner MRN: [id number] DOB: 1928/12/16 Today's Date: 10/24/2017   History of Present Illness  Connie Wagner is a 84 y.o. female with medical history significant for Alzheimer's dementia, nursing home resident, history of CAD status post MI in 2014, ischemic cardiomyopathy, chronic low back pain, rheumatoid arthritis, GERD, depression, anxiety, anemia, hypertension, hyperlipidemia, recently started on Tamiflu on 10/13/2017 at the facility (per daughter's report, there were cases of flu at the nursing home, for which everybody was placed preventatively with the same med).  Since then, the patient has been experiencing several day history of progressive shortness of breath, with wheezing.   Clinical Impression  Pt poor historian due to dementia however suspect pt was mobile prior to admit due to being at [facility] care unit. Pt now with R foot pain and unable to weight-bear on it requiring max/total assist for OOB mobility/std pvt transfer to [facility]. If pt can receive SNF level of care at [place] pt appropriate to return there however if not pt will need a SNF placement at d/c to maximize functional return.    Follow Up Recommendations SNF;Supervision/Assistance - 24 hour    Equipment Recommendations  None recommended by PT(TBD)    Recommendations for Other Services       Precautions / Restrictions Precautions Precautions: Fall Precaution Comments: confusion/dementia Restrictions Weight Bearing Restrictions: No      Mobility  Bed Mobility Overal bed mobility: Needs Assistance Bed Mobility: Supine to Sit;Sit to Supine;Rolling Rolling: Modified independent (Device/Increase time)   Supine to sit: Min assist Sit to supine: Modified independent (Device/Increase time)   General bed mobility comments: pt able to roll L/R without difficulty, mod verbal and tactile cues to assist pt to EOB. pt able to return self to supine  without physical assist  Transfers Overall transfer level: Needs assistance Equipment used: (1 person lift with gait belt) Transfers: Stand Pivot Transfers   Stand pivot transfers: Max assist;Total assist;+2 safety/equipment       General transfer comment: pt attempted to stand multiple times however c/o R LE pain and was unable to WB on R LE. Pt able to pivot on L LE going to [facility] with max verbal cues and max however unable to complete std pvt back to bed toward the R, required total assist. R LE pain greatly limiting mobility  Ambulation/Gait             General Gait Details: unable  Stairs            Wheelchair Mobility    Modified Rankin (Stroke Patients Only)       Balance Overall balance assessment: Needs assistance Sitting-balance support: Feet supported;Bilateral upper extremity supported Sitting balance-Leahy Scale: Fair     Standing balance support: Bilateral upper extremity supported Standing balance-Leahy Scale: Poor Standing balance comment: pt dependent on physical assist                             Pertinent Vitals/Pain Pain Assessment: Faces Faces Pain Scale: Hurts whole lot Pain Location: R foot, calf Pain Descriptors / Indicators: Grimacing(pt unable to WB on R LE for std pvt transfer to [facility]) Pain Intervention(s): Monitored during session    Home Living Family/patient expects to be discharged to:: Skilled nursing facility                 Additional Comments: pt is a resident at [facility] center  Prior Function Level of Independence: Needs assistance         Comments: unsure of PLOF as pt is a poor historian but suspect pt was functioning at supervision level as pt was in a memory care unit PTA     Hand Dominance        Extremity/Trunk Assessment   Upper Extremity Assessment Upper Extremity Assessment: Generalized weakness    Lower Extremity Assessment Lower Extremity Assessment:  Generalized weakness    Cervical / Trunk Assessment Cervical / Trunk Assessment: Kyphotic  Communication   Communication: No difficulties  Cognition Arousal/Alertness: Awake/alert Behavior During Therapy: Flat affect;Impulsive Overall Cognitive Status: History of cognitive impairments - at baseline                                 General Comments: h/o of alzheimers dementia. pt is a poor historian, unaware she was in the hospital. unable to state where she lives. pt easily irritated when people come into the room and she doesnt know them.       General Comments General comments (skin integrity, edema, etc.): pt dependent for hygiene and tolieting    Exercises     Assessment/Plan    PT Assessment Patient needs continued PT services  PT Problem List Decreased strength;Decreased activity tolerance;Decreased balance;Decreased mobility;Decreased coordination;Decreased cognition;Decreased knowledge of use of DME;Decreased safety awareness;Pain       PT Treatment Interventions DME instruction;Gait training;Functional mobility training;Therapeutic activities;Therapeutic exercise;Balance training    PT Goals (Current goals can be found in the Care Plan section)  Acute Rehab PT Goals Patient Stated Goal: didn't state PT Goal Formulation: Patient unable to participate in goal setting Time For Goal Achievement: 11/07/17 Potential to Achieve Goals: Good    Frequency Min 2X/week   Barriers to discharge        Co-evaluation               AM-PAC PT "6 Clicks" Daily Activity  Outcome Measure Difficulty turning over in bed (including adjusting bedclothes, sheets and blankets)?: Unable Difficulty moving from lying on back to sitting on the side of the bed? : Unable Difficulty sitting down on and standing up from a chair with arms (e.g., wheelchair, bedside commode, etc,.)?: Unable Help needed moving to and from a bed to chair (including a wheelchair)?: Total Help  needed walking in hospital room?: Total Help needed climbing 3-5 steps with a railing? : Total 6 Click Score: 6    End of Session Equipment Utilized During Treatment: Gait belt Activity Tolerance: Patient limited by pain(R foot) Patient left: in bed;with call bell/phone within reach;with bed alarm set(in lowest position with mats on floor) Nurse Communication: Mobility status(R foot pain) PT Visit Diagnosis: Unsteadiness on feet (R26.81);Pain Pain - Right/Left: Right Pain - part of body: Ankle and joints of foot    Time: 1229-1252 PT Time Calculation (min) (ACUTE ONLY): 23 min   Charges:   PT Evaluation $PT Eval Moderate Complexity: 1 Mod PT Treatments $Therapeutic Activity: 8-22 mins   PT G Codes:        Lewis Shock, PT, DPT Pager #: [phone] Office #: [phone]   Theone Bowell M Sakeenah Valcarcel 10/24/2017, 1:34 PM

## 2017-10-24 NOTE — Progress Notes (Signed)
  Speech Language Pathology Treatment: Dysphagia  Patient Details Name: Connie Wagner MRN: [id number] DOB: 1928/11/14 Today's Date: 10/24/2017 Time: 3545-6256 SLP Time Calculation (min) (ACUTE ONLY): 14 min  Assessment / Plan / Recommendation Clinical Impression  Pt's vocal quality is clear at baseline, and remains clear throughout PO trials. SLP provided assistance with self-feeding as pt consumed thin liquids by straw and bites of soft solids. Her oral preparation is mildly prolonged, and she needs Min-Mod cues for use of liquid wash to aid in oral clearance. Pt vocalizes frequently during intake but she has no overt signs of aspiration. Recommend to continue with current diet for now with use of aspiration precautions and assistance with careful feeding. Pt would likely benefit from selecting foods that are soft, moist, but would at least use liquid washes PRN. SLP will continue to follow for tolerance and family education as it remains in line with GOC.   HPI HPI:  84 y.o. female with medical history significant for Alzheimer's dementia, nursing home resident, history of CAD status post MI in 2014, ischemic cardiomyopathy, chronic low back pain, rheumatoid arthritis, GERD, depression, anxiety, anemia, hypertension, hyperlipidemia, recently started on Tamiflu on 10/13/2017 at the facility (per daughter's report, there were cases of flu at the nursing home, for which everybody was placed preventatively with the same med).  Since then, the patient has been experiencing several day history of progressive shortness of breath, with wheezing.  Per daughter's report, the patient had fallen at the facility, for which she was seen at the infirmary, where they noticed the patient having some respiratory distress, despite good O2 sats.       SLP Plan  Continue with current plan of care       Recommendations  Diet recommendations: Regular;Thin liquid Liquids provided via: Cup;Straw Medication  Administration: Whole meds with puree Supervision: Staff to assist with self feeding;Full supervision/cueing for compensatory strategies Compensations: Minimize environmental distractions;Slow rate;Small sips/bites;Follow solids with liquid Postural Changes and/or Swallow Maneuvers: Seated upright 90 degrees                Oral Care Recommendations: Oral care BID Follow up Recommendations: Skilled Nursing facility SLP Visit Diagnosis: Dysphagia, oral phase (R13.11) Plan: Continue with current plan of care       GO                Maxcine Ham 10/24/2017, 4:39 PM  Maxcine Ham, M.A. CCC-SLP [phone]

## 2017-10-25 ENCOUNTER — Inpatient Hospital Stay (HOSPITAL_COMMUNITY): Payer: Medicare HMO

## 2017-10-25 MED ORDER — CEFUROXIME AXETIL 500 MG PO TABS
500.0000 mg | ORAL_TABLET | Freq: Two times a day (BID) | ORAL | Status: DC
  Administered 2017-10-25 – 2017-10-30 (×10): 500 mg via ORAL
  Filled 2017-10-25 (×11): qty 1

## 2017-10-25 MED ORDER — METOPROLOL TARTRATE 12.5 MG HALF TABLET
12.5000 mg | ORAL_TABLET | Freq: Two times a day (BID) | ORAL | Status: DC
  Administered 2017-10-25 – 2017-10-26 (×2): 12.5 mg via ORAL
  Filled 2017-10-25 (×2): qty 1

## 2017-10-25 NOTE — Progress Notes (Signed)
CSW alerted patient's daughter that the [facility] contracted facilities are not able to extend an log and patient may be ready tomorrow or Friday. [facility] [place] is able to accept a 5 day log and will submit authorization. Asking for AD CSW approval.   Cristobal Goldmann LCSW [phone]

## 2017-10-25 NOTE — [place] FL2 (Addendum)
[place] MEDICAID FL2 LEVEL OF CARE SCREENING TOOL     IDENTIFICATION  Patient Name: Connie Wagner Birthdate: 1928/09/05 Sex: female Admission Date (Current Location): 10/22/2017  [facility] and [place] Number:  Producer, television/film/video and Address:  [facility] [place], [address], [place], [place] [address]      Provider Number: [id number]  Attending Physician Name and Address:  Joseph Art, DO  Relative Name and Phone Number:  Misty Stanley, daughter, [phone]     Current Level of Care: Hospital Recommended Level of Care: Memory Care Prior Approval Number:    Date Approved/Denied:   PASRR Number: [id number]  Discharge Plan: Memory Care    Current Diagnoses: Patient Active Problem List   Diagnosis Date Noted  . Wheezing   . Weakness   . Palliative care by specialist   . Goals of care, counseling/discussion   . Pneumonia 10/22/2017  . Shortness of breath 10/22/2017  . Carotid stenosis, bilateral 07/05/2017  . Cerebral embolism with cerebral infarction 07/04/2017  . Stroke (cerebrum) (HCC) 07/04/2017  . Physical deconditioning 10/23/2012  . Cardiogenic shock (HCC) 10/23/2012  . Wenckebach second degree AV block 10/20/2012  . Complete heart block (HCC) 10/19/2012  . Acute renal failure (HCC) 10/19/2012  . Acute lower UTI 10/19/2012  . Acute systolic CHF (congestive heart failure) (HCC) 10/19/2012  . Normocytic anemia 10/19/2012  . S/P drug eluting coronary stent placement 10/19/2012  . Coronary artery disease 10/19/2012  . Acute MI, inferior wall, initial episode of care (HCC) 10/18/2012  . Urinary tract infection, E. coli 08/21/2012  . Dementia   . Depression   . Prepyloric ulcer 09/15/2011  . Hypotension due to blood loss 09/15/2011  . Leukocytosis 09/15/2011  . Extrarenal azotemia 09/15/2011  . GI bleeding 09/14/2011  . Anemia associated with acute blood loss 09/14/2011  . Tachycardia 09/14/2011  . HTN (hypertension) 09/14/2011  .  Anxiety 09/14/2011  . ETOH abuse 09/14/2011  . 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 09/14/2011    Orientation RESPIRATION BLADDER Height & Weight     (Disoriented x4)  Normal Continent Weight: 71.6 kg (157 lb 13.6 oz) Height:     BEHAVIORAL SYMPTOMS/MOOD NEUROLOGICAL BOWEL NUTRITION STATUS      Continent Diet (regular) no added salt  AMBULATORY STATUS COMMUNICATION OF NEEDS Skin   Limited Assist Verbally Normal                       Personal Care Assistance Level of Assistance  Bathing, Feeding, Dressing Bathing Assistance: Maximum assistance Feeding assistance: Limited assistance Dressing Assistance: Limited assistance     Functional Limitations Info  Sight, Hearing, Speech Sight Info: Adequate Hearing Info: Adequate Speech Info: Adequate    SPECIAL CARE FACTORS FREQUENCY                       Contractures      Additional Factors Info  Code Status, Allergies, Psychotropic Code Status Info: DNR Allergies Info: Augmentin Amoxicillin-pot Clavulanate, Codeine, Cozaar Losartan Potassium, Evista Raloxifene Hcl, Flagyl Metronidazole, Iron, Lexapro Escitalopram Oxalate, Tetracyclines & Related, Penicillins, Sulfa Drugs Cross Reactors Psychotropic Info: Seroquel         Current Medications (10/25/2017):  This is the current hospital active medication list Current Facility-Administered Medications  Medication Dose Route Frequency Provider Last Rate Last Dose  . antiseptic oral rinse (BIOTENE) solution 15 mL  15 mL Topical PRN Wertman,  Sung Amabile, PA-C      . aspirin chewable tablet 81 mg  81 mg Oral Daily Marcos Eke, PA-C   81 mg at 10/25/17 1001  . ceFEPIme (MAXIPIME) 1 g in sodium chloride 0.9 % 100 mL IVPB  1 g Intravenous Q12H Marcos Eke, PA-C   Stopped at 10/25/17 1145  . enoxaparin (LOVENOX) injection 40 mg  40 mg Subcutaneous Q24H Marcos Eke, PA-C   40 mg at 10/25/17 0750  .  glycopyrrolate (ROBINUL) injection 0.4 mg  0.4 mg Intravenous Q6H Golding, Elizabeth L, DO   0.4 mg at 10/25/17 1151  . guaiFENesin (MUCINEX) 12 hr tablet 600 mg  600 mg Oral BID PRN Marcos Eke, PA-C   600 mg at 10/24/17 2015  . hydrALAZINE (APRESOLINE) injection 5 mg  5 mg Intravenous Q8H PRN Marcos Eke, PA-C      . levalbuterol (XOPENEX) nebulizer solution 0.63 mg  0.63 mg Nebulization Q6H PRN Marcos Eke, PA-C   0.63 mg at 10/22/17 1744  . pantoprazole (PROTONIX) EC tablet 40 mg  40 mg Oral Daily Marcos Eke, PA-C   40 mg at 10/25/17 1001  . polyethylene glycol (MIRALAX / GLYCOLAX) packet 17 g  17 g Oral Daily Vann, Jessica U, DO   17 g at 10/25/17 1001  . polyvinyl alcohol (LIQUIFILM TEARS) 1.4 % ophthalmic solution 1 drop  1 drop Both Eyes QID PRN Marcos Eke, PA-C      . pravastatin (PRAVACHOL) tablet 40 mg  40 mg Oral q1800 Marcos Eke, PA-C   40 mg at 10/24/17 1717  . QUEtiapine (SEROQUEL) tablet 25 mg  25 mg Oral BID Marcos Eke, PA-C   25 mg at 10/25/17 1001   Facility-Administered Medications Ordered in Other Encounters  Medication Dose Route Frequency Provider Last Rate Last Dose  . bupivacaine (MARCAINE) 0.5 % injection    PRN Sheldon Silvan, MD   20 mL at 08/17/12 1922     Discharge Medications: acetaminophen 325 MG tablet Commonly known as:  TYLENOL Take 2 tablets (650 mg total) every 6 (six) hours as needed by mouth for mild pain or fever.   aspirin 81 MG EC tablet Take 1 tablet (81 mg total) daily by mouth.   benzonatate 200 MG capsule Commonly known as:  TESSALON Take 1 capsule (200 mg total) by mouth 3 (three) times daily as needed for cough.   cefUROXime 500 MG tablet Commonly known as:  CEFTIN Take 1 tablet (500 mg total) by mouth 2 (two) times daily with a meal. Foe 2 more days from 3/2 and then stop   diclofenac sodium 1 % Gel Commonly known as:  VOLTAREN Apply 2 g topically 4 (four) times daily.   guaiFENesin 600 MG 12 hr  tablet Commonly known as:  MUCINEX Take 1 tablet (600 mg total) by mouth 2 (two) times daily.   metoprolol tartrate 25 MG tablet Commonly known as:  LOPRESSOR Take 1.5 tablets (37.5 mg total) by mouth 2 (two) times daily.   pantoprazole 40 MG tablet Commonly known as:  PROTONIX Take 1 tablet (40 mg total) daily by mouth.   polyethylene glycol packet Commonly known as:  MIRALAX / GLYCOLAX Take 17 g by mouth daily.   pravastatin 40 MG tablet Commonly known as:  PRAVACHOL Take 1 tablet (40 mg total) daily at 6 PM by mouth.   QUEtiapine 25 MG tablet Commonly known as:  SEROQUEL Take 1 tablet (25 mg total)  2 (two) times daily by mouth.           Contact information for follow-up providers        Stoneking, Ann Maki, MD. Schedule an appointment as soon as possible for a visit in 1 week(s).   Specialty:  Internal Medicine Contact information: [address] [place] [address] [phone]                 Contact information for after-discharge care            Destination    [facility] Follow up.   Service:  Skilled Nursing Contact information: [address] [place] [address] [phone]                        Allergies  Allergen Reactions  . Augmentin [Amoxicillin-Pot Clavulanate] Other (See Comments)    On MAR   . Codeine Other (See Comments)    On MAR  . Cozaar [Losartan Potassium] Other (See Comments)    On MAR  . Evista [Raloxifene Hcl] Other (See Comments)    On MAR  . Flagyl [Metronidazole] Other (See Comments)    On MAR  . Iron Nausea And Vomiting    Loose stool  . Lexapro [Escitalopram Oxalate] Other (See Comments)    On MAR  . Tetracyclines & Related Other (See Comments)    On MAR  . Penicillins Rash    Has patient had a PCN reaction causing immediate rash, facial/tongue/throat swelling, SOB or lightheadedness with hypotension: No  Has patient had  a PCN reaction causing severe rash involving mucus membranes or skin necrosis: No Has patient had a PCN reaction that required hospitalization No Has patient had a PCN reaction occurring within the last 10 years: No If all of the above answers are "NO", then may proceed with Cephalosporin use.   Gaetana Michael[facility] Drugs Cross Reactors Rash    Consultations:   None  Other Procedures/Studies: ImagingResults   Dg Chest 2 View  Result Date: 10/23/2017 CLINICAL DATA:  Pneumonia EXAM: CHEST  2 VIEW COMPARISON:  10/22/2017, 10/20/2012 FINDINGS: Bilateral mild interstitial thickening. No pleural effusion or pneumothorax. No focal consolidation. Stable cardiomegaly. Thoracic aortic atherosclerosis. Mild osteoarthritis of the left glenohumeral joint. Chronic midthoracic spine vertebral body compression fractures. IMPRESSION: Cardiomegaly with mild pulmonary vascular congestion. Electronically Signed   By: Elige Ko   On: 10/23/2017 09:06   Dg Chest 2 View  Result Date: 10/22/2017 CLINICAL DATA:  Dyspnea and fever EXAM: CHEST  2 VIEW COMPARISON:  11/17/2014 chest radiograph. FINDINGS: Stable cardiomediastinal silhouette with mild cardiomegaly. No pneumothorax. No pleural effusion. Mild patchy right lower parahilar lung opacity. Multiple mid to lower thoracic and upper lumbar vertebral compression deformities appear chronic and stable. IMPRESSION: Mild patchy right lower parahilar lung opacity, which could represent atelectasis, aspiration or pneumonia. Recommend short-term follow-up PA and lateral chest radiographs. Mild cardiomegaly. Electronically Signed   By: Delbert Phenix M.D.   On: 10/22/2017 07:24   Dg Foot Complete Left  Result Date: 10/25/2017 CLINICAL DATA:  Left leg pain, altered mental status. EXAM: LEFT FOOT - COMPLETE 3+ VIEW COMPARISON:  None. FINDINGS: No acute osseous or joint abnormality.  Calcaneal spurs. IMPRESSION: No acute findings. Electronically Signed   By: Leanna Battles M.D.    On: 10/25/2017 14:08       TODAY-DAY OF DISCHARGE:  Subjective:   Connie Wagner today had a uneventful night-remains calm and  quiet.  Sleeping comfortably when I walked in.  Follow simple commands.  Otherwise she is pleasantly confused.  Objective:   Blood pressure 105/61, pulse 82, temperature 98.2 F (36.8 C), temperature source Axillary, resp. rate 16, weight 73.9 kg (163 lb), SpO2 100 %.  Intake/Output Summary (Last 24 hours) at 10/28/2017 0956 Last data filed at 10/27/2017 1900    Gross per 24 hour  Intake 486.88 ml  Output -  Net 486.88 ml        Filed Weights   10/23/17 0513 10/27/17 0500 10/28/17 0500  Weight: 71.6 kg (157 lb 13.6 oz) 70.9 kg (156 lb 4.8 oz) 73.9 kg (163 lb)    Exam: Awake but pleasantly confused, No new F.N deficits, Normal affect [place].AT,PERRAL Supple Neck,No JVD, No cervical lymphadenopathy appriciated.  Symmetrical Chest wall movement, Good air movement bilaterally, CTAB RRR,No Gallops,Rubs or new Murmurs, No Parasternal Heave +ve B.Sounds, Abd Soft, Non tender, No organomegaly appriciated, No rebound -guarding or rigidity. No Cyanosis, Clubbing or edema, No new Rash or bruise   PERTINENT RADIOLOGIC STUDIES:  ImagingResults  Dg Chest 2 View  Result Date: 10/23/2017 CLINICAL DATA:  Pneumonia EXAM: CHEST  2 VIEW COMPARISON:  10/22/2017, 10/20/2012 FINDINGS: Bilateral mild interstitial thickening. No pleural effusion or pneumothorax. No focal consolidation. Stable cardiomegaly. Thoracic aortic atherosclerosis. Mild osteoarthritis of the left glenohumeral joint. Chronic midthoracic spine vertebral body compression fractures. IMPRESSION: Cardiomegaly with mild pulmonary vascular congestion. Electronically Signed   By: Elige Ko   On: 10/23/2017 09:06   Dg Chest 2 View  Result Date: 10/22/2017 CLINICAL DATA:  Dyspnea and fever EXAM: CHEST  2 VIEW COMPARISON:  11/17/2014 chest radiograph. FINDINGS: Stable cardiomediastinal  silhouette with mild cardiomegaly. No pneumothorax. No pleural effusion. Mild patchy right lower parahilar lung opacity. Multiple mid to lower thoracic and upper lumbar vertebral compression deformities appear chronic and stable. IMPRESSION: Mild patchy right lower parahilar lung opacity, which could represent atelectasis, aspiration or pneumonia. Recommend short-term follow-up PA and lateral chest radiographs. Mild cardiomegaly. Electronically Signed   By: Delbert Phenix M.D.   On: 10/22/2017 07:24   Dg Foot Complete Left  Result Date: 10/25/2017 CLINICAL DATA:  Left leg pain, altered mental status. EXAM: LEFT FOOT - COMPLETE 3+ VIEW COMPARISON:  None. FINDINGS: No acute osseous or joint abnormality.  Calcaneal spurs. IMPRESSION: No acute findings. Electronically Signed   By: Leanna Battles M.D.   On: 10/25/2017 14:08      PERTINENT LAB RESULTS: CBC: RecentLabs(last2labs)  Recent Labs    10/27/17 0741  WBC 8.4  HGB 10.3*  HCT 33.2*  PLT 194     CMET CMP     Labs(Brief)          Component Value Date/Time   NA 140 10/28/2017 0331   K 4.0 10/28/2017 0331   CL 105 10/28/2017 0331   CO2 24 10/28/2017 0331   GLUCOSE 114 (H) 10/28/2017 0331   BUN 16 10/28/2017 0331   CREATININE 0.91 10/28/2017 0331   CALCIUM 8.8 (L) 10/28/2017 0331   PROT 7.0 07/04/2017 0423   ALBUMIN 3.5 07/04/2017 0423   AST 16 07/04/2017 0423   ALT 12 (L) 07/04/2017 0423   ALKPHOS 68 07/04/2017 0423   BILITOT 0.7 07/04/2017 0423   GFRNONAA 55 (L) 10/28/2017 0331   GFRAA >60 10/28/2017 0331      GFR Estimated Creatinine Clearance: 41 mL/min (by C-G formula based on SCr of 0.91 mg/dL). RecentLabs(last2labs)  No results for input(s): LIPASE, AMYLASE in the last  72 hours.   RecentLabs(last2labs)  No results for input(s): CKTOTAL, CKMB, CKMBINDEX, TROPONINI in the last 72 hours.   RecentLabs(last2labs)  Invalid input(s): POCBNP   RecentLabs(last2labs)   No results for input(s): DDIMER in the last 72 hours.   RecentLabs(last2labs)  No results for input(s): HGBA1C in the last 72 hours.   RecentLabs(last2labs)  No results for input(s): CHOL, HDL, LDLCALC, TRIG, CHOLHDL, LDLDIRECT in the last 72 hours.    RecentLabs(last2labs)  No results for input(s): TSH, T4TOTAL, T3FREE, THYROIDAB in the last 72 hours.  Invalid input(s): FREET3   RecentLabs(last2labs)  No results for input(s): VITAMINB12, FOLATE, FERRITIN, TIBC, IRON, RETICCTPCT in the last 72 hours.   Coags:  RecentLabs(last2labs)  No results for input(s): INR in the last 72 hours.  Invalid input(s): PT   Microbiology: Recent Results (from the past 240 hour(s))  MRSA PCR Screening     Status: None   Collection Time: 10/22/17  8:45 PM  Result Value Ref Range Status   MRSA by PCR NEGATIVE NEGATIVE Final    Comment:        The GeneXpert MRSA Assay (FDA approved for NASAL specimens only), is one component of a comprehensive MRSA colonization surveillance program. It is not intended to diagnose MRSA infection nor to guide or monitor treatment for MRSA infections. Performed at [facility], [address], [place], [place] [address]     FURTHER DISCHARGE INSTRUCTIONS:  Get Medicines reviewed and adjusted: Please take all your medications with you for your next visit with your Primary MD  Laboratory/radiological data: Please request your Primary MD to go over all hospital tests and procedure/radiological results at the follow up, please ask your Primary MD to get all Hospital records sent to his/her office.  In some cases, they will be blood work, cultures and biopsy results pending at the time of your discharge. Please request that your primary care M.D. goes through all the records of your hospital data and follows up on these results.  Also Note the following: If you experience worsening of your admission symptoms,  develop shortness of breath, life threatening emergency, suicidal or homicidal thoughts you must seek medical attention immediately by calling 911 or calling your MD immediately  if symptoms less severe.  You must read complete instructions/literature along with all the possible adverse reactions/side effects for all the Medicines you take and that have been prescribed to you. Take any new Medicines after you have completely understood and accpet all the possible adverse reactions/side effects.   Do not drive when taking Pain medications or sleeping medications (Benzodaizepines)  Do not take more than prescribed Pain, Sleep and Anxiety Medications. It is not advisable to combine anxiety,sleep and pain medications without talking with your primary care practitioner  Special Instructions: If you have smoked or chewed Tobacco  in the last 2 yrs please stop smoking, stop any regular Alcohol  and or any Recreational drug use.  Wear Seat belts while driving.  Please note: You were cared for by a hospitalist during your hospital stay. Once you are discharged, your primary care physician will handle any further medical issues. Please note that NO REFILLS for any discharge medications will be authorized once you are discharged, as it is imperative that you return to your primary care physician (or establish a relationship with a primary care physician if you do not have one) for your post hospital discharge needs so that they can reassess your need for medications and monitor your lab values.  Total Time spent coordinating discharge including counseling, education and face to face time equals  45 minutes.  Signed: Jeoffrey Massed 10/28/2017 9:56 AM           Routing History               Please see discharge summary for a list of discharge medications.  Relevant Imaging Results:  Relevant Lab Results:   Additional Information SS#: [id number]          Add Palliative  services  Mearl Latin, LCSWA

## 2017-10-25 NOTE — Progress Notes (Signed)
Daily Progress Note   Patient Name: Connie Wagner       Date: 10/25/2017 DOB: March 07, 1929  Age: 84 y.o. MRN#: [id number] Attending Physician: Joseph Art, DO Primary Care Physician: Merlene Laughter, MD Admit Date: 10/22/2017  Reason for Consultation/Follow-up: Establishing goals of care  Subjective: Patient awake, alert, pleasantly confused. Denies pain. Ate bites of breakfast.   GOC:  Daughter, Connie Wagner at bedside. Again discussed events leading up to hospitalization and hospital diagnoses and interventions. Educated on disease trajectory of dementia. Connie Wagner believes her mother "looks better" today and she has been asking to get out of bed. Connie Wagner is hopeful she will gain some strength by working with therapy. Again discussed discharge options--SNF for rehab versus back to memory care with hospice. After discussion, Connie Wagner requesting SNF for rehab. If she does not progress at SNF, Connie Wagner is interested in hospice services back at memory care. I encouraged her to contact PCP for outpatient palliative care referral.   Answered questions and concerns.   Length of Stay: 3  Current Medications: Scheduled Meds:  . aspirin  81 mg Oral Daily  . enoxaparin (LOVENOX) injection  40 mg Subcutaneous Q24H  . glycopyrrolate  0.4 mg Intravenous Q6H  . pantoprazole  40 mg Oral Daily  . polyethylene glycol  17 g Oral Daily  . pravastatin  40 mg Oral q1800  . QUEtiapine  25 mg Oral BID    Continuous Infusions: . ceFEPime (MAXIPIME) IV Stopped (10/24/17 2313)    PRN Meds: antiseptic oral rinse, guaiFENesin, hydrALAZINE, levalbuterol, polyvinyl alcohol  Physical Exam  Constitutional: She is cooperative.  HENT:  Head: Normocephalic and atraumatic.  Cardiovascular: Regular rhythm.  Pulmonary/Chest: No  accessory muscle usage. No tachypnea. No respiratory distress.  Neurological: She is alert.  Baseline dementia. Pleasantly confused.  Skin: Skin is warm and dry. There is pallor.  Psychiatric: Her speech is delayed. Cognition and memory are impaired. She is inattentive.  Nursing note and vitals reviewed.          Vital Signs: BP 139/76 (BP Location: Right Arm)   Pulse (!) 101   Temp 97.7 F (36.5 C) (Oral)   Resp 19   Wt 71.6 kg (157 lb 13.6 oz)   SpO2 95%   BMI 28.87 kg/m  SpO2: SpO2: 95 % O2 Device: O2 Device: Room  Air O2 Flow Rate: O2 Flow Rate (L/min): 2 L/min  Intake/output summary:   Intake/Output Summary (Last 24 hours) at 10/25/2017 6144 Last data filed at 10/25/2017 0516 Gross per 24 hour  Intake 420 ml  Output -  Net 420 ml   LBM:   Baseline Weight: Weight: 71.6 kg (157 lb 13.6 oz) Most recent weight: Weight: 71.6 kg (157 lb 13.6 oz)       Palliative Assessment/Data:  PPS 40%   Flowsheet Rows     Most Recent Value  Intake Tab  Referral Department  Hospitalist  Unit at Time of Referral  Med/Surg Unit  Palliative Care Primary Diagnosis  Sepsis/Infectious Disease  Palliative Care Type  New Palliative care  Reason for referral  Clarify Goals of Care  Date first seen by Palliative Care  10/23/17  Clinical Assessment  Palliative Performance Scale Score  40%  Psychosocial & Spiritual Assessment  Palliative Care Outcomes  Patient/Family meeting held?  Yes  Who was at the meeting?  patient and daughter  Palliative Care Outcomes  Clarified goals of care, Provided end of life care assistance, Provided psychosocial or spiritual support, ACP counseling assistance, Counseled regarding hospice      Patient Active Problem List   Diagnosis Date Noted  . Wheezing   . Weakness   . Palliative care by specialist   . Goals of care, counseling/discussion   . Pneumonia 10/22/2017  . Shortness of breath 10/22/2017  . Carotid stenosis, bilateral 07/05/2017  . Cerebral  embolism with cerebral infarction 07/04/2017  . Stroke (cerebrum) (HCC) 07/04/2017  . Physical deconditioning 10/23/2012  . Cardiogenic shock (HCC) 10/23/2012  . Wenckebach second degree AV block 10/20/2012  . Complete heart block (HCC) 10/19/2012  . Acute renal failure (HCC) 10/19/2012  . Acute lower UTI 10/19/2012  . Acute systolic CHF (congestive heart failure) (HCC) 10/19/2012  . Normocytic anemia 10/19/2012  . S/P drug eluting coronary stent placement 10/19/2012  . Coronary artery disease 10/19/2012  . Acute MI, inferior wall, initial episode of care (HCC) 10/18/2012  . Urinary tract infection, E. coli 08/21/2012  . Dementia   . Depression   . Prepyloric ulcer 09/15/2011  . Hypotension due to blood loss 09/15/2011  . Leukocytosis 09/15/2011  . Extrarenal azotemia 09/15/2011  . GI bleeding 09/14/2011  . Anemia associated with acute blood loss 09/14/2011  . Tachycardia 09/14/2011  . HTN (hypertension) 09/14/2011  . Anxiety 09/14/2011  . ETOH abuse 09/14/2011  . 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 09/14/2011    Palliative Care Assessment & Plan   Patient Profile: 84 y.o. female  with past medical history of dementia, ischemic cardiomyopathy, hypertension, MI 2014, CAD, anemia, anxiety, depression, upper GI bleed, rheumatoid arthritis, migraines admitted on 10/22/2017 with shortness of breath, cough, and fall. Patient started on Tamiflu on 10/13/17 due to flu cases within the nursing facility. Since then, patient experiencing shortness of breath and wheezing. The day of admission, patient experienced a fall with respiratory distress. In ED, flu negative. Chest xray revealed mild patchy right lower parahilar lung opacity--atelectasis, aspiration, or pneumonia. Started on IV antibiotics. Palliative medicine consultation for goals of care.    Assessment: Pneumonia Dementia Falls Dyspnea Hypertension Hyperlipidemia Hx of ICM/CAD  Recommendations/Plan:  DNR/DNI. Continue medical management.   Daughter requesting short-term SNF for rehab. If she does not progress at rehab, daughter will likely transition to hospice services at memory care unit. SW consult placed. May benefit from outpatient palliative  f/u.   Encouraged daughter to contact PCP for outpatient palliative referral.    Code Status: DNR   Code Status Orders  (From admission, onward)        Start     Ordered   10/22/17 1817  Do not attempt resuscitation (DNR)  Continuous    Question Answer Comment  In the event of cardiac or respiratory ARREST Do not call a "code blue"   In the event of cardiac or respiratory ARREST Do not perform Intubation, CPR, defibrillation or ACLS   In the event of cardiac or respiratory ARREST Use medication by any route, position, wound care, and other measures to relive pain and suffering. May use oxygen, suction and manual treatment of airway obstruction as needed for comfort.   Comments Discussed with patient's daughter. She is DNR but can be intubated if medically indicated.      10/22/17 1816    Code Status History    Date Active Date Inactive Code Status Order ID Comments User Context   10/22/2017 12:01 10/22/2017 18:16 DNR [id number]  Marcos Eke, PA-C ED   07/04/2017 06:18 07/06/2017 18:57 Partial Code [id number]  Caryl Pina, MD ED   07/04/2017 06:12 07/04/2017 06:18 Full Code [id number]  Caryl Pina, MD ED   10/18/2012 13:26 10/23/2012 20:03 DNR [id number]  Tonny Bollman, MD Inpatient   09/14/2011 17:35 09/18/2011 16:34 Full Code [id number]  Kathalene Frames, RN Inpatient       Prognosis:   Unable to determine  Discharge Planning:  Skilled Nursing Facility for rehab with Palliative care service follow-up  Care plan was discussed with patient, daughter, Dr Benjamine Mola  Thank you for allowing the Palliative Medicine  Team to assist in the care of this patient.   Time In: 0900 Time Out: 0920 Total Time Prolonged Time Billed  no      Greater than 50%  of this time was spent counseling and coordinating care related to the above assessment and plan.  Vennie Homans, FNP-C Palliative Medicine Team  Phone: [phone] Fax: [phone]  Please contact Palliative Medicine Team phone at [phone] for questions and concerns.

## 2017-10-25 NOTE — Progress Notes (Addendum)
PROGRESS NOTE    Connie Wagner  HXT:[id number] DOB: 1929/05/09 DOA: 10/22/2017 PCP: Merlene Laughter, MD   Outpatient Specialists:     Brief Narrative:  Connie Wagner is a 84 y.o. female with medical history significant for Alzheimer's dementia, nursing home resident, history of CAD status post MI in 2014, ischemic cardiomyopathy, chronic low back pain, rheumatoid arthritis, GERD, depression, anxiety, anemia, hypertension, hyperlipidemia, recently started on Tamiflu on 10/13/2017 at the facility (per daughter's report, there were cases of flu at the nursing home, for which everybody was placed preventatively with the same med).  Since then, the patient has been experiencing several day history of progressive shortness of breath, with wheezing.  Per daughter's report, the patient had fallen at the facility, for which she was seen at the infirmary, where they noticed the patient having some respiratory distress, despite good O2 sats.  She had a T-max of 99.4.  She was sent to the ED for further evaluation and management of her symptoms.     Assessment & Plan:   Principal Problem:   Pneumonia Active Problems:   Tachycardia   HTN (hypertension)   Anxiety   Dementia   Depression   Normocytic anemia   Coronary artery disease   Stroke (cerebrum) (HCC)   Carotid stenosis, bilateral   Shortness of breath   Wheezing   Weakness   Palliative care by specialist   Goals of care, counseling/discussion   Progressive shortness of breath and cough in the setting of Pneumonia  - Influenza by PCR was negative  -IV antibiotics with Cefepime and Vanco-- change to PO and monitor overnight -Strep pneumo urine antigen negative -Nebulizers as needed, with Xopenex q 6 prn  -Mucinex prn   Hypertension -monitor  Hyperlipidemia Continue home statins  History of ICM/CAD, last 2D echo  in November 2018 shows grade 1 diastolic dysfunction, EF 60-65%, normal systolic function in the left  ventricle. Sinus tachycardia with PVCs, no significant changes from prior  Mechanical fall with bruising of the R little finger. Offered XR to r/o fracture, patient and daughter declined  -patient moves fingers well  History of Dementia Continue Seroquel for sleep   GERD, no acute symptoms Continue PPI  Left foot pain -x ray negative -pain appears more like a sprain  ? A fib -add low dose BB -h/o GI bleed in past-- not a candidate for NOAC/coumadin -continue ASA  Poor overall prognosis  DVT prophylaxis:  Lovenox   Code Status: DNR   Family Communication Called daughter  Disposition Plan:  Palliative care to follow at SNF   Consultants:  Palliative care  Subjective: Pain in left foot with palpation  Objective: Vitals:   10/24/17 1424 10/24/17 2102 10/25/17 0603 10/25/17 1513  BP: 125/75 (!) 154/97 139/76 (!) 142/82  Pulse: (!) 117 (!) 107 (!) 101 100  Resp: 20 (!) 32 19 20  Temp: 98.9 F (37.2 C) 99.5 F (37.5 C) 97.7 F (36.5 C) 98.5 F (36.9 C)  TempSrc:  Oral Oral Oral  SpO2: 95% 93% 95% 95%  Weight:        Intake/Output Summary (Last 24 hours) at 10/25/2017 1545 Last data filed at 10/25/2017 1514 Gross per 24 hour  Intake 538 ml  Output -  Net 538 ml   Filed Weights   10/23/17 0513  Weight: 71.6 kg (157 lb 13.6 oz)    Examination:  General exam: awakens easily Respiratory system: no increase work of breathing Cardiovascular system: slightly tachycaric Gastrointestinal system: +BS,  soft Appropriate coversation, no hallucinations No point tenderness in left foot but pain with palpation     Data Reviewed: I have personally reviewed following labs and imaging studies  CBC: Recent Labs  Lab 10/22/17 0700 10/23/17 0504  WBC 11.8* 6.2  NEUTROABS 9.4*  --   HGB 11.8* 10.8*  HCT 38.0 35.9*  MCV 89.6 90.4  PLT 173 146*   Basic Metabolic Panel: Recent Labs  Lab 10/22/17 0700 10/23/17 0504  NA 138 140  K 3.8 3.5  CL 103  110  CO2 23 22  GLUCOSE 136* 98  BUN 12 13  CREATININE 0.89 0.73  CALCIUM 8.5* 8.1*   GFR: Estimated Creatinine Clearance: 45.9 mL/min (by C-G formula based on SCr of 0.73 mg/dL). Liver Function Tests: No results for input(s): AST, ALT, ALKPHOS, BILITOT, PROT, ALBUMIN in the last 168 hours. No results for input(s): LIPASE, AMYLASE in the last 168 hours. No results for input(s): AMMONIA in the last 168 hours. Coagulation Profile: No results for input(s): INR, PROTIME in the last 168 hours. Cardiac Enzymes: No results for input(s): CKTOTAL, CKMB, CKMBINDEX, TROPONINI in the last 168 hours. BNP (last 3 results) No results for input(s): PROBNP in the last 8760 hours. HbA1C: No results for input(s): HGBA1C in the last 72 hours. CBG: No results for input(s): GLUCAP in the last 168 hours. Lipid Profile: No results for input(s): CHOL, HDL, LDLCALC, TRIG, CHOLHDL, LDLDIRECT in the last 72 hours. Thyroid Function Tests: No results for input(s): TSH, T4TOTAL, FREET4, T3FREE, THYROIDAB in the last 72 hours. Anemia Panel: No results for input(s): VITAMINB12, FOLATE, FERRITIN, TIBC, IRON, RETICCTPCT in the last 72 hours. Urine analysis:    Component Value Date/Time   COLORURINE YELLOW 10/22/2017 0648   APPEARANCEUR CLEAR 10/22/2017 0648   LABSPEC 1.017 10/22/2017 0648   PHURINE 5.0 10/22/2017 0648   GLUCOSEU NEGATIVE 10/22/2017 0648   HGBUR LARGE (A) 10/22/2017 0648   BILIRUBINUR NEGATIVE 10/22/2017 0648   KETONESUR 5 (A) 10/22/2017 0648   PROTEINUR 30 (A) 10/22/2017 0648   UROBILINOGEN 0.2 06/08/2015 1700   NITRITE NEGATIVE 10/22/2017 0648   LEUKOCYTESUR NEGATIVE 10/22/2017 0648      Recent Results (from the past 240 hour(s))  MRSA PCR Screening     Status: None   Collection Time: 10/22/17  8:45 PM  Result Value Ref Range Status   MRSA by PCR NEGATIVE NEGATIVE Final    Comment:        The GeneXpert MRSA Assay (FDA approved for NASAL specimens only), is one component of  a comprehensive MRSA colonization surveillance program. It is not intended to diagnose MRSA infection nor to guide or monitor treatment for MRSA infections. Performed at [facility], [address] [place], [place] [address]       Anti-infectives (From admission, onward)   Start     Dose/Rate Route Frequency Ordered Stop   10/23/17 0000  levofloxacin (LEVAQUIN) 500 MG tablet     500 mg Oral Daily 10/22/17 0917     10/22/17 1330  ceFEPIme (MAXIPIME) 1 g in sodium chloride 0.9 % 100 mL IVPB     1 g 200 mL/hr over 30 Minutes Intravenous Every 12 hours 10/22/17 1201 10/30/17 0959   10/22/17 0830  levofloxacin (LEVAQUIN) IVPB 500 mg     500 mg 100 mL/hr over 60 Minutes Intravenous  Once 10/22/17 0748 10/22/17 1055       Radiology Studies: Dg Foot Complete Left  Result Date: 10/25/2017 CLINICAL DATA:  Left  leg pain, altered mental status. EXAM: LEFT FOOT - COMPLETE 3+ VIEW COMPARISON:  None. FINDINGS: No acute osseous or joint abnormality.  Calcaneal spurs. IMPRESSION: No acute findings. Electronically Signed   By: Leanna Battles M.D.   On: 10/25/2017 14:08        Scheduled Meds: . aspirin  81 mg Oral Daily  . enoxaparin (LOVENOX) injection  40 mg Subcutaneous Q24H  . glycopyrrolate  0.4 mg Intravenous Q6H  . pantoprazole  40 mg Oral Daily  . polyethylene glycol  17 g Oral Daily  . pravastatin  40 mg Oral q1800  . QUEtiapine  25 mg Oral BID   Continuous Infusions: . ceFEPime (MAXIPIME) IV Stopped (10/25/17 1145)     LOS: 3 days    Time spent: 25 min    Joseph Art, DO [facility] Pager [phone]  If 7PM-7AM, please contact night-coverage [email or web address] Password [facility] 10/25/2017, 3:45 PM

## 2017-10-25 NOTE — Clinical Social Work Note (Signed)
Clinical Social Work Assessment  Patient Details  Name: Connie Wagner MRN: [id number] Date of Birth: May 03, 1929  Date of referral:  10/25/17               Reason for consult:  Facility Placement                Permission sought to share information with:  Facility Medical sales representative, Family Supports Permission granted to share information::  Yes, Verbal Permission Granted  Name::     IT consultant::  SNFs  Relationship::  Daughter  Contact Information:  [phone]  Housing/Transportation Living arrangements for the past 2 months:  Assisted Living Facility Source of Information:  Adult Children Patient Interpreter Needed:  None Criminal Activity/Legal Involvement Pertinent to Current Situation/Hospitalization:  No - Comment as needed Significant Relationships:  Adult Children Lives with:  Adult Children Do you feel safe going back to the place where you live?  No Need for family participation in patient care:  Yes (Comment)  Care giving concerns:  CSW received consult for possible SNF placement at time of discharge. CSW spoke with patient's daughter regarding PT recommendation of SNF placement at time of discharge. Patient reported that she is hopeful patient can bounce back to baseline and if rehab doesn't help, she will add hospice at memory care. CSW to continue to follow and assist with discharge planning needs.   Social Worker assessment / plan:  CSW spoke with patient's daughter concerning possibility of rehab at [facility] before returning home.  Employment status:  Retired Database administrator PT Recommendations:  Skilled Nursing Facility Information / Referral to community resources:  Skilled Nursing Facility  Patient/Family's Response to care: Patient's daughter recognizes need for rehab before returning home and is agreeable to a SNF in [place]. Patient's daughter recognizes that insurance requires authorization.   Patient/Family's  Understanding of and Emotional Response to Diagnosis, Current Treatment, and Prognosis:  Patient/family is realistic regarding therapy needs and expressed being hopeful for SNF placement. Patient's daughter expressed understanding of CSW role and discharge process as well as medical condition. No questions/concerns about plan or treatment.    Emotional Assessment Appearance:  Appears stated age Attitude/Demeanor/Rapport:  Unable to Assess Affect (typically observed):  Pleasant Orientation:  (Disoriented x4) Alcohol / Substance use:  Not Applicable Psych involvement (Current and /or in the community):  No (Comment)  Discharge Needs  Concerns to be addressed:  Care Coordination Readmission within the last 30 days:  No Current discharge risk:  None Barriers to Discharge:  Continued Medical Work up   [employer], LCSWA 10/25/2017, 1:26 PM

## 2017-10-26 ENCOUNTER — Encounter (HOSPITAL_COMMUNITY): Payer: Self-pay | Admitting: Internal Medicine

## 2017-10-26 DIAGNOSIS — R000 Tachycardia, unspecified: Secondary | ICD-10-CM

## 2017-10-26 DIAGNOSIS — I4891 Unspecified atrial fibrillation: Secondary | ICD-10-CM

## 2017-10-26 MED ORDER — METOPROLOL TARTRATE 5 MG/5ML IV SOLN
5.0000 mg | Freq: Once | INTRAVENOUS | Status: AC
  Administered 2017-10-26: 5 mg via INTRAVENOUS
  Filled 2017-10-26: qty 5

## 2017-10-26 MED ORDER — ACETAMINOPHEN 325 MG PO TABS
650.0000 mg | ORAL_TABLET | Freq: Four times a day (QID) | ORAL | Status: DC
  Administered 2017-10-26: 650 mg via ORAL
  Filled 2017-10-26: qty 2

## 2017-10-26 MED ORDER — DICLOFENAC SODIUM 1 % TD GEL
2.0000 g | Freq: Four times a day (QID) | TRANSDERMAL | Status: DC
  Administered 2017-10-26 (×2): 2 g via TOPICAL
  Filled 2017-10-26: qty 100

## 2017-10-26 MED ORDER — BISACODYL 10 MG RE SUPP: 10.0000 mg | Freq: Every day | RECTAL | Status: DC | PRN

## 2017-10-26 MED ORDER — CARVEDILOL 3.125 MG PO TABS: 3.1250 mg | ORAL_TABLET | Freq: Two times a day (BID) | ORAL | Status: DC

## 2017-10-26 MED ORDER — DILTIAZEM LOAD VIA INFUSION
10.0000 mg | Freq: Once | INTRAVENOUS | Status: AC
  Administered 2017-10-26: 10 mg via INTRAVENOUS
  Filled 2017-10-26: qty 10

## 2017-10-26 MED ORDER — METOPROLOL TARTRATE 25 MG PO TABS: 25.0000 mg | ORAL_TABLET | Freq: Two times a day (BID) | ORAL | Status: DC

## 2017-10-26 MED ORDER — DILTIAZEM HCL 100 MG IV SOLR
5.0000 mg/h | INTRAVENOUS | Status: DC
  Administered 2017-10-26: 5 mg/h via INTRAVENOUS
  Administered 2017-10-27: 10 mg/h via INTRAVENOUS
  Administered 2017-10-27: 12.5 mg/h via INTRAVENOUS
  Administered 2017-10-27 – 2017-10-28 (×2): 10 mg/h via INTRAVENOUS
  Administered 2017-10-29: 5 mg/h via INTRAVENOUS
  Administered 2017-10-30: 10 mg/h via INTRAVENOUS
  Administered 2017-10-30: 12.5 mg/h via INTRAVENOUS
  Filled 2017-10-26 (×8): qty 100

## 2017-10-26 MED ORDER — DOCUSATE SODIUM 100 MG PO CAPS
100.0000 mg | ORAL_CAPSULE | Freq: Two times a day (BID) | ORAL | Status: DC
  Administered 2017-10-26 – 2017-10-30 (×8): 100 mg via ORAL
  Filled 2017-10-26 (×9): qty 1

## 2017-10-26 MED ORDER — HALOPERIDOL LACTATE 2 MG/ML PO CONC
2.0000 mg | Freq: Once | ORAL | Status: AC
  Administered 2017-10-27: 2 mg via ORAL
  Filled 2017-10-26: qty 1

## 2017-10-26 MED ORDER — ACETAMINOPHEN 325 MG PO TABS
650.0000 mg | ORAL_TABLET | Freq: Four times a day (QID) | ORAL | Status: DC
  Administered 2017-10-26 – 2017-10-30 (×15): 650 mg via ORAL
  Filled 2017-10-26 (×15): qty 2

## 2017-10-26 NOTE — Progress Notes (Addendum)
PROGRESS NOTE    ANGELIN Wagner  YDX:[id number] DOB: 1929-04-29 DOA: 10/22/2017 PCP: Merlene Laughter, MD   Outpatient Specialists:     Brief Narrative:  Connie Wagner is a 84 y.o. female with medical history significant for Alzheimer's dementia, nursing home resident, history of CAD status post MI in 2014, ischemic cardiomyopathy, chronic low back pain, rheumatoid arthritis, GERD, depression, anxiety, anemia, hypertension, hyperlipidemia, recently started on Tamiflu on 10/13/2017 at the facility (per daughter's report, there were cases of flu at the nursing home, for which everybody was placed preventatively with the same med).  Since then, the patient has been experiencing several day history of progressive shortness of breath, with wheezing.  Per daughter's report, the patient had fallen at the facility, for which she was seen at the infirmary, where they noticed the patient having some respiratory distress, despite good O2 sats.  She had a T-max of 99.4.  She was sent to the ED for further evaluation and management of her symptoms.  Being treated for PNA.  Also had palliative care meetings-- plan is to rehab if able at SNF with palliative following, if not then transition to hospice     Assessment & Plan:   Principal Problem:   Pneumonia Active Problems:   Tachycardia   HTN (hypertension)   Anxiety   Dementia   Depression   Normocytic anemia   Coronary artery disease   Stroke (cerebrum) (HCC)   Carotid stenosis, bilateral   Shortness of breath   Wheezing   Weakness   Palliative care by specialist   Goals of care, counseling/discussion   Progressive shortness of breath and cough in the setting of Pneumonia  - Influenza by PCR was negative  -IV antibiotics with Cefepime and Vanco-- change to PO and monitor  -Strep pneumo urine antigen negative -Nebulizers as needed, with Xopenex q 6 prn  -Mucinex prn   a fib- rapid -will give IV BB -start PO metoprolol -h/o GIB so  no anticoagulation -per daughter, she has this history of a fib ( I could not find in chart)  Hypertension -monitor  Hyperlipidemia Continue home statins  History of ICM/CAD, last 2D echo  in November 2018 shows grade 1 diastolic dysfunction, EF 60-65%, normal systolic function in the left ventricle. Sinus tachycardia with PVCs, no significant changes from prior  Mechanical fall with bruising of the R little finger. Offered XR to r/o fracture, patient and daughter declined  -patient moves fingers well  History of Dementia Continue Seroquel for sleep   GERD, no acute symptoms Continue PPI  Foot pain -x ray negative of left foot -pain appears more like a sprain- try ACE wrap and voltaren gel   Poor overall prognosis Was to be d/c'd to SNF today but became combative with PT-- will try to treat pain, also no BM documented so ordered bowel regimen   DVT prophylaxis:  Lovenox   Code Status: DNR   Family Communication Called daughter-- she would appreciate updates daily- reasonable with decision making  Disposition Plan:  Palliative care to follow at SNF   Consultants:  Palliative care  Subjective: C/o pain in both feet today  Objective: Vitals:   10/25/17 2148 10/26/17 0519 10/26/17 0858 10/26/17 1400  BP: (!) 157/79 (!) 146/77 123/73 115/74  Pulse: 98 97 70 (!) 45  Resp: (!) 32 (!) 28  (!) 24  Temp: 97.7 F (36.5 C) 98.2 F (36.8 C)  97.6 F (36.4 C)  TempSrc: Oral Oral  Oral  SpO2:  93% 93%  95%  Weight:        Intake/Output Summary (Last 24 hours) at 10/26/2017 1435 Last data filed at 10/26/2017 0900 Gross per 24 hour  Intake 600 ml  Output 100 ml  Net 500 ml   Filed Weights   10/23/17 0513  Weight: 71.6 kg (157 lb 13.6 oz)    Examination:  General exam: in bed, sleeping but will awaken Respiratory system: no wheezing, poor effort Cardiovascular system: irregular Gastrointestinal system: +Bs, mildly distended Tender to palpation on  feet     Data Reviewed: I have personally reviewed following labs and imaging studies  CBC: Recent Labs  Lab 10/22/17 0700 10/23/17 0504  WBC 11.8* 6.2  NEUTROABS 9.4*  --   HGB 11.8* 10.8*  HCT 38.0 35.9*  MCV 89.6 90.4  PLT 173 146*   Basic Metabolic Panel: Recent Labs  Lab 10/22/17 0700 10/23/17 0504  NA 138 140  K 3.8 3.5  CL 103 110  CO2 23 22  GLUCOSE 136* 98  BUN 12 13  CREATININE 0.89 0.73  CALCIUM 8.5* 8.1*   GFR: Estimated Creatinine Clearance: 45.9 mL/min (by C-G formula based on SCr of 0.73 mg/dL). Liver Function Tests: No results for input(s): AST, ALT, ALKPHOS, BILITOT, PROT, ALBUMIN in the last 168 hours. No results for input(s): LIPASE, AMYLASE in the last 168 hours. No results for input(s): AMMONIA in the last 168 hours. Coagulation Profile: No results for input(s): INR, PROTIME in the last 168 hours. Cardiac Enzymes: No results for input(s): CKTOTAL, CKMB, CKMBINDEX, TROPONINI in the last 168 hours. BNP (last 3 results) No results for input(s): PROBNP in the last 8760 hours. HbA1C: No results for input(s): HGBA1C in the last 72 hours. CBG: No results for input(s): GLUCAP in the last 168 hours. Lipid Profile: No results for input(s): CHOL, HDL, LDLCALC, TRIG, CHOLHDL, LDLDIRECT in the last 72 hours. Thyroid Function Tests: No results for input(s): TSH, T4TOTAL, FREET4, T3FREE, THYROIDAB in the last 72 hours. Anemia Panel: No results for input(s): VITAMINB12, FOLATE, FERRITIN, TIBC, IRON, RETICCTPCT in the last 72 hours. Urine analysis:    Component Value Date/Time   COLORURINE YELLOW 10/22/2017 0648   APPEARANCEUR CLEAR 10/22/2017 0648   LABSPEC 1.017 10/22/2017 0648   PHURINE 5.0 10/22/2017 0648   GLUCOSEU NEGATIVE 10/22/2017 0648   HGBUR LARGE (A) 10/22/2017 0648   BILIRUBINUR NEGATIVE 10/22/2017 0648   KETONESUR 5 (A) 10/22/2017 0648   PROTEINUR 30 (A) 10/22/2017 0648   UROBILINOGEN 0.2 06/08/2015 1700   NITRITE NEGATIVE  10/22/2017 0648   LEUKOCYTESUR NEGATIVE 10/22/2017 0648      Recent Results (from the past 240 hour(s))  MRSA PCR Screening     Status: None   Collection Time: 10/22/17  8:45 PM  Result Value Ref Range Status   MRSA by PCR NEGATIVE NEGATIVE Final    Comment:        The GeneXpert MRSA Assay (FDA approved for NASAL specimens only), is one component of a comprehensive MRSA colonization surveillance program. It is not intended to diagnose MRSA infection nor to guide or monitor treatment for MRSA infections. Performed at [facility], [address], [place], [place] [address]       Anti-infectives (From admission, onward)   Start     Dose/Rate Route Frequency Ordered Stop   10/25/17 1700  cefUROXime (CEFTIN) tablet 500 mg    Comments:  Tolerated IV   500 mg Oral 2 times daily with meals 10/25/17 1549  10/23/17 0000  levofloxacin (LEVAQUIN) 500 MG tablet     500 mg Oral Daily 10/22/17 0917     10/22/17 1330  ceFEPIme (MAXIPIME) 1 g in sodium chloride 0.9 % 100 mL IVPB  Status:  Discontinued     1 g 200 mL/hr over 30 Minutes Intravenous Every 12 hours 10/22/17 1201 10/25/17 1547   10/22/17 0830  levofloxacin (LEVAQUIN) IVPB 500 mg     500 mg 100 mL/hr over 60 Minutes Intravenous  Once 10/22/17 0748 10/22/17 1055       Radiology Studies: Dg Foot Complete Left  Result Date: 10/25/2017 CLINICAL DATA:  Left leg pain, altered mental status. EXAM: LEFT FOOT - COMPLETE 3+ VIEW COMPARISON:  None. FINDINGS: No acute osseous or joint abnormality.  Calcaneal spurs. IMPRESSION: No acute findings. Electronically Signed   By: Leanna Battles M.D.   On: 10/25/2017 14:08        Scheduled Meds: . acetaminophen  650 mg Oral Q6H  . aspirin  81 mg Oral Daily  . carvedilol  3.125 mg Oral BID WC  . cefUROXime  500 mg Oral BID WC  . diclofenac sodium  2 g Topical QID  . docusate sodium  100 mg Oral BID  . enoxaparin (LOVENOX) injection  40 mg Subcutaneous Q24H  .  glycopyrrolate  0.4 mg Intravenous Q6H  . pantoprazole  40 mg Oral Daily  . polyethylene glycol  17 g Oral Daily  . pravastatin  40 mg Oral q1800  . QUEtiapine  25 mg Oral BID   Continuous Infusions:    LOS: 4 days    Time spent: 25 min    Joseph Art, DO [facility] Pager [phone]  If 7PM-7AM, please contact night-coverage [email or web address] Password [facility] 10/26/2017, 2:35 PM

## 2017-10-26 NOTE — Plan of Care (Signed)
  No Outcome Safety: Ability to remain free from injury will improve 10/26/2017 0600 by Burtis Junes, RN Note Patient did well overnight. Attempted to get out of bed twice. with Telesitter in room,but easily redirected.

## 2017-10-26 NOTE — Progress Notes (Signed)
PT Cancellation Note  Patient Details Name: Connie Wagner MRN: [id number] DOB: 20-Feb-1929   Cancelled Treatment:    Reason Eval/Treat Not Completed: Patient declined, no reason specified. Pt unable to keep eyes open while therapist and tech prepared room but responsive to questions. When therapist asked pt to sit up pt became agitated, hitting bed and therapist's arm.  Pt refused any mobility despite max encouragement. Will try back again tomorrow.  Kallie Locks, PTA Pager [phone] Acute Rehab   Sheral Apley 10/26/2017, 12:00 PM

## 2017-10-26 NOTE — Progress Notes (Signed)
Cardizem drip started and patient is tolerating well, hr 110s to 120s, remains in AFIB at this time.

## 2017-10-26 NOTE — Care Management Important Message (Signed)
Important Message  Patient Details  Name: Connie Wagner MRN: [id number] Date of Birth: Oct 04, 1928   Medicare Important Message Given:  Yes    Alyn Riedinger 10/26/2017, 1:15 PM

## 2017-10-26 NOTE — Progress Notes (Signed)
Pt's HR sustaining in the 170's. MD Benjamine Mola and rapid response nurse made aware. Cardizem drip and bolus ordered. Waiting on meds to be sent up from pharmacy. Will continue to assess.

## 2017-10-27 DIAGNOSIS — E785 Hyperlipidemia, unspecified: Secondary | ICD-10-CM

## 2017-10-27 DIAGNOSIS — F0280 Dementia in other diseases classified elsewhere without behavioral disturbance: Secondary | ICD-10-CM

## 2017-10-27 LAB — CBC
HCT: 33.2 % — ABNORMAL LOW (ref 36.0–46.0)
Hemoglobin: 10.3 g/dL — ABNORMAL LOW (ref 12.0–15.0)
MCH: 27.2 pg (ref 26.0–34.0)
MCHC: 31 g/dL (ref 30.0–36.0)
MCV: 87.8 fL (ref 78.0–100.0)
PLATELETS: 194 10*3/uL (ref 150–400)
RBC: 3.78 MIL/uL — ABNORMAL LOW (ref 3.87–5.11)
RDW: 16.1 % — AB (ref 11.5–15.5)
WBC: 8.4 10*3/uL (ref 4.0–10.5)

## 2017-10-27 LAB — BASIC METABOLIC PANEL
ANION GAP: 10 (ref 5–15)
BUN: 16 mg/dL (ref 6–20)
CALCIUM: 8.4 mg/dL — AB (ref 8.9–10.3)
CO2: 23 mmol/L (ref 22–32)
CREATININE: 0.77 mg/dL (ref 0.44–1.00)
Chloride: 106 mmol/L (ref 101–111)
Glucose, Bld: 128 mg/dL — ABNORMAL HIGH (ref 65–99)
Potassium: 3.3 mmol/L — ABNORMAL LOW (ref 3.5–5.1)
SODIUM: 139 mmol/L (ref 135–145)

## 2017-10-27 LAB — MAGNESIUM: Magnesium: 2.2 mg/dL (ref 1.7–2.4)

## 2017-10-27 MED ORDER — POTASSIUM CHLORIDE CRYS ER 20 MEQ PO TBCR
40.0000 meq | EXTENDED_RELEASE_TABLET | Freq: Once | ORAL | Status: AC
  Administered 2017-10-27: 40 meq via ORAL
  Filled 2017-10-27: qty 2

## 2017-10-27 MED ORDER — GUAIFENESIN ER 600 MG PO TB12
600.0000 mg | ORAL_TABLET | Freq: Two times a day (BID) | ORAL | Status: DC
  Administered 2017-10-27 – 2017-10-30 (×7): 600 mg via ORAL
  Filled 2017-10-27 (×7): qty 1

## 2017-10-27 MED ORDER — BENZONATATE 100 MG PO CAPS: 200.0000 mg | ORAL_CAPSULE | Freq: Three times a day (TID) | ORAL | Status: DC | PRN

## 2017-10-27 MED ORDER — METOPROLOL TARTRATE 25 MG PO TABS
25.0000 mg | ORAL_TABLET | Freq: Two times a day (BID) | ORAL | Status: DC
  Administered 2017-10-27 (×2): 25 mg via ORAL
  Filled 2017-10-27 (×2): qty 1

## 2017-10-27 NOTE — Progress Notes (Addendum)
  Speech Language Pathology Treatment: Dysphagia  Patient Details Name: Connie Wagner MRN: [id number] DOB: December 21, 1928 Today's Date: 10/27/2017 Time: 3299-2426 SLP Time Calculation (min) (ACUTE ONLY): 16 min  Assessment / Plan / Recommendation Clinical Impression  Pt presents with continued Min oral deficits, including increased mastication and bolus formation time, during PO trials from lunch tray. Pt benefited from Max verbal/visual cues to attend to bolus and repeated liquid washes to ensure cleared oral cavity. Pt was impulsive with thin liquid, and consumed large sips of thin liquid without immediate signs concerning for aspiration. One delayed cough was noted at end of session; however, given significant delay and no further signs of aspiration, cough not likely related to PO intake. Of note, pt was difficult to maintain position in bed and maintained a partly reclined posture during trials. Given observed difficulty clearing oral cavity and increased need for cues, recommend downgrading to Dys 2 and thin liquid diet with aspiration precautions and full supervision to ensure oral cavity clearance. SLP will follow-up to ensure safety with updated diet.      HPI HPI:  84 y.o. female with medical history significant for Alzheimer's dementia, nursing home resident, history of CAD status post MI in 2014, ischemic cardiomyopathy, chronic low back pain, rheumatoid arthritis, GERD, depression, anxiety, anemia, hypertension, hyperlipidemia, recently started on Tamiflu on 10/13/2017 at the facility (per daughter's report, there were cases of flu at the nursing home, for which everybody was placed preventatively with the same med).  Since then, the patient has been experiencing several day history of progressive shortness of breath, with wheezing.  Per daughter's report, the patient had fallen at the facility, for which she was seen at the infirmary, where they noticed the patient having some respiratory  distress, despite good O2 sats.       SLP Plan  Goals updated       Recommendations  Diet recommendations: Dysphagia 2 (fine chop);Thin liquid Liquids provided via: Cup;Straw Medication Administration: Whole meds with puree Supervision: Patient able to self feed;Full supervision/cueing for compensatory strategies Compensations: Minimize environmental distractions;Slow rate;Small sips/bites Postural Changes and/or Swallow Maneuvers: Seated upright 90 degrees                Oral Care Recommendations: Oral care BID Follow up Recommendations: Skilled Nursing facility SLP Visit Diagnosis: Dysphagia, oral phase (R13.11) Plan: Goals updated       GO              [place] Milano Rosevear SLP Student Clinician   [place] Natalio Salois 10/27/2017, 4:33 PM

## 2017-10-27 NOTE — Progress Notes (Signed)
CSW spoke with patient's daughter again to explain that patient may not be authorized by insurance and if that is the case, patient will have to be picked up from SNF. She would still like to try for rehab. 5 day LOG permission granted by CSW AD and accepted by [facility] [place] for transfer today. Patient's daughter going out of town this afternoon and [facility] agreed to email her admission paperwork this morning.   Osborne Casco Shima Compere LCSW [phone]

## 2017-10-27 NOTE — Progress Notes (Signed)
Physical Therapy Treatment Patient Details Name: Connie Wagner MRN: [id number] DOB: 08/09/1929 Today's Date: 10/27/2017    History of Present Illness Connie Wagner is a 84 y.o. female with medical history significant for Alzheimer's dementia, nursing home resident, history of CAD status post MI in 2014, ischemic cardiomyopathy, chronic low back pain, rheumatoid arthritis, GERD, depression, anxiety, anemia, hypertension, hyperlipidemia, recently started on Tamiflu on 10/13/2017 at the facility (per daughter's report, there were cases of flu at the nursing home, for which everybody was placed preventatively with the same med).  Since then, the patient has been experiencing several day history of progressive shortness of breath, with wheezing.     PT Comments    Pt performed increased activity and responded well to therapy this am.  Pt performed bed mobility and progressed to steps to chair from the edge of her bed.  Once in the chair, pt able to follow commands and participate in supine (reclined) and seated exercises.  Pt remains appropriate for SNF placement for rehab to improve strength and function.   Follow Up Recommendations  SNF;Supervision/Assistance - 24 hour     Equipment Recommendations  None recommended by PT(TBD)    Recommendations for Other Services       Precautions / Restrictions Precautions Precautions: Fall Precaution Comments: confusion/dementia Restrictions Weight Bearing Restrictions: No    Mobility  Bed Mobility Overal bed mobility: Needs Assistance Bed Mobility: Supine to Sit;Sit to Supine Rolling: Modified independent (Device/Increase time)   Supine to sit: Min assist;+2 for physical assistance     General bed mobility comments: Pt required cues for hand placement and assistance to advance LEs to edge of bed and to elevate trunk into sitting.    Transfers Overall transfer level: Needs assistance Equipment used: Rolling walker (2 wheeled) Transfers:  Sit to/from Stand Sit to Stand: +2 safety/equipment;Mod assist Stand pivot transfers: Mod assist       General transfer comment: Cues for hand placement to and from surface.  Pt require mod assist to boost into standing with decreased weight bearing on R foot.   pt able to stand upright and advance steps toward chair.    Ambulation/Gait Ambulation/Gait assistance: Min assist Ambulation Distance (Feet): 4 Feet(from bed to chair. ) Assistive device: Rolling walker (2 wheeled) Gait Pattern/deviations: Step-through pattern;Decreased stride length;Shuffle;Decreased stance time - right     General Gait Details: Series of side stepping, turning and backing.  Pt remains painful in R foot but does better following commands to turn and sit.     Stairs            Wheelchair Mobility    Modified Rankin (Stroke Patients Only)       Balance     Sitting balance-Leahy Scale: Fair       Standing balance-Leahy Scale: Poor                              Cognition Arousal/Alertness: Awake/alert Behavior During Therapy: Flat affect;Impulsive Overall Cognitive Status: History of cognitive impairments - at baseline                                 General Comments: h/o of alzheimers dementia. pt is a poor historian, unaware she was in the hospital. unable to state where she lives. pt easily irritated when people come into the room and she doesnt know them.  Exercises Total Joint Exercises Ankle Circles/Pumps: AROM;Both;10 reps;Supine Hip ABduction/ADduction: AROM;Both;10 reps;Supine Long Arc Quad: AROM;Both;10 reps;Supine Marching in Standing: AROM;Both;10 reps;Supine    General Comments        Pertinent Vitals/Pain Pain Assessment: Faces Faces Pain Scale: Hurts little more Pain Location: R foot, calf Pain Descriptors / Indicators: Grimacing(decreased weight bearing in standing.  ) Pain Intervention(s): Monitored during session;Repositioned     Home Living                      Prior Function            PT Goals (current goals can now be found in the care plan section) Acute Rehab PT Goals Patient Stated Goal: didn't state Potential to Achieve Goals: Good Progress towards PT goals: Progressing toward goals    Frequency    Min 2X/week      PT Plan Current plan remains appropriate    Co-evaluation              AM-PAC PT "6 Clicks" Daily Activity  Outcome Measure  Difficulty turning over in bed (including adjusting bedclothes, sheets and blankets)?: Unable Difficulty moving from lying on back to sitting on the side of the bed? : Unable Difficulty sitting down on and standing up from a chair with arms (e.g., wheelchair, bedside commode, etc,.)?: Unable Help needed moving to and from a bed to chair (including a wheelchair)?: A Lot Help needed walking in hospital room?: A Lot Help needed climbing 3-5 steps with a railing? : A Lot 6 Click Score: 9    End of Session Equipment Utilized During Treatment: Gait belt Activity Tolerance: Patient limited by pain(R foot pain) Patient left: with call bell/phone within reach;in chair;with chair alarm set Nurse Communication: Mobility status PT Visit Diagnosis: Unsteadiness on feet (R26.81);Pain Pain - Right/Left: Right Pain - part of body: Ankle and joints of foot     Time: 1010-1027 PT Time Calculation (min) (ACUTE ONLY): 17 min  Charges:  $Therapeutic Activity: 8-22 mins                    G Codes:       [name], PTA pager [phone]    Florestine Avers 10/27/2017, 1:04 PM

## 2017-10-27 NOTE — Plan of Care (Signed)
  Progressing Activity: Risk for activity intolerance will decrease 10/27/2017 0504 - Progressing by Burtis Junes, RN

## 2017-10-27 NOTE — Progress Notes (Signed)
10/27/17 1151  What Happened  Was fall witnessed? No  Was patient injured? No  Patient found on floor  Found by Staff-comment (Housekeeper)  Stated prior activity other (comment) (sitting in the chair)  Follow Up  MD notified Md Ghimire  Time MD notified 1200  Family notified (left message for daugther Desmond Dike)  Time family notified 1310  Additional tests No  Progress note created (see row info) Yes  Adult Fall Risk Assessment  Risk Factor Category (scoring not indicated) Fall has occurred during this admission (document High fall risk)  Patient's Fall Risk High Fall Risk (>13 points)  Adult Fall Risk Interventions  Required Bundle Interventions *See Row Information* High fall risk - low, moderate, and high requirements implemented  Additional Interventions Use of appropriate toileting equipment (bedpan, BSC, etc.)  Screening for Fall Injury Risk  Risk For Fall Injury- See Row Information  Bones - fracture risk  Required Injury Bundle Interventions *See Row Information* Injury Bundle Implemented  Vitals  BP 102/67  MAP (mmHg) 79  Pulse Rate 83  ECG Heart Rate 83  Resp (!) 21  Oxygen Therapy  SpO2 95 %  Pain Assessment  Pain Assessment No/denies pain  Pain Score 0  Neurological  Neuro (WDL) X  Level of Consciousness Alert  Orientation Level Oriented to person;Disoriented to place;Disoriented to time;Disoriented to situation  Nurse, learning disability awareness;Memory impairment  Speech Clear  Pupil Assessment  No  Motor Function/Sensation Assessment Grip;Motor response;Sensation;Motor strength  R Hand Grip Moderate  L Hand Grip Moderate   RUE Motor Response Responds to commands  RUE Sensation Full sensation  RUE Motor Strength 4  LUE Motor Response Responds to commands  LUE Sensation Full sensation  LUE Motor Strength 4  RLE Motor Response Responds to commands  RLE Sensation Full sensation  RLE Motor Strength 3  LLE Motor Response  Responds to commands  LLE Sensation Full sensation  LLE Motor Strength 3  Neuro Symptoms Forgetful  Musculoskeletal  Musculoskeletal (WDL) X  Assistive Device None  Generalized Weakness Yes  Weight Bearing Restrictions No  Integumentary  Integumentary (WDL) X  Skin Color Pale  Skin Condition Dry  Skin Integrity Ecchymosis;Abrasion  Abrasion Location Hand;Elbow  Abrasion Location Orientation Right  Abrasion Intervention Other (Comment)  Ecchymosis Location Arm;Buttocks  Ecchymosis Location Orientation Bilateral  Ecchymosis Intervention Other (Comment)  Moisture Associated Skin Damage Location Buttocks  Moisture Associated Skin Damage Orientation Right;Left;Mid  Moisture Associated Skin Damage Intervention Barrier cream;Cleansed  Skin Turgor Non-tenting

## 2017-10-27 NOTE — Progress Notes (Addendum)
PROGRESS NOTE        PATIENT DETAILS Name: Connie Wagner Age: 84 y.o. Sex: female Date of Birth: 02/18/1929 Admit Date: 10/22/2017 Admitting Physician Lahoma Crocker, MD ZOX:[id number], Ann Maki, MD  Brief Narrative: Patient is a 84 y.o. female with history of dementia, CAD status post PCI in 2014, ischemic cardiomyopathy who presented to the hospital for cough, shortness of breath, thought to have pneumonia and admitted to the hospitalist service.  For the hospital course complicated by atrial fibrillation with RVR.  See below for further details  Subjective: Lying comfortably in bed-still coughing.  Heart rate mostly in the 90s this morning-still on Cardizem infusion.   Assessment/Plan: Healthcare associated pneumonia/right lobar pneumonia: Initially on broad-spectrum antimicrobial therapy-but with clinical improvement-has been transitioned to oral antimicrobial therapy.  Remains afebrile without leukocytosis.  Influenza PCR was negative.  Note chest x-ray reviewed by me personally this morning.  Atrial fibrillation with RVR: Heart rate slowly improving-has been in the 120s this morning-but seems to have improved to mostly the 90s when I saw the patient earlier, remains on a Cardizem infusion-will start oral beta-blocker-have asked nursing staff to see if we can titrate off Cardizem infusion today.  Chads 2 Vas score of at least 6-not anticoagulation candidate given advanced age, frailty-and prior history of GI bleeding.  Remains on aspirin.  Hypokalemia: Replete and recheck  History of CAD/CVA: Currently with no anginal symptoms-neurological exam is unchanged--continue aspirin, statin and beta-blocker.  GERD: Continue PPI  Dyslipidemia: Continue statin  Dementia: At risk of delirium-answer some of my questions appropriately-continue Seroquel.  Telemetry (independently reviewed): Atrial fibrillation with rate and mostly in the 90s this morning.  DVT  Prophylaxis: Prophylactic Lovenox   Code Status:  DNR  Family Communication: None at bedside-but spoke with her daughter over the phone  Disposition Plan: Remain inpatient-SNF on discharge-hopefully tomorrow if heart rate better controlled  Antimicrobial agents: Anti-infectives (From admission, onward)   Start     Dose/Rate Route Frequency Ordered Stop   10/25/17 1700  cefUROXime (CEFTIN) tablet 500 mg    Comments:  Tolerated IV   500 mg Oral 2 times daily with meals 10/25/17 1549     10/23/17 0000  levofloxacin (LEVAQUIN) 500 MG tablet     500 mg Oral Daily 10/22/17 0917     10/22/17 1330  ceFEPIme (MAXIPIME) 1 g in sodium chloride 0.9 % 100 mL IVPB  Status:  Discontinued     1 g 200 mL/hr over 30 Minutes Intravenous Every 12 hours 10/22/17 1201 10/25/17 1547   10/22/17 0830  levofloxacin (LEVAQUIN) IVPB 500 mg     500 mg 100 mL/hr over 60 Minutes Intravenous  Once 10/22/17 0748 10/22/17 1055      Procedures: None  CONSULTS:  None  Time spent: 35- minutes-Greater than 50% of this time was spent in counseling, explanation of diagnosis, planning of further management, and coordination of care.  MEDICATIONS: Scheduled Meds: . acetaminophen  650 mg Oral Q6H  . aspirin  81 mg Oral Daily  . cefUROXime  500 mg Oral BID WC  . diclofenac sodium  2 g Topical QID  . docusate sodium  100 mg Oral BID  . enoxaparin (LOVENOX) injection  40 mg Subcutaneous Q24H  . glycopyrrolate  0.4 mg Intravenous Q6H  . guaiFENesin  600 mg Oral BID  . metoprolol tartrate  25  mg Oral BID  . pantoprazole  40 mg Oral Daily  . polyethylene glycol  17 g Oral Daily  . pravastatin  40 mg Oral q1800  . QUEtiapine  25 mg Oral BID   Continuous Infusions: . diltiazem (CARDIZEM) infusion 12.5 mg/hr (10/27/17 0459)   PRN Meds:.antiseptic oral rinse, benzonatate, bisacodyl, hydrALAZINE, levalbuterol, polyvinyl alcohol   PHYSICAL EXAM: Vital signs: Vitals:   10/27/17 0500 10/27/17 0549 10/27/17  0800 10/27/17 0942  BP:  115/67  (!) 119/58  Pulse:  94  (!) 102  Resp:  (!) 21  (!) 22  Temp:   98.4 F (36.9 C)   TempSrc:   Oral   SpO2:  94%  96%  Weight: 70.9 kg (156 lb 4.8 oz)      Filed Weights   10/23/17 0513 10/27/17 0500  Weight: 71.6 kg (157 lb 13.6 oz) 70.9 kg (156 lb 4.8 oz)   Body mass index is 28.59 kg/m.   General appearance :Awake, pleasantly confused.  Coughing but not in any sort of distress this morning. Eyes:, pupils equally reactive to light and accomodation,no scleral icterus.Pink conjunctiva HEENT: Atraumatic and Normocephalic Neck: supple, no JVD. No cervical lymphadenopathy. No thyromegaly Resp:Good air entry bilaterally, no added sounds  CVS: S1 S2 irregular-slightly tachycardic.  GI: Bowel sounds present, Non tender and not distended with no gaurding, rigidity or rebound.No organomegaly Extremities: B/L Lower Ext shows no edema, both legs are warm to touch Neurology: Moves all 4 extremities-difficult exam given dementia Musculoskeletal:No digital cyanosis Skin:No Rash, warm and dry Wounds:N/A  I have personally reviewed following labs and imaging studies  LABORATORY DATA: CBC: Recent Labs  Lab 10/22/17 0700 10/23/17 0504 10/27/17 0741  WBC 11.8* 6.2 8.4  NEUTROABS 9.4*  --   --   HGB 11.8* 10.8* 10.3*  HCT 38.0 35.9* 33.2*  MCV 89.6 90.4 87.8  PLT 173 146* 194    Basic Metabolic Panel: Recent Labs  Lab 10/22/17 0700 10/23/17 0504 10/27/17 0741  NA 138 140 139  K 3.8 3.5 3.3*  CL 103 110 106  CO2 23 22 23   GLUCOSE 136* 98 128*  BUN 12 13 16   CREATININE 0.89 0.73 0.77  CALCIUM 8.5* 8.1* 8.4*  MG  --   --  2.2    GFR: Estimated Creatinine Clearance: 45.7 mL/min (by C-G formula based on SCr of 0.77 mg/dL).  Liver Function Tests: No results for input(s): AST, ALT, ALKPHOS, BILITOT, PROT, ALBUMIN in the last 168 hours. No results for input(s): LIPASE, AMYLASE in the last 168 hours. No results for input(s): AMMONIA in the  last 168 hours.  Coagulation Profile: No results for input(s): INR, PROTIME in the last 168 hours.  Cardiac Enzymes: No results for input(s): CKTOTAL, CKMB, CKMBINDEX, TROPONINI in the last 168 hours.  BNP (last 3 results) No results for input(s): PROBNP in the last 8760 hours.  HbA1C: No results for input(s): HGBA1C in the last 72 hours.  CBG: No results for input(s): GLUCAP in the last 168 hours.  Lipid Profile: No results for input(s): CHOL, HDL, LDLCALC, TRIG, CHOLHDL, LDLDIRECT in the last 72 hours.  Thyroid Function Tests: No results for input(s): TSH, T4TOTAL, FREET4, T3FREE, THYROIDAB in the last 72 hours.  Anemia Panel: No results for input(s): VITAMINB12, FOLATE, FERRITIN, TIBC, IRON, RETICCTPCT in the last 72 hours.  Urine analysis:    Component Value Date/Time   COLORURINE YELLOW 10/22/2017 0648   APPEARANCEUR CLEAR 10/22/2017 0648   LABSPEC 1.017 10/22/2017 9038  PHURINE 5.0 10/22/2017 0648   GLUCOSEU NEGATIVE 10/22/2017 0648   HGBUR LARGE (A) 10/22/2017 0648   BILIRUBINUR NEGATIVE 10/22/2017 0648   KETONESUR 5 (A) 10/22/2017 0648   PROTEINUR 30 (A) 10/22/2017 0648   UROBILINOGEN 0.2 06/08/2015 1700   NITRITE NEGATIVE 10/22/2017 0648   LEUKOCYTESUR NEGATIVE 10/22/2017 0648    Sepsis Labs: Lactic Acid, Venous    Component Value Date/Time   LATICACIDVEN 0.98 10/22/2017 0904    MICROBIOLOGY: Recent Results (from the past 240 hour(s))  MRSA PCR Screening     Status: None   Collection Time: 10/22/17  8:45 PM  Result Value Ref Range Status   MRSA by PCR NEGATIVE NEGATIVE Final    Comment:        The GeneXpert MRSA Assay (FDA approved for NASAL specimens only), is one component of a comprehensive MRSA colonization surveillance program. It is not intended to diagnose MRSA infection nor to guide or monitor treatment for MRSA infections. Performed at [facility], [address] [place], [place] [address]     RADIOLOGY  STUDIES/RESULTS: Dg Chest 2 View  Result Date: 10/23/2017 CLINICAL DATA:  Pneumonia EXAM: CHEST  2 VIEW COMPARISON:  10/22/2017, 10/20/2012 FINDINGS: Bilateral mild interstitial thickening. No pleural effusion or pneumothorax. No focal consolidation. Stable cardiomegaly. Thoracic aortic atherosclerosis. Mild osteoarthritis of the left glenohumeral joint. Chronic midthoracic spine vertebral body compression fractures. IMPRESSION: Cardiomegaly with mild pulmonary vascular congestion. Electronically Signed   By: Elige Ko   On: 10/23/2017 09:06   Dg Chest 2 View  Result Date: 10/22/2017 CLINICAL DATA:  Dyspnea and fever EXAM: CHEST  2 VIEW COMPARISON:  11/17/2014 chest radiograph. FINDINGS: Stable cardiomediastinal silhouette with mild cardiomegaly. No pneumothorax. No pleural effusion. Mild patchy right lower parahilar lung opacity. Multiple mid to lower thoracic and upper lumbar vertebral compression deformities appear chronic and stable. IMPRESSION: Mild patchy right lower parahilar lung opacity, which could represent atelectasis, aspiration or pneumonia. Recommend short-term follow-up PA and lateral chest radiographs. Mild cardiomegaly. Electronically Signed   By: Delbert Phenix M.D.   On: 10/22/2017 07:24   Dg Foot Complete Left  Result Date: 10/25/2017 CLINICAL DATA:  Left leg pain, altered mental status. EXAM: LEFT FOOT - COMPLETE 3+ VIEW COMPARISON:  None. FINDINGS: No acute osseous or joint abnormality.  Calcaneal spurs. IMPRESSION: No acute findings. Electronically Signed   By: Leanna Battles M.D.   On: 10/25/2017 14:08     LOS: 5 days   Jeoffrey Massed, MD  [facility] Pager:[phone]  If 7PM-7AM, please contact night-coverage [email or web address] Password [email or web address] 10/27/2017, 10:31 AM

## 2017-10-28 LAB — BASIC METABOLIC PANEL
ANION GAP: 11 (ref 5–15)
BUN: 16 mg/dL (ref 6–20)
CHLORIDE: 105 mmol/L (ref 101–111)
CO2: 24 mmol/L (ref 22–32)
Calcium: 8.8 mg/dL — ABNORMAL LOW (ref 8.9–10.3)
Creatinine, Ser: 0.91 mg/dL (ref 0.44–1.00)
GFR, EST NON AFRICAN AMERICAN: 55 mL/min — AB (ref >60)
Glucose, Bld: 114 mg/dL — ABNORMAL HIGH (ref 65–99)
POTASSIUM: 4 mmol/L (ref 3.5–5.1)
SODIUM: 140 mmol/L (ref 135–145)

## 2017-10-28 MED ORDER — METOPROLOL TARTRATE 25 MG PO TABS: 37.5000 mg | ORAL_TABLET | Freq: Two times a day (BID) | ORAL | 0 refills | Status: DC

## 2017-10-28 MED ORDER — DICLOFENAC SODIUM 1 % TD GEL: 2.0000 g | Freq: Four times a day (QID) | TRANSDERMAL | 0 refills | Status: DC

## 2017-10-28 MED ORDER — CEFUROXIME AXETIL 500 MG PO TABS: 500.0000 mg | ORAL_TABLET | Freq: Two times a day (BID) | ORAL | 0 refills | Status: DC

## 2017-10-28 MED ORDER — BENZONATATE 200 MG PO CAPS: 200.0000 mg | ORAL_CAPSULE | Freq: Three times a day (TID) | ORAL | 0 refills | Status: DC | PRN

## 2017-10-28 MED ORDER — GUAIFENESIN ER 600 MG PO TB12: 600.0000 mg | ORAL_TABLET | Freq: Two times a day (BID) | ORAL | 0 refills | Status: DC

## 2017-10-28 MED ORDER — METOPROLOL TARTRATE 25 MG PO TABS
25.0000 mg | ORAL_TABLET | Freq: Three times a day (TID) | ORAL | Status: DC
  Administered 2017-10-28 – 2017-10-29 (×4): 25 mg via ORAL
  Filled 2017-10-28 (×4): qty 1

## 2017-10-28 MED ORDER — POLYETHYLENE GLYCOL 3350 17 G PO PACK: 17.0000 g | PACK | Freq: Every day | ORAL | 0 refills | Status: DC

## 2017-10-28 NOTE — Discharge Summary (Addendum)
PATIENT DETAILS Name: Connie Wagner Age: 84 y.o. Sex: female Date of Birth: 1929-08-12 MRN: [id number]. Admitting Physician: Lahoma Crocker, MD WUJ:[id number], Ann Maki, MD  Admit Date: 10/22/2017 Discharge date: 10/30/2017  Recommendations for Outpatient Follow-up:  1. Follow up with PCP in 1-2 weeks 2. Please obtain BMP/CBC in one week 3. Please repeat two-view chest x-ray in 3-4 weeks to document resolution of pneumonia  Admitted From:  Home  Disposition: SNF   Home Health: No  Equipment/Devices: None  Discharge Condition: Stable  CODE STATUS:  DNR  Diet recommendation:  Heart Healthy dysphagia 2 diet with feeding assistance and aspiration precautions.  Brief Summary: See H&P, Labs, Consult and Test reports for all details in brief, Patient is a 84 y.o. female with history of dementia, CAD status post PCI in 2014, ischemic cardiomyopathy who presented to the hospital for cough, shortness of breath, thought to have pneumonia and admitted to the hospitalist service.  For the hospital course complicated by atrial fibrillation with RVR.  See below for further details  Brief Hospital Course: Healthcare associated pneumonia/right lobar pneumonia: Initially on broad-spectrum antimicrobial therapy-but with clinical improvement-has been transitioned to oral antimicrobial therapy.  Remains afebrile without leukocytosis.  Influenza PCR was negative.   Tonic atrial fibrillation with RVR:  Hospital course complicated by A. fib with RVR-requiring initiation of Cardizem infusion-stable on oral Lopressor and Cardizem, continue aspirin as before goal is rate controlled with a rate under 110 and symptom-free.  Vas 2 score of at least 5.  Not on anticoagulation prior to admission due to fall risk.  Hypokalemia: Repleted  History of CAD/CVA: Currently with no anginal symptoms-neurological exam is unchanged--continue aspirin, statin and beta-blocker.  GERD: Continue  PPI  Dyslipidemia: Continue statin  Dementia: At risk of delirium-answer some of my questions appropriately-remains calm and without any significant behavioral issues-continue Seroquel.  Note-updated daughter over the phone regarding discharge plans today   Procedures/Studies: None  Discharge Diagnoses:  Principal Problem:   Acute pneumonia Active Problems:   Tachycardia   HTN (hypertension)   Anxiety   Dementia   Depression   Normocytic anemia   Coronary artery disease   Stroke (cerebrum) (HCC)   Carotid stenosis, bilateral   Shortness of breath   Wheezing   Weakness   Palliative care by specialist   Goals of care, counseling/discussion   Atrial fibrillation with RVR [facility])   Discharge Instructions:  Activity:  As tolerated with Full fall precautions use walker/cane & assistance as needed   Discharge Instructions    Call MD for:  difficulty breathing, headache or visual disturbances   Complete by:  As directed    Discharge instructions   Complete by:  As directed    Follow with Primary MD Merlene Laughter, MD in 7 days   Get CBC, CMP, 2 view Chest X ray checked  by Primary MD or SNF MD in 5-7 days   Activity: As tolerated with Full fall precautions use walker/cane & assistance as needed  Disposition SNF  Diet:   DIET DYS 2 with feeding assistance and aspiration precautions.  For Heart failure patients - Check your Weight same time everyday, if you gain over 2 pounds, or you develop in leg swelling, experience more shortness of breath or chest pain, call your Primary MD immediately. Follow Cardiac Low Salt Diet and 1.5 lit/day fluid restriction.  Special Instructions: If you have smoked or chewed Tobacco  in the last 2 yrs please stop smoking, stop any regular Alcohol  and or any Recreational drug use.  On your next visit with your primary care physician please Get Medicines reviewed and adjusted.  Please request your Prim.MD to go over all Hospital Tests  and Procedure/Radiological results at the follow up, please get all Hospital records sent to your Prim MD by signing hospital release before you go home.  If you experience worsening of your admission symptoms, develop shortness of breath, life threatening emergency, suicidal or homicidal thoughts you must seek medical attention immediately by calling 911 or calling your MD immediately  if symptoms less severe.  You Must read complete instructions/literature along with all the possible adverse reactions/side effects for all the Medicines you take and that have been prescribed to you. Take any new Medicines after you have completely understood and accpet all the possible adverse reactions/side effects.   Do not drive, operate heavy machinery, perform activities at heights, swimming or participation in water activities or provide baby sitting services if your were admitted for syncope or siezures until you have seen by Primary MD or a Neurologist and advised to do so again.  Do not drive when taking Pain medications.    Do not take more than prescribed Pain, Sleep and Anxiety Medications  Wear Seat belts while driving.   Please note  You were cared for by a hospitalist during your hospital stay. If you have any questions about your discharge medications or the care you received while you were in the hospital after you are discharged, you can call the unit and asked to speak with the hospitalist on call if the hospitalist that took care of you is not available. Once you are discharged, your primary care physician will handle any further medical issues. Please note that NO REFILLS for any discharge medications will be authorized once you are discharged, as it is imperative that you return to your primary care physician (or establish a relationship with a primary care physician if you do not have one) for your aftercare needs so that they can reassess your need for medications and monitor your lab  values.   Increase activity slowly   Complete by:  As directed    Increase activity slowly   Complete by:  As directed      Allergies as of 10/30/2017      Reactions   Augmentin [amoxicillin-pot Clavulanate] Other (See Comments)   On MAR   Codeine Other (See Comments)   On MAR   Cozaar [losartan Potassium] Other (See Comments)   On MAR   Evista [raloxifene Hcl] Other (See Comments)   On MAR   Flagyl [metronidazole] Other (See Comments)   On MAR   Iron Nausea And Vomiting   Loose stool   Lexapro [escitalopram Oxalate] Other (See Comments)   On MAR   Tetracyclines & Related Other (See Comments)   On MAR   Penicillins Rash   Has patient had a PCN reaction causing immediate rash, facial/tongue/throat swelling, SOB or lightheadedness with hypotension: No Has patient had a PCN reaction causing severe rash involving mucus membranes or skin necrosis: No Has patient had a PCN reaction that required hospitalization No Has patient had a PCN reaction occurring within the last 10 years: No If all of the above answers are "NO", then may proceed with Cephalosporin use.   Sulfa Drugs Cross Reactors Rash      Medication List    STOP taking these medications   oseltamivir 75 MG capsule Commonly known as:  TAMIFLU  TAKE these medications   acetaminophen 325 MG tablet Commonly known as:  TYLENOL Take 2 tablets (650 mg total) every 6 (six) hours as needed by mouth for mild pain or fever.   aspirin 81 MG EC tablet Take 1 tablet (81 mg total) daily by mouth.   benzonatate 200 MG capsule Commonly known as:  TESSALON Take 1 capsule (200 mg total) by mouth 3 (three) times daily as needed for cough.   cefUROXime 500 MG tablet Commonly known as:  CEFTIN Take 1 tablet (500 mg total) by mouth 2 (two) times daily with a meal. Foe 2 more days from 3/2 and then stop   diclofenac sodium 1 % Gel Commonly known as:  VOLTAREN Apply 2 g topically 4 (four) times daily.   digoxin 0.125 MG  tablet Commonly known as:  LANOXIN Take 1 tablet (125 mcg total) by mouth daily.   guaiFENesin 600 MG 12 hr tablet Commonly known as:  MUCINEX Take 1 tablet (600 mg total) by mouth 2 (two) times daily.   metoprolol tartrate 50 MG tablet Commonly known as:  LOPRESSOR Take 1.5 tablets (75 mg total) by mouth 2 (two) times daily.   pantoprazole 40 MG tablet Commonly known as:  PROTONIX Take 1 tablet (40 mg total) daily by mouth.   polyethylene glycol packet Commonly known as:  MIRALAX / GLYCOLAX Take 17 g by mouth daily.   pravastatin 40 MG tablet Commonly known as:  PRAVACHOL Take 1 tablet (40 mg total) daily at 6 PM by mouth.   QUEtiapine 25 MG tablet Commonly known as:  SEROQUEL Take 1 tablet (25 mg total) 2 (two) times daily by mouth.      Follow-up Information    Stoneking, Hal, MD. Schedule an appointment as soon as possible for a visit in 1 week(s).   Specialty:  Internal Medicine Why:  as desired Contact information: [address] [place] [address] [phone]          Allergies  Allergen Reactions  . Augmentin [Amoxicillin-Pot Clavulanate] Other (See Comments)    On MAR   . Codeine Other (See Comments)    On MAR  . Cozaar [Losartan Potassium] Other (See Comments)    On MAR  . Evista [Raloxifene Hcl] Other (See Comments)    On MAR  . Flagyl [Metronidazole] Other (See Comments)    On MAR  . Iron Nausea And Vomiting    Loose stool  . Lexapro [Escitalopram Oxalate] Other (See Comments)    On MAR  . Tetracyclines & Related Other (See Comments)    On MAR  . Penicillins Rash    Has patient had a PCN reaction causing immediate rash, facial/tongue/throat swelling, SOB or lightheadedness with hypotension: No  Has patient had a PCN reaction causing severe rash involving mucus membranes or skin necrosis: No Has patient had a PCN reaction that required hospitalization No Has patient had a PCN reaction occurring within the last 10 years:  No If all of the above answers are "NO", then may proceed with Cephalosporin use.   Gaetana Michaelis Drugs Cross Reactors Rash    Consultations:   None  Other Procedures/Studies: Dg Chest 2 View  Result Date: 10/23/2017 CLINICAL DATA:  Pneumonia EXAM: CHEST  2 VIEW COMPARISON:  10/22/2017, 10/20/2012 FINDINGS: Bilateral mild interstitial thickening. No pleural effusion or pneumothorax. No focal consolidation. Stable cardiomegaly. Thoracic aortic atherosclerosis. Mild osteoarthritis of the left glenohumeral joint. Chronic midthoracic spine vertebral body compression fractures. IMPRESSION: Cardiomegaly with mild  pulmonary vascular congestion. Electronically Signed   By: Elige Ko   On: 10/23/2017 09:06   Dg Chest 2 View  Result Date: 10/22/2017 CLINICAL DATA:  Dyspnea and fever EXAM: CHEST  2 VIEW COMPARISON:  11/17/2014 chest radiograph. FINDINGS: Stable cardiomediastinal silhouette with mild cardiomegaly. No pneumothorax. No pleural effusion. Mild patchy right lower parahilar lung opacity. Multiple mid to lower thoracic and upper lumbar vertebral compression deformities appear chronic and stable. IMPRESSION: Mild patchy right lower parahilar lung opacity, which could represent atelectasis, aspiration or pneumonia. Recommend short-term follow-up PA and lateral chest radiographs. Mild cardiomegaly. Electronically Signed   By: Delbert Phenix M.D.   On: 10/22/2017 07:24   Dg Foot Complete Left  Result Date: 10/25/2017 CLINICAL DATA:  Left leg pain, altered mental status. EXAM: LEFT FOOT - COMPLETE 3+ VIEW COMPARISON:  None. FINDINGS: No acute osseous or joint abnormality.  Calcaneal spurs. IMPRESSION: No acute findings. Electronically Signed   By: Leanna Battles M.D.   On: 10/25/2017 14:08     TODAY-DAY OF DISCHARGE:  Subjective:   Connie Wagner today had a uneventful night-remains calm and quiet.  Sleeping comfortably when I walked in.  Follow simple commands.  Otherwise she is pleasantly  confused.  Objective:   Blood pressure 115/80, pulse 80, temperature 97.9 F (36.6 C), temperature source Oral, resp. rate 13, weight 73.9 kg (163 lb), SpO2 96 %.  Intake/Output Summary (Last 24 hours) at 10/30/2017 1241 Last data filed at 10/29/2017 2221 Gross per 24 hour  Intake 120 ml  Output 350 ml  Net -230 ml   Filed Weights   10/23/17 0513 10/27/17 0500 10/28/17 0500  Weight: 71.6 kg (157 lb 13.6 oz) 70.9 kg (156 lb 4.8 oz) 73.9 kg (163 lb)    Exam: Awake but pleasantly confused, No new F.N deficits, Normal affect [place].AT,PERRAL Supple Neck,No JVD, No cervical lymphadenopathy appriciated.  Symmetrical Chest wall movement, Good air movement bilaterally, CTAB RRR,No Gallops,Rubs or new Murmurs, No Parasternal Heave +ve B.Sounds, Abd Soft, Non tender, No organomegaly appriciated, No rebound -guarding or rigidity. No Cyanosis, Clubbing or edema, No new Rash or bruise   PERTINENT RADIOLOGIC STUDIES: Dg Chest 2 View  Result Date: 10/23/2017 CLINICAL DATA:  Pneumonia EXAM: CHEST  2 VIEW COMPARISON:  10/22/2017, 10/20/2012 FINDINGS: Bilateral mild interstitial thickening. No pleural effusion or pneumothorax. No focal consolidation. Stable cardiomegaly. Thoracic aortic atherosclerosis. Mild osteoarthritis of the left glenohumeral joint. Chronic midthoracic spine vertebral body compression fractures. IMPRESSION: Cardiomegaly with mild pulmonary vascular congestion. Electronically Signed   By: Elige Ko   On: 10/23/2017 09:06   Dg Chest 2 View  Result Date: 10/22/2017 CLINICAL DATA:  Dyspnea and fever EXAM: CHEST  2 VIEW COMPARISON:  11/17/2014 chest radiograph. FINDINGS: Stable cardiomediastinal silhouette with mild cardiomegaly. No pneumothorax. No pleural effusion. Mild patchy right lower parahilar lung opacity. Multiple mid to lower thoracic and upper lumbar vertebral compression deformities appear chronic and stable. IMPRESSION: Mild patchy right lower parahilar lung opacity, which  could represent atelectasis, aspiration or pneumonia. Recommend short-term follow-up PA and lateral chest radiographs. Mild cardiomegaly. Electronically Signed   By: Delbert Phenix M.D.   On: 10/22/2017 07:24   Dg Foot Complete Left  Result Date: 10/25/2017 CLINICAL DATA:  Left leg pain, altered mental status. EXAM: LEFT FOOT - COMPLETE 3+ VIEW COMPARISON:  None. FINDINGS: No acute osseous or joint abnormality.  Calcaneal spurs. IMPRESSION: No acute findings. Electronically Signed   By: Leanna Battles M.D.   On: 10/25/2017 14:08  PERTINENT LAB RESULTS: CBC: No results for input(s): WBC, HGB, HCT, PLT in the last 72 hours. CMET CMP     Component Value Date/Time   NA 139 10/30/2017 0813   K 3.9 10/30/2017 0813   CL 106 10/30/2017 0813   CO2 22 10/30/2017 0813   GLUCOSE 111 (H) 10/30/2017 0813   BUN 12 10/30/2017 0813   CREATININE 0.81 10/30/2017 0813   CALCIUM 8.5 (L) 10/30/2017 0813   PROT 7.0 07/04/2017 0423   ALBUMIN 3.5 07/04/2017 0423   AST 16 07/04/2017 0423   ALT 12 (L) 07/04/2017 0423   ALKPHOS 68 07/04/2017 0423   BILITOT 0.7 07/04/2017 0423   GFRNONAA >60 10/30/2017 0813   GFRAA >60 10/30/2017 0813    GFR Estimated Creatinine Clearance: 46 mL/min (by C-G formula based on SCr of 0.81 mg/dL). No results for input(s): LIPASE, AMYLASE in the last 72 hours. No results for input(s): CKTOTAL, CKMB, CKMBINDEX, TROPONINI in the last 72 hours. Invalid input(s): POCBNP No results for input(s): DDIMER in the last 72 hours. No results for input(s): HGBA1C in the last 72 hours. No results for input(s): CHOL, HDL, LDLCALC, TRIG, CHOLHDL, LDLDIRECT in the last 72 hours. No results for input(s): TSH, T4TOTAL, T3FREE, THYROIDAB in the last 72 hours.  Invalid input(s): FREET3 No results for input(s): VITAMINB12, FOLATE, FERRITIN, TIBC, IRON, RETICCTPCT in the last 72 hours. Coags: No results for input(s): INR in the last 72 hours.  Invalid input(s): PT Microbiology: Recent  Results (from the past 240 hour(s))  MRSA PCR Screening     Status: None   Collection Time: 10/22/17  8:45 PM  Result Value Ref Range Status   MRSA by PCR NEGATIVE NEGATIVE Final    Comment:        The GeneXpert MRSA Assay (FDA approved for NASAL specimens only), is one component of a comprehensive MRSA colonization surveillance program. It is not intended to diagnose MRSA infection nor to guide or monitor treatment for MRSA infections. Performed at [facility], [address] [place], [place] [address]     FURTHER DISCHARGE INSTRUCTIONS:  Get Medicines reviewed and adjusted: Please take all your medications with you for your next visit with your Primary MD  Laboratory/radiological data: Please request your Primary MD to go over all hospital tests and procedure/radiological results at the follow up, please ask your Primary MD to get all Hospital records sent to his/her office.  In some cases, they will be blood work, cultures and biopsy results pending at the time of your discharge. Please request that your primary care M.D. goes through all the records of your hospital data and follows up on these results.  Also Note the following: If you experience worsening of your admission symptoms, develop shortness of breath, life threatening emergency, suicidal or homicidal thoughts you must seek medical attention immediately by calling 911 or calling your MD immediately  if symptoms less severe.  You must read complete instructions/literature along with all the possible adverse reactions/side effects for all the Medicines you take and that have been prescribed to you. Take any new Medicines after you have completely understood and accpet all the possible adverse reactions/side effects.   Do not drive when taking Pain medications or sleeping medications (Benzodaizepines)  Do not take more than prescribed Pain, Sleep and Anxiety Medications. It is not advisable to combine  anxiety,sleep and pain medications without talking with your primary care practitioner  Special Instructions: If you have smoked or chewed Tobacco  in the last 2 yrs please stop smoking, stop any regular Alcohol  and or any Recreational drug use.  Wear Seat belts while driving.  Please note: You were cared for by a hospitalist during your hospital stay. Once you are discharged, your primary care physician will handle any further medical issues. Please note that NO REFILLS for any discharge medications will be authorized once you are discharged, as it is imperative that you return to your primary care physician (or establish a relationship with a primary care physician if you do not have one) for your post hospital discharge needs so that they can reassess your need for medications and monitor your lab values.  Total Time spent coordinating discharge including counseling, education and face to face time equals  45 minutes.  Signed: Susa Raring 10/30/2017 12:41 PM

## 2017-10-28 NOTE — Progress Notes (Signed)
CSW spoke with [name] from [facility].  [name] consulted with [employer] who stated " pt can return on Monday.  Facility can not take back pt with dementia on weekend.  CSW contacted Thayer Ohm from [facility].  Pt's was denied for [facility].  CSW contacted pt's daughter with updated information pt's daughter is out of state. CSW spoke with AD Wandra Mannan concerning not having a placement for pt.  AD Wandra Mannan asked CSW to have  [place] Supervisor to contact him at [phone].  CSW spoke with [name] at [facility] to contact AD.  CSW is currently seeking additional possibly placements for pt. Valarie Cones from [employer] will assess pt this evening.  CSW will follow up on Sunday. CSW left message for Tabitha from [facility] for possible placement.  Budd Palmer LCSWA [phone]

## 2017-10-29 DIAGNOSIS — F0151 Vascular dementia with behavioral disturbance: Secondary | ICD-10-CM

## 2017-10-29 DIAGNOSIS — J156 Pneumonia due to other aerobic Gram-negative bacteria: Secondary | ICD-10-CM

## 2017-10-29 MED ORDER — LORAZEPAM 2 MG/ML IJ SOLN
0.5000 mg | Freq: Once | INTRAMUSCULAR | Status: AC
  Administered 2017-10-29: 0.5 mg via INTRAVENOUS

## 2017-10-29 MED ORDER — HALOPERIDOL LACTATE 5 MG/ML IJ SOLN
2.0000 mg | Freq: Once | INTRAMUSCULAR | Status: AC
  Administered 2017-10-29: 2 mg via INTRAVENOUS
  Filled 2017-10-29: qty 1

## 2017-10-29 MED ORDER — METOPROLOL TARTRATE 50 MG PO TABS: 50.0000 mg | ORAL_TABLET | Freq: Two times a day (BID) | ORAL | Status: DC

## 2017-10-29 MED ORDER — LORAZEPAM 2 MG/ML IJ SOLN
INTRAMUSCULAR | Status: AC
  Filled 2017-10-29: qty 1

## 2017-10-29 MED ORDER — METOPROLOL TARTRATE 12.5 MG HALF TABLET
37.5000 mg | ORAL_TABLET | Freq: Three times a day (TID) | ORAL | Status: DC
  Administered 2017-10-29 – 2017-10-30 (×3): 37.5 mg via ORAL
  Filled 2017-10-29 (×3): qty 1

## 2017-10-29 MED ORDER — METOPROLOL TARTRATE 5 MG/5ML IV SOLN: 5.0000 mg | INTRAVENOUS | Status: DC | PRN

## 2017-10-29 MED ORDER — METOPROLOL TARTRATE 5 MG/5ML IV SOLN
5.0000 mg | INTRAVENOUS | Status: AC
  Administered 2017-10-29: 5 mg via INTRAVENOUS
  Filled 2017-10-29: qty 5

## 2017-10-29 NOTE — Progress Notes (Signed)
PROGRESS NOTE        PATIENT DETAILS Name: Connie Wagner Age: 84 y.o. Sex: female Date of Birth: [birth year] Admit Date: 10/22/2017 Admitting Physician Lahoma Crocker, MD GYK:[id number], Ann Maki, MD  Brief Narrative: Patient is a 84 y.o. female with history of dementia, CAD status post PCI in 2014, ischemic cardiomyopathy who presented to the hospital for cough, shortness of breath, thought to have pneumonia and admitted to the hospitalist service.  For the hospital course complicated by atrial fibrillation with RVR.  See below for further details  Subjective: Pleasantly confused-got agitated earlier this morning-heart rate briefly went up to the 120s.  Assessment/Plan: Healthcare associated pneumonia/right lobar pneumonia: Initially on broad-spectrum antimicrobial therapy-but with clinical improvement-has been transitioned to oral antimicrobial therapy-since patient has completed a course of antimicrobial therapy-we will go ahead and stop antibiotics on 3/3.  Clinically improved-remains afebrile without leukocytosis.  Influenza PCR was negative.  Note chest x-ray reviewed by me personally this morning.  Atrial fibrillation with RVR: Cardizem drip was initiated-this was discontinued on 3/2-but heart rate seems to be creeping up again-when patient was agitated this morning it was in the 120s-increase Lopressor to 37.5 mg p.o. 3 times daily.  If heart rate is uncontrolled with oral medications-may need to resume Cardizem infusion again.Chads 2 Vas score of at least 6-not anticoagulation candidate given advanced age, frailty-and prior history of GI bleeding.  Remains on aspirin.  Hypokalemia: Repleted  History of CAD/CVA: Currently with no anginal symptoms-neurological exam is unchanged--continue aspirin, statin and beta-blocker.  GERD: Continue PPI  Dyslipidemia: Continue statin  Dementia with delirium: Somewhat more confused-briefly agitated this morning-continue  Seroquel.    Telemetry (independently reviewed): A. fib with rate occasionally in the 120s this morning.Marland Kitchen  DVT Prophylaxis: Prophylactic Lovenox   Code Status:  DNR   Family Communication: None at bedside  Disposition Plan: Remain inpatient-SNF on discharge-hopefully on 3/4  Antimicrobial agents: Anti-infectives (From admission, onward)   Start     Dose/Rate Route Frequency Ordered Stop   10/28/17 0000  cefUROXime (CEFTIN) 500 MG tablet     500 mg Oral 2 times daily with meals 10/28/17 0955     10/25/17 1700  cefUROXime (CEFTIN) tablet 500 mg    Comments:  Tolerated IV   500 mg Oral 2 times daily with meals 10/25/17 1549     10/23/17 0000  levofloxacin (LEVAQUIN) 500 MG tablet  Status:  Discontinued     500 mg Oral Daily 10/22/17 0917 10/28/17    10/22/17 1330  ceFEPIme (MAXIPIME) 1 g in sodium chloride 0.9 % 100 mL IVPB  Status:  Discontinued     1 g 200 mL/hr over 30 Minutes Intravenous Every 12 hours 10/22/17 1201 10/25/17 1547   10/22/17 0830  levofloxacin (LEVAQUIN) IVPB 500 mg     500 mg 100 mL/hr over 60 Minutes Intravenous  Once 10/22/17 0748 10/22/17 1055      Procedures: None  CONSULTS:  None  Time spent: 25-minutes-Greater than 50% of this time was spent in counseling, explanation of diagnosis, planning of further management, and coordination of care.  MEDICATIONS: Scheduled Meds: . acetaminophen  650 mg Oral Q6H  . aspirin  81 mg Oral Daily  . cefUROXime  500 mg Oral BID WC  . diclofenac sodium  2 g Topical QID  . docusate sodium  100 mg Oral BID  .  enoxaparin (LOVENOX) injection  40 mg Subcutaneous Q24H  . glycopyrrolate  0.4 mg Intravenous Q6H  . guaiFENesin  600 mg Oral BID  . metoprolol tartrate  50 mg Oral BID  . pantoprazole  40 mg Oral Daily  . polyethylene glycol  17 g Oral Daily  . pravastatin  40 mg Oral q1800  . QUEtiapine  25 mg Oral BID   Continuous Infusions: . diltiazem (CARDIZEM) infusion Stopped (10/28/17 0802)   PRN  Meds:.antiseptic oral rinse, benzonatate, bisacodyl, hydrALAZINE, levalbuterol, metoprolol tartrate, polyvinyl alcohol   PHYSICAL EXAM: Vital signs: Vitals:   10/28/17 2159 10/29/17 0022 10/29/17 0155 10/29/17 0839  BP: 111/87 (!) 141/115 102/62 (!) 132/99  Pulse: 90   95  Resp:    20  Temp:    (!) 97.4 F (36.3 C)  TempSrc:    Axillary  SpO2:    98%  Weight:       Filed Weights   10/23/17 0513 10/27/17 0500 10/28/17 0500  Weight: 71.6 kg (157 lb 13.6 oz) 70.9 kg (156 lb 4.8 oz) 73.9 kg (163 lb)   Body mass index is 29.81 kg/m.   General appearance :Awake, confused.  Lying comfortably in bed. Eyes:, pupils equally reactive to light and accomodation,no scleral icterus. HEENT: Atraumatic and Normocephalic Neck: supple, no JVD. Resp:Good air entry bilaterally, no added sounds CVS: S1 S2 irregular  GI: Bowel sounds present, Non tender and not distended with no gaurding, rigidity or rebound. Extremities: B/L Lower Ext shows no edema, both legs are warm to touch Neurology: Moves all 4 extremities Musculoskeletal:No digital cyanosis Skin:No Rash, warm and dry Wounds:N/A  I have personally reviewed following labs and imaging studies  LABORATORY DATA: CBC: Recent Labs  Lab 10/23/17 0504 10/27/17 0741  WBC 6.2 8.4  HGB 10.8* 10.3*  HCT 35.9* 33.2*  MCV 90.4 87.8  PLT 146* 194    Basic Metabolic Panel: Recent Labs  Lab 10/23/17 0504 10/27/17 0741 10/28/17 0331  NA 140 139 140  K 3.5 3.3* 4.0  CL 110 106 105  CO2 22 23 24   GLUCOSE 98 128* 114*  BUN 13 16 16   CREATININE 0.73 0.77 0.91  CALCIUM 8.1* 8.4* 8.8*  MG  --  2.2  --     GFR: Estimated Creatinine Clearance: 41 mL/min (by C-G formula based on SCr of 0.91 mg/dL).  Liver Function Tests: No results for input(s): AST, ALT, ALKPHOS, BILITOT, PROT, ALBUMIN in the last 168 hours. No results for input(s): LIPASE, AMYLASE in the last 168 hours. No results for input(s): AMMONIA in the last 168  hours.  Coagulation Profile: No results for input(s): INR, PROTIME in the last 168 hours.  Cardiac Enzymes: No results for input(s): CKTOTAL, CKMB, CKMBINDEX, TROPONINI in the last 168 hours.  BNP (last 3 results) No results for input(s): PROBNP in the last 8760 hours.  HbA1C: No results for input(s): HGBA1C in the last 72 hours.  CBG: No results for input(s): GLUCAP in the last 168 hours.  Lipid Profile: No results for input(s): CHOL, HDL, LDLCALC, TRIG, CHOLHDL, LDLDIRECT in the last 72 hours.  Thyroid Function Tests: No results for input(s): TSH, T4TOTAL, FREET4, T3FREE, THYROIDAB in the last 72 hours.  Anemia Panel: No results for input(s): VITAMINB12, FOLATE, FERRITIN, TIBC, IRON, RETICCTPCT in the last 72 hours.  Urine analysis:    Component Value Date/Time   COLORURINE YELLOW 10/22/2017 0648   APPEARANCEUR CLEAR 10/22/2017 0648   LABSPEC 1.017 10/22/2017 0648   PHURINE 5.0 10/22/2017 3382  GLUCOSEU NEGATIVE 10/22/2017 0648   HGBUR LARGE (A) 10/22/2017 0648   BILIRUBINUR NEGATIVE 10/22/2017 0648   KETONESUR 5 (A) 10/22/2017 0648   PROTEINUR 30 (A) 10/22/2017 0648   UROBILINOGEN 0.2 06/08/2015 1700   NITRITE NEGATIVE 10/22/2017 0648   LEUKOCYTESUR NEGATIVE 10/22/2017 0648    Sepsis Labs: Lactic Acid, Venous    Component Value Date/Time   LATICACIDVEN 0.98 10/22/2017 0904    MICROBIOLOGY: Recent Results (from the past 240 hour(s))  MRSA PCR Screening     Status: None   Collection Time: 10/22/17  8:45 PM  Result Value Ref Range Status   MRSA by PCR NEGATIVE NEGATIVE Final    Comment:        The GeneXpert MRSA Assay (FDA approved for NASAL specimens only), is one component of a comprehensive MRSA colonization surveillance program. It is not intended to diagnose MRSA infection nor to guide or monitor treatment for MRSA infections. Performed at [facility], [address] [place], [place] [address]     RADIOLOGY STUDIES/RESULTS: Dg Chest  2 View  Result Date: 10/23/2017 CLINICAL DATA:  Pneumonia EXAM: CHEST  2 VIEW COMPARISON:  10/22/2017, 10/20/2012 FINDINGS: Bilateral mild interstitial thickening. No pleural effusion or pneumothorax. No focal consolidation. Stable cardiomegaly. Thoracic aortic atherosclerosis. Mild osteoarthritis of the left glenohumeral joint. Chronic midthoracic spine vertebral body compression fractures. IMPRESSION: Cardiomegaly with mild pulmonary vascular congestion. Electronically Signed   By: Elige Ko   On: 10/23/2017 09:06   Dg Chest 2 View  Result Date: 10/22/2017 CLINICAL DATA:  Dyspnea and fever EXAM: CHEST  2 VIEW COMPARISON:  11/17/2014 chest radiograph. FINDINGS: Stable cardiomediastinal silhouette with mild cardiomegaly. No pneumothorax. No pleural effusion. Mild patchy right lower parahilar lung opacity. Multiple mid to lower thoracic and upper lumbar vertebral compression deformities appear chronic and stable. IMPRESSION: Mild patchy right lower parahilar lung opacity, which could represent atelectasis, aspiration or pneumonia. Recommend short-term follow-up PA and lateral chest radiographs. Mild cardiomegaly. Electronically Signed   By: Delbert Phenix M.D.   On: 10/22/2017 07:24   Dg Foot Complete Left  Result Date: 10/25/2017 CLINICAL DATA:  Left leg pain, altered mental status. EXAM: LEFT FOOT - COMPLETE 3+ VIEW COMPARISON:  None. FINDINGS: No acute osseous or joint abnormality.  Calcaneal spurs. IMPRESSION: No acute findings. Electronically Signed   By: Leanna Battles M.D.   On: 10/25/2017 14:08     LOS: 7 days   Jeoffrey Massed, MD  [facility] Pager:[phone]  If 7PM-7AM, please contact night-coverage [email or web address] Password [facility] 10/29/2017, 11:32 AM

## 2017-10-29 NOTE — Progress Notes (Signed)
Pt with elevated hr in a.fib. Spoke with provider. Pt placed on tele and given 5mg  of lopressor iv push. Pt was checked again with hr still elevated above 120 sustaining. Pt placed on cardizem at 36ml/hr to be titrated per parameters.

## 2017-10-29 NOTE — Progress Notes (Addendum)
CSW spoke with [name] at [facility] concerning pt's placement there.  [name] will [employer], [employer] to contact CSW for verification of  pt's placement for Monday.  CSW spoke with pt's daughter Connie Wagner.  Pt's daughter would like for pt to return to [facility].  Pt's daughter mention that [name] told her on Saturday that her mother could return to [facility] on Monday.   CSW spoke with [facility] concerning pt's return to [facility] on Monday.  Pt will be able to return tomorrow.

## 2017-10-30 ENCOUNTER — Emergency Department (HOSPITAL_COMMUNITY): Payer: Medicare HMO

## 2017-10-30 ENCOUNTER — Emergency Department (HOSPITAL_COMMUNITY)
Admission: EM | Admit: 2017-10-30 | Discharge: 2017-10-31 | Disposition: A | Discharge status: Skilled Nursing Facility | Payer: Medicare HMO | Attending: Emergency Medicine | Admitting: Emergency Medicine

## 2017-10-30 ENCOUNTER — Encounter (HOSPITAL_COMMUNITY): Payer: Self-pay | Admitting: Emergency Medicine

## 2017-10-30 ENCOUNTER — Other Ambulatory Visit: Payer: Self-pay

## 2017-10-30 DIAGNOSIS — Y9389 Activity, other specified: Secondary | ICD-10-CM | POA: Insufficient documentation

## 2017-10-30 DIAGNOSIS — F0390 Unspecified dementia without behavioral disturbance: Secondary | ICD-10-CM | POA: Insufficient documentation

## 2017-10-30 DIAGNOSIS — Z79899 Other long term (current) drug therapy: Secondary | ICD-10-CM | POA: Insufficient documentation

## 2017-10-30 DIAGNOSIS — W19XXXA Unspecified fall, initial encounter: Secondary | ICD-10-CM | POA: Insufficient documentation

## 2017-10-30 DIAGNOSIS — J189 Pneumonia, unspecified organism: Secondary | ICD-10-CM

## 2017-10-30 DIAGNOSIS — I4891 Unspecified atrial fibrillation: Secondary | ICD-10-CM

## 2017-10-30 DIAGNOSIS — Z7982 Long term (current) use of aspirin: Secondary | ICD-10-CM | POA: Insufficient documentation

## 2017-10-30 DIAGNOSIS — R69 Illness, unspecified: Secondary | ICD-10-CM | POA: Diagnosis not present

## 2017-10-30 DIAGNOSIS — I10 Essential (primary) hypertension: Secondary | ICD-10-CM

## 2017-10-30 DIAGNOSIS — I259 Chronic ischemic heart disease, unspecified: Secondary | ICD-10-CM | POA: Diagnosis not present

## 2017-10-30 DIAGNOSIS — Y999 Unspecified external cause status: Secondary | ICD-10-CM | POA: Insufficient documentation

## 2017-10-30 DIAGNOSIS — I2510 Atherosclerotic heart disease of native coronary artery without angina pectoris: Secondary | ICD-10-CM

## 2017-10-30 DIAGNOSIS — S0990XA Unspecified injury of head, initial encounter: Secondary | ICD-10-CM | POA: Diagnosis not present

## 2017-10-30 DIAGNOSIS — Y92128 Other place in nursing home as the place of occurrence of the external cause: Secondary | ICD-10-CM | POA: Insufficient documentation

## 2017-10-30 DIAGNOSIS — S199XXA Unspecified injury of neck, initial encounter: Secondary | ICD-10-CM | POA: Diagnosis not present

## 2017-10-30 LAB — CBC WITH DIFFERENTIAL/PLATELET
Basophils Absolute: 0 10*3/uL (ref 0.0–0.1)
Basophils Relative: 0 %
Eosinophils Absolute: 0.3 10*3/uL (ref 0.0–0.7)
Eosinophils Relative: 3 %
HEMATOCRIT: 36.6 % (ref 36.0–46.0)
HEMOGLOBIN: 11.6 g/dL — AB (ref 12.0–15.0)
LYMPHS ABS: 1.1 10*3/uL (ref 0.7–4.0)
LYMPHS PCT: 11 %
MCH: 27.9 pg (ref 26.0–34.0)
MCHC: 31.7 g/dL (ref 30.0–36.0)
MCV: 88 fL (ref 78.0–100.0)
Monocytes Absolute: 0.7 10*3/uL (ref 0.1–1.0)
Monocytes Relative: 7 %
NEUTROS ABS: 7.9 10*3/uL — AB (ref 1.7–7.7)
NEUTROS PCT: 79 %
Platelets: 261 10*3/uL (ref 150–400)
RBC: 4.16 MIL/uL (ref 3.87–5.11)
RDW: 16.3 % — ABNORMAL HIGH (ref 11.5–15.5)
WBC: 10 10*3/uL (ref 4.0–10.5)

## 2017-10-30 LAB — BASIC METABOLIC PANEL
Anion gap: 11 (ref 5–15)
Anion gap: 11 (ref 5–15)
BUN: 12 mg/dL (ref 6–20)
BUN: 12 mg/dL (ref 6–20)
CALCIUM: 8.5 mg/dL — AB (ref 8.9–10.3)
CHLORIDE: 106 mmol/L (ref 101–111)
CO2: 22 mmol/L (ref 22–32)
CO2: 24 mmol/L (ref 22–32)
CREATININE: 0.81 mg/dL (ref 0.44–1.00)
Calcium: 8.8 mg/dL — ABNORMAL LOW (ref 8.9–10.3)
Chloride: 106 mmol/L (ref 101–111)
Creatinine, Ser: 0.75 mg/dL (ref 0.44–1.00)
GFR calc non Af Amer: >60 mL/min (ref >60)
Glucose, Bld: 111 mg/dL — ABNORMAL HIGH (ref 65–99)
Glucose, Bld: 94 mg/dL (ref 65–99)
POTASSIUM: 3.9 mmol/L (ref 3.5–5.1)
Potassium: 3.9 mmol/L (ref 3.5–5.1)
SODIUM: 139 mmol/L (ref 135–145)
SODIUM: 141 mmol/L (ref 135–145)

## 2017-10-30 LAB — MAGNESIUM: MAGNESIUM: 2 mg/dL (ref 1.7–2.4)

## 2017-10-30 MED ORDER — METOPROLOL TARTRATE 50 MG PO TABS: 75.0000 mg | ORAL_TABLET | Freq: Two times a day (BID) | ORAL | 11 refills | Status: DC

## 2017-10-30 MED ORDER — METOPROLOL TARTRATE 25 MG PO TABS
25.0000 mg | ORAL_TABLET | Freq: Once | ORAL | Status: AC
  Administered 2017-10-30: 25 mg via ORAL

## 2017-10-30 MED ORDER — DIGOXIN 125 MCG PO TABS: 125.0000 ug | ORAL_TABLET | Freq: Every day | ORAL | 11 refills | Status: DC

## 2017-10-30 MED ORDER — METOPROLOL TARTRATE 50 MG PO TABS
50.0000 mg | ORAL_TABLET | Freq: Two times a day (BID) | ORAL | Status: DC
  Administered 2017-10-30: 50 mg via ORAL

## 2017-10-30 MED ORDER — METOPROLOL TARTRATE 50 MG PO TABS
75.0000 mg | ORAL_TABLET | Freq: Two times a day (BID) | ORAL | Status: DC
  Filled 2017-10-30: qty 1

## 2017-10-30 MED ORDER — SODIUM CHLORIDE 0.9 % IV BOLUS (SEPSIS)
250.0000 mL | Freq: Once | INTRAVENOUS | Status: AC
  Administered 2017-10-30: 250 mL via INTRAVENOUS

## 2017-10-30 MED ORDER — METOPROLOL TARTRATE 25 MG PO TABS
75.0000 mg | ORAL_TABLET | Freq: Once | ORAL | Status: AC
  Administered 2017-10-31: 75 mg via ORAL
  Filled 2017-10-30: qty 3

## 2017-10-30 MED ORDER — DIGOXIN 0.25 MG/ML IJ SOLN
0.1250 mg | Freq: Four times a day (QID) | INTRAMUSCULAR | Status: AC
  Administered 2017-10-30 (×2): 0.125 mg via INTRAVENOUS
  Filled 2017-10-30 (×2): qty 2

## 2017-10-30 MED ORDER — DILTIAZEM HCL 25 MG/5ML IV SOLN
5.0000 mg | Freq: Once | INTRAVENOUS | Status: AC
Start: 2017-10-30 — End: 2017-10-30
  Administered 2017-10-30: 5 mg via INTRAVENOUS
  Filled 2017-10-30: qty 5

## 2017-10-30 NOTE — Discharge Instructions (Signed)
Follow with Primary MD Merlene Laughter, MD in 7 days   Get CBC, CMP, 2 view Chest X ray checked  by Primary MD or SNF MD in 5-7 days   Activity: As tolerated with Full fall precautions use walker/cane & assistance as needed  Disposition SNF  Diet:   DIET DYS 2 with feeding assistance and aspiration precautions.  For Heart failure patients - Check your Weight same time everyday, if you gain over 2 pounds, or you develop in leg swelling, experience more shortness of breath or chest pain, call your Primary MD immediately. Follow Cardiac Low Salt Diet and 1.5 lit/day fluid restriction.  Special Instructions: If you have smoked or chewed Tobacco  in the last 2 yrs please stop smoking, stop any regular Alcohol  and or any Recreational drug use.  On your next visit with your primary care physician please Get Medicines reviewed and adjusted.  Please request your Prim.MD to go over all Hospital Tests and Procedure/Radiological results at the follow up, please get all Hospital records sent to your Prim MD by signing hospital release before you go home.  If you experience worsening of your admission symptoms, develop shortness of breath, life threatening emergency, suicidal or homicidal thoughts you must seek medical attention immediately by calling 911 or calling your MD immediately  if symptoms less severe.  You Must read complete instructions/literature along with all the possible adverse reactions/side effects for all the Medicines you take and that have been prescribed to you. Take any new Medicines after you have completely understood and accpet all the possible adverse reactions/side effects.   Do not drive, operate heavy machinery, perform activities at heights, swimming or participation in water activities or provide baby sitting services if your were admitted for syncope or siezures until you have seen by Primary MD or a Neurologist and advised to do so again.  Do not drive when taking Pain  medications.    Do not take more than prescribed Pain, Sleep and Anxiety Medications  Wear Seat belts while driving.   Please note  You were cared for by a hospitalist during your hospital stay. If you have any questions about your discharge medications or the care you received while you were in the hospital after you are discharged, you can call the unit and asked to speak with the hospitalist on call if the hospitalist that took care of you is not available. Once you are discharged, your primary care physician will handle any further medical issues. Please note that NO REFILLS for any discharge medications will be authorized once you are discharged, as it is imperative that you return to your primary care physician (or establish a relationship with a primary care physician if you do not have one) for your aftercare needs so that they can reassess your need for medications and monitor your lab values.

## 2017-10-30 NOTE — ED Notes (Signed)
Bed: [facility] Expected date:  Expected time:  Means of arrival:  Comments: 

## 2017-10-30 NOTE — Progress Notes (Signed)
Patient will DC to: [facility] Anticipated DC date: 10/30/17 Family notified: Daughter Transport by: Sharin Mons   Per MD patient ready for DC to [facility]. RN, patient, patient's family, and facility notified of DC. Discharge Summary sent to facility. RN given number for report [phone]). DC packet on chart. Ambulance transport requested for patient.   CSW signing off.  Cristobal Goldmann, LCSW Clinical Social Worker [phone]

## 2017-10-30 NOTE — ED Provider Notes (Signed)
[facility]-EMERGENCY DEPT Provider Note   CSN: [id number] Arrival date & time: 10/30/17  1711     History   Chief Complaint Chief Complaint  Patient presents with  . Fall    HPI Connie Wagner is a 84 y.o. female who presents with an unwittnessed fall and possible head injury. PMH significant for dementia, A.fib, CAD. She resides at [facility] at [facility]. She was sent here for a reported fall. Her nurse at [facility] states that the patient was sitting at a table with other residents doing a puzzle and she was in another room. She heard yelling and went out to check and found the patient on the ground. She didn't see any injuries but when she touched the patient's head she said "ow" so she felt like she had to send her back to the ED for an assessment. The patient cannot recall falling and doesn't know where she is. When asked where she is she states "I'm here". She was recently admitted for PNA with hypoxia and was discharged two days ago. When asked how she is doing she states she feels great. She denies any pain complaints. She is not on blood thinners due to frequent falls.  LEVEL 5 CAVEAT due to dementia.  HPI  Past Medical History:  Diagnosis Date  . Anemia    iron deficient  . Anxiety   . Atrial fibrillation (HCC)    per daughter  . CAD (coronary artery disease)    a. s/p Inf STEMI 2/14 => LHC 10/18/12: Diffuse distal LM 30%, LAD 50% calcified stenosis just after first septal perforator, diagonal branches with mild 20-30% stenosis, mid RCA 99%. PCI: Promus Premier DES to the mid RCA  . Carotid stenosis    a. [employer] screening done in 2009 with carotid stenosis noted;  b.  Carotid dopplers 3/14: R 0-39%, L 40-59%, repeat 1 year  . Chronic lower back pain   . 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 Dementia    needs redirection freq.  . Depression   . Hypertension   .  Ischemic cardiomyopathy    a. Echocardiogram 10/18/12: Mild LVH, inferior and inferoseptal AK, posterior HK, EF 45%, grade 1 diastolic dysfunction, aortic sclerosis without stenosis, mild MR, mild LAE, RV systolic function moderately reduced (pacer wire in right ventricle), PASP 32  . Migraines    "haven't had one since going thru menopause"  . Rheumatoid arthritis(714.0)   . Upper GI bleeding 09/14/11   "have had it once before today"    Patient Active Problem List   Diagnosis Date Noted  . Atrial fibrillation with RVR (HCC) 10/26/2017  . Wheezing   . Weakness   . Palliative care by specialist   . Goals of care, counseling/discussion   . Acute pneumonia 10/22/2017  . Shortness of breath 10/22/2017  . Carotid stenosis, bilateral 07/05/2017  . Cerebral embolism with cerebral infarction 07/04/2017  . Stroke (cerebrum) (HCC) 07/04/2017  . Physical deconditioning 10/23/2012  . Cardiogenic shock (HCC) 10/23/2012  . Wenckebach second degree AV block 10/20/2012  . Complete heart block (HCC) 10/19/2012  . Acute renal failure (HCC) 10/19/2012  . Acute lower UTI 10/19/2012  . Acute systolic CHF (congestive heart failure) (HCC) 10/19/2012  . Normocytic anemia 10/19/2012  . S/P drug eluting coronary stent placement 10/19/2012  . Coronary artery disease 10/19/2012  . Acute MI, inferior wall, initial episode of care (HCC) 10/18/2012  . Urinary  tract infection, E. coli 08/21/2012  . Dementia   . Depression   . Prepyloric ulcer 09/15/2011  . Hypotension due to blood loss 09/15/2011  . Leukocytosis 09/15/2011  . Extrarenal azotemia 09/15/2011  . GI bleeding 09/14/2011  . Anemia associated with acute blood loss 09/14/2011  . Tachycardia 09/14/2011  . HTN (hypertension) 09/14/2011  . Anxiety 09/14/2011  . ETOH abuse 09/14/2011  . 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 09/14/2011    Past Surgical History:  Procedure  Laterality Date  . BACK SURGERY    . BREAST LUMPECTOMY     "benign tumor removed"  . ESOPHAGOGASTRODUODENOSCOPY  09/15/2011   Procedure: ESOPHAGOGASTRODUODENOSCOPY (EGD);  Surgeon: Barrie Folk, MD;  Location: [facility] ENDOSCOPY;  Service: Endoscopy;  Laterality: N/A;  . HARVEST BONE GRAFT  08/17/2012   Procedure: HARVEST ILIAC BONE GRAFT;  Surgeon: Eulas Post, MD;  Location: [facility] OR;  Service: Orthopedics;  Laterality: Right;  . HEMATOMA EVACUATION  10/2002   lumbar  . INCISION AND DRAINAGE OF WOUND  10/2002   lumbar wound infection  . JOINT REPLACEMENT    . KNEE ARTHROSCOPY  01/2001   right  . LEFT HEART CATHETERIZATION WITH CORONARY ANGIOGRAM N/A 10/18/2012   Procedure: LEFT HEART CATHETERIZATION WITH CORONARY ANGIOGRAM;  Surgeon: Tonny Bollman, MD;  Location: [facility] CATH LAB;  Service: Cardiovascular;  Laterality: N/A;  . LUMBAR DISC SURGERY  09/2002   Hemilaminectomy, L3, complete laminectomy, L4, hemi- semilaminectomy, L5 and right S1, and diskectomy, L5-S1.  Marland Kitchen LUMBAR LAMINECTOMY  10/2003   for epidural abscess evacuation; L4-5  . ORIF HUMERUS FRACTURE  08/17/2012   Procedure: OPEN REDUCTION INTERNAL FIXATION (ORIF) DISTAL HUMERUS FRACTURE;  Surgeon: Eulas Post, MD;  Location: [facility] OR;  Service: Orthopedics;  Laterality: Right;  ORIF RIGHT SUPRACHONDULAR HUMERUS FRACTURE  . TOTAL KNEE ARTHROPLASTY  01/2002   right    OB History    Gravida Para Term Preterm AB Living   0 0 0 0 0 0   SAB TAB Ectopic Multiple Live Births   0 0 0 0         Home Medications    Prior to Admission medications   Medication Sig Start Date End Date Taking? Authorizing Provider  acetaminophen (TYLENOL) 325 MG tablet Take 2 tablets (650 mg total) every 6 (six) hours as needed by mouth for mild pain or fever. 07/06/17   Beryl Meager, NP  aspirin EC 81 MG EC tablet Take 1 tablet (81 mg total) daily by mouth. 07/06/17   Beryl Meager, NP  benzonatate (TESSALON) 200 MG capsule Take 1 capsule (200 mg total)  by mouth 3 (three) times daily as needed for cough. 10/28/17   Ghimire, Werner Lean, MD  cefUROXime (CEFTIN) 500 MG tablet Take 1 tablet (500 mg total) by mouth 2 (two) times daily with a meal. Foe 2 more days from 3/2 and then stop 10/28/17   Ghimire, Werner Lean, MD  diclofenac sodium (VOLTAREN) 1 % GEL Apply 2 g topically 4 (four) times daily. 10/28/17   Ghimire, Werner Lean, MD  digoxin (LANOXIN) 0.125 MG tablet Take 1 tablet (125 mcg total) by mouth daily. 10/30/17 10/30/18  Leroy Sea, MD  guaiFENesin (MUCINEX) 600 MG 12 hr tablet Take 1 tablet (600 mg total) by mouth 2 (two) times daily. 10/28/17   Ghimire, Werner Lean, MD  metoprolol tartrate (LOPRESSOR) 50 MG tablet Take 1.5 tablets (75 mg total) by mouth 2 (  two) times daily. 10/30/17 10/30/18  Leroy Sea, MD  pantoprazole (PROTONIX) 40 MG tablet Take 1 tablet (40 mg total) daily by mouth. Patient not taking: Reported on 10/22/2017 07/06/17   Beryl Meager, NP  polyethylene glycol (MIRALAX / GLYCOLAX) packet Take 17 g by mouth daily. 10/28/17   Ghimire, Werner Lean, MD  pravastatin (PRAVACHOL) 40 MG tablet Take 1 tablet (40 mg total) daily at 6 PM by mouth. 07/06/17   Beryl Meager, NP  QUEtiapine (SEROQUEL) 25 MG tablet Take 1 tablet (25 mg total) 2 (two) times daily by mouth. Patient not taking: Reported on 10/22/2017 07/06/17   Beryl Meager, NP    Family History Family History  Problem Relation Age of Onset  . Heart attack Mother   . Stroke Mother   . Hypertension Sister   . Anesthesia problems Neg Hx   . Hypotension Neg Hx   . Malignant hyperthermia Neg Hx   . Pseudochol deficiency Neg Hx     Social History Social History   Tobacco Use  . Smoking status: Never Smoker  . Smokeless tobacco: Never Used  Substance Use Topics  . Alcohol use: Yes    Alcohol/week: 3.0 oz    Types: 5 Glasses of wine per week  . Drug use: No     Allergies   Augmentin [amoxicillin-pot clavulanate]; Codeine; Cozaar [losartan potassium]; Evista  [raloxifene hcl]; Flagyl [metronidazole]; Iron; Lexapro [escitalopram oxalate]; Tetracyclines & related; Penicillins; and Sulfa drugs cross reactors   Review of Systems Review of Systems  Unable to perform ROS: Dementia     Physical Exam Updated Vital Signs BP (!) 147/76 (BP Location: Left Arm)   Pulse 65   Temp 98.3 F (36.8 C) (Oral)   Resp 16   SpO2 96%   Physical Exam  Constitutional: She is oriented to person, place, and time. She appears well-developed and well-nourished. No distress.  HENT:  Head: Normocephalic and atraumatic.  No obvious injuries  Eyes: Conjunctivae are normal. Pupils are equal, round, and reactive to light. Right eye exhibits no discharge. Left eye exhibits no discharge. No scleral icterus.  Neck: Normal range of motion.  No midline tenderness  Cardiovascular: Normal rate and regular rhythm.  Pulmonary/Chest: Effort normal and breath sounds normal. No respiratory distress.  Abdominal: Soft. Bowel sounds are normal. She exhibits no distension. There is no tenderness.  Musculoskeletal:  Moves all extremities without pain  Neurological: She is alert and oriented to person, place, and time.  Skin: Skin is warm and dry.  Psychiatric: She has a normal mood and affect. Her behavior is normal.  Nursing note and vitals reviewed.    ED Treatments / Results  Labs (all labs ordered are listed, but only abnormal results are displayed) Labs Reviewed  BASIC METABOLIC PANEL - Abnormal; Notable for the following components:      Result Value   Calcium 8.8 (*)    All other components within normal limits  CBC WITH DIFFERENTIAL/PLATELET - Abnormal; Notable for the following components:   Hemoglobin 11.6 (*)    RDW 16.3 (*)    Neutro Abs 7.9 (*)    All other components within normal limits    EKG  EKG Interpretation  Date/Time:  Monday October 30 2017 22:53:23 EST Ventricular Rate:  127 PR Interval:    QRS Duration: 86 QT Interval:  336 QTC  Calculation: 489 R Axis:   26 Text Interpretation:  Atrial fibrillation Abnormal inferior Q waves Repolarization abnormality, prob rate related  Baseline wander in lead(s) V1 Confirmed by Loren Racer ([id number]) on 10/30/2017 10:57:00 PM       Radiology Ct Head Wo Contrast  Result Date: 10/30/2017 CLINICAL DATA:  Unwitnessed fall backwards. No loss of consciousness. History of atrial fibrillation, dementia, hypertension. EXAM: CT HEAD WITHOUT CONTRAST CT CERVICAL SPINE WITHOUT CONTRAST TECHNIQUE: Multidetector CT imaging of the head and cervical spine was performed following the standard protocol without intravenous contrast. Multiplanar CT image reconstructions of the cervical spine were also generated. COMPARISON:  None. CT HEAD July 05, 2017 and CT angiogram of the neck July 04, 2017 FINDINGS: CT HEAD FINDINGS BRAIN: No intraparenchymal hemorrhage, mass effect nor midline shift. Severe ventriculomegaly with disproportionate sulcal effacement at the convexities associated with narrowed callosal angle and lobulated ventricular contour. Patchy to confluent supratentorial white matter hypodensities. No acute large vascular territory infarcts. No abnormal extra-axial fluid collections. Basal cisterns are patent. VASCULAR: Moderate to severe calcific atherosclerosis of the carotid siphons. SKULL: No skull fracture. Severe LEFT temporomandibular osteoarthrosis. Old mildly depressed distal nasal bone fracture. No significant scalp soft tissue swelling. SINUSES/ORBITS: The mastoid air-cells and included paranasal sinuses are well-aerated.The included ocular globes and orbital contents are non-suspicious. OTHER: None. CT CERVICAL SPINE FINDINGS ALIGNMENT: Straightened cervical lordosis. Stable grade 1 C3-4 anterolisthesis. SKULL BASE AND VERTEBRAE: Vertebral bodies intact. Severe RIGHT upper cervical facet arthropathy with chronically fragmented RIGHT C3 inferior articular facet. Severe site 5 6 and C6-7 disc  height loss with endplate sclerosis and marginal spurring compatible with degenerative discs. C1-2 articulation maintained with calcified pannus about the odontoid process seen with reported rheumatoid arthritis and, CPPD without basilar invagination. Osteopenia. SOFT TISSUES AND SPINAL CANAL: Nonacute. Severe calcific atherosclerosis bilateral carotid siphons previously interrogated by CTA NECK July 04, 2017. DISC LEVELS: Mild to moderate canal stenosis C5-6 and mild canal stenosis C6-7. Severe RIGHT C3-4, LEFT C5-6 neural foraminal narrowing. Moderate to severe RIGHT C5-6 and C6-7 neural foraminal narrowing. UPPER CHEST: Lung apices are clear. OTHER: None. IMPRESSION: CT HEAD: 1. No acute intracranial process. 2. Moderate to severe parenchymal brain volume loss with superimposed suspected normal pressure hydrocephalus. 3. Moderate to severe chronic small vessel ischemic disease. CT CERVICAL SPINE: 1. No acute fracture. Stable grade 1 C3-4 anterolisthesis on degenerative basis. 2. Severe C3-4 and C5-6 neural foraminal narrowing. Electronically Signed   By: Awilda Metro M.D.   On: 10/30/2017 21:22   Ct Cervical Spine Wo Contrast  Result Date: 10/30/2017 CLINICAL DATA:  Unwitnessed fall backwards. No loss of consciousness. History of atrial fibrillation, dementia, hypertension. EXAM: CT HEAD WITHOUT CONTRAST CT CERVICAL SPINE WITHOUT CONTRAST TECHNIQUE: Multidetector CT imaging of the head and cervical spine was performed following the standard protocol without intravenous contrast. Multiplanar CT image reconstructions of the cervical spine were also generated. COMPARISON:  None. CT HEAD July 05, 2017 and CT angiogram of the neck July 04, 2017 FINDINGS: CT HEAD FINDINGS BRAIN: No intraparenchymal hemorrhage, mass effect nor midline shift. Severe ventriculomegaly with disproportionate sulcal effacement at the convexities associated with narrowed callosal angle and lobulated ventricular contour. Patchy  to confluent supratentorial white matter hypodensities. No acute large vascular territory infarcts. No abnormal extra-axial fluid collections. Basal cisterns are patent. VASCULAR: Moderate to severe calcific atherosclerosis of the carotid siphons. SKULL: No skull fracture. Severe LEFT temporomandibular osteoarthrosis. Old mildly depressed distal nasal bone fracture. No significant scalp soft tissue swelling. SINUSES/ORBITS: The mastoid air-cells and included paranasal sinuses are well-aerated.The included ocular globes and orbital contents are non-suspicious. OTHER: None. CT CERVICAL SPINE  FINDINGS ALIGNMENT: Straightened cervical lordosis. Stable grade 1 C3-4 anterolisthesis. SKULL BASE AND VERTEBRAE: Vertebral bodies intact. Severe RIGHT upper cervical facet arthropathy with chronically fragmented RIGHT C3 inferior articular facet. Severe site 5 6 and C6-7 disc height loss with endplate sclerosis and marginal spurring compatible with degenerative discs. C1-2 articulation maintained with calcified pannus about the odontoid process seen with reported rheumatoid arthritis and, CPPD without basilar invagination. Osteopenia. SOFT TISSUES AND SPINAL CANAL: Nonacute. Severe calcific atherosclerosis bilateral carotid siphons previously interrogated by CTA NECK July 04, 2017. DISC LEVELS: Mild to moderate canal stenosis C5-6 and mild canal stenosis C6-7. Severe RIGHT C3-4, LEFT C5-6 neural foraminal narrowing. Moderate to severe RIGHT C5-6 and C6-7 neural foraminal narrowing. UPPER CHEST: Lung apices are clear. OTHER: None. IMPRESSION: CT HEAD: 1. No acute intracranial process. 2. Moderate to severe parenchymal brain volume loss with superimposed suspected normal pressure hydrocephalus. 3. Moderate to severe chronic small vessel ischemic disease. CT CERVICAL SPINE: 1. No acute fracture. Stable grade 1 C3-4 anterolisthesis on degenerative basis. 2. Severe C3-4 and C5-6 neural foraminal narrowing. Electronically Signed    By: Awilda Metro M.D.   On: 10/30/2017 21:22    Procedures Procedures (including critical care time)  Medications Ordered in ED Medications  metoprolol tartrate (LOPRESSOR) tablet 75 mg (not administered)     Initial Impression / Assessment and Plan / ED Course  I have reviewed the triage vital signs and the nursing notes.  Pertinent labs & imaging results that were available during my care of the patient were reviewed by me and considered in my medical decision making (see chart for details).  84 year old female presents for unwitnessed fall at SNF. She has been mildly hypertensive. On initial vitals she was not tachycardic however when EKG was done she was in A. Fib with RVR. I think this is likely due to missing her Metoprolol tonight. Her exam was unremarkable and she has no complaints. CT head and C-spine were ordered since patient is an unreliable historian.  CT head is remarkable for severe parenchymal brain volume loss with superimposed suspected normal pressure hydrocephalus. This was not noted on prior CTs however when old CTs were reviewed, they appear similar. CT of C-spine is negative for fx. Since EKG shows A-fib with RVR, a dose of her metoprolol was ordered and CBC, BMP.   Labs are normal. At shift change, she still hasn't received metoprolol. After she receives this and EKG is recheck, she can be discharged back to SNF. Care was transferred to [facility] PA-C.  Final Clinical Impressions(s) / ED Diagnoses   Final diagnoses:  Fall, initial encounter  Atrial fibrillation with RVR [facility])    ED Discharge Orders    None       Koni, Kannan, PA-C 10/31/17 0017    Loren Racer, MD 11/01/17 [phone]

## 2017-10-30 NOTE — ED Triage Notes (Signed)
Per EMS-unwitnessed fall-states she stood up and fell backwards-no LOC, no blood thinners-patient remembers fall-no evidence of patient hitting head

## 2017-10-30 NOTE — Progress Notes (Signed)
[facility] ready to accept patient back today.  Osborne Casco Cyprian Gongaware LCSW [phone]

## 2017-10-31 DIAGNOSIS — I10 Essential (primary) hypertension: Secondary | ICD-10-CM | POA: Diagnosis not present

## 2017-10-31 DIAGNOSIS — R69 Illness, unspecified: Secondary | ICD-10-CM | POA: Diagnosis not present

## 2017-10-31 DIAGNOSIS — I499 Cardiac arrhythmia, unspecified: Secondary | ICD-10-CM | POA: Diagnosis not present

## 2017-10-31 DIAGNOSIS — I4891 Unspecified atrial fibrillation: Secondary | ICD-10-CM | POA: Diagnosis not present

## 2017-10-31 DIAGNOSIS — W19XXXA Unspecified fall, initial encounter: Secondary | ICD-10-CM | POA: Diagnosis not present

## 2017-10-31 DIAGNOSIS — I259 Chronic ischemic heart disease, unspecified: Secondary | ICD-10-CM | POA: Diagnosis not present

## 2017-10-31 DIAGNOSIS — Z79899 Other long term (current) drug therapy: Secondary | ICD-10-CM | POA: Diagnosis not present

## 2017-10-31 DIAGNOSIS — Z7982 Long term (current) use of aspirin: Secondary | ICD-10-CM | POA: Diagnosis not present

## 2017-10-31 DIAGNOSIS — R4182 Altered mental status, unspecified: Secondary | ICD-10-CM | POA: Diagnosis not present

## 2017-10-31 NOTE — ED Notes (Signed)
Pt's contact:   Misty Stanley (daughter)---- tel# [phone] NOTE:  Please call daughter on pt's admission or discharge.

## 2017-10-31 NOTE — ED Notes (Signed)
Staff at [facility] was called and report given on pt's condition and discharge.

## 2017-10-31 NOTE — ED Notes (Addendum)
Pt's daughter, Misty Stanley, was called and left message about pt's discharge.

## 2017-10-31 NOTE — ED Notes (Signed)
[facility] was called for pt's transportation back to [facility] at [facility] facility.

## 2017-10-31 NOTE — Discharge Instructions (Signed)
1. Medications: usual home medications 2. Treatment: rest, drink plenty of fluids,  3. Follow Up: Please followup with your primary doctor in 2-3 days for discussion of your diagnoses and further evaluation after today's visit; if you do not have a primary care doctor use the resource guide provided to find one; Please return to the ER for new or recurrent symptoms

## 2017-10-31 NOTE — ED Notes (Signed)
[facility] here to transport pt back to [facility] at [facility].

## 2017-10-31 NOTE — ED Provider Notes (Signed)
Care assumed from [facility] [place].  Please see her full H&P.  In short,  Connie Wagner is a 84 y.o. female Hx a-fib, dementia, presents for fall from nursing home. She was D/C 2 days ago after admission for PNA.  CT unchanged from previous.  ECG a-fib with RVR.  She has not had her home meds.  Pt is not anticoagulated as she has recurrent falls.    Pt was evaluated by Dr. Ranae Palms who agrees with the plan.    Physical Exam  BP 137/70 (BP Location: Right Arm)   Pulse 66   Temp 98.6 F (37 C) (Oral)   Resp 16   SpO2 98%   Physical Exam  Constitutional: She appears well-developed and well-nourished. No distress.  HENT:  Head: Normocephalic.  Eyes: Conjunctivae are normal. No scleral icterus.  Neck: Normal range of motion.  Cardiovascular: Intact distal pulses. An irregularly irregular rhythm present. Tachycardia present.  Pulses:      Radial pulses are 2+ on the right side, and 2+ on the left side.  Pulmonary/Chest: Effort normal.  Musculoskeletal: Normal range of motion.  Neurological: She is alert.  Skin: Skin is warm and dry.  Nursing note and vitals reviewed.   EKG Interpretation  Date/Time:  Tuesday October 31 2017 02:48:12 EST Ventricular Rate:  97 PR Interval:    QRS Duration: 101 QT Interval:  360 QTC Calculation: 458 R Axis:   30 Text Interpretation:  Atrial fibrillation Minimal ST depression, lateral leads Rate slower Confirmed by Glynn Octave [phone]) on 10/31/2017 2:50:08 AM        ED Course/Procedures   Clinical Course as of Nov 01 302  Tue Oct 31, 2017  0015 Plan: pt is to receive home Metoprolol and if rate controlled at that time may be d/c home.   [HM]  0017 HR 90-110 at this time  [HM]  0255 ECG remains with a-fib, but is now rate controlled < 100  [HM]    Clinical Course User Index [HM] Toryn Mcclinton, Dahlia Client, PA-C    Procedures  MDM   Patient given her home metoprolol.  On repeat exam she has normal rate with irregularly irregular  rhythm.  Repeat EKG shows rate controlled A. fib.  She is arousable and pleasant.  Will be discharged to her facility.  The patient was discussed with Dr. Manus Gunning who reviewed the repeat ECG agrees with the treatment plan.    Fall, initial encounter  Atrial fibrillation with RVR [facility])      Avaree Gilberti, Boyd Kerbs 10/31/17 7741    Glynn Octave, MD 10/31/17 [phone]

## 2017-11-07 ENCOUNTER — Other Ambulatory Visit: Payer: Self-pay | Admitting: Geriatric Medicine

## 2017-11-07 ENCOUNTER — Ambulatory Visit
Admission: RE | Admit: 2017-11-07 | Discharge: 2017-11-07 | Disposition: A | Discharge status: Home / Self Care | Payer: Medicare HMO | Source: Ambulatory Visit | Attending: Geriatric Medicine | Admitting: Geriatric Medicine

## 2017-11-07 DIAGNOSIS — R05 Cough: Secondary | ICD-10-CM | POA: Diagnosis not present

## 2017-11-07 DIAGNOSIS — J181 Lobar pneumonia, unspecified organism: Secondary | ICD-10-CM | POA: Diagnosis not present

## 2017-11-07 DIAGNOSIS — L989 Disorder of the skin and subcutaneous tissue, unspecified: Secondary | ICD-10-CM | POA: Diagnosis not present

## 2017-11-07 DIAGNOSIS — J189 Pneumonia, unspecified organism: Secondary | ICD-10-CM

## 2017-11-07 DIAGNOSIS — J90 Pleural effusion, not elsewhere classified: Secondary | ICD-10-CM | POA: Diagnosis not present

## 2017-11-07 DIAGNOSIS — G301 Alzheimer's disease with late onset: Secondary | ICD-10-CM | POA: Diagnosis not present

## 2017-11-07 DIAGNOSIS — I129 Hypertensive chronic kidney disease with stage 1 through stage 4 chronic kidney disease, or unspecified chronic kidney disease: Secondary | ICD-10-CM | POA: Diagnosis not present

## 2017-11-07 DIAGNOSIS — I481 Persistent atrial fibrillation: Secondary | ICD-10-CM | POA: Diagnosis not present

## 2017-11-07 DIAGNOSIS — H6123 Impacted cerumen, bilateral: Secondary | ICD-10-CM | POA: Diagnosis not present

## 2017-11-07 DIAGNOSIS — Z79899 Other long term (current) drug therapy: Secondary | ICD-10-CM | POA: Diagnosis not present

## 2017-11-07 DIAGNOSIS — N183 Chronic kidney disease, stage 3 (moderate): Secondary | ICD-10-CM | POA: Diagnosis not present

## 2017-11-07 DIAGNOSIS — I10 Essential (primary) hypertension: Secondary | ICD-10-CM | POA: Diagnosis not present

## 2017-11-14 DIAGNOSIS — H900 Conductive hearing loss, bilateral: Secondary | ICD-10-CM | POA: Diagnosis not present

## 2017-11-14 DIAGNOSIS — H6123 Impacted cerumen, bilateral: Secondary | ICD-10-CM | POA: Diagnosis not present

## 2017-11-24 ENCOUNTER — Encounter: Payer: Self-pay | Admitting: Podiatry

## 2017-11-24 ENCOUNTER — Ambulatory Visit: Payer: Medicare HMO | Admitting: Podiatry

## 2017-11-24 VITALS — BP 118/72 | HR 70

## 2017-11-24 DIAGNOSIS — M79675 Pain in left toe(s): Secondary | ICD-10-CM

## 2017-11-24 DIAGNOSIS — M79674 Pain in right toe(s): Secondary | ICD-10-CM

## 2017-11-24 DIAGNOSIS — B351 Tinea unguium: Secondary | ICD-10-CM | POA: Diagnosis not present

## 2017-11-24 NOTE — Progress Notes (Signed)
Complaint:  Visit Type: Patient presents  to my office for continued preventative foot care services. Complaint: Patient states" my nails have grown long and thick and become painful to walk and wear shoes" Patient has not been seen for over 4 years. The patient presents for preventative foot care services. No changes to ROS.  Her big toenail right foot is causing skin lesions due to its thickness.  Podiatric Exam: Vascular: dorsalis pedis  Are weakly.  palpable bilateral.  Posterior tibial pulses are absent  B/L. Capillary return is immediate. Temperature gradient is WNL. Skin turgor WNL  Sensorium: Normal Semmes Weinstein monofilament test. Normal tactile sensation bilaterally. Nail Exam: Pt has thick disfigured discolored nails with subungual debris noted bilateral entire nail hallux through fifth toenails Ulcer Exam: There is no evidence of ulcer or pre-ulcerative changes or infection. Orthopedic Exam: Muscle tone and strength are WNL. No limitations in general ROM. No crepitus or effusions noted. Foot type and digits show no abnormalities. Bony prominences are unremarkable. Skin: No Porokeratosis. No infection or ulcers  Diagnosis:  Onychomycosis, , Pain in right toe, pain in left toes  Treatment & Plan Procedures and Treatment: Consent by patient was obtained for treatment procedures.   Debridement of mycotic and hypertrophic toenails, 1 through 5 bilateral and clearing of subungual debris. No ulceration, no infection noted. Bandage applied right hallux due to skin lesion caused by overgrown great toenail right  Return Visit-Office Procedure: Patient instructed to return to the office for a follow up visit 3 months for continued evaluation and treatment.    Helane Gunther DPM

## 2017-12-13 ENCOUNTER — Encounter (HOSPITAL_COMMUNITY): Payer: Self-pay | Admitting: Emergency Medicine

## 2017-12-13 ENCOUNTER — Emergency Department (HOSPITAL_COMMUNITY): Payer: Medicare HMO

## 2017-12-13 ENCOUNTER — Emergency Department (HOSPITAL_COMMUNITY)
Admission: EM | Admit: 2017-12-13 | Discharge: 2017-12-14 | Disposition: A | Discharge status: Home / Self Care | Payer: Medicare HMO | Attending: Emergency Medicine | Admitting: Emergency Medicine

## 2017-12-13 DIAGNOSIS — F0390 Unspecified dementia without behavioral disturbance: Secondary | ICD-10-CM | POA: Insufficient documentation

## 2017-12-13 DIAGNOSIS — M542 Cervicalgia: Secondary | ICD-10-CM | POA: Diagnosis not present

## 2017-12-13 DIAGNOSIS — S0990XA Unspecified injury of head, initial encounter: Secondary | ICD-10-CM

## 2017-12-13 DIAGNOSIS — Y92129 Unspecified place in nursing home as the place of occurrence of the external cause: Secondary | ICD-10-CM | POA: Insufficient documentation

## 2017-12-13 DIAGNOSIS — W0110XA Fall on same level from slipping, tripping and stumbling with subsequent striking against unspecified object, initial encounter: Secondary | ICD-10-CM | POA: Diagnosis not present

## 2017-12-13 DIAGNOSIS — Z96651 Presence of right artificial knee joint: Secondary | ICD-10-CM | POA: Insufficient documentation

## 2017-12-13 DIAGNOSIS — Y999 Unspecified external cause status: Secondary | ICD-10-CM | POA: Insufficient documentation

## 2017-12-13 DIAGNOSIS — S0993XA Unspecified injury of face, initial encounter: Secondary | ICD-10-CM | POA: Diagnosis not present

## 2017-12-13 DIAGNOSIS — Z79899 Other long term (current) drug therapy: Secondary | ICD-10-CM | POA: Diagnosis not present

## 2017-12-13 DIAGNOSIS — I10 Essential (primary) hypertension: Secondary | ICD-10-CM | POA: Insufficient documentation

## 2017-12-13 DIAGNOSIS — S199XXA Unspecified injury of neck, initial encounter: Secondary | ICD-10-CM | POA: Diagnosis not present

## 2017-12-13 DIAGNOSIS — I5021 Acute systolic (congestive) heart failure: Secondary | ICD-10-CM | POA: Diagnosis not present

## 2017-12-13 DIAGNOSIS — S0101XA Laceration without foreign body of scalp, initial encounter: Secondary | ICD-10-CM | POA: Insufficient documentation

## 2017-12-13 DIAGNOSIS — Y939 Activity, unspecified: Secondary | ICD-10-CM | POA: Insufficient documentation

## 2017-12-13 DIAGNOSIS — Z7982 Long term (current) use of aspirin: Secondary | ICD-10-CM | POA: Diagnosis not present

## 2017-12-13 DIAGNOSIS — I2510 Atherosclerotic heart disease of native coronary artery without angina pectoris: Secondary | ICD-10-CM | POA: Insufficient documentation

## 2017-12-13 DIAGNOSIS — M25561 Pain in right knee: Secondary | ICD-10-CM | POA: Diagnosis not present

## 2017-12-13 DIAGNOSIS — M25562 Pain in left knee: Secondary | ICD-10-CM | POA: Diagnosis not present

## 2017-12-13 DIAGNOSIS — R69 Illness, unspecified: Secondary | ICD-10-CM | POA: Diagnosis not present

## 2017-12-13 DIAGNOSIS — S8992XA Unspecified injury of left lower leg, initial encounter: Secondary | ICD-10-CM | POA: Diagnosis not present

## 2017-12-13 LAB — CBC WITH DIFFERENTIAL/PLATELET
BASOS PCT: 0 %
Basophils Absolute: 0 10*3/uL (ref 0.0–0.1)
Eosinophils Absolute: 0.2 10*3/uL (ref 0.0–0.7)
Eosinophils Relative: 3 %
HEMATOCRIT: 35.9 % — AB (ref 36.0–46.0)
HEMOGLOBIN: 10.8 g/dL — AB (ref 12.0–15.0)
LYMPHS ABS: 1.1 10*3/uL (ref 0.7–4.0)
Lymphocytes Relative: 15 %
MCH: 26.6 pg (ref 26.0–34.0)
MCHC: 30.1 g/dL (ref 30.0–36.0)
MCV: 88.4 fL (ref 78.0–100.0)
MONOS PCT: 10 %
Monocytes Absolute: 0.7 10*3/uL (ref 0.1–1.0)
NEUTROS ABS: 5.2 10*3/uL (ref 1.7–7.7)
NEUTROS PCT: 72 %
Platelets: 162 10*3/uL (ref 150–400)
RBC: 4.06 MIL/uL (ref 3.87–5.11)
RDW: 17.1 % — ABNORMAL HIGH (ref 11.5–15.5)
WBC: 7.2 10*3/uL (ref 4.0–10.5)

## 2017-12-13 LAB — BASIC METABOLIC PANEL
ANION GAP: 11 (ref 5–15)
BUN: 10 mg/dL (ref 6–20)
CHLORIDE: 106 mmol/L (ref 101–111)
CO2: 22 mmol/L (ref 22–32)
Calcium: 8.8 mg/dL — ABNORMAL LOW (ref 8.9–10.3)
Creatinine, Ser: 0.99 mg/dL (ref 0.44–1.00)
GFR calc non Af Amer: 50 mL/min — ABNORMAL LOW (ref >60)
GFR, EST AFRICAN AMERICAN: 58 mL/min — AB (ref >60)
Glucose, Bld: 130 mg/dL — ABNORMAL HIGH (ref 65–99)
POTASSIUM: 4.3 mmol/L (ref 3.5–5.1)
Sodium: 139 mmol/L (ref 135–145)

## 2017-12-13 LAB — I-STAT TROPONIN, ED: Troponin i, poc: 0.03 ng/mL (ref 0.00–0.08)

## 2017-12-13 MED ORDER — LIDOCAINE-EPINEPHRINE 2 %-1:100000 IJ SOLN
20.0000 mL | Freq: Once | INTRAMUSCULAR | Status: AC
  Administered 2017-12-13: 20 mL
  Filled 2017-12-13: qty 20

## 2017-12-13 MED ORDER — LIDOCAINE-EPINEPHRINE (PF) 2 %-1:200000 IJ SOLN
5.0000 mL | Freq: Once | INTRAMUSCULAR | Status: AC
  Administered 2017-12-13: 5 mL via INTRADERMAL

## 2017-12-13 MED ORDER — LIDOCAINE HCL (PF) 1 % IJ SOLN
INTRAMUSCULAR | Status: AC
  Filled 2017-12-13: qty 5

## 2017-12-13 NOTE — ED Notes (Signed)
Transported to CT scan and X-ray.

## 2017-12-13 NOTE — Discharge Instructions (Addendum)
Please read and follow all provided instructions.  Your diagnoses today include:  1. Laceration of scalp, initial encounter   2. Injury of head, initial encounter     Tests performed today include:  CT scan of your head and neck that did not show any serious injury.  X-rays of your knees - no broken bones or problems with your knee replacement  Blood counts, electrolytes, and EKG  Vital signs. See below for your results today.   Medications prescribed:   None  Take any prescribed medications only as directed.  Home care instructions:  Follow any educational materials contained in this packet.  Follow-up instructions: Please follow-up with your primary care provider in the next 3 days for further evaluation of your symptoms.   Staples will need to be removed in a week.   Return instructions:  SEEK IMMEDIATE MEDICAL ATTENTION IF:  There is confusion or drowsiness (although children frequently become drowsy after injury).   You cannot awaken the injured person.   You have more than one episode of vomiting.   You notice dizziness or unsteadiness which is getting worse, or inability to walk.   You have convulsions or unconsciousness.   You experience severe, persistent headaches not relieved by Tylenol.  You cannot use arms or legs normally.   There are changes in pupil sizes. (This is the black center in the colored part of the eye)   There is clear or bloody discharge from the nose or ears.   You have change in speech, vision, swallowing, or understanding.   Localized weakness, numbness, tingling, or change in bowel or bladder control.  You have any other emergent concerns.  Additional Information: You have had a head injury which does not appear to require admission at this time.  Your vital signs today were: BP (!) 144/65 (BP Location: Right Arm)    Pulse 99    Resp 14    Ht 5\' 6"  (1.676 m)    Wt 72.6 kg (160 lb)    SpO2 95%    BMI 25.82 kg/m  If your  blood pressure (BP) was elevated above 135/85 this visit, please have this repeated by your doctor within one month. --------------

## 2017-12-13 NOTE — ED Notes (Signed)
[facility] paged by secretary for transport back to nursing home.

## 2017-12-13 NOTE — ED Notes (Signed)
Patient currently at radiology.

## 2017-12-13 NOTE — ED Triage Notes (Addendum)
Patient tripped and fell this evening at  nursing home , denies LOC , presents with scalp laceration at right temple area with mild bleeding , poor historian due to dementia / can not recall incident , bilateral knee pain and mild diffused posterior neck pain on palpation  . Disoriented to place and time . She is not taking anticoagulant .

## 2017-12-13 NOTE — ED Notes (Signed)
[facility] called for transport.  

## 2017-12-13 NOTE — ED Provider Notes (Signed)
[facility] EMERGENCY DEPARTMENT Provider Note   CSN: [id number] Arrival date & time: 12/13/17  1936     History   Chief Complaint Chief Complaint  Patient presents with  . Fall    Scalp Laceration     HPI Connie Wagner is a 84 y.o. female.  Patient with history of dementia presents from nursing facility with reported trip and fall.  Patient struck her head and sustained a laceration to her scalp.  Patient currently complaining of minor neck pain and pain in her bilateral knees, left greater than right.  Level 5 caveat due to dementia.  Patient with DO NOT RESUSCITATE order at bedside.     Past Medical History:  Diagnosis Date  . Anemia    iron deficient  . Anxiety   . Atrial fibrillation (HCC)    per daughter  . CAD (coronary artery disease)    a. s/p Inf STEMI 2/14 => LHC 10/18/12: Diffuse distal LM 30%, LAD 50% calcified stenosis just after first septal perforator, diagonal branches with mild 20-30% stenosis, mid RCA 99%. PCI: Promus Premier DES to the mid RCA  . Carotid stenosis    a. [employer] screening done in 2009 with carotid stenosis noted;  b.  Carotid dopplers 3/14: R 0-39%, L 40-59%, repeat 1 year  . Chronic lower back pain   . 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 Dementia    needs redirection freq.  . Depression   . Hypertension   . Ischemic cardiomyopathy    a. Echocardiogram 10/18/12: Mild LVH, inferior and inferoseptal AK, posterior HK, EF 45%, grade 1 diastolic dysfunction, aortic sclerosis without stenosis, mild MR, mild LAE, RV systolic function moderately reduced (pacer wire in right ventricle), PASP 32  . Migraines    "haven't had one since going thru menopause"  . Rheumatoid arthritis(714.0)   . Upper GI bleeding 09/14/11   "have had it once before today"    Patient Active Problem List   Diagnosis Date Noted  . Atrial fibrillation with RVR  (HCC) 10/26/2017  . Wheezing   . Weakness   . Palliative care by specialist   . Goals of care, counseling/discussion   . Acute pneumonia 10/22/2017  . Shortness of breath 10/22/2017  . Carotid stenosis, bilateral 07/05/2017  . Cerebral embolism with cerebral infarction 07/04/2017  . Stroke (cerebrum) (HCC) 07/04/2017  . Physical deconditioning 10/23/2012  . Cardiogenic shock (HCC) 10/23/2012  . Wenckebach second degree AV block 10/20/2012  . Complete heart block (HCC) 10/19/2012  . Acute renal failure (HCC) 10/19/2012  . Acute lower UTI 10/19/2012  . Acute systolic CHF (congestive heart failure) (HCC) 10/19/2012  . Normocytic anemia 10/19/2012  . S/P drug eluting coronary stent placement 10/19/2012  . Coronary artery disease 10/19/2012  . Acute MI, inferior wall, initial episode of care (HCC) 10/18/2012  . Urinary tract infection, E. coli 08/21/2012  . Dementia   . Depression   . Prepyloric ulcer 09/15/2011  . Hypotension due to blood loss 09/15/2011  . Leukocytosis 09/15/2011  . Extrarenal azotemia 09/15/2011  . GI bleeding 09/14/2011  . Anemia associated with acute blood loss 09/14/2011  . Tachycardia 09/14/2011  . HTN (hypertension) 09/14/2011  . Anxiety 09/14/2011  . ETOH abuse 09/14/2011  . 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 09/14/2011    Past Surgical History:  Procedure Laterality Date  . BACK SURGERY    .  BREAST LUMPECTOMY     "benign tumor removed"  . ESOPHAGOGASTRODUODENOSCOPY  09/15/2011   Procedure: ESOPHAGOGASTRODUODENOSCOPY (EGD);  Surgeon: Barrie Folk, MD;  Location: [facility] ENDOSCOPY;  Service: Endoscopy;  Laterality: N/A;  . HARVEST BONE GRAFT  08/17/2012   Procedure: HARVEST ILIAC BONE GRAFT;  Surgeon: Eulas Post, MD;  Location: [facility] OR;  Service: Orthopedics;  Laterality: Right;  . HEMATOMA EVACUATION  10/2002   lumbar  . INCISION AND DRAINAGE OF WOUND  10/2002   lumbar wound  infection  . JOINT REPLACEMENT    . KNEE ARTHROSCOPY  01/2001   right  . LEFT HEART CATHETERIZATION WITH CORONARY ANGIOGRAM N/A 10/18/2012   Procedure: LEFT HEART CATHETERIZATION WITH CORONARY ANGIOGRAM;  Surgeon: Tonny Bollman, MD;  Location: [facility] CATH LAB;  Service: Cardiovascular;  Laterality: N/A;  . LUMBAR DISC SURGERY  09/2002   Hemilaminectomy, L3, complete laminectomy, L4, hemi- semilaminectomy, L5 and right S1, and diskectomy, L5-S1.  Marland Kitchen LUMBAR LAMINECTOMY  10/2003   for epidural abscess evacuation; L4-5  . ORIF HUMERUS FRACTURE  08/17/2012   Procedure: OPEN REDUCTION INTERNAL FIXATION (ORIF) DISTAL HUMERUS FRACTURE;  Surgeon: Eulas Post, MD;  Location: [facility] OR;  Service: Orthopedics;  Laterality: Right;  ORIF RIGHT SUPRACHONDULAR HUMERUS FRACTURE  . TOTAL KNEE ARTHROPLASTY  01/2002   right     OB History    Gravida  0   Para  0   Term  0   Preterm  0   AB  0   Living  0     SAB  0   TAB  0   Ectopic  0   Multiple  0   Live Births               Home Medications    Prior to Admission medications   Medication Sig Start Date End Date Taking? Authorizing Provider  acetaminophen (TYLENOL) 325 MG tablet Take 2 tablets (650 mg total) every 6 (six) hours as needed by mouth for mild pain or fever. 07/06/17   Beryl Meager, NP  aspirin EC 81 MG EC tablet Take 1 tablet (81 mg total) daily by mouth. 07/06/17   Beryl Meager, NP  benzonatate (TESSALON) 200 MG capsule Take 1 capsule (200 mg total) by mouth 3 (three) times daily as needed for cough. 10/28/17   Ghimire, Werner Lean, MD  cefUROXime (CEFTIN) 500 MG tablet Take 1 tablet (500 mg total) by mouth 2 (two) times daily with a meal. Foe 2 more days from 3/2 and then stop 10/28/17   Ghimire, Werner Lean, MD  diclofenac sodium (VOLTAREN) 1 % GEL Apply 2 g topically 4 (four) times daily. 10/28/17   Ghimire, Werner Lean, MD  digoxin (LANOXIN) 0.125 MG tablet Take 1 tablet (125 mcg total) by mouth daily. 10/30/17 10/30/18  Leroy Sea, MD  guaiFENesin (MUCINEX) 600 MG 12 hr tablet Take 1 tablet (600 mg total) by mouth 2 (two) times daily. 10/28/17   Ghimire, Werner Lean, MD  metoprolol tartrate (LOPRESSOR) 50 MG tablet Take 1.5 tablets (75 mg total) by mouth 2 (two) times daily. 10/30/17 10/30/18  Leroy Sea, MD  pantoprazole (PROTONIX) 40 MG tablet Take 1 tablet (40 mg total) daily by mouth. 07/06/17   Beryl Meager, NP  polyethylene glycol (MIRALAX / GLYCOLAX) packet Take 17 g by mouth daily. 10/28/17   Ghimire, Werner Lean, MD  pravastatin (PRAVACHOL) 40 MG tablet Take 1 tablet (40 mg total) daily at 6 PM by  mouth. 07/06/17   Beryl Meager, NP  QUEtiapine (SEROQUEL) 25 MG tablet Take 1 tablet (25 mg total) 2 (two) times daily by mouth. 07/06/17   Beryl Meager, NP    Family History Family History  Problem Relation Age of Onset  . Heart attack Mother   . Stroke Mother   . Hypertension Sister   . Anesthesia problems Neg Hx   . Hypotension Neg Hx   . Malignant hyperthermia Neg Hx   . Pseudochol deficiency Neg Hx     Social History Social History   Tobacco Use  . Smoking status: Never Smoker  . Smokeless tobacco: Never Used  Substance Use Topics  . Alcohol use: Yes    Alcohol/week: 3.0 oz    Types: 5 Glasses of wine per week  . Drug use: No     Allergies   Amoxicillin-pot clavulanate; Codeine; Escitalopram oxalate; Iron; Losartan potassium; Metronidazole; Raloxifene hcl; Tetracycline hcl; Other; Penicillins; Raloxifene; and Sulfa drugs cross reactors   Review of Systems Review of Systems  Unable to perform ROS: Dementia     Physical Exam Updated Vital Signs BP (!) 144/65 (BP Location: Right Arm)   Pulse 99   Resp 14   Ht 5\' 6"  (1.676 m)   Wt 72.6 kg (160 lb)   SpO2 95%   BMI 25.82 kg/m   Physical Exam  Constitutional: She is oriented to person, place, and time. She appears well-developed and well-nourished.  HENT:  Head: Normocephalic and atraumatic. Head is without raccoon's  eyes and without Battle's sign.  Right Ear: Tympanic membrane, external ear and ear canal normal. No hemotympanum.  Left Ear: Tympanic membrane, external ear and ear canal normal. No hemotympanum.  Nose: Nose normal. No nasal septal hematoma.  Mouth/Throat: Uvula is midline, oropharynx is clear and moist and mucous membranes are normal.  3 cm, clean, hemostatic laceration to the right parietal scalp.  Eyes: Pupils are equal, round, and reactive to light. Conjunctivae, EOM and lids are normal. Right eye exhibits no discharge. Left eye exhibits no discharge. Right eye exhibits no nystagmus. Left eye exhibits no nystagmus.  No visible hyphema noted  Neck: Normal range of motion. Neck supple.  Cardiovascular: Normal rate, regular rhythm and normal heart sounds.  Pulmonary/Chest: Effort normal and breath sounds normal.  Abdominal: Soft. There is no tenderness.  Musculoskeletal:       Right hip: She exhibits normal range of motion, normal strength and no tenderness.       Left hip: She exhibits normal range of motion, normal strength and no tenderness.       Right knee: She exhibits normal range of motion and no swelling. Tenderness found.       Left knee: She exhibits normal range of motion and no swelling. Tenderness found.       Right ankle: Normal.       Left ankle: Normal.       Cervical back: She exhibits normal range of motion, no tenderness and no bony tenderness.       Thoracic back: She exhibits no tenderness and no bony tenderness.       Lumbar back: She exhibits no tenderness and no bony tenderness.  Neurological: She is alert and oriented to person, place, and time. She has normal strength and normal reflexes. No cranial nerve deficit or sensory deficit. Coordination normal. GCS eye subscore is 4. GCS verbal subscore is 5. GCS motor subscore is 6.  Skin: Skin is warm and dry.  Psychiatric: She has a normal mood and affect.  Nursing note and vitals reviewed.    ED Treatments /  Results  Labs (all labs ordered are listed, but only abnormal results are displayed) Labs Reviewed  CBC WITH DIFFERENTIAL/PLATELET - Abnormal; Notable for the following components:      Result Value   Hemoglobin 10.8 (*)    HCT 35.9 (*)    RDW 17.1 (*)    All other components within normal limits  BASIC METABOLIC PANEL - Abnormal; Notable for the following components:   Glucose, Bld 130 (*)    Calcium 8.8 (*)    GFR calc non Af Amer 50 (*)    GFR calc Af Amer 58 (*)    All other components within normal limits  I-STAT TROPONIN, ED    EKG EKG Interpretation  Date/Time:  Wednesday December 13 2017 19:47:29 EDT Ventricular Rate:  71 PR Interval:    QRS Duration: 92 QT Interval:  388 QTC Calculation: 421 R Axis:   57 Text Interpretation:  Atrial fibrillation Anterior infarct , age undetermined Marked ST abnormality, possible inferior subendocardial injury Abnormal ECG ST changes in similar distribution to Mar 2019 Confirmed by Pricilla Loveless [phone]) on 12/13/2017 7:50:54 PM   Radiology Ct Head Wo Contrast  Result Date: 12/13/2017 CLINICAL DATA:  Fall neck pain EXAM: CT HEAD WITHOUT CONTRAST CT CERVICAL SPINE WITHOUT CONTRAST TECHNIQUE: Multidetector CT imaging of the head and cervical spine was performed following the standard protocol without intravenous contrast. Multiplanar CT image reconstructions of the cervical spine were also generated. COMPARISON:  10/30/2017 FINDINGS: CT HEAD FINDINGS Brain: Atrophy and ventricular enlargement stable. Hypodensity throughout the cerebral white matter stable. Negative for acute infarct, hemorrhage, or mass. Vascular: Negative for hyperdense vessel Skull: Negative Sinuses/Orbits: Paranasal sinuses clear. Bilateral cataract surgery. Other: None CT CERVICAL SPINE FINDINGS Alignment: Mild anterolisthesis C3-4, C4-5. Mild retrolisthesis C5-6 and C6-7 Skull base and vertebrae: Negative for fracture Soft tissues and spinal canal: No soft tissue mass or  swelling. Atherosclerotic calcification Disc levels: Multilevel disc and facet degeneration throughout the cervical spine. Disc degeneration most severe at C5-6 and C6-7. Prominent pannus formation behind the dens. Upper chest: Negative Other: None IMPRESSION: 1. No acute intracranial abnormality 2. Negative for cervical spine fracture.  Cervical spondylosis Electronically Signed   By: Marlan Palau M.D.   On: 12/13/2017 20:37   Ct Cervical Spine Wo Contrast  Result Date: 12/13/2017 CLINICAL DATA:  Fall neck pain EXAM: CT HEAD WITHOUT CONTRAST CT CERVICAL SPINE WITHOUT CONTRAST TECHNIQUE: Multidetector CT imaging of the head and cervical spine was performed following the standard protocol without intravenous contrast. Multiplanar CT image reconstructions of the cervical spine were also generated. COMPARISON:  10/30/2017 FINDINGS: CT HEAD FINDINGS Brain: Atrophy and ventricular enlargement stable. Hypodensity throughout the cerebral white matter stable. Negative for acute infarct, hemorrhage, or mass. Vascular: Negative for hyperdense vessel Skull: Negative Sinuses/Orbits: Paranasal sinuses clear. Bilateral cataract surgery. Other: None CT CERVICAL SPINE FINDINGS Alignment: Mild anterolisthesis C3-4, C4-5. Mild retrolisthesis C5-6 and C6-7 Skull base and vertebrae: Negative for fracture Soft tissues and spinal canal: No soft tissue mass or swelling. Atherosclerotic calcification Disc levels: Multilevel disc and facet degeneration throughout the cervical spine. Disc degeneration most severe at C5-6 and C6-7. Prominent pannus formation behind the dens. Upper chest: Negative Other: None IMPRESSION: 1. No acute intracranial abnormality 2. Negative for cervical spine fracture.  Cervical spondylosis Electronically Signed   By: Marlan Palau M.D.   On: 12/13/2017 20:37  Dg Knee Complete 4 Views Left  Result Date: 12/13/2017 CLINICAL DATA:  Post fall, now with left knee pain. EXAM: LEFT KNEE - COMPLETE 4+ VIEW  COMPARISON:  None. FINDINGS: No fracture or dislocation. Moderate tricompartmental degenerative change of the knee, worse with the medial compartment with joint space loss, subchondral sclerosis osteophytosis. There is minimal spurring the tibial spines. No evidence of chondrocalcinosis. No joint effusion. Scattered vascular calcifications. Regional soft tissues appear otherwise normal. No radiopaque foreign body. IMPRESSION: 1. No fracture or dislocation. 2. Moderate tricompartmental degenerative change of the knee, worse within the medial compartment. Electronically Signed   By: Simonne Come M.D.   On: 12/13/2017 20:52   Dg Knee Complete 4 Views Right  Result Date: 12/13/2017 CLINICAL DATA:  Post fall, now with right knee pain. EXAM: RIGHT KNEE - COMPLETE 4+ VIEW COMPARISON:  None. FINDINGS: Post right total knee replacement. No evidence of hardware failure or loosening. No joint effusion. Scattered vascular calcifications. Regional soft tissues appear otherwise normal. No radiopaque foreign body. IMPRESSION: 1. No acute findings. 2. Post right total knee replacement without evidence of hardware failure or loosening. Electronically Signed   By: Simonne Come M.D.   On: 12/13/2017 20:51    Procedures .Marland KitchenLaceration Repair Date/Time: 12/13/2017 10:31 PM Performed by: Renne Crigler, PA-C Authorized by: Renne Crigler, PA-C   Consent:    Consent obtained:  Emergent situation Anesthesia (see MAR for exact dosages):    Anesthesia method:  Local infiltration   Local anesthetic:  Lidocaine 2% WITH epi Laceration details:    Location:  Scalp   Scalp location:  R parietal   Length (cm):  3 Repair type:    Repair type:  Simple Pre-procedure details:    Preparation:  Imaging obtained to evaluate for foreign bodies Exploration:    Hemostasis achieved with:  Epinephrine and direct pressure   Wound exploration: entire depth of wound probed and visualized     Contaminated: no   Treatment:    Area  cleansed with:  Shur-Clens   Amount of cleaning:  Standard Skin repair:    Repair method:  Staples   Number of staples:  2 Approximation:    Approximation:  Close Post-procedure details:    Dressing:  Open (no dressing)   Patient tolerance of procedure:  Tolerated well, no immediate complications   (including critical care time)  Medications Ordered in ED Medications  lidocaine-EPINEPHrine (XYLOCAINE W/EPI) 2 %-1:100000 (with pres) injection 20 mL (20 mLs Infiltration Given 12/13/17 2028)  lidocaine-EPINEPHrine (XYLOCAINE W/EPI) 2 %-1:200000 (PF) injection 5 mL (5 mLs Intradermal Given by Other 12/13/17 2225)     Initial Impression / Assessment and Plan / ED Course  I have reviewed the triage vital signs and the nursing notes.  Pertinent labs & imaging results that were available during my care of the patient were reviewed by me and considered in my medical decision making (see chart for details).     Patient seen and examined. Work-up initiated. Medications ordered.   Vital signs reviewed and are as follows: BP (!) 144/65 (BP Location: Right Arm)   Pulse 99   Resp 14   Ht 5\' 6"  (1.676 m)   Wt 72.6 kg (160 lb)   SpO2 95%   BMI 25.82 kg/m   Patient moved to room to complete exam.  Seen at bedside with Dr. Criss Alvine.  Wound repaired as above.  Patient is exhibiting some sundowning and confusion while in the ED.  Will try to get her home  as soon as possible now that workup completed.  Final Clinical Impressions(s) / ED Diagnoses   Final diagnoses:  Laceration of scalp, initial encounter  Injury of head, initial encounter   Patient with history of dementia, fall and head injury.  She has laceration is repaired.  Basic labs reassuring.  Patient denies any chest pains or shortness of breath.  CT head and neck are negative.  Imaging of the knees are negative.  ED Discharge Orders    None       Renne Crigler, Cordelia Poche 12/13/17 2233    Pricilla Loveless, MD 12/13/17 [phone]

## 2017-12-13 NOTE — ED Notes (Signed)
[facility] for transport.  

## 2017-12-14 DIAGNOSIS — R4182 Altered mental status, unspecified: Secondary | ICD-10-CM | POA: Diagnosis not present

## 2017-12-14 DIAGNOSIS — S0191XA Laceration without foreign body of unspecified part of head, initial encounter: Secondary | ICD-10-CM | POA: Diagnosis not present

## 2017-12-14 NOTE — ED Notes (Signed)
Pt unable to sign at this time. PT[facility] given report and instructions.

## 2017-12-14 NOTE — ED Notes (Signed)
[facility] took possession of belongings, discharge instructions, and paperwork.

## 2017-12-28 ENCOUNTER — Emergency Department (HOSPITAL_COMMUNITY): Payer: Medicare HMO

## 2017-12-28 ENCOUNTER — Emergency Department (HOSPITAL_COMMUNITY)
Admission: EM | Admit: 2017-12-28 | Discharge: 2017-12-28 | Disposition: A | Discharge status: Home / Self Care | Payer: Medicare HMO | Attending: Emergency Medicine | Admitting: Emergency Medicine

## 2017-12-28 ENCOUNTER — Other Ambulatory Visit: Payer: Self-pay

## 2017-12-28 ENCOUNTER — Encounter (HOSPITAL_COMMUNITY): Payer: Self-pay | Admitting: *Deleted

## 2017-12-28 DIAGNOSIS — I5021 Acute systolic (congestive) heart failure: Secondary | ICD-10-CM | POA: Diagnosis not present

## 2017-12-28 DIAGNOSIS — M25552 Pain in left hip: Secondary | ICD-10-CM | POA: Insufficient documentation

## 2017-12-28 DIAGNOSIS — Z7982 Long term (current) use of aspirin: Secondary | ICD-10-CM | POA: Insufficient documentation

## 2017-12-28 DIAGNOSIS — R69 Illness, unspecified: Secondary | ICD-10-CM | POA: Diagnosis not present

## 2017-12-28 DIAGNOSIS — R001 Bradycardia, unspecified: Secondary | ICD-10-CM

## 2017-12-28 DIAGNOSIS — Z79899 Other long term (current) drug therapy: Secondary | ICD-10-CM | POA: Insufficient documentation

## 2017-12-28 DIAGNOSIS — I110 Hypertensive heart disease with heart failure: Secondary | ICD-10-CM | POA: Diagnosis not present

## 2017-12-28 DIAGNOSIS — W19XXXA Unspecified fall, initial encounter: Secondary | ICD-10-CM | POA: Insufficient documentation

## 2017-12-28 DIAGNOSIS — S199XXA Unspecified injury of neck, initial encounter: Secondary | ICD-10-CM | POA: Diagnosis not present

## 2017-12-28 DIAGNOSIS — S79922A Unspecified injury of left thigh, initial encounter: Secondary | ICD-10-CM | POA: Diagnosis not present

## 2017-12-28 DIAGNOSIS — I2510 Atherosclerotic heart disease of native coronary artery without angina pectoris: Secondary | ICD-10-CM | POA: Diagnosis not present

## 2017-12-28 DIAGNOSIS — S0990XA Unspecified injury of head, initial encounter: Secondary | ICD-10-CM | POA: Diagnosis not present

## 2017-12-28 DIAGNOSIS — Z743 Need for continuous supervision: Secondary | ICD-10-CM | POA: Diagnosis not present

## 2017-12-28 DIAGNOSIS — S299XXA Unspecified injury of thorax, initial encounter: Secondary | ICD-10-CM | POA: Diagnosis not present

## 2017-12-28 DIAGNOSIS — F0390 Unspecified dementia without behavioral disturbance: Secondary | ICD-10-CM | POA: Diagnosis not present

## 2017-12-28 DIAGNOSIS — R279 Unspecified lack of coordination: Secondary | ICD-10-CM | POA: Diagnosis not present

## 2017-12-28 DIAGNOSIS — R102 Pelvic and perineal pain: Secondary | ICD-10-CM | POA: Diagnosis not present

## 2017-12-28 DIAGNOSIS — S3993XA Unspecified injury of pelvis, initial encounter: Secondary | ICD-10-CM | POA: Diagnosis not present

## 2017-12-28 DIAGNOSIS — M542 Cervicalgia: Secondary | ICD-10-CM | POA: Diagnosis present

## 2017-12-28 DIAGNOSIS — M79605 Pain in left leg: Secondary | ICD-10-CM | POA: Diagnosis not present

## 2017-12-28 LAB — CBC WITH DIFFERENTIAL/PLATELET
BASOS ABS: 0 10*3/uL (ref 0.0–0.1)
Basophils Relative: 0 %
EOS ABS: 0.2 10*3/uL (ref 0.0–0.7)
EOS PCT: 3 %
HCT: 31.8 % — ABNORMAL LOW (ref 36.0–46.0)
Hemoglobin: 9.6 g/dL — ABNORMAL LOW (ref 12.0–15.0)
LYMPHS ABS: 0.7 10*3/uL (ref 0.7–4.0)
LYMPHS PCT: 12 %
MCH: 27 pg (ref 26.0–34.0)
MCHC: 30.2 g/dL (ref 30.0–36.0)
MCV: 89.3 fL (ref 78.0–100.0)
MONO ABS: 0.6 10*3/uL (ref 0.1–1.0)
Monocytes Relative: 9 %
Neutro Abs: 4.5 10*3/uL (ref 1.7–7.7)
Neutrophils Relative %: 76 %
PLATELETS: 167 10*3/uL (ref 150–400)
RBC: 3.56 MIL/uL — ABNORMAL LOW (ref 3.87–5.11)
RDW: 17.5 % — AB (ref 11.5–15.5)
WBC: 6 10*3/uL (ref 4.0–10.5)

## 2017-12-28 LAB — BASIC METABOLIC PANEL
Anion gap: 7 (ref 5–15)
BUN: 11 mg/dL (ref 6–20)
CALCIUM: 7.6 mg/dL — AB (ref 8.9–10.3)
CO2: 24 mmol/L (ref 22–32)
Chloride: 110 mmol/L (ref 101–111)
Creatinine, Ser: 1.05 mg/dL — ABNORMAL HIGH (ref 0.44–1.00)
GFR calc Af Amer: 54 mL/min — ABNORMAL LOW (ref >60)
GFR, EST NON AFRICAN AMERICAN: 46 mL/min — AB (ref >60)
GLUCOSE: 101 mg/dL — AB (ref 65–99)
Potassium: 4.1 mmol/L (ref 3.5–5.1)
SODIUM: 141 mmol/L (ref 135–145)

## 2017-12-28 LAB — MAGNESIUM: Magnesium: 1.9 mg/dL (ref 1.7–2.4)

## 2017-12-28 LAB — DIGOXIN LEVEL: Digoxin Level: 1.8 ng/mL (ref 0.8–2.0)

## 2017-12-28 LAB — TROPONIN I: Troponin I: <0.03 ng/mL (ref <0.03)

## 2017-12-28 MED ORDER — SODIUM CHLORIDE 0.9 % IV SOLN
1.0000 g | Freq: Once | INTRAVENOUS | Status: DC
  Filled 2017-12-28: qty 10

## 2017-12-28 NOTE — ED Notes (Signed)
Called ptar for pt transport to [facility]

## 2017-12-28 NOTE — Discharge Instructions (Addendum)
Connie Wagner's medical evaluation today was very reassuring.  It was noted that her HR was low, in the 40-50s. Her blood pressure was normal. I suspect this is from her metoprolol.  INSTEAD OF taking 1.5 tablets (75 mg) twice a day, I would temporarily decrease this to 1 tablet (50 mg) twice a day, then see how she does. Have a doctor check on her in 2-3 days. If her HR is <60, do NOT give the metoprolol and notify the on-call physician.

## 2017-12-28 NOTE — ED Triage Notes (Addendum)
EMS called to [facility]  Because of PT fall. Pt fell in front lobby today and has a small abrasion to bridge of nose. EMS reported Pt uses a walker . Pt base line with dementia is A/O x2. EMS reported possible A-FIB with rate of 40 during trans port . HR on arrival to room 38 per monitor.

## 2017-12-28 NOTE — ED Provider Notes (Signed)
[facility] EMERGENCY DEPARTMENT Provider Note   CSN: [id number] Arrival date & time: 12/28/17  1045     History   Chief Complaint Chief Complaint  Patient presents with  . Fall    HPI Connie Wagner is a 84 y.o. female.  HPI   84 year old female with extensive past medical history as below here with fall.  The patient reportedly fell in the front entrance way of her nursing facility.  She has a history of recurrent falls and was just evaluated on 4/17 for the same.  Patient has history of A. fib but is not on blood thinners.  On my assessment, she is without complaints.  She does, however, once when her neck is palpated as well as her left hip and leg.  She does not know why she is here.  Denies any complaints.  Level 5 caveat invoked as remainder of history, ROS, and physical exam limited due to patient's dementia.   Past Medical History:  Diagnosis Date  . Anemia    iron deficient  . Anxiety   . Atrial fibrillation (HCC)    per daughter  . CAD (coronary artery disease)    a. s/p Inf STEMI 2/14 => LHC 10/18/12: Diffuse distal LM 30%, LAD 50% calcified stenosis just after first septal perforator, diagonal branches with mild 20-30% stenosis, mid RCA 99%. PCI: Promus Premier DES to the mid RCA  . Carotid stenosis    a. [employer] screening done in 2009 with carotid stenosis noted;  b.  Carotid dopplers 3/14: R 0-39%, L 40-59%, repeat 1 year  . Chronic lower back pain   . 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 Dementia    needs redirection freq.  . Depression   . Hypertension   . Ischemic cardiomyopathy    a. Echocardiogram 10/18/12: Mild LVH, inferior and inferoseptal AK, posterior HK, EF 45%, grade 1 diastolic dysfunction, aortic sclerosis without stenosis, mild MR, mild LAE, RV systolic function moderately reduced (pacer wire in right ventricle), PASP 32  . Migraines    "haven't had one since going thru menopause"  . Rheumatoid arthritis(714.0)   . Upper GI bleeding 09/14/11   "have had it once before today"    Patient Active Problem List   Diagnosis Date Noted  . Atrial fibrillation with RVR (HCC) 10/26/2017  . Wheezing   . Weakness   . Palliative care by specialist   . Goals of care, counseling/discussion   . Acute pneumonia 10/22/2017  . Shortness of breath 10/22/2017  . Carotid stenosis, bilateral 07/05/2017  . Cerebral embolism with cerebral infarction 07/04/2017  . Stroke (cerebrum) (HCC) 07/04/2017  . Physical deconditioning 10/23/2012  . Cardiogenic shock (HCC) 10/23/2012  . Wenckebach second degree AV block 10/20/2012  . Complete heart block (HCC) 10/19/2012  . Acute renal failure (HCC) 10/19/2012  . Acute lower UTI 10/19/2012  . Acute systolic CHF (congestive heart failure) (HCC) 10/19/2012  . Normocytic anemia 10/19/2012  . S/P drug eluting coronary stent placement 10/19/2012  . Coronary artery disease 10/19/2012  . Acute MI, inferior wall, initial episode of care (HCC) 10/18/2012  . Urinary tract infection, E. coli 08/21/2012  . Dementia   . Depression   . Prepyloric ulcer 09/15/2011  . Hypotension due to blood loss 09/15/2011  . Leukocytosis 09/15/2011  . Extrarenal azotemia 09/15/2011  . GI bleeding 09/14/2011  . Anemia associated with acute blood loss 09/14/2011  . Tachycardia 09/14/2011  .  HTN (hypertension) 09/14/2011  . Anxiety 09/14/2011  . ETOH abuse 09/14/2011  . 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 09/14/2011    Past Surgical History:  Procedure Laterality Date  . BACK SURGERY    . BREAST LUMPECTOMY     "benign tumor removed"  . ESOPHAGOGASTRODUODENOSCOPY  09/15/2011   Procedure: ESOPHAGOGASTRODUODENOSCOPY (EGD);  Surgeon: Barrie Folk, MD;  Location: [facility] ENDOSCOPY;  Service: Endoscopy;  Laterality: N/A;  . HARVEST BONE GRAFT  08/17/2012    Procedure: HARVEST ILIAC BONE GRAFT;  Surgeon: Eulas Post, MD;  Location: [facility] OR;  Service: Orthopedics;  Laterality: Right;  . HEMATOMA EVACUATION  10/2002   lumbar  . INCISION AND DRAINAGE OF WOUND  10/2002   lumbar wound infection  . JOINT REPLACEMENT    . KNEE ARTHROSCOPY  01/2001   right  . LEFT HEART CATHETERIZATION WITH CORONARY ANGIOGRAM N/A 10/18/2012   Procedure: LEFT HEART CATHETERIZATION WITH CORONARY ANGIOGRAM;  Surgeon: Tonny Bollman, MD;  Location: [facility] CATH LAB;  Service: Cardiovascular;  Laterality: N/A;  . LUMBAR DISC SURGERY  09/2002   Hemilaminectomy, L3, complete laminectomy, L4, hemi- semilaminectomy, L5 and right S1, and diskectomy, L5-S1.  Marland Kitchen LUMBAR LAMINECTOMY  10/2003   for epidural abscess evacuation; L4-5  . ORIF HUMERUS FRACTURE  08/17/2012   Procedure: OPEN REDUCTION INTERNAL FIXATION (ORIF) DISTAL HUMERUS FRACTURE;  Surgeon: Eulas Post, MD;  Location: [facility] OR;  Service: Orthopedics;  Laterality: Right;  ORIF RIGHT SUPRACHONDULAR HUMERUS FRACTURE  . TOTAL KNEE ARTHROPLASTY  01/2002   right     OB History    Gravida  0   Para  0   Term  0   Preterm  0   AB  0   Living  0     SAB  0   TAB  0   Ectopic  0   Multiple  0   Live Births               Home Medications    Prior to Admission medications   Medication Sig Start Date End Date Taking? Authorizing Provider  acetaminophen (TYLENOL) 325 MG tablet Take 2 tablets (650 mg total) every 6 (six) hours as needed by mouth for mild pain or fever. 07/06/17   Beryl Meager, NP  aspirin EC 81 MG EC tablet Take 1 tablet (81 mg total) daily by mouth. 07/06/17   Beryl Meager, NP  benzonatate (TESSALON) 200 MG capsule Take 1 capsule (200 mg total) by mouth 3 (three) times daily as needed for cough. 10/28/17   Ghimire, Werner Lean, MD  digoxin (LANOXIN) 0.125 MG tablet Take 1 tablet (125 mcg total) by mouth daily. 10/30/17 10/30/18  Leroy Sea, MD  guaiFENesin (MUCINEX) 600 MG 12 hr tablet Take  1 tablet (600 mg total) by mouth 2 (two) times daily. 10/28/17   Ghimire, Werner Lean, MD  metoprolol tartrate (LOPRESSOR) 50 MG tablet Take 1.5 tablets (75 mg total) by mouth 2 (two) times daily. 10/30/17 10/30/18  Leroy Sea, MD  pantoprazole (PROTONIX) 40 MG tablet Take 1 tablet (40 mg total) daily by mouth. 07/06/17   Beryl Meager, NP  polyethylene glycol (MIRALAX / GLYCOLAX) packet Take 17 g by mouth daily. 10/28/17   Ghimire, Werner Lean, MD  pravastatin (PRAVACHOL) 40 MG tablet Take 1 tablet (40 mg total) daily at 6 PM by mouth. 07/06/17   Beryl Meager, NP  QUEtiapine (SEROQUEL) 25  MG tablet Take 1 tablet (25 mg total) 2 (two) times daily by mouth. 07/06/17   Beryl Meager, NP    Family History Family History  Problem Relation Age of Onset  . Heart attack Mother   . Stroke Mother   . Hypertension Sister   . Anesthesia problems Neg Hx   . Hypotension Neg Hx   . Malignant hyperthermia Neg Hx   . Pseudochol deficiency Neg Hx     Social History Social History   Tobacco Use  . Smoking status: Never Smoker  . Smokeless tobacco: Never Used  Substance Use Topics  . Alcohol use: Yes    Alcohol/week: 3.0 oz    Types: 5 Glasses of wine per week  . Drug use: No     Allergies   Amoxicillin-pot clavulanate; Codeine; Escitalopram oxalate; Iron; Losartan potassium; Metronidazole; Raloxifene hcl; Tetracycline hcl; Other; Penicillins; Raloxifene; and Sulfa drugs cross reactors   Review of Systems Review of Systems  Unable to perform ROS: Mental status change     Physical Exam Updated Vital Signs BP (!) 119/59   Pulse (!) 41   Temp 97.6 F (36.4 C) (Oral)   Resp 13   Ht 5\' 6"  (1.676 m)   Wt 72.6 kg (160 lb)   SpO2 100%   BMI 25.82 kg/m   Physical Exam  Constitutional: She is oriented to person, place, and time. She appears well-developed and well-nourished. No distress.  HENT:  Head: Normocephalic and atraumatic.  Superficial abrasion to the bridge of the nose.  No  septal hematoma.  No deformity.  No other obvious head trauma.  Eyes: Conjunctivae are normal.  Neck: Neck supple.  Cardiovascular: Normal rate, regular rhythm and normal heart sounds. Exam reveals no friction rub.  No murmur heard. Pulmonary/Chest: Effort normal and breath sounds normal. No respiratory distress. She has no wheezes. She has no rales.  Abdominal: She exhibits no distension.  Musculoskeletal: She exhibits no edema.  Tenderness to palpation of the left hip and knee.  No obvious deformity.  Neurological: She is alert and oriented to person, place, and time. She exhibits normal muscle tone.  Skin: Skin is warm. Capillary refill takes less than 2 seconds.  Psychiatric: She has a normal mood and affect.  Nursing note and vitals reviewed.    ED Treatments / Results  Labs (all labs ordered are listed, but only abnormal results are displayed) Labs Reviewed  CBC WITH DIFFERENTIAL/PLATELET - Abnormal; Notable for the following components:      Result Value   RBC 3.56 (*)    Hemoglobin 9.6 (*)    HCT 31.8 (*)    RDW 17.5 (*)    All other components within normal limits  BASIC METABOLIC PANEL - Abnormal; Notable for the following components:   Glucose, Bld 101 (*)    Creatinine, Ser 1.05 (*)    Calcium 7.6 (*)    GFR calc non Af Amer 46 (*)    GFR calc Af Amer 54 (*)    All other components within normal limits  MAGNESIUM  TROPONIN I  DIGOXIN LEVEL    EKG EKG Interpretation  Date/Time:  Thursday Dec 28 2017 11:15:48 EDT Ventricular Rate:  47 PR Interval:    QRS Duration: 90 QT Interval:  432 QTC Calculation: 382 R Axis:   27 Text Interpretation:  Atrial fibrillation Borderline repol abnormality, diffuse leads Since prior EKG, rate has decreased Otherwise no significant change Confirmed by Shaune Pollack [phone]) on 12/28/2017 11:19:27 AM  Radiology Dg Chest 2 View  Result Date: 12/28/2017 CLINICAL DATA:  Fall. EXAM: CHEST - 2 VIEW COMPARISON:  Radiographs of  November 07, 2017. FINDINGS: Stable cardiomegaly. Atherosclerosis of thoracic aorta is noted. No pneumothorax or pleural effusion is noted. No acute pulmonary disease is noted. Bony thorax is unremarkable. IMPRESSION: No active cardiopulmonary disease. Aortic Atherosclerosis (ICD10-I70.0). Electronically Signed   By: Lupita Raider, M.D.   On: 12/28/2017 12:28   Dg Pelvis 1-2 Views  Result Date: 12/28/2017 CLINICAL DATA:  Pain after fall. EXAM: PELVIS - 1-2 VIEW COMPARISON:  None. FINDINGS: There is no evidence of pelvic fracture or diastasis. No pelvic bone lesions are seen. IMPRESSION: Normal pelvis. Electronically Signed   By: Lupita Raider, M.D.   On: 12/28/2017 12:29   Ct Head Wo Contrast  Result Date: 12/28/2017 CLINICAL DATA:  Patient fell and has an abrasion to the bridge of her nose. Patient has dementia and unable to give any history. "she just says she doesn't know if she is ok" EXAM: CT HEAD WITHOUT CONTRAST CT CERVICAL SPINE WITHOUT CONTRAST TECHNIQUE: Multidetector CT imaging of the head and cervical spine was performed following the standard protocol without intravenous contrast. Multiplanar CT image reconstructions of the cervical spine were also generated. COMPARISON:  None. FINDINGS: CT HEAD FINDINGS Brain: No evidence of acute infarction, hemorrhage, extra-axial collection, ventriculomegaly, or mass effect. Generalized cerebral atrophy. Periventricular white matter low attenuation likely secondary to microangiopathy. Vascular: Cerebrovascular atherosclerotic calcifications are noted. Skull: Negative for fracture or focal lesion. Sinuses/Orbits: Visualized portions of the orbits are unremarkable. Visualized portions of the paranasal sinuses and mastoid air cells are unremarkable. Other: None. CT CERVICAL SPINE FINDINGS Alignment: 3 mm anterolisthesis of C3 on C4. Skull base and vertebrae: No acute fracture. No primary bone lesion or focal pathologic process. Soft tissues and spinal canal: No  prevertebral fluid or swelling. No visible canal hematoma. Disc levels: Degenerative disease disc height loss at C5-6 and C6-7. at C2-3 there is moderate right facet arthropathy. Severe right and moderate left facet arthropathy at C3-4 with right foraminal narrowing. Severe right facet arthropathy at C4-5. Moderate bilateral facet arthropathy at C5-6 with bilateral uncovertebral degenerative changes and bilateral foraminal narrowing. Upper chest: Lung apices are clear. Other: Bilateral carotid artery atherosclerosis. IMPRESSION: 1. No acute intracranial pathology. 2.  No acute osseous injury of the cervical spine. Electronically Signed   By: Elige Ko   On: 12/28/2017 11:57   Ct Cervical Spine Wo Contrast  Result Date: 12/28/2017 CLINICAL DATA:  Patient fell and has an abrasion to the bridge of her nose. Patient has dementia and unable to give any history. "she just says she doesn't know if she is ok" EXAM: CT HEAD WITHOUT CONTRAST CT CERVICAL SPINE WITHOUT CONTRAST TECHNIQUE: Multidetector CT imaging of the head and cervical spine was performed following the standard protocol without intravenous contrast. Multiplanar CT image reconstructions of the cervical spine were also generated. COMPARISON:  None. FINDINGS: CT HEAD FINDINGS Brain: No evidence of acute infarction, hemorrhage, extra-axial collection, ventriculomegaly, or mass effect. Generalized cerebral atrophy. Periventricular white matter low attenuation likely secondary to microangiopathy. Vascular: Cerebrovascular atherosclerotic calcifications are noted. Skull: Negative for fracture or focal lesion. Sinuses/Orbits: Visualized portions of the orbits are unremarkable. Visualized portions of the paranasal sinuses and mastoid air cells are unremarkable. Other: None. CT CERVICAL SPINE FINDINGS Alignment: 3 mm anterolisthesis of C3 on C4. Skull base and vertebrae: No acute fracture. No primary bone lesion or focal pathologic process. Soft tissues  and  spinal canal: No prevertebral fluid or swelling. No visible canal hematoma. Disc levels: Degenerative disease disc height loss at C5-6 and C6-7. at C2-3 there is moderate right facet arthropathy. Severe right and moderate left facet arthropathy at C3-4 with right foraminal narrowing. Severe right facet arthropathy at C4-5. Moderate bilateral facet arthropathy at C5-6 with bilateral uncovertebral degenerative changes and bilateral foraminal narrowing. Upper chest: Lung apices are clear. Other: Bilateral carotid artery atherosclerosis. IMPRESSION: 1. No acute intracranial pathology. 2.  No acute osseous injury of the cervical spine. Electronically Signed   By: Elige Ko   On: 12/28/2017 11:57   Dg Femur Min 2 Views Left  Result Date: 12/28/2017 CLINICAL DATA:  Left leg pain after fall. EXAM: LEFT FEMUR 2 VIEWS COMPARISON:  None. FINDINGS: There is no evidence of fracture or other focal bone lesions. Vascular calcifications are noted. IMPRESSION: Normal left femur. Electronically Signed   By: Lupita Raider, M.D.   On: 12/28/2017 12:30    Procedures Procedures (including critical care time)  Medications Ordered in ED Medications  calcium gluconate 1 g in sodium chloride 0.9 % 100 mL IVPB (1 g Intravenous Not Given 12/28/17 1516)     Initial Impression / Assessment and Plan / ED Course  I have reviewed the triage vital signs and the nursing notes.  Pertinent labs & imaging results that were available during my care of the patient were reviewed by me and considered in my medical decision making (see chart for details).     84 yo F here with fall. H/o dementia but per report from facility, h/o falls and fell while walking this AM. She was at baseline prior to fall. On arrival here, pt bradycardic but BP normal, mentating at baseline, ambulatory w/o difficulty. Lab work reassuring. Electrolytes at baseline. EKG shows afib with slow VR, no ischemia and trop neg. Dig level therapeutic. CXR clear.  Imaging from fall is negative.   While monitoring in ED, HR improving to 60s. She has a well documented h/o AFib RVR so I suspect this is 2/2 beta blockade. Will have her decrease her metop dose. Discussed with her HCPOA who is aware and in agreement. Instructions provided to facility.  Final Clinical Impressions(s) / ED Diagnoses   Final diagnoses:  Fall, initial encounter  Bradycardia      Shaune Pollack, MD 12/28/17 [phone]

## 2017-12-28 NOTE — ED Triage Notes (Signed)
DR isacs aware PT pulled out IV . PER DEP PT can take tums.

## 2018-02-07 DIAGNOSIS — I481 Persistent atrial fibrillation: Secondary | ICD-10-CM | POA: Diagnosis not present

## 2018-02-07 DIAGNOSIS — R69 Illness, unspecified: Secondary | ICD-10-CM | POA: Diagnosis not present

## 2018-02-07 DIAGNOSIS — R7303 Prediabetes: Secondary | ICD-10-CM | POA: Diagnosis not present

## 2018-02-07 DIAGNOSIS — N183 Chronic kidney disease, stage 3 (moderate): Secondary | ICD-10-CM | POA: Diagnosis not present

## 2018-02-07 DIAGNOSIS — G301 Alzheimer's disease with late onset: Secondary | ICD-10-CM | POA: Diagnosis not present

## 2018-02-07 DIAGNOSIS — E7800 Pure hypercholesterolemia, unspecified: Secondary | ICD-10-CM | POA: Diagnosis not present

## 2018-02-23 ENCOUNTER — Ambulatory Visit: Payer: Medicare HMO | Admitting: Podiatry

## 2018-02-23 ENCOUNTER — Encounter: Payer: Self-pay | Admitting: Podiatry

## 2018-02-23 DIAGNOSIS — M79675 Pain in left toe(s): Secondary | ICD-10-CM

## 2018-02-23 DIAGNOSIS — B351 Tinea unguium: Secondary | ICD-10-CM

## 2018-02-23 DIAGNOSIS — M79674 Pain in right toe(s): Secondary | ICD-10-CM | POA: Diagnosis not present

## 2018-02-23 NOTE — Progress Notes (Signed)
Complaint:  Visit Type: Patient presents  to my office for continued preventative foot care services. Complaint: Patient states" my nails have grown long and thick and become painful to walk and wear shoes"  The patient presents for preventative foot care services. No changes to ROS.  Marland Kitchen  Podiatric Exam: Vascular: dorsalis pedis  Are weakly.  palpable bilateral.  Posterior tibial pulses are absent  B/L. Capillary return is immediate. Temperature gradient is WNL. Skin turgor WNL  Sensorium: Normal Semmes Weinstein monofilament test. Normal tactile sensation bilaterally. Nail Exam: Pt has thick disfigured discolored nails with subungual debris noted bilateral entire nail hallux through fifth toenails Ulcer Exam: There is no evidence of ulcer or pre-ulcerative changes or infection. Orthopedic Exam: Muscle tone and strength are WNL. No limitations in general ROM. No crepitus or effusions noted. Foot type and digits show no abnormalities. Bony prominences are unremarkable. Skin: No Porokeratosis. No infection or ulcers  Diagnosis:  Onychomycosis, , Pain in right toe, pain in left toes  Treatment & Plan Procedures and Treatment: Consent by patient was obtained for treatment procedures.   Debridement of mycotic and hypertrophic toenails, 1 through 5 bilateral and clearing of subungual debris. No ulceration, no infection noted.  Return Visit-Office Procedure: Patient instructed to return to the office for a follow up visit 3 months for continued evaluation and treatment.    Helane Gunther DPM

## 2018-03-16 ENCOUNTER — Emergency Department (HOSPITAL_COMMUNITY): Payer: Medicare HMO

## 2018-03-16 ENCOUNTER — Emergency Department (HOSPITAL_COMMUNITY)
Admission: EM | Admit: 2018-03-16 | Discharge: 2018-03-16 | Disposition: A | Discharge status: Home / Self Care | Payer: Medicare HMO | Attending: Emergency Medicine | Admitting: Emergency Medicine

## 2018-03-16 ENCOUNTER — Other Ambulatory Visit: Payer: Self-pay

## 2018-03-16 DIAGNOSIS — R410 Disorientation, unspecified: Secondary | ICD-10-CM | POA: Diagnosis not present

## 2018-03-16 DIAGNOSIS — R279 Unspecified lack of coordination: Secondary | ICD-10-CM | POA: Diagnosis not present

## 2018-03-16 DIAGNOSIS — M79651 Pain in right thigh: Secondary | ICD-10-CM | POA: Diagnosis not present

## 2018-03-16 DIAGNOSIS — Y999 Unspecified external cause status: Secondary | ICD-10-CM | POA: Insufficient documentation

## 2018-03-16 DIAGNOSIS — Z955 Presence of coronary angioplasty implant and graft: Secondary | ICD-10-CM | POA: Diagnosis not present

## 2018-03-16 DIAGNOSIS — Y92129 Unspecified place in nursing home as the place of occurrence of the external cause: Secondary | ICD-10-CM | POA: Diagnosis not present

## 2018-03-16 DIAGNOSIS — I4891 Unspecified atrial fibrillation: Secondary | ICD-10-CM | POA: Diagnosis not present

## 2018-03-16 DIAGNOSIS — T50904A Poisoning by unspecified drugs, medicaments and biological substances, undetermined, initial encounter: Secondary | ICD-10-CM | POA: Diagnosis not present

## 2018-03-16 DIAGNOSIS — I110 Hypertensive heart disease with heart failure: Secondary | ICD-10-CM | POA: Insufficient documentation

## 2018-03-16 DIAGNOSIS — Z79899 Other long term (current) drug therapy: Secondary | ICD-10-CM | POA: Insufficient documentation

## 2018-03-16 DIAGNOSIS — Z96651 Presence of right artificial knee joint: Secondary | ICD-10-CM | POA: Insufficient documentation

## 2018-03-16 DIAGNOSIS — R69 Illness, unspecified: Secondary | ICD-10-CM | POA: Diagnosis not present

## 2018-03-16 DIAGNOSIS — M25551 Pain in right hip: Secondary | ICD-10-CM | POA: Insufficient documentation

## 2018-03-16 DIAGNOSIS — Z743 Need for continuous supervision: Secondary | ICD-10-CM | POA: Diagnosis not present

## 2018-03-16 DIAGNOSIS — R296 Repeated falls: Secondary | ICD-10-CM | POA: Insufficient documentation

## 2018-03-16 DIAGNOSIS — R319 Hematuria, unspecified: Secondary | ICD-10-CM | POA: Diagnosis not present

## 2018-03-16 DIAGNOSIS — I2510 Atherosclerotic heart disease of native coronary artery without angina pectoris: Secondary | ICD-10-CM | POA: Diagnosis not present

## 2018-03-16 DIAGNOSIS — S79921A Unspecified injury of right thigh, initial encounter: Secondary | ICD-10-CM | POA: Diagnosis not present

## 2018-03-16 DIAGNOSIS — Z7982 Long term (current) use of aspirin: Secondary | ICD-10-CM | POA: Diagnosis not present

## 2018-03-16 DIAGNOSIS — W19XXXA Unspecified fall, initial encounter: Secondary | ICD-10-CM | POA: Insufficient documentation

## 2018-03-16 DIAGNOSIS — S299XXA Unspecified injury of thorax, initial encounter: Secondary | ICD-10-CM | POA: Diagnosis not present

## 2018-03-16 DIAGNOSIS — I5020 Unspecified systolic (congestive) heart failure: Secondary | ICD-10-CM | POA: Diagnosis not present

## 2018-03-16 DIAGNOSIS — F0390 Unspecified dementia without behavioral disturbance: Secondary | ICD-10-CM | POA: Insufficient documentation

## 2018-03-16 DIAGNOSIS — M25561 Pain in right knee: Secondary | ICD-10-CM | POA: Diagnosis not present

## 2018-03-16 DIAGNOSIS — R079 Chest pain, unspecified: Secondary | ICD-10-CM | POA: Diagnosis not present

## 2018-03-16 DIAGNOSIS — S8991XA Unspecified injury of right lower leg, initial encounter: Secondary | ICD-10-CM | POA: Diagnosis not present

## 2018-03-16 DIAGNOSIS — T887XXA Unspecified adverse effect of drug or medicament, initial encounter: Secondary | ICD-10-CM | POA: Diagnosis not present

## 2018-03-16 DIAGNOSIS — R0902 Hypoxemia: Secondary | ICD-10-CM | POA: Diagnosis not present

## 2018-03-16 DIAGNOSIS — N390 Urinary tract infection, site not specified: Secondary | ICD-10-CM

## 2018-03-16 DIAGNOSIS — Y9301 Activity, walking, marching and hiking: Secondary | ICD-10-CM | POA: Insufficient documentation

## 2018-03-16 DIAGNOSIS — S79911A Unspecified injury of right hip, initial encounter: Secondary | ICD-10-CM | POA: Diagnosis not present

## 2018-03-16 DIAGNOSIS — R55 Syncope and collapse: Secondary | ICD-10-CM | POA: Diagnosis not present

## 2018-03-16 LAB — URINALYSIS, ROUTINE W REFLEX MICROSCOPIC
BILIRUBIN URINE: NEGATIVE
Glucose, UA: NEGATIVE mg/dL
KETONES UR: 5 mg/dL — AB
NITRITE: POSITIVE — AB
PH: 6 (ref 5.0–8.0)
Protein, ur: NEGATIVE mg/dL
Specific Gravity, Urine: 1.009 (ref 1.005–1.030)

## 2018-03-16 LAB — BASIC METABOLIC PANEL
Anion gap: 8 (ref 5–15)
BUN: 14 mg/dL (ref 8–23)
CHLORIDE: 106 mmol/L (ref 98–111)
CO2: 29 mmol/L (ref 22–32)
CREATININE: 1.03 mg/dL — AB (ref 0.44–1.00)
Calcium: 8.5 mg/dL — ABNORMAL LOW (ref 8.9–10.3)
GFR calc Af Amer: 55 mL/min — ABNORMAL LOW (ref >60)
GFR calc non Af Amer: 47 mL/min — ABNORMAL LOW (ref >60)
Glucose, Bld: 97 mg/dL (ref 70–99)
POTASSIUM: 4.3 mmol/L (ref 3.5–5.1)
Sodium: 143 mmol/L (ref 135–145)

## 2018-03-16 LAB — CBC
HCT: 37.9 % (ref 36.0–46.0)
Hemoglobin: 11.1 g/dL — ABNORMAL LOW (ref 12.0–15.0)
MCH: 25.9 pg — ABNORMAL LOW (ref 26.0–34.0)
MCHC: 29.3 g/dL — AB (ref 30.0–36.0)
MCV: 88.6 fL (ref 78.0–100.0)
Platelets: 149 10*3/uL — ABNORMAL LOW (ref 150–400)
RBC: 4.28 MIL/uL (ref 3.87–5.11)
RDW: 18.3 % — ABNORMAL HIGH (ref 11.5–15.5)
WBC: 6.7 10*3/uL (ref 4.0–10.5)

## 2018-03-16 LAB — I-STAT TROPONIN, ED
TROPONIN I, POC: 0 ng/mL (ref 0.00–0.08)
Troponin i, poc: 0.02 ng/mL (ref 0.00–0.08)

## 2018-03-16 MED ORDER — CEPHALEXIN 500 MG PO CAPS: 500.0000 mg | ORAL_CAPSULE | Freq: Two times a day (BID) | ORAL | 0 refills | Status: AC

## 2018-03-16 MED ORDER — CEPHALEXIN 250 MG PO CAPS
500.0000 mg | ORAL_CAPSULE | ORAL | Status: AC
Start: 2018-03-16 — End: 2018-03-16
  Administered 2018-03-16: 500 mg via ORAL
  Filled 2018-03-16: qty 2

## 2018-03-16 NOTE — ED Notes (Addendum)
Pt is unable to speak of what happened but does endorse right hip pain. No shortening or rotation noted.

## 2018-03-16 NOTE — ED Triage Notes (Signed)
Patient found down in dayroom of SNF. Upon arrival by EMS patient semi-responsive. SNF not able to tell EMS what her baseline is. Patient has facial bruise from previous fall and states she has pain in leg and hip area.

## 2018-03-16 NOTE — ED Provider Notes (Signed)
[facility] EMERGENCY DEPARTMENT Provider Note   CSN: [id number] Arrival date & time: 03/16/18  1642  History   Chief Complaint Chief Complaint  Patient presents with  . Fall   HPI  Patient is an 84 year old female with history of CAD, atrial fibrillation, and dementia presenting to the ED from [facility] memory care unit for fall. History markedly limited by patient's baseline dementia. History obtained from staff at facility who I called personally. Per their report, she had been just seen walking in the last few minutes prior to being found on a carpeted floor unresponsive. They state they had to perform sternal rub and she regained consciousness within 1-2 minutes. She has otherwise been at baseline today with no known recent illness or injury. She normally ambulates with a walker. She is able to answer some yes/no questions and denies any chest pain, back pain, or trouble breathing.  Past Medical History:  Diagnosis Date  . Anemia    iron deficient  . Anxiety   . Atrial fibrillation (HCC)    per daughter  . CAD (coronary artery disease)    a. s/p Inf STEMI 2/14 => LHC 10/18/12: Diffuse distal LM 30%, LAD 50% calcified stenosis just after first septal perforator, diagonal branches with mild 20-30% stenosis, mid RCA 99%. PCI: Promus Premier DES to the mid RCA  . Carotid stenosis    a. [employer] screening done in 2009 with carotid stenosis noted;  b.  Carotid dopplers 3/14: R 0-39%, L 40-59%, repeat 1 year  . Chronic lower back pain   . 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 Dementia    needs redirection freq.  . Depression   . Hypertension   . Ischemic cardiomyopathy    a. Echocardiogram 10/18/12: Mild LVH, inferior and inferoseptal AK, posterior HK, EF 45%, grade 1 diastolic dysfunction, aortic sclerosis without stenosis, mild MR, mild LAE, RV systolic function moderately reduced (pacer  wire in right ventricle), PASP 32  . Migraines    "haven't had one since going thru menopause"  . Rheumatoid arthritis(714.0)   . Upper GI bleeding 09/14/11   "have had it once before today"    Patient Active Problem List   Diagnosis Date Noted  . Atrial fibrillation with RVR (HCC) 10/26/2017  . Wheezing   . Weakness   . Palliative care by specialist   . Goals of care, counseling/discussion   . Acute pneumonia 10/22/2017  . Shortness of breath 10/22/2017  . Carotid stenosis, bilateral 07/05/2017  . Cerebral embolism with cerebral infarction 07/04/2017  . Stroke (cerebrum) (HCC) 07/04/2017  . Physical deconditioning 10/23/2012  . Cardiogenic shock (HCC) 10/23/2012  . Wenckebach second degree AV block 10/20/2012  . Complete heart block (HCC) 10/19/2012  . Acute renal failure (HCC) 10/19/2012  . Acute lower UTI 10/19/2012  . Acute systolic CHF (congestive heart failure) (HCC) 10/19/2012  . Normocytic anemia 10/19/2012  . S/P drug eluting coronary stent placement 10/19/2012  . Coronary artery disease 10/19/2012  . Acute MI, inferior wall, initial episode of care (HCC) 10/18/2012  . Urinary tract infection, E. coli 08/21/2012  . Dementia   . Depression   . Prepyloric ulcer 09/15/2011  . Hypotension due to blood loss 09/15/2011  . Leukocytosis 09/15/2011  . Extrarenal azotemia 09/15/2011  . GI bleeding 09/14/2011  . Anemia associated with acute blood loss 09/14/2011  . Tachycardia 09/14/2011  . HTN (hypertension) 09/14/2011  . Anxiety 09/14/2011  .  ETOH abuse 09/14/2011  . 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 09/14/2011    Past Surgical History:  Procedure Laterality Date  . BACK SURGERY    . BREAST LUMPECTOMY     "benign tumor removed"  . ESOPHAGOGASTRODUODENOSCOPY  09/15/2011   Procedure: ESOPHAGOGASTRODUODENOSCOPY (EGD);  Surgeon: Barrie Folk, MD;  Location: [facility] ENDOSCOPY;  Service: Endoscopy;   Laterality: N/A;  . HARVEST BONE GRAFT  08/17/2012   Procedure: HARVEST ILIAC BONE GRAFT;  Surgeon: Eulas Post, MD;  Location: [facility] OR;  Service: Orthopedics;  Laterality: Right;  . HEMATOMA EVACUATION  10/2002   lumbar  . INCISION AND DRAINAGE OF WOUND  10/2002   lumbar wound infection  . JOINT REPLACEMENT    . KNEE ARTHROSCOPY  01/2001   right  . LEFT HEART CATHETERIZATION WITH CORONARY ANGIOGRAM N/A 10/18/2012   Procedure: LEFT HEART CATHETERIZATION WITH CORONARY ANGIOGRAM;  Surgeon: Tonny Bollman, MD;  Location: [facility] CATH LAB;  Service: Cardiovascular;  Laterality: N/A;  . LUMBAR DISC SURGERY  09/2002   Hemilaminectomy, L3, complete laminectomy, L4, hemi- semilaminectomy, L5 and right S1, and diskectomy, L5-S1.  Marland Kitchen LUMBAR LAMINECTOMY  10/2003   for epidural abscess evacuation; L4-5  . ORIF HUMERUS FRACTURE  08/17/2012   Procedure: OPEN REDUCTION INTERNAL FIXATION (ORIF) DISTAL HUMERUS FRACTURE;  Surgeon: Eulas Post, MD;  Location: [facility] OR;  Service: Orthopedics;  Laterality: Right;  ORIF RIGHT SUPRACHONDULAR HUMERUS FRACTURE  . TOTAL KNEE ARTHROPLASTY  01/2002   right     OB History    Gravida  0   Para  0   Term  0   Preterm  0   AB  0   Living  0     SAB  0   TAB  0   Ectopic  0   Multiple  0   Live Births               Home Medications    Prior to Admission medications   Medication Sig Start Date End Date Taking? Authorizing Provider  acetaminophen (TYLENOL) 325 MG tablet Take 2 tablets (650 mg total) every 6 (six) hours as needed by mouth for mild pain or fever. 07/06/17  Yes Costello, Lamar Blinks, NP  aspirin EC 81 MG EC tablet Take 1 tablet (81 mg total) daily by mouth. 07/06/17  Yes Costello, Lamar Blinks, NP  digoxin (LANOXIN) 0.125 MG tablet Take 1 tablet (125 mcg total) by mouth daily. 10/30/17 10/30/18 Yes Leroy Sea, MD  divalproex (DEPAKOTE) 250 MG DR tablet Take 250 mg by mouth 2 (two) times daily.   Yes [provider]  metoprolol tartrate  (LOPRESSOR) 50 MG tablet Take 1.5 tablets (75 mg total) by mouth 2 (two) times daily. Patient taking differently: Take 50 mg by mouth 2 (two) times daily.  10/30/17 10/30/18 Yes Leroy Sea, MD  pantoprazole (PROTONIX) 40 MG tablet Take 1 tablet (40 mg total) daily by mouth. 07/06/17  Yes Costello, Lamar Blinks, NP  Polyethylene Glycol 3350 (PEG 3350) POWD Take 17 g by mouth See admin instructions. Mix 17 grams into 8 ounces of water and drink once a day   Yes [provider]  pravastatin (PRAVACHOL) 40 MG tablet Take 1 tablet (40 mg total) daily at 6 PM by mouth. 07/06/17  Yes Costello, Lamar Blinks, NP  QUEtiapine (SEROQUEL) 25 MG tablet Take 1 tablet (25 mg total) 2 (two) times daily by mouth. 07/06/17  Yes Costello, Mary A, NP  benzonatate (TESSALON) 200 MG capsule Take 1 capsule (200 mg total) by mouth 3 (three) times daily as needed for cough. Patient not taking: Reported on 03/16/2018 10/28/17   Maretta Bees, MD  cephALEXin (KEFLEX) 500 MG capsule Take 1 capsule (500 mg total) by mouth 2 (two) times daily for 5 days. 03/17/18 03/22/18  Cecille Po, MD  guaiFENesin (MUCINEX) 600 MG 12 hr tablet Take 1 tablet (600 mg total) by mouth 2 (two) times daily. Patient not taking: Reported on 03/16/2018 10/28/17   Maretta Bees, MD  polyethylene glycol [facility] / Ethelene Hal) packet Take 17 g by mouth daily. Patient not taking: Reported on 03/16/2018 10/28/17   Maretta Bees, MD    Family History Family History  Problem Relation Age of Onset  . Heart attack Mother   . Stroke Mother   . Hypertension Sister   . Anesthesia problems Neg Hx   . Hypotension Neg Hx   . Malignant hyperthermia Neg Hx   . Pseudochol deficiency Neg Hx     Social History Social History   Tobacco Use  . Smoking status: Never Smoker  . Smokeless tobacco: Never Used  Substance Use Topics  . Alcohol use: Yes    Alcohol/week: 3.0 oz    Types: 5 Glasses of wine per week  . Drug use: No     Allergies     Amoxicillin-pot clavulanate; Codeine; Escitalopram oxalate; Iron; Losartan potassium; Metronidazole; Raloxifene hcl; Tetracycline hcl; Penicillins; Raloxifene; and Sulfa drugs cross reactors   Review of Systems Review of Systems  Unable to perform ROS: Dementia     Physical Exam Updated Vital Signs BP (!) 169/76 (BP Location: Right Arm)   Pulse 85   Temp 98.6 F (37 C) (Oral)   Resp 16   SpO2 95%   Physical Exam  Constitutional: No distress.  HENT:  Head: Normocephalic.  Mouth/Throat: Oropharynx is clear and moist.  Old ecchymosis over left forehead  Eyes: Pupils are equal, round, and reactive to light. Conjunctivae are normal.  Neck: Neck supple. No tracheal deviation present.  Cardiovascular: Normal rate, normal heart sounds and intact distal pulses. An irregular rhythm present.  No murmur heard. Pulmonary/Chest: Effort normal and breath sounds normal. No stridor. No respiratory distress. She has no wheezes. She has no rales. She exhibits no tenderness.  Abdominal: Soft. She exhibits no distension and no mass. There is no tenderness. There is no guarding.  Musculoskeletal: She exhibits no edema or deformity.  Limited ROM of the right hip with tenderness. Small ecchymosis over the right knee. Extremities otherwise atraumatic with full painless ROM and no deformities. No midline or paraspinal tenderness along the C, T, or L-spine.  Neurological: She is alert.  Disoriented to time. Speech is fluent. Hard of hearing. Pleasantly confused.  Skin: Skin is warm and dry.  Nursing note and vitals reviewed.    ED Treatments / Results  Labs (all labs ordered are listed, but only abnormal results are displayed) Labs Reviewed  BASIC METABOLIC PANEL - Abnormal; Notable for the following components:      Result Value   Creatinine, Ser 1.03 (*)    Calcium 8.5 (*)    GFR calc non Af Amer 47 (*)    GFR calc Af Amer 55 (*)    All other components within normal limits  CBC -  Abnormal; Notable for the following components:   Hemoglobin 11.1 (*)    MCH 25.9 (*)  MCHC 29.3 (*)    RDW 18.3 (*)    Platelets 149 (*)    All other components within normal limits  URINALYSIS, ROUTINE W REFLEX MICROSCOPIC - Abnormal; Notable for the following components:   Hgb urine dipstick MODERATE (*)    Ketones, ur 5 (*)    Nitrite POSITIVE (*)    Leukocytes, UA LARGE (*)    Bacteria, UA RARE (*)    All other components within normal limits  I-STAT TROPONIN, ED  I-STAT TROPONIN, ED    EKG EKG Interpretation  Date/Time:  Friday March 16 2018 16:44:28 EDT Ventricular Rate:  67 PR Interval:    QRS Duration: 95 QT Interval:  357 QTC Calculation: 377 R Axis:   56 Text Interpretation:  Atrial fibrillation Ventricular premature complex Repol abnrm, severe global ischemia (LM/MVD) when compared to prior, similar Afib with artifact and PVC.  No STEMI Confirmed by Theda Belfast ([id number]) on 03/16/2018 8:59:17 PM   Radiology Dg Chest 2 View  Result Date: 03/16/2018 CLINICAL DATA:  Pain following fall EXAM: CHEST - 2 VIEW COMPARISON:  Dec 28, 2017 November 07, 2017 FINDINGS: No edema or consolidation. Heart mildly enlarged with pulmonary vascularity normal. No adenopathy. There is aortic atherosclerosis. There is anterior wedging of several thoracic vertebral bodies. IMPRESSION: No edema or consolidation. Stable cardiac prominence. There is aortic atherosclerosis. Aortic Atherosclerosis (ICD10-I70.0). Electronically Signed   By: Bretta Bang III M.D.   On: 03/16/2018 18:22   Dg Knee 2 Views Right  Result Date: 03/16/2018 CLINICAL DATA:  Pain following fall EXAM: RIGHT KNEE - 1-2 VIEW COMPARISON:  December 13, 2017 FINDINGS: Frontal and lateral views obtained. Patient is status post total knee replacement with prosthetic components well-seated. No acute fracture or dislocation. No joint effusion. No erosive change. IMPRESSION: Total knee replacement with prosthetic components  well-seated. No fracture or dislocation. No joint effusion. Electronically Signed   By: Bretta Bang III M.D.   On: 03/16/2018 18:21   Ct Head Wo Contrast  Result Date: 03/16/2018 CLINICAL DATA:  Patient found down EXAM: CT HEAD WITHOUT CONTRAST CT CERVICAL SPINE WITHOUT CONTRAST TECHNIQUE: Multidetector CT imaging of the head and cervical spine was performed following the standard protocol without intravenous contrast. Multiplanar CT image reconstructions of the cervical spine were also generated. COMPARISON:  None. FINDINGS: CT HEAD FINDINGS Brain: There is no mass, hemorrhage or extra-axial collection. There is generalized atrophy without lobar predilection. There is no acute or chronic infarction. There is hypoattenuation of the periventricular white matter, most commonly indicating chronic ischemic microangiopathy. Vascular: Atherosclerotic calcification of the internal carotid arteries at the skull base. No abnormal hyperdensity of the major intracranial arteries or dural venous sinuses. Skull: The visualized skull base, calvarium and extracranial soft tissues are normal. Sinuses/Orbits: No fluid levels or advanced mucosal thickening of the visualized paranasal sinuses. No mastoid or middle ear effusion. The orbits are normal. CT CERVICAL SPINE FINDINGS Alignment: Grade 1 anterolisthesis at C3-C4 secondary to facet hypertrophy. Skull base and vertebrae: No acute fracture. Soft tissues and spinal canal: No prevertebral fluid or swelling. No visible canal hematoma. Disc levels:Disc space narrowing is greatest at C5-6 and C6-7 with associated uncovertebral hypertrophy. Facet disease is greatest at the right C3-C5 levels. No advanced spinal canal or neural foraminal stenosis. Upper chest: No pneumothorax, pulmonary nodule or pleural effusion. Other: Normal visualized paraspinal cervical soft tissues. IMPRESSION: 1. No acute intracranial or cervical spine abnormality. 2. Generalized brain atrophy and  sequelae of chronic ischemic microangiopathy. 3. Multilevel  degenerative disc disease and facet arthrosis. Electronically Signed   By: Deatra Robinson M.D.   On: 03/16/2018 19:22   Ct Cervical Spine Wo Contrast  Result Date: 03/16/2018 CLINICAL DATA:  Patient found down EXAM: CT HEAD WITHOUT CONTRAST CT CERVICAL SPINE WITHOUT CONTRAST TECHNIQUE: Multidetector CT imaging of the head and cervical spine was performed following the standard protocol without intravenous contrast. Multiplanar CT image reconstructions of the cervical spine were also generated. COMPARISON:  None. FINDINGS: CT HEAD FINDINGS Brain: There is no mass, hemorrhage or extra-axial collection. There is generalized atrophy without lobar predilection. There is no acute or chronic infarction. There is hypoattenuation of the periventricular white matter, most commonly indicating chronic ischemic microangiopathy. Vascular: Atherosclerotic calcification of the internal carotid arteries at the skull base. No abnormal hyperdensity of the major intracranial arteries or dural venous sinuses. Skull: The visualized skull base, calvarium and extracranial soft tissues are normal. Sinuses/Orbits: No fluid levels or advanced mucosal thickening of the visualized paranasal sinuses. No mastoid or middle ear effusion. The orbits are normal. CT CERVICAL SPINE FINDINGS Alignment: Grade 1 anterolisthesis at C3-C4 secondary to facet hypertrophy. Skull base and vertebrae: No acute fracture. Soft tissues and spinal canal: No prevertebral fluid or swelling. No visible canal hematoma. Disc levels:Disc space narrowing is greatest at C5-6 and C6-7 with associated uncovertebral hypertrophy. Facet disease is greatest at the right C3-C5 levels. No advanced spinal canal or neural foraminal stenosis. Upper chest: No pneumothorax, pulmonary nodule or pleural effusion. Other: Normal visualized paraspinal cervical soft tissues. IMPRESSION: 1. No acute intracranial or cervical spine  abnormality. 2. Generalized brain atrophy and sequelae of chronic ischemic microangiopathy. 3. Multilevel degenerative disc disease and facet arthrosis. Electronically Signed   By: Deatra Robinson M.D.   On: 03/16/2018 19:22   Dg Hip Unilat  With Pelvis 2-3 Views Right  Result Date: 03/16/2018 CLINICAL DATA:  Pain following fall EXAM: DG HIP (WITH OR WITHOUT PELVIS) 2-3V RIGHT COMPARISON:  None. FINDINGS: Frontal pelvis as well as frontal and lateral right hip images were obtained. There is no appreciable acute fracture or dislocation. Joint spaces appear unremarkable. There is degenerative change in the lower lumbar spine. No erosive change. There are foci of vascular calcification in the pelvis. IMPRESSION: Degenerative change in lower lumbar spine. No acute fracture or dislocation. No appreciable joint space narrowing or erosion. Foci of arterial vascular calcification noted. Electronically Signed   By: Bretta Bang III M.D.   On: 03/16/2018 18:19   Dg Femur Min 2 Views Right  Result Date: 03/16/2018 CLINICAL DATA:  Pain following fall EXAM: RIGHT FEMUR 2 VIEWS COMPARISON:  None. FINDINGS: Frontal and lateral views were obtained. There is no demonstrable fracture or dislocation. There is a total knee replacement with prosthetic components well-seated. No appreciable hip joint arthropathy. There is superficial femoral artery atherosclerotic calcification. IMPRESSION: Fracture or dislocation. Total knee replacement with prosthetic components well-seated. No appreciable arthropathy. There is superficial femoral artery vascular atherosclerotic calcification. Electronically Signed   By: Bretta Bang III M.D.   On: 03/16/2018 18:20    Procedures Procedures (including critical care time)  Medications Ordered in ED Medications  cephALEXin (KEFLEX) capsule 500 mg (500 mg Oral Given 03/16/18 2258)     Initial Impression / Assessment and Plan / ED Course  I have reviewed the triage vital signs  and the nursing notes.  Pertinent labs & imaging results that were available during my care of the patient were reviewed by me and considered in my medical  decision making (see chart for details).  Patient is an 84 year old female with history of dementia presenting to the ED for unwitnessed fall as above. Her screening evaluation here is overall reassuring. CT head and cervical spine showed no acute traumatic findings. She did have some right hip tenderness for which we obtained plain films which showed no acute fracture. On recheck, she was freely ranging her right hip and was able to bear weight and walk on my exam. Therefore doubt occult hip fracture. Screening labs are also reassuring. Although this was not witnessed, doubt significant syncopal episode as she was seen walking immediately prior to the event. Screening EKG shows artifactand change from prior. Serial troponin are negative. Screening UA did show evidence of UTI for which we gave Keflex.  I called Desmond Dike, patient's daughter, and discussed ED findings. I also gave report to RN at patient's facility. Social worker engaged to help arrange transportation home.  Final Clinical Impressions(s) / ED Diagnoses   Final diagnoses:  Fall, initial encounter  Pain of right hip joint  Urinary tract infection with hematuria, site unspecified    ED Discharge Orders        Ordered    cephALEXin (KEFLEX) 500 MG capsule  2 times daily     03/16/18 2241       Cecille Po, MD 03/16/18 2324    Tegeler, Canary Brim, MD 03/17/18 1148

## 2018-03-17 ENCOUNTER — Telehealth (HOSPITAL_BASED_OUTPATIENT_CLINIC_OR_DEPARTMENT_OTHER): Payer: Self-pay | Admitting: *Deleted

## 2018-04-07 ENCOUNTER — Encounter (HOSPITAL_COMMUNITY): Payer: Self-pay

## 2018-04-07 ENCOUNTER — Emergency Department (HOSPITAL_COMMUNITY): Payer: Medicare HMO

## 2018-04-07 ENCOUNTER — Emergency Department (HOSPITAL_COMMUNITY)
Admission: EM | Admit: 2018-04-07 | Discharge: 2018-04-07 | Disposition: A | Discharge status: Home / Self Care | Payer: Medicare HMO | Attending: Emergency Medicine | Admitting: Emergency Medicine

## 2018-04-07 DIAGNOSIS — J80 Acute respiratory distress syndrome: Secondary | ICD-10-CM | POA: Diagnosis not present

## 2018-04-07 DIAGNOSIS — Z79899 Other long term (current) drug therapy: Secondary | ICD-10-CM | POA: Insufficient documentation

## 2018-04-07 DIAGNOSIS — J9811 Atelectasis: Secondary | ICD-10-CM | POA: Diagnosis not present

## 2018-04-07 DIAGNOSIS — R52 Pain, unspecified: Secondary | ICD-10-CM

## 2018-04-07 DIAGNOSIS — R0902 Hypoxemia: Secondary | ICD-10-CM | POA: Diagnosis not present

## 2018-04-07 DIAGNOSIS — S7001XA Contusion of right hip, initial encounter: Secondary | ICD-10-CM | POA: Diagnosis not present

## 2018-04-07 DIAGNOSIS — F0390 Unspecified dementia without behavioral disturbance: Secondary | ICD-10-CM | POA: Insufficient documentation

## 2018-04-07 DIAGNOSIS — R279 Unspecified lack of coordination: Secondary | ICD-10-CM | POA: Diagnosis not present

## 2018-04-07 DIAGNOSIS — W010XXA Fall on same level from slipping, tripping and stumbling without subsequent striking against object, initial encounter: Secondary | ICD-10-CM | POA: Diagnosis not present

## 2018-04-07 DIAGNOSIS — I2510 Atherosclerotic heart disease of native coronary artery without angina pectoris: Secondary | ICD-10-CM | POA: Diagnosis not present

## 2018-04-07 DIAGNOSIS — I10 Essential (primary) hypertension: Secondary | ICD-10-CM | POA: Diagnosis not present

## 2018-04-07 DIAGNOSIS — W19XXXA Unspecified fall, initial encounter: Secondary | ICD-10-CM | POA: Diagnosis not present

## 2018-04-07 DIAGNOSIS — R69 Illness, unspecified: Secondary | ICD-10-CM | POA: Diagnosis not present

## 2018-04-07 DIAGNOSIS — R410 Disorientation, unspecified: Secondary | ICD-10-CM | POA: Diagnosis not present

## 2018-04-07 DIAGNOSIS — R4182 Altered mental status, unspecified: Secondary | ICD-10-CM | POA: Diagnosis not present

## 2018-04-07 DIAGNOSIS — R404 Transient alteration of awareness: Secondary | ICD-10-CM | POA: Diagnosis not present

## 2018-04-07 DIAGNOSIS — Z7982 Long term (current) use of aspirin: Secondary | ICD-10-CM | POA: Insufficient documentation

## 2018-04-07 DIAGNOSIS — M25551 Pain in right hip: Secondary | ICD-10-CM | POA: Insufficient documentation

## 2018-04-07 DIAGNOSIS — I4891 Unspecified atrial fibrillation: Secondary | ICD-10-CM | POA: Diagnosis not present

## 2018-04-07 DIAGNOSIS — G459 Transient cerebral ischemic attack, unspecified: Secondary | ICD-10-CM | POA: Diagnosis not present

## 2018-04-07 DIAGNOSIS — Z743 Need for continuous supervision: Secondary | ICD-10-CM | POA: Diagnosis not present

## 2018-04-07 LAB — CBC
HCT: 37.3 % (ref 36.0–46.0)
HEMOGLOBIN: 10.9 g/dL — AB (ref 12.0–15.0)
MCH: 25.8 pg — ABNORMAL LOW (ref 26.0–34.0)
MCHC: 29.2 g/dL — ABNORMAL LOW (ref 30.0–36.0)
MCV: 88.4 fL (ref 78.0–100.0)
PLATELETS: 152 10*3/uL (ref 150–400)
RBC: 4.22 MIL/uL (ref 3.87–5.11)
RDW: 18.8 % — ABNORMAL HIGH (ref 11.5–15.5)
WBC: 9.6 10*3/uL (ref 4.0–10.5)

## 2018-04-07 LAB — BASIC METABOLIC PANEL
Anion gap: 13 (ref 5–15)
BUN: 15 mg/dL (ref 8–23)
CALCIUM: 8.4 mg/dL — AB (ref 8.9–10.3)
CO2: 23 mmol/L (ref 22–32)
Chloride: 104 mmol/L (ref 98–111)
Creatinine, Ser: 0.99 mg/dL (ref 0.44–1.00)
GFR calc Af Amer: 58 mL/min — ABNORMAL LOW (ref >60)
GFR calc non Af Amer: 50 mL/min — ABNORMAL LOW (ref >60)
Glucose, Bld: 112 mg/dL — ABNORMAL HIGH (ref 70–99)
Potassium: 3.8 mmol/L (ref 3.5–5.1)
Sodium: 140 mmol/L (ref 135–145)

## 2018-04-07 NOTE — ED Notes (Signed)
ED Provider at bedside. 

## 2018-04-07 NOTE — ED Triage Notes (Signed)
Pt arrived via [facility]; pt from [employer] care for repeated falls; pt has bruising to R side of back; Pt's sating in 80s; placed on 3L via [place]; CBG 118, 172/76, 20, 50-100s (hx of Afib); pt DNR

## 2018-04-07 NOTE — ED Notes (Signed)
Receiving facility ([facility]) verbalizes understanding of discharge instructions. Opportunity for questioning and answers were provided. Armband removed by staff, pt discharged from ED

## 2018-04-07 NOTE — Discharge Instructions (Signed)
It was our pleasure to provide your ER care today - we hope that you feel better.  Fall precautions. Ambulate with assistance.   Follow up with primary care doctor in the coming week.  Return to ER if worse, new symptoms, fevers, new or severe pain, increased trouble breathing, other concern.

## 2018-04-07 NOTE — ED Provider Notes (Signed)
[facility] EMERGENCY DEPARTMENT Provider Note   CSN: [id number] Arrival date & time: 04/07/18  0957     History   Chief Complaint Chief Complaint  Patient presents with  . Fall    HPI Connie Wagner is a 84 y.o. female.  Patient with hx dementia w reported fall at [facility]. Pt has history of same. Pt w advanced dementia - very limited historian - level 5 caveat. ?right hip pain w movement.  No reported loc. pts mental status reported as being c/w baseline. No vomiting. No report of fevers.   The history is provided by the patient, the EMS personnel and the nursing home. The history is limited by the condition of the patient.  Fall     Past Medical History:  Diagnosis Date  . Anemia    iron deficient  . Anxiety   . Atrial fibrillation (HCC)    per daughter  . CAD (coronary artery disease)    a. s/p Inf STEMI 2/14 => LHC 10/18/12: Diffuse distal LM 30%, LAD 50% calcified stenosis just after first septal perforator, diagonal branches with mild 20-30% stenosis, mid RCA 99%. PCI: Promus Premier DES to the mid RCA  . Carotid stenosis    a. [employer] screening done in 2009 with carotid stenosis noted;  b.  Carotid dopplers 3/14: R 0-39%, L 40-59%, repeat 1 year  . Chronic lower back pain   . 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 Dementia    needs redirection freq.  . Depression   . Hypertension   . Ischemic cardiomyopathy    a. Echocardiogram 10/18/12: Mild LVH, inferior and inferoseptal AK, posterior HK, EF 45%, grade 1 diastolic dysfunction, aortic sclerosis without stenosis, mild MR, mild LAE, RV systolic function moderately reduced (pacer wire in right ventricle), PASP 32  . Migraines    "haven't had one since going thru menopause"  . Rheumatoid arthritis(714.0)   . Upper GI bleeding 09/14/11   "have had it once before today"    Patient Active Problem List   Diagnosis Date  Noted  . Atrial fibrillation with RVR (HCC) 10/26/2017  . Wheezing   . Weakness   . Palliative care by specialist   . Goals of care, counseling/discussion   . Acute pneumonia 10/22/2017  . Shortness of breath 10/22/2017  . Carotid stenosis, bilateral 07/05/2017  . Cerebral embolism with cerebral infarction 07/04/2017  . Stroke (cerebrum) (HCC) 07/04/2017  . Physical deconditioning 10/23/2012  . Cardiogenic shock (HCC) 10/23/2012  . Wenckebach second degree AV block 10/20/2012  . Complete heart block (HCC) 10/19/2012  . Acute renal failure (HCC) 10/19/2012  . Acute lower UTI 10/19/2012  . Acute systolic CHF (congestive heart failure) (HCC) 10/19/2012  . Normocytic anemia 10/19/2012  . S/P drug eluting coronary stent placement 10/19/2012  . Coronary artery disease 10/19/2012  . Acute MI, inferior wall, initial episode of care (HCC) 10/18/2012  . Urinary tract infection, E. coli 08/21/2012  . Dementia   . Depression   . Prepyloric ulcer 09/15/2011  . Hypotension due to blood loss 09/15/2011  . Leukocytosis 09/15/2011  . Extrarenal azotemia 09/15/2011  . GI bleeding 09/14/2011  . Anemia associated with acute blood loss 09/14/2011  . Tachycardia 09/14/2011  . HTN (hypertension) 09/14/2011  . Anxiety 09/14/2011  . ETOH abuse 09/14/2011  . 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 09/14/2011    Past Surgical  History:  Procedure Laterality Date  . BACK SURGERY    . BREAST LUMPECTOMY     "benign tumor removed"  . ESOPHAGOGASTRODUODENOSCOPY  09/15/2011   Procedure: ESOPHAGOGASTRODUODENOSCOPY (EGD);  Surgeon: Barrie Folk, MD;  Location: [facility] ENDOSCOPY;  Service: Endoscopy;  Laterality: N/A;  . HARVEST BONE GRAFT  08/17/2012   Procedure: HARVEST ILIAC BONE GRAFT;  Surgeon: Eulas Post, MD;  Location: [facility] OR;  Service: Orthopedics;  Laterality: Right;  . HEMATOMA EVACUATION  10/2002   lumbar  . INCISION AND DRAINAGE  OF WOUND  10/2002   lumbar wound infection  . JOINT REPLACEMENT    . KNEE ARTHROSCOPY  01/2001   right  . LEFT HEART CATHETERIZATION WITH CORONARY ANGIOGRAM N/A 10/18/2012   Procedure: LEFT HEART CATHETERIZATION WITH CORONARY ANGIOGRAM;  Surgeon: Tonny Bollman, MD;  Location: [facility] CATH LAB;  Service: Cardiovascular;  Laterality: N/A;  . LUMBAR DISC SURGERY  09/2002   Hemilaminectomy, L3, complete laminectomy, L4, hemi- semilaminectomy, L5 and right S1, and diskectomy, L5-S1.  Marland Kitchen LUMBAR LAMINECTOMY  10/2003   for epidural abscess evacuation; L4-5  . ORIF HUMERUS FRACTURE  08/17/2012   Procedure: OPEN REDUCTION INTERNAL FIXATION (ORIF) DISTAL HUMERUS FRACTURE;  Surgeon: Eulas Post, MD;  Location: [facility] OR;  Service: Orthopedics;  Laterality: Right;  ORIF RIGHT SUPRACHONDULAR HUMERUS FRACTURE  . TOTAL KNEE ARTHROPLASTY  01/2002   right     OB History    Gravida  0   Para  0   Term  0   Preterm  0   AB  0   Living  0     SAB  0   TAB  0   Ectopic  0   Multiple  0   Live Births               Home Medications    Prior to Admission medications   Medication Sig Start Date End Date Taking? Authorizing Provider  acetaminophen (TYLENOL) 325 MG tablet Take 2 tablets (650 mg total) every 6 (six) hours as needed by mouth for mild pain or fever. 07/06/17   Beryl Meager, NP  aspirin EC 81 MG EC tablet Take 1 tablet (81 mg total) daily by mouth. 07/06/17   Beryl Meager, NP  benzonatate (TESSALON) 200 MG capsule Take 1 capsule (200 mg total) by mouth 3 (three) times daily as needed for cough. Patient not taking: Reported on 03/16/2018 10/28/17   Maretta Bees, MD  digoxin (LANOXIN) 0.125 MG tablet Take 1 tablet (125 mcg total) by mouth daily. 10/30/17 10/30/18  Leroy Sea, MD  divalproex (DEPAKOTE) 250 MG DR tablet Take 250 mg by mouth 2 (two) times daily.    [provider]  guaiFENesin (MUCINEX) 600 MG 12 hr tablet Take 1 tablet (600 mg total) by mouth 2 (two)  times daily. Patient not taking: Reported on 03/16/2018 10/28/17   Maretta Bees, MD  metoprolol tartrate (LOPRESSOR) 50 MG tablet Take 1.5 tablets (75 mg total) by mouth 2 (two) times daily. Patient taking differently: Take 50 mg by mouth 2 (two) times daily.  10/30/17 10/30/18  Leroy Sea, MD  pantoprazole (PROTONIX) 40 MG tablet Take 1 tablet (40 mg total) daily by mouth. 07/06/17   Beryl Meager, NP  polyethylene glycol (MIRALAX / GLYCOLAX) packet Take 17 g by mouth daily. Patient not taking: Reported on 03/16/2018 10/28/17   Maretta Bees, MD  Polyethylene Glycol 3350 (PEG 3350) POWD Take 17 g  by mouth See admin instructions. Mix 17 grams into 8 ounces of water and drink once a day    [provider]  pravastatin (PRAVACHOL) 40 MG tablet Take 1 tablet (40 mg total) daily at 6 PM by mouth. 07/06/17   Beryl Meager, NP  QUEtiapine (SEROQUEL) 25 MG tablet Take 1 tablet (25 mg total) 2 (two) times daily by mouth. 07/06/17   Beryl Meager, NP    Family History Family History  Problem Relation Age of Onset  . Heart attack Mother   . Stroke Mother   . Hypertension Sister   . Anesthesia problems Neg Hx   . Hypotension Neg Hx   . Malignant hyperthermia Neg Hx   . Pseudochol deficiency Neg Hx     Social History Social History   Tobacco Use  . Smoking status: Never Smoker  . Smokeless tobacco: Never Used  Substance Use Topics  . Alcohol use: Yes    Alcohol/week: 5.0 standard drinks    Types: 5 Glasses of wine per week  . Drug use: No     Allergies   Amoxicillin-pot clavulanate; Codeine; Escitalopram oxalate; Iron; Losartan potassium; Metronidazole; Raloxifene hcl; Tetracycline hcl; Penicillins; Raloxifene; and Sulfa drugs cross reactors   Review of Systems Review of Systems  Unable to perform ROS: Dementia  Constitutional: Negative for fever.  Gastrointestinal: Negative for vomiting.  level 5 caveat - pt unresponsive to questions.    Physical  Exam Updated Vital Signs BP (!) 147/97 (BP Location: Right Arm)   Pulse 91   Temp 97.7 F (36.5 C) (Oral)   Resp (!) 22   SpO2 100%   Physical Exam  Constitutional: She appears well-developed and well-nourished.  HENT:  Head: Atraumatic.  No facial or scalp sts, tenderness or contusions.   Eyes: Pupils are equal, round, and reactive to light. Conjunctivae are normal. No scleral icterus.  Neck: Normal range of motion. Neck supple. No tracheal deviation present.  Cardiovascular: Normal rate, regular rhythm, normal heart sounds and intact distal pulses.  Pulmonary/Chest: Effort normal and breath sounds normal. No respiratory distress. She exhibits no tenderness.  Abdominal: Soft. Normal appearance and bowel sounds are normal. She exhibits no distension. There is no tenderness.  Genitourinary:  Genitourinary Comments: No cva tenderness.   Musculoskeletal: She exhibits no edema.  ?mild pain w rom right hip/inconsistent. Distal pulses palp bil. Otherwise good rom bil extremities without pain or focal bony tenderness.   Neurological: She is alert.  Awake and alert, content appearing. Mental status reported as c/w baseline. Moves bil extremities purposefully w good strength.   Skin: Skin is warm and dry. No rash noted.  Psychiatric: She has a normal mood and affect.  Nursing note and vitals reviewed.    ED Treatments / Results  Labs (all labs ordered are listed, but only abnormal results are displayed) Results for orders placed or performed during the hospital encounter of 04/07/18  Basic metabolic panel  Result Value Ref Range   Sodium 140 135 - 145 mmol/L   Potassium 3.8 3.5 - 5.1 mmol/L   Chloride 104 98 - 111 mmol/L   CO2 23 22 - 32 mmol/L   Glucose, Bld 112 (H) 70 - 99 mg/dL   BUN 15 8 - 23 mg/dL   Creatinine, Ser 2.63 0.44 - 1.00 mg/dL   Calcium 8.4 (L) 8.9 - 10.3 mg/dL   GFR calc non Af Amer 50 (L) >60 mL/min   GFR calc Af Amer 58 (L) >60 mL/min  Anion gap 13 5 - 15   CBC  Result Value Ref Range   WBC 9.6 4.0 - 10.5 K/uL   RBC 4.22 3.87 - 5.11 MIL/uL   Hemoglobin 10.9 (L) 12.0 - 15.0 g/dL   HCT 13.2 44.0 - 10.2 %   MCV 88.4 78.0 - 100.0 fL   MCH 25.8 (L) 26.0 - 34.0 pg   MCHC 29.2 (L) 30.0 - 36.0 g/dL   RDW 72.5 (H) 36.6 - 44.0 %   Platelets 152 150 - 400 K/uL   Dg Chest 1 View  Result Date: 04/07/2018 CLINICAL DATA:  Right back bruising following multiple falls. EXAM: CHEST  1 VIEW COMPARISON:  03/16/2018. FINDINGS: Stable enlarged cardiac silhouette. Poor inspiration with minimal linear atelectasis at the left lung base. Mildly prominent pulmonary vasculature. Mild scoliosis. No fracture or pneumothorax seen. Cervical spine degenerative changes. IMPRESSION: 1. Poor inspiration with minimal left basilar atelectasis. 2. Stable cardiomegaly with interval mild pulmonary vascular congestion. Electronically Signed   By: Beckie Salts M.D.   On: 04/07/2018 11:13   Dg Chest 2 View  Result Date: 03/16/2018 CLINICAL DATA:  Pain following fall EXAM: CHEST - 2 VIEW COMPARISON:  Dec 28, 2017 November 07, 2017 FINDINGS: No edema or consolidation. Heart mildly enlarged with pulmonary vascularity normal. No adenopathy. There is aortic atherosclerosis. There is anterior wedging of several thoracic vertebral bodies. IMPRESSION: No edema or consolidation. Stable cardiac prominence. There is aortic atherosclerosis. Aortic Atherosclerosis (ICD10-I70.0). Electronically Signed   By: Bretta Bang III M.D.   On: 03/16/2018 18:22   Dg Knee 2 Views Right  Result Date: 03/16/2018 CLINICAL DATA:  Pain following fall EXAM: RIGHT KNEE - 1-2 VIEW COMPARISON:  December 13, 2017 FINDINGS: Frontal and lateral views obtained. Patient is status post total knee replacement with prosthetic components well-seated. No acute fracture or dislocation. No joint effusion. No erosive change. IMPRESSION: Total knee replacement with prosthetic components well-seated. No fracture or dislocation. No joint  effusion. Electronically Signed   By: Bretta Bang III M.D.   On: 03/16/2018 18:21   Ct Head Wo Contrast  Result Date: 03/16/2018 CLINICAL DATA:  Patient found down EXAM: CT HEAD WITHOUT CONTRAST CT CERVICAL SPINE WITHOUT CONTRAST TECHNIQUE: Multidetector CT imaging of the head and cervical spine was performed following the standard protocol without intravenous contrast. Multiplanar CT image reconstructions of the cervical spine were also generated. COMPARISON:  None. FINDINGS: CT HEAD FINDINGS Brain: There is no mass, hemorrhage or extra-axial collection. There is generalized atrophy without lobar predilection. There is no acute or chronic infarction. There is hypoattenuation of the periventricular white matter, most commonly indicating chronic ischemic microangiopathy. Vascular: Atherosclerotic calcification of the internal carotid arteries at the skull base. No abnormal hyperdensity of the major intracranial arteries or dural venous sinuses. Skull: The visualized skull base, calvarium and extracranial soft tissues are normal. Sinuses/Orbits: No fluid levels or advanced mucosal thickening of the visualized paranasal sinuses. No mastoid or middle ear effusion. The orbits are normal. CT CERVICAL SPINE FINDINGS Alignment: Grade 1 anterolisthesis at C3-C4 secondary to facet hypertrophy. Skull base and vertebrae: No acute fracture. Soft tissues and spinal canal: No prevertebral fluid or swelling. No visible canal hematoma. Disc levels:Disc space narrowing is greatest at C5-6 and C6-7 with associated uncovertebral hypertrophy. Facet disease is greatest at the right C3-C5 levels. No advanced spinal canal or neural foraminal stenosis. Upper chest: No pneumothorax, pulmonary nodule or pleural effusion. Other: Normal visualized paraspinal cervical soft tissues. IMPRESSION: 1. No acute intracranial or  cervical spine abnormality. 2. Generalized brain atrophy and sequelae of chronic ischemic microangiopathy. 3.  Multilevel degenerative disc disease and facet arthrosis. Electronically Signed   By: Deatra Robinson M.D.   On: 03/16/2018 19:22   Ct Cervical Spine Wo Contrast  Result Date: 03/16/2018 CLINICAL DATA:  Patient found down EXAM: CT HEAD WITHOUT CONTRAST CT CERVICAL SPINE WITHOUT CONTRAST TECHNIQUE: Multidetector CT imaging of the head and cervical spine was performed following the standard protocol without intravenous contrast. Multiplanar CT image reconstructions of the cervical spine were also generated. COMPARISON:  None. FINDINGS: CT HEAD FINDINGS Brain: There is no mass, hemorrhage or extra-axial collection. There is generalized atrophy without lobar predilection. There is no acute or chronic infarction. There is hypoattenuation of the periventricular white matter, most commonly indicating chronic ischemic microangiopathy. Vascular: Atherosclerotic calcification of the internal carotid arteries at the skull base. No abnormal hyperdensity of the major intracranial arteries or dural venous sinuses. Skull: The visualized skull base, calvarium and extracranial soft tissues are normal. Sinuses/Orbits: No fluid levels or advanced mucosal thickening of the visualized paranasal sinuses. No mastoid or middle ear effusion. The orbits are normal. CT CERVICAL SPINE FINDINGS Alignment: Grade 1 anterolisthesis at C3-C4 secondary to facet hypertrophy. Skull base and vertebrae: No acute fracture. Soft tissues and spinal canal: No prevertebral fluid or swelling. No visible canal hematoma. Disc levels:Disc space narrowing is greatest at C5-6 and C6-7 with associated uncovertebral hypertrophy. Facet disease is greatest at the right C3-C5 levels. No advanced spinal canal or neural foraminal stenosis. Upper chest: No pneumothorax, pulmonary nodule or pleural effusion. Other: Normal visualized paraspinal cervical soft tissues. IMPRESSION: 1. No acute intracranial or cervical spine abnormality. 2. Generalized brain atrophy and  sequelae of chronic ischemic microangiopathy. 3. Multilevel degenerative disc disease and facet arthrosis. Electronically Signed   By: Deatra Robinson M.D.   On: 03/16/2018 19:22   Dg Hip Unilat W Or W/o Pelvis 2-3 Views Right  Result Date: 04/07/2018 CLINICAL DATA:  Right back bruising. Multiple falls. EXAM: DG HIP (WITH OR WITHOUT PELVIS) 2-3V RIGHT COMPARISON:  03/16/2018. FINDINGS: Normal appearing right hip without fracture or dislocation. Lower lumbar spine degenerative changes. Atheromatous arterial calcifications. Rectal catheter. IMPRESSION: No fracture or dislocation. Electronically Signed   By: Beckie Salts M.D.   On: 04/07/2018 11:15   Dg Hip Unilat  With Pelvis 2-3 Views Right  Result Date: 03/16/2018 CLINICAL DATA:  Pain following fall EXAM: DG HIP (WITH OR WITHOUT PELVIS) 2-3V RIGHT COMPARISON:  None. FINDINGS: Frontal pelvis as well as frontal and lateral right hip images were obtained. There is no appreciable acute fracture or dislocation. Joint spaces appear unremarkable. There is degenerative change in the lower lumbar spine. No erosive change. There are foci of vascular calcification in the pelvis. IMPRESSION: Degenerative change in lower lumbar spine. No acute fracture or dislocation. No appreciable joint space narrowing or erosion. Foci of arterial vascular calcification noted. Electronically Signed   By: Bretta Bang III M.D.   On: 03/16/2018 18:19   Dg Femur Min 2 Views Right  Result Date: 03/16/2018 CLINICAL DATA:  Pain following fall EXAM: RIGHT FEMUR 2 VIEWS COMPARISON:  None. FINDINGS: Frontal and lateral views were obtained. There is no demonstrable fracture or dislocation. There is a total knee replacement with prosthetic components well-seated. No appreciable hip joint arthropathy. There is superficial femoral artery atherosclerotic calcification. IMPRESSION: Fracture or dislocation. Total knee replacement with prosthetic components well-seated. No appreciable  arthropathy. There is superficial femoral artery vascular atherosclerotic calcification. Electronically  Signed   By: Bretta Bang III M.D.   On: 03/16/2018 18:20    EKG None  Radiology Dg Chest 1 View  Result Date: 04/07/2018 CLINICAL DATA:  Right back bruising following multiple falls. EXAM: CHEST  1 VIEW COMPARISON:  03/16/2018. FINDINGS: Stable enlarged cardiac silhouette. Poor inspiration with minimal linear atelectasis at the left lung base. Mildly prominent pulmonary vasculature. Mild scoliosis. No fracture or pneumothorax seen. Cervical spine degenerative changes. IMPRESSION: 1. Poor inspiration with minimal left basilar atelectasis. 2. Stable cardiomegaly with interval mild pulmonary vascular congestion. Electronically Signed   By: Beckie Salts M.D.   On: 04/07/2018 11:13   Dg Hip Unilat W Or W/o Pelvis 2-3 Views Right  Result Date: 04/07/2018 CLINICAL DATA:  Right back bruising. Multiple falls. EXAM: DG HIP (WITH OR WITHOUT PELVIS) 2-3V RIGHT COMPARISON:  03/16/2018. FINDINGS: Normal appearing right hip without fracture or dislocation. Lower lumbar spine degenerative changes. Atheromatous arterial calcifications. Rectal catheter. IMPRESSION: No fracture or dislocation. Electronically Signed   By: Beckie Salts M.D.   On: 04/07/2018 11:15    Procedures Procedures (including critical care time)  Medications Ordered in ED Medications - No data to display   Initial Impression / Assessment and Plan / ED Course  I have reviewed the triage vital signs and the nursing notes.  Pertinent labs & imaging results that were available during my care of the patient were reviewed by me and considered in my medical decision making (see chart for details).  Labs. Imaging.  Reviewed nursing notes and prior charts for additional history.   Recheck, room air pulse ox is 98%. HR 88. RR 16. No increased wob. Pt appears comfortable, and in no acute distress.   xrays reviewed-  No fx.   Labs  reviewed - c/w baseline, chem normal.   Reviewed papers from [facility] - hx advanced dementia at baseline, has dnr papers on chart.   Pt currently appears comfortable, in no acute distress, and stable for d/c.     Final Clinical Impressions(s) / ED Diagnoses   Final diagnoses:  None    ED Discharge Orders    None       Cathren Laine, MD 04/07/18 1125

## 2018-04-07 NOTE — ED Notes (Signed)
Patient transported to X-ray 

## 2018-04-07 NOTE — ED Notes (Signed)
Pt placed on a purwick

## 2018-04-25 DIAGNOSIS — R2689 Other abnormalities of gait and mobility: Secondary | ICD-10-CM | POA: Diagnosis not present

## 2018-04-25 DIAGNOSIS — R2681 Unsteadiness on feet: Secondary | ICD-10-CM | POA: Diagnosis not present

## 2018-04-25 DIAGNOSIS — M6281 Muscle weakness (generalized): Secondary | ICD-10-CM | POA: Diagnosis not present

## 2018-04-25 DIAGNOSIS — R296 Repeated falls: Secondary | ICD-10-CM | POA: Diagnosis not present

## 2018-04-26 DIAGNOSIS — R41841 Cognitive communication deficit: Secondary | ICD-10-CM | POA: Diagnosis not present

## 2018-04-27 DIAGNOSIS — R296 Repeated falls: Secondary | ICD-10-CM | POA: Diagnosis not present

## 2018-04-27 DIAGNOSIS — R2681 Unsteadiness on feet: Secondary | ICD-10-CM | POA: Diagnosis not present

## 2018-04-27 DIAGNOSIS — R2689 Other abnormalities of gait and mobility: Secondary | ICD-10-CM | POA: Diagnosis not present

## 2018-04-27 DIAGNOSIS — M6281 Muscle weakness (generalized): Secondary | ICD-10-CM | POA: Diagnosis not present

## 2018-05-01 DIAGNOSIS — R296 Repeated falls: Secondary | ICD-10-CM | POA: Diagnosis not present

## 2018-05-01 DIAGNOSIS — M6281 Muscle weakness (generalized): Secondary | ICD-10-CM | POA: Diagnosis not present

## 2018-05-01 DIAGNOSIS — R2689 Other abnormalities of gait and mobility: Secondary | ICD-10-CM | POA: Diagnosis not present

## 2018-05-01 DIAGNOSIS — R2681 Unsteadiness on feet: Secondary | ICD-10-CM | POA: Diagnosis not present

## 2018-05-02 DIAGNOSIS — R41841 Cognitive communication deficit: Secondary | ICD-10-CM | POA: Diagnosis not present

## 2018-05-03 DIAGNOSIS — M6281 Muscle weakness (generalized): Secondary | ICD-10-CM | POA: Diagnosis not present

## 2018-05-03 DIAGNOSIS — R2689 Other abnormalities of gait and mobility: Secondary | ICD-10-CM | POA: Diagnosis not present

## 2018-05-03 DIAGNOSIS — R296 Repeated falls: Secondary | ICD-10-CM | POA: Diagnosis not present

## 2018-05-03 DIAGNOSIS — R2681 Unsteadiness on feet: Secondary | ICD-10-CM | POA: Diagnosis not present

## 2018-05-04 DIAGNOSIS — M6281 Muscle weakness (generalized): Secondary | ICD-10-CM | POA: Diagnosis not present

## 2018-05-04 DIAGNOSIS — R296 Repeated falls: Secondary | ICD-10-CM | POA: Diagnosis not present

## 2018-05-04 DIAGNOSIS — R2681 Unsteadiness on feet: Secondary | ICD-10-CM | POA: Diagnosis not present

## 2018-05-04 DIAGNOSIS — R2689 Other abnormalities of gait and mobility: Secondary | ICD-10-CM | POA: Diagnosis not present

## 2018-05-07 DIAGNOSIS — R296 Repeated falls: Secondary | ICD-10-CM | POA: Diagnosis not present

## 2018-05-07 DIAGNOSIS — M6281 Muscle weakness (generalized): Secondary | ICD-10-CM | POA: Diagnosis not present

## 2018-05-07 DIAGNOSIS — R2681 Unsteadiness on feet: Secondary | ICD-10-CM | POA: Diagnosis not present

## 2018-05-07 DIAGNOSIS — R2689 Other abnormalities of gait and mobility: Secondary | ICD-10-CM | POA: Diagnosis not present

## 2018-05-08 DIAGNOSIS — R41841 Cognitive communication deficit: Secondary | ICD-10-CM | POA: Diagnosis not present

## 2018-05-09 DIAGNOSIS — R2681 Unsteadiness on feet: Secondary | ICD-10-CM | POA: Diagnosis not present

## 2018-05-09 DIAGNOSIS — R296 Repeated falls: Secondary | ICD-10-CM | POA: Diagnosis not present

## 2018-05-09 DIAGNOSIS — R2689 Other abnormalities of gait and mobility: Secondary | ICD-10-CM | POA: Diagnosis not present

## 2018-05-09 DIAGNOSIS — M6281 Muscle weakness (generalized): Secondary | ICD-10-CM | POA: Diagnosis not present

## 2018-05-10 DIAGNOSIS — R41841 Cognitive communication deficit: Secondary | ICD-10-CM | POA: Diagnosis not present

## 2018-05-14 DIAGNOSIS — M6281 Muscle weakness (generalized): Secondary | ICD-10-CM | POA: Diagnosis not present

## 2018-05-14 DIAGNOSIS — R2689 Other abnormalities of gait and mobility: Secondary | ICD-10-CM | POA: Diagnosis not present

## 2018-05-14 DIAGNOSIS — R296 Repeated falls: Secondary | ICD-10-CM | POA: Diagnosis not present

## 2018-05-14 DIAGNOSIS — R2681 Unsteadiness on feet: Secondary | ICD-10-CM | POA: Diagnosis not present

## 2018-05-15 DIAGNOSIS — R41841 Cognitive communication deficit: Secondary | ICD-10-CM | POA: Diagnosis not present

## 2018-05-16 DIAGNOSIS — R2681 Unsteadiness on feet: Secondary | ICD-10-CM | POA: Diagnosis not present

## 2018-05-16 DIAGNOSIS — E46 Unspecified protein-calorie malnutrition: Secondary | ICD-10-CM | POA: Diagnosis not present

## 2018-05-16 DIAGNOSIS — N183 Chronic kidney disease, stage 3 (moderate): Secondary | ICD-10-CM | POA: Diagnosis not present

## 2018-05-16 DIAGNOSIS — Z79899 Other long term (current) drug therapy: Secondary | ICD-10-CM | POA: Diagnosis not present

## 2018-05-16 DIAGNOSIS — R2689 Other abnormalities of gait and mobility: Secondary | ICD-10-CM | POA: Diagnosis not present

## 2018-05-16 DIAGNOSIS — G301 Alzheimer's disease with late onset: Secondary | ICD-10-CM | POA: Diagnosis not present

## 2018-05-16 DIAGNOSIS — I481 Persistent atrial fibrillation: Secondary | ICD-10-CM | POA: Diagnosis not present

## 2018-05-16 DIAGNOSIS — R69 Illness, unspecified: Secondary | ICD-10-CM | POA: Diagnosis not present

## 2018-05-16 DIAGNOSIS — D649 Anemia, unspecified: Secondary | ICD-10-CM | POA: Diagnosis not present

## 2018-05-16 DIAGNOSIS — R269 Unspecified abnormalities of gait and mobility: Secondary | ICD-10-CM | POA: Diagnosis not present

## 2018-05-16 DIAGNOSIS — E7800 Pure hypercholesterolemia, unspecified: Secondary | ICD-10-CM | POA: Diagnosis not present

## 2018-05-16 DIAGNOSIS — I129 Hypertensive chronic kidney disease with stage 1 through stage 4 chronic kidney disease, or unspecified chronic kidney disease: Secondary | ICD-10-CM | POA: Diagnosis not present

## 2018-05-16 DIAGNOSIS — R296 Repeated falls: Secondary | ICD-10-CM | POA: Diagnosis not present

## 2018-05-16 DIAGNOSIS — M6281 Muscle weakness (generalized): Secondary | ICD-10-CM | POA: Diagnosis not present

## 2018-05-17 DIAGNOSIS — R41841 Cognitive communication deficit: Secondary | ICD-10-CM | POA: Diagnosis not present

## 2018-05-18 ENCOUNTER — Ambulatory Visit: Payer: Medicare HMO | Admitting: Podiatry

## 2018-05-18 DIAGNOSIS — R296 Repeated falls: Secondary | ICD-10-CM | POA: Diagnosis not present

## 2018-05-18 DIAGNOSIS — R2689 Other abnormalities of gait and mobility: Secondary | ICD-10-CM | POA: Diagnosis not present

## 2018-05-18 DIAGNOSIS — R2681 Unsteadiness on feet: Secondary | ICD-10-CM | POA: Diagnosis not present

## 2018-05-18 DIAGNOSIS — M6281 Muscle weakness (generalized): Secondary | ICD-10-CM | POA: Diagnosis not present

## 2018-05-21 DIAGNOSIS — R269 Unspecified abnormalities of gait and mobility: Secondary | ICD-10-CM | POA: Diagnosis not present

## 2018-05-21 DIAGNOSIS — M6281 Muscle weakness (generalized): Secondary | ICD-10-CM | POA: Diagnosis not present

## 2018-05-22 DIAGNOSIS — R41841 Cognitive communication deficit: Secondary | ICD-10-CM | POA: Diagnosis not present

## 2018-05-23 DIAGNOSIS — R2689 Other abnormalities of gait and mobility: Secondary | ICD-10-CM | POA: Diagnosis not present

## 2018-05-23 DIAGNOSIS — R2681 Unsteadiness on feet: Secondary | ICD-10-CM | POA: Diagnosis not present

## 2018-05-23 DIAGNOSIS — R296 Repeated falls: Secondary | ICD-10-CM | POA: Diagnosis not present

## 2018-05-23 DIAGNOSIS — M6281 Muscle weakness (generalized): Secondary | ICD-10-CM | POA: Diagnosis not present

## 2018-05-24 DIAGNOSIS — R41841 Cognitive communication deficit: Secondary | ICD-10-CM | POA: Diagnosis not present

## 2018-05-25 DIAGNOSIS — R296 Repeated falls: Secondary | ICD-10-CM | POA: Diagnosis not present

## 2018-05-25 DIAGNOSIS — R2689 Other abnormalities of gait and mobility: Secondary | ICD-10-CM | POA: Diagnosis not present

## 2018-05-25 DIAGNOSIS — M6281 Muscle weakness (generalized): Secondary | ICD-10-CM | POA: Diagnosis not present

## 2018-05-25 DIAGNOSIS — R2681 Unsteadiness on feet: Secondary | ICD-10-CM | POA: Diagnosis not present

## 2018-05-29 DIAGNOSIS — R41841 Cognitive communication deficit: Secondary | ICD-10-CM | POA: Diagnosis not present

## 2018-05-30 DIAGNOSIS — R296 Repeated falls: Secondary | ICD-10-CM | POA: Diagnosis not present

## 2018-05-30 DIAGNOSIS — R2689 Other abnormalities of gait and mobility: Secondary | ICD-10-CM | POA: Diagnosis not present

## 2018-05-30 DIAGNOSIS — R2681 Unsteadiness on feet: Secondary | ICD-10-CM | POA: Diagnosis not present

## 2018-05-30 DIAGNOSIS — M6281 Muscle weakness (generalized): Secondary | ICD-10-CM | POA: Diagnosis not present

## 2018-05-31 DIAGNOSIS — R41841 Cognitive communication deficit: Secondary | ICD-10-CM | POA: Diagnosis not present

## 2018-06-01 DIAGNOSIS — R296 Repeated falls: Secondary | ICD-10-CM | POA: Diagnosis not present

## 2018-06-01 DIAGNOSIS — R2681 Unsteadiness on feet: Secondary | ICD-10-CM | POA: Diagnosis not present

## 2018-06-01 DIAGNOSIS — R2689 Other abnormalities of gait and mobility: Secondary | ICD-10-CM | POA: Diagnosis not present

## 2018-06-01 DIAGNOSIS — M6281 Muscle weakness (generalized): Secondary | ICD-10-CM | POA: Diagnosis not present

## 2018-06-04 DIAGNOSIS — M6281 Muscle weakness (generalized): Secondary | ICD-10-CM | POA: Diagnosis not present

## 2018-06-04 DIAGNOSIS — R2689 Other abnormalities of gait and mobility: Secondary | ICD-10-CM | POA: Diagnosis not present

## 2018-06-04 DIAGNOSIS — R2681 Unsteadiness on feet: Secondary | ICD-10-CM | POA: Diagnosis not present

## 2018-06-04 DIAGNOSIS — R296 Repeated falls: Secondary | ICD-10-CM | POA: Diagnosis not present

## 2018-06-05 DIAGNOSIS — R41841 Cognitive communication deficit: Secondary | ICD-10-CM | POA: Diagnosis not present

## 2018-06-06 DIAGNOSIS — M6281 Muscle weakness (generalized): Secondary | ICD-10-CM | POA: Diagnosis not present

## 2018-06-06 DIAGNOSIS — R2681 Unsteadiness on feet: Secondary | ICD-10-CM | POA: Diagnosis not present

## 2018-06-06 DIAGNOSIS — R296 Repeated falls: Secondary | ICD-10-CM | POA: Diagnosis not present

## 2018-06-06 DIAGNOSIS — R2689 Other abnormalities of gait and mobility: Secondary | ICD-10-CM | POA: Diagnosis not present

## 2018-06-07 DIAGNOSIS — R41841 Cognitive communication deficit: Secondary | ICD-10-CM | POA: Diagnosis not present

## 2018-06-10 ENCOUNTER — Emergency Department (HOSPITAL_COMMUNITY): Payer: Medicare HMO

## 2018-06-10 ENCOUNTER — Emergency Department (HOSPITAL_COMMUNITY)
Admission: EM | Admit: 2018-06-10 | Discharge: 2018-06-10 | Disposition: A | Discharge status: Home / Self Care | Payer: Medicare HMO | Attending: Emergency Medicine | Admitting: Emergency Medicine

## 2018-06-10 DIAGNOSIS — Z96651 Presence of right artificial knee joint: Secondary | ICD-10-CM | POA: Diagnosis not present

## 2018-06-10 DIAGNOSIS — R404 Transient alteration of awareness: Secondary | ICD-10-CM | POA: Diagnosis not present

## 2018-06-10 DIAGNOSIS — W19XXXA Unspecified fall, initial encounter: Secondary | ICD-10-CM | POA: Insufficient documentation

## 2018-06-10 DIAGNOSIS — Y9389 Activity, other specified: Secondary | ICD-10-CM | POA: Insufficient documentation

## 2018-06-10 DIAGNOSIS — Z79899 Other long term (current) drug therapy: Secondary | ICD-10-CM | POA: Diagnosis not present

## 2018-06-10 DIAGNOSIS — Y92129 Unspecified place in nursing home as the place of occurrence of the external cause: Secondary | ICD-10-CM | POA: Insufficient documentation

## 2018-06-10 DIAGNOSIS — F0390 Unspecified dementia without behavioral disturbance: Secondary | ICD-10-CM | POA: Diagnosis not present

## 2018-06-10 DIAGNOSIS — I5020 Unspecified systolic (congestive) heart failure: Secondary | ICD-10-CM | POA: Diagnosis not present

## 2018-06-10 DIAGNOSIS — S0990XA Unspecified injury of head, initial encounter: Secondary | ICD-10-CM | POA: Diagnosis not present

## 2018-06-10 DIAGNOSIS — I110 Hypertensive heart disease with heart failure: Secondary | ICD-10-CM | POA: Diagnosis not present

## 2018-06-10 DIAGNOSIS — Y999 Unspecified external cause status: Secondary | ICD-10-CM | POA: Diagnosis not present

## 2018-06-10 DIAGNOSIS — R69 Illness, unspecified: Secondary | ICD-10-CM | POA: Diagnosis not present

## 2018-06-10 DIAGNOSIS — S3993XA Unspecified injury of pelvis, initial encounter: Secondary | ICD-10-CM | POA: Diagnosis not present

## 2018-06-10 DIAGNOSIS — S199XXA Unspecified injury of neck, initial encounter: Secondary | ICD-10-CM | POA: Diagnosis not present

## 2018-06-10 DIAGNOSIS — I2510 Atherosclerotic heart disease of native coronary artery without angina pectoris: Secondary | ICD-10-CM | POA: Insufficient documentation

## 2018-06-10 DIAGNOSIS — S299XXA Unspecified injury of thorax, initial encounter: Secondary | ICD-10-CM | POA: Diagnosis not present

## 2018-06-10 DIAGNOSIS — Z043 Encounter for examination and observation following other accident: Secondary | ICD-10-CM | POA: Diagnosis present

## 2018-06-10 NOTE — ED Notes (Signed)
[facility] contacted for tx to [facility]

## 2018-06-10 NOTE — Discharge Instructions (Addendum)
All your imaging CT Head,CT neck, Chest Xray, Pelvis xray were negative.Please follow up with your primary care physician as needed.If you experience any chest pain, shortness of breath you may return to the ED

## 2018-06-10 NOTE — ED Provider Notes (Signed)
[facility] EMERGENCY DEPARTMENT Provider Note   CSN: [id number] Arrival date & time: 06/10/18  1455     History   Chief Complaint Chief Complaint  Patient presents with  . Fall    HPI Connie Wagner is a 84 y.o. female. HPI 45 /yo female with a PMH of Dementia, Afib, Anemia presents to the ED via [facility] from [place] at [facility] memory care unit.  Patient reportedly was attempting to sit in a chair and missed the seat causing her to fall in a seated position on the floor and continued to roll back and struck her head on the floor.  Patient has no bleeding, bruising behind her head.  Into EMS patient refused compliance with c-collar and had a towel placed instead.  Upon examination patient had removed this towel and was using it as a pillow.  Patient is unable to answer questions as she states she is "I am very confused".  She does have dementia and is at her baseline according to staff members.  Able to complete a thorough history due to patient's mental status.  Past Medical History:  Diagnosis Date  . Anemia    iron deficient  . Anxiety   . Atrial fibrillation (HCC)    per daughter  . CAD (coronary artery disease)    a. s/p Inf STEMI 2/14 => LHC 10/18/12: Diffuse distal LM 30%, LAD 50% calcified stenosis just after first septal perforator, diagonal branches with mild 20-30% stenosis, mid RCA 99%. PCI: Promus Premier DES to the mid RCA  . Carotid stenosis    a. [employer] screening done in 2009 with carotid stenosis noted;  b.  Carotid dopplers 3/14: R 0-39%, L 40-59%, repeat 1 year  . Chronic lower back pain   . 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 Dementia    needs redirection freq.  . Depression   . Hypertension   . Ischemic cardiomyopathy    a. Echocardiogram 10/18/12: Mild LVH, inferior and inferoseptal AK, posterior HK, EF 45%, grade 1 diastolic dysfunction, aortic  sclerosis without stenosis, mild MR, mild LAE, RV systolic function moderately reduced (pacer wire in right ventricle), PASP 32  . Migraines    "haven't had one since going thru menopause"  . Rheumatoid arthritis(714.0)   . Upper GI bleeding 09/14/11   "have had it once before today"    Patient Active Problem List   Diagnosis Date Noted  . Atrial fibrillation with RVR (HCC) 10/26/2017  . Wheezing   . Weakness   . Palliative care by specialist   . Goals of care, counseling/discussion   . Acute pneumonia 10/22/2017  . Shortness of breath 10/22/2017  . Carotid stenosis, bilateral 07/05/2017  . Cerebral embolism with cerebral infarction 07/04/2017  . Stroke (cerebrum) (HCC) 07/04/2017  . Physical deconditioning 10/23/2012  . Cardiogenic shock (HCC) 10/23/2012  . Wenckebach second degree AV block 10/20/2012  . Complete heart block (HCC) 10/19/2012  . Acute renal failure (HCC) 10/19/2012  . Acute lower UTI 10/19/2012  . Acute systolic CHF (congestive heart failure) (HCC) 10/19/2012  . Normocytic anemia 10/19/2012  . S/P drug eluting coronary stent placement 10/19/2012  . Coronary artery disease 10/19/2012  . Acute MI, inferior wall, initial episode of care (HCC) 10/18/2012  . Urinary tract infection, E. coli 08/21/2012  . Dementia (HCC)   . Depression   . Prepyloric ulcer 09/15/2011  . Hypotension due to blood loss 09/15/2011  .  Leukocytosis 09/15/2011  . Extrarenal azotemia 09/15/2011  . GI bleeding 09/14/2011  . Anemia associated with acute blood loss 09/14/2011  . Tachycardia 09/14/2011  . HTN (hypertension) 09/14/2011  . Anxiety 09/14/2011  . ETOH abuse 09/14/2011  . 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 09/14/2011    Past Surgical History:  Procedure Laterality Date  . BACK SURGERY    . BREAST LUMPECTOMY     "benign tumor removed"  . ESOPHAGOGASTRODUODENOSCOPY  09/15/2011   Procedure:  ESOPHAGOGASTRODUODENOSCOPY (EGD);  Surgeon: Barrie Folk, MD;  Location: [facility] ENDOSCOPY;  Service: Endoscopy;  Laterality: N/A;  . HARVEST BONE GRAFT  08/17/2012   Procedure: HARVEST ILIAC BONE GRAFT;  Surgeon: Eulas Post, MD;  Location: [facility] OR;  Service: Orthopedics;  Laterality: Right;  . HEMATOMA EVACUATION  10/2002   lumbar  . INCISION AND DRAINAGE OF WOUND  10/2002   lumbar wound infection  . JOINT REPLACEMENT    . KNEE ARTHROSCOPY  01/2001   right  . LEFT HEART CATHETERIZATION WITH CORONARY ANGIOGRAM N/A 10/18/2012   Procedure: LEFT HEART CATHETERIZATION WITH CORONARY ANGIOGRAM;  Surgeon: Tonny Bollman, MD;  Location: [facility] CATH LAB;  Service: Cardiovascular;  Laterality: N/A;  . LUMBAR DISC SURGERY  09/2002   Hemilaminectomy, L3, complete laminectomy, L4, hemi- semilaminectomy, L5 and right S1, and diskectomy, L5-S1.  Marland Kitchen LUMBAR LAMINECTOMY  10/2003   for epidural abscess evacuation; L4-5  . ORIF HUMERUS FRACTURE  08/17/2012   Procedure: OPEN REDUCTION INTERNAL FIXATION (ORIF) DISTAL HUMERUS FRACTURE;  Surgeon: Eulas Post, MD;  Location: [facility] OR;  Service: Orthopedics;  Laterality: Right;  ORIF RIGHT SUPRACHONDULAR HUMERUS FRACTURE  . TOTAL KNEE ARTHROPLASTY  01/2002   right     OB History    Gravida  0   Para  0   Term  0   Preterm  0   AB  0   Living  0     SAB  0   TAB  0   Ectopic  0   Multiple  0   Live Births               Home Medications    Prior to Admission medications   Medication Sig Start Date End Date Taking? Authorizing Provider  acetaminophen (TYLENOL) 325 MG tablet Take 2 tablets (650 mg total) every 6 (six) hours as needed by mouth for mild pain or fever. Patient taking differently: Take 650 mg by mouth.  07/06/17  Yes Costello, Lamar Blinks, NP  digoxin (LANOXIN) 0.125 MG tablet Take 1 tablet (125 mcg total) by mouth daily. Patient taking differently: Take 0.0625 mcg by mouth daily.  10/30/17 10/30/18 Yes Leroy Sea, MD  divalproex  (DEPAKOTE) 250 MG DR tablet Take 250 mg by mouth 2 (two) times daily.   Yes [provider]  ferrous sulfate 325 (65 FE) MG tablet Take 325 mg by mouth daily with breakfast.   Yes [provider]  metoprolol tartrate (LOPRESSOR) 50 MG tablet Take 1.5 tablets (75 mg total) by mouth 2 (two) times daily. Patient taking differently: Take 50 mg by mouth 2 (two) times daily.  10/30/17 10/30/18 Yes Leroy Sea, MD  pantoprazole (PROTONIX) 40 MG tablet Take 1 tablet (40 mg total) daily by mouth. 07/06/17  Yes Costello, Lamar Blinks, NP  Polyethylene Glycol 3350 (PEG 3350) POWD Take 17 g by mouth See admin instructions. Mix 17 grams into 8 ounces of water and drink  once a day   Yes [provider]  QUEtiapine (SEROQUEL) 25 MG tablet Take 1 tablet (25 mg total) 2 (two) times daily by mouth. 07/06/17  Yes Costello, Lamar Blinks, NP  aspirin EC 81 MG EC tablet Take 1 tablet (81 mg total) daily by mouth. Patient not taking: Reported on 06/10/2018 07/06/17   Beryl Meager, NP  benzonatate (TESSALON) 200 MG capsule Take 1 capsule (200 mg total) by mouth 3 (three) times daily as needed for cough. Patient not taking: Reported on 03/16/2018 10/28/17   Maretta Bees, MD  guaiFENesin (MUCINEX) 600 MG 12 hr tablet Take 1 tablet (600 mg total) by mouth 2 (two) times daily. Patient not taking: Reported on 03/16/2018 10/28/17   Maretta Bees, MD  polyethylene glycol [facility]) packet Take 17 g by mouth daily. Patient not taking: Reported on 06/10/2018 10/28/17   Maretta Bees, MD  pravastatin (PRAVACHOL) 40 MG tablet Take 1 tablet (40 mg total) daily at 6 PM by mouth. Patient not taking: Reported on 06/10/2018 07/06/17   Beryl Meager, NP    Family History Family History  Problem Relation Age of Onset  . Heart attack Mother   . Stroke Mother   . Hypertension Sister   . Anesthesia problems Neg Hx   . Hypotension Neg Hx   . Malignant hyperthermia Neg Hx   . Pseudochol deficiency  Neg Hx     Social History Social History   Tobacco Use  . Smoking status: Never Smoker  . Smokeless tobacco: Never Used  Substance Use Topics  . Alcohol use: Yes    Alcohol/week: 5.0 standard drinks    Types: 5 Glasses of wine per week  . Drug use: No     Allergies   Amoxicillin-pot clavulanate; Codeine; Escitalopram oxalate; Iron; Losartan potassium; Metronidazole; Raloxifene hcl; Tetracycline hcl; Penicillins; Raloxifene; and Sulfa drugs cross reactors   Review of Systems Review of Systems  Unable to perform ROS: Dementia     Physical Exam Updated Vital Signs BP 137/73   Pulse 81   Temp 97.6 F (36.4 C) (Oral)   Resp 12   Ht 5\' 6"  (1.676 m)   SpO2 100%   BMI 25.82 kg/m   Physical Exam  Constitutional: She appears well-developed and well-nourished. No distress.  HENT:  Head: Normocephalic and atraumatic.  Neck: Normal range of motion. Neck supple.  Cardiovascular: Normal heart sounds.  Pulmonary/Chest: Breath sounds normal. She has no wheezes.  Abdominal: Soft. There is no tenderness.  Neurological: She is alert. She is disoriented.  Dementia at baseline  Skin: Skin is warm and dry.  Nursing note and vitals reviewed.    ED Treatments / Results  Labs (all labs ordered are listed, but only abnormal results are displayed) Labs Reviewed - No data to display  EKG None  Radiology Dg Chest 2 View  Result Date: 06/10/2018 CLINICAL DATA:  Fall to floor, head injury. EXAM: CHEST - 2 VIEW COMPARISON:  Chest x-rays dated 04/07/2018 in 03/16/2018. FINDINGS: Study is hypoinspiratory with crowding of the perihilar and bibasilar bronchovascular markings. Coarse lung markings again noted bilaterally suggesting some degree of chronic interstitial lung disease, accentuated by the low lung volumes. No evidence of superimposed pneumonia or pulmonary edema. No pleural effusion or pneumothorax seen. Chronic appearing compression fracture deformities within the thoracic and  lumbar spine, stable. No acute appearing osseous abnormality. IMPRESSION: 1. Low lung volumes. No active cardiopulmonary disease. No evidence of pneumonia or pulmonary edema. 2.  Probable chronic interstitial lung disease. 3. Chronic compression fracture deformities within the thoracic and lumbar spine, stable. No acute appearing osseous abnormality. Electronically Signed   By: Bary Richard M.D.   On: 06/10/2018 17:28   Dg Pelvis 1-2 Views  Result Date: 06/10/2018 CLINICAL DATA:  Fall to floor.  History of dementia. EXAM: PELVIS - 1-2 VIEW COMPARISON:  Plain films of the pelvis dated 12/28/2017 and 05/18/2012. FINDINGS: There is no evidence of pelvic fracture or diastasis. No pelvic bone lesions are seen. Degenerative changes again noted within the lower lumbar spine, at least moderate in degree, not significantly changed compared to previous exams. IMPRESSION: No osseous fracture or dislocation seen. Electronically Signed   By: Bary Richard M.D.   On: 06/10/2018 17:30   Ct Head Wo Contrast  Result Date: 06/10/2018 CLINICAL DATA:  Head trauma. EXAM: CT HEAD WITHOUT CONTRAST CT CERVICAL SPINE WITHOUT CONTRAST TECHNIQUE: Multidetector CT imaging of the head and cervical spine was performed following the standard protocol without intravenous contrast. Multiplanar CT image reconstructions of the cervical spine were also generated. COMPARISON:  Head CT and cervical spine CT dated 03/16/2018. FINDINGS: CT HEAD FINDINGS Brain: Generalized age related parenchymal volume loss with commensurate dilatation of the ventricles and sulci. Ventricular dilatation is stable. Chronic small vessel ischemic changes again noted throughout the bilateral periventricular and subcortical white matter regions, stable. There is no mass, hemorrhage, edema or other evidence of acute parenchymal abnormality. No extra-axial hemorrhage. Vascular: Chronic calcified atherosclerotic changes of the large vessels at the skull base. No  unexpected hyperdense vessel. Skull: Normal. Negative for fracture or focal lesion. Sinuses/Orbits: No acute finding. Other: None. CT CERVICAL SPINE FINDINGS Alignment: Stable.  No evidence of acute vertebral body subluxation. Skull base and vertebrae: No fracture line or displaced fracture fragment seen. Soft tissues and spinal canal: No prevertebral fluid or swelling. No visible canal hematoma. Disc levels: Degenerative spondylitic changes within the lower cervical spine, moderate in degree with associated disc space narrowings and osseous spurring. No more than mild central canal stenosis appreciated at any level. Upper chest: No acute findings. Other: Bilateral carotid atherosclerosis. IMPRESSION: 1. No acute intracranial abnormality. No intracranial mass, hemorrhage or edema. No skull fracture. Atrophy and chronic ischemic changes in the white matter. 2. No fracture or acute subluxation within the cervical spine. Degenerative changes within the lower cervical spine, as detailed above. 3. Carotid atherosclerosis. Electronically Signed   By: Bary Richard M.D.   On: 06/10/2018 16:33   Ct Cervical Spine Wo Contrast  Result Date: 06/10/2018 CLINICAL DATA:  Head trauma. EXAM: CT HEAD WITHOUT CONTRAST CT CERVICAL SPINE WITHOUT CONTRAST TECHNIQUE: Multidetector CT imaging of the head and cervical spine was performed following the standard protocol without intravenous contrast. Multiplanar CT image reconstructions of the cervical spine were also generated. COMPARISON:  Head CT and cervical spine CT dated 03/16/2018. FINDINGS: CT HEAD FINDINGS Brain: Generalized age related parenchymal volume loss with commensurate dilatation of the ventricles and sulci. Ventricular dilatation is stable. Chronic small vessel ischemic changes again noted throughout the bilateral periventricular and subcortical white matter regions, stable. There is no mass, hemorrhage, edema or other evidence of acute parenchymal abnormality. No  extra-axial hemorrhage. Vascular: Chronic calcified atherosclerotic changes of the large vessels at the skull base. No unexpected hyperdense vessel. Skull: Normal. Negative for fracture or focal lesion. Sinuses/Orbits: No acute finding. Other: None. CT CERVICAL SPINE FINDINGS Alignment: Stable.  No evidence of acute vertebral body subluxation. Skull base and vertebrae: No fracture line  or displaced fracture fragment seen. Soft tissues and spinal canal: No prevertebral fluid or swelling. No visible canal hematoma. Disc levels: Degenerative spondylitic changes within the lower cervical spine, moderate in degree with associated disc space narrowings and osseous spurring. No more than mild central canal stenosis appreciated at any level. Upper chest: No acute findings. Other: Bilateral carotid atherosclerosis. IMPRESSION: 1. No acute intracranial abnormality. No intracranial mass, hemorrhage or edema. No skull fracture. Atrophy and chronic ischemic changes in the white matter. 2. No fracture or acute subluxation within the cervical spine. Degenerative changes within the lower cervical spine, as detailed above. 3. Carotid atherosclerosis. Electronically Signed   By: Bary Richard M.D.   On: 06/10/2018 16:33    Procedures Procedures (including critical care time)  Medications Ordered in ED Medications - No data to display   Initial Impression / Assessment and Plan / ED Course  I have reviewed the triage vital signs and the nursing notes.  Pertinent labs & imaging results that were available during my care of the patient were reviewed by me and considered in my medical decision making (see chart for details).    She presents with a mechanical fall after missing the chair when sitting down, she apparently rolled to the back and hit her head on the ground.  CT head without contrast was ordered to rule out any intracranial hemorrhage.  No acute intracranial abnormality was seen, CT cervical spine was ordered  to rule out any fracture.  No fracture or acute subluxation within the cervical spine.  DG chest showed no acute abnormality or pleural effusion, pneumothorax.  DG pelvis showed no acute abnormality either.  Patient will return to [facility] with [facility] via P chart.  Vitals stable during ED visit, patient stable for discharge.  When trying to explain my findings to patient patient seems confused which is her baseline of dementia.  She is in no distress, watching TV.  We will send patient to facility with PT arc.  Final Clinical Impressions(s) / ED Diagnoses   Final diagnoses:  Fall, initial encounter    ED Discharge Orders    None       Claude Manges, Cordelia Poche 06/10/18 1750    Azalia Bilis, MD 06/10/18 1752

## 2018-06-10 NOTE — ED Triage Notes (Signed)
Note in progress

## 2018-06-10 NOTE — ED Notes (Signed)
Patient transported to X-ray 

## 2018-06-10 NOTE — ED Triage Notes (Signed)
Pt arrived via [facility] from [place] at [facility] memory care unit. Per EMS, pt was attempting to sit in chair and missed the seat. Pt struck floor in seated position, continued backward, striking head on floor. No bleeding or bruising noted at time of triage. Pt has hx of dementia; alert to self only. Pt denied any pain, dizziness, or nausea at time of triage. Pt refused compliance with c-collar, towel roll instead placed by EMS. EMS bp - 124/82.

## 2018-06-12 DIAGNOSIS — R41841 Cognitive communication deficit: Secondary | ICD-10-CM | POA: Diagnosis not present

## 2018-06-14 DIAGNOSIS — R41841 Cognitive communication deficit: Secondary | ICD-10-CM | POA: Diagnosis not present

## 2018-06-18 DIAGNOSIS — R41841 Cognitive communication deficit: Secondary | ICD-10-CM | POA: Diagnosis not present

## 2018-06-19 DIAGNOSIS — R41841 Cognitive communication deficit: Secondary | ICD-10-CM | POA: Diagnosis not present

## 2018-06-20 DIAGNOSIS — M6281 Muscle weakness (generalized): Secondary | ICD-10-CM | POA: Diagnosis not present

## 2018-06-20 DIAGNOSIS — R269 Unspecified abnormalities of gait and mobility: Secondary | ICD-10-CM | POA: Diagnosis not present

## 2018-06-26 DIAGNOSIS — R41841 Cognitive communication deficit: Secondary | ICD-10-CM | POA: Diagnosis not present

## 2018-06-28 DIAGNOSIS — R41841 Cognitive communication deficit: Secondary | ICD-10-CM | POA: Diagnosis not present

## 2018-07-03 DIAGNOSIS — R41841 Cognitive communication deficit: Secondary | ICD-10-CM | POA: Diagnosis not present

## 2018-07-18 ENCOUNTER — Inpatient Hospital Stay (HOSPITAL_COMMUNITY): Payer: Medicare HMO

## 2018-07-18 ENCOUNTER — Inpatient Hospital Stay (HOSPITAL_COMMUNITY)
Admission: EM | Admit: 2018-07-18 | Discharge: 2018-07-19 | DRG: 065 | Disposition: A | Discharge status: Hospice | Payer: Medicare HMO | Source: Skilled Nursing Facility | Attending: Internal Medicine | Admitting: Internal Medicine

## 2018-07-18 ENCOUNTER — Encounter (HOSPITAL_COMMUNITY): Payer: Self-pay | Admitting: Emergency Medicine

## 2018-07-18 ENCOUNTER — Other Ambulatory Visit: Payer: Self-pay

## 2018-07-18 ENCOUNTER — Emergency Department (HOSPITAL_COMMUNITY): Payer: Medicare HMO

## 2018-07-18 DIAGNOSIS — M2550 Pain in unspecified joint: Secondary | ICD-10-CM | POA: Diagnosis not present

## 2018-07-18 DIAGNOSIS — E872 Acidosis: Secondary | ICD-10-CM | POA: Diagnosis present

## 2018-07-18 DIAGNOSIS — Z9181 History of falling: Secondary | ICD-10-CM

## 2018-07-18 DIAGNOSIS — I639 Cerebral infarction, unspecified: Secondary | ICD-10-CM

## 2018-07-18 DIAGNOSIS — Z885 Allergy status to narcotic agent status: Secondary | ICD-10-CM

## 2018-07-18 DIAGNOSIS — I63511 Cerebral infarction due to unspecified occlusion or stenosis of right middle cerebral artery: Secondary | ICD-10-CM | POA: Diagnosis not present

## 2018-07-18 DIAGNOSIS — Z66 Do not resuscitate: Secondary | ICD-10-CM | POA: Diagnosis present

## 2018-07-18 DIAGNOSIS — Z882 Allergy status to sulfonamides status: Secondary | ICD-10-CM | POA: Diagnosis not present

## 2018-07-18 DIAGNOSIS — G309 Alzheimer's disease, unspecified: Secondary | ICD-10-CM | POA: Diagnosis present

## 2018-07-18 DIAGNOSIS — F0390 Unspecified dementia without behavioral disturbance: Secondary | ICD-10-CM | POA: Diagnosis present

## 2018-07-18 DIAGNOSIS — G459 Transient cerebral ischemic attack, unspecified: Secondary | ICD-10-CM | POA: Diagnosis not present

## 2018-07-18 DIAGNOSIS — A419 Sepsis, unspecified organism: Secondary | ICD-10-CM

## 2018-07-18 DIAGNOSIS — I370 Nonrheumatic pulmonary valve stenosis: Secondary | ICD-10-CM

## 2018-07-18 DIAGNOSIS — R0902 Hypoxemia: Secondary | ICD-10-CM | POA: Diagnosis present

## 2018-07-18 DIAGNOSIS — I63411 Cerebral infarction due to embolism of right middle cerebral artery: Secondary | ICD-10-CM | POA: Diagnosis not present

## 2018-07-18 DIAGNOSIS — I69351 Hemiplegia and hemiparesis following cerebral infarction affecting right dominant side: Secondary | ICD-10-CM | POA: Diagnosis not present

## 2018-07-18 DIAGNOSIS — I252 Old myocardial infarction: Secondary | ICD-10-CM | POA: Diagnosis not present

## 2018-07-18 DIAGNOSIS — I2510 Atherosclerotic heart disease of native coronary artery without angina pectoris: Secondary | ICD-10-CM | POA: Diagnosis present

## 2018-07-18 DIAGNOSIS — Z880 Allergy status to penicillin: Secondary | ICD-10-CM

## 2018-07-18 DIAGNOSIS — R58 Hemorrhage, not elsewhere classified: Secondary | ICD-10-CM | POA: Diagnosis not present

## 2018-07-18 DIAGNOSIS — I4891 Unspecified atrial fibrillation: Secondary | ICD-10-CM | POA: Diagnosis not present

## 2018-07-18 DIAGNOSIS — I482 Chronic atrial fibrillation, unspecified: Secondary | ICD-10-CM | POA: Diagnosis present

## 2018-07-18 DIAGNOSIS — G8194 Hemiplegia, unspecified affecting left nondominant side: Secondary | ICD-10-CM | POA: Diagnosis present

## 2018-07-18 DIAGNOSIS — Z993 Dependence on wheelchair: Secondary | ICD-10-CM

## 2018-07-18 DIAGNOSIS — N390 Urinary tract infection, site not specified: Secondary | ICD-10-CM | POA: Diagnosis present

## 2018-07-18 DIAGNOSIS — F0150 Vascular dementia without behavioral disturbance: Secondary | ICD-10-CM

## 2018-07-18 DIAGNOSIS — I361 Nonrheumatic tricuspid (valve) insufficiency: Secondary | ICD-10-CM | POA: Diagnosis not present

## 2018-07-18 DIAGNOSIS — F0280 Dementia in other diseases classified elsewhere without behavioral disturbance: Secondary | ICD-10-CM | POA: Diagnosis present

## 2018-07-18 DIAGNOSIS — Z8249 Family history of ischemic heart disease and other diseases of the circulatory system: Secondary | ICD-10-CM

## 2018-07-18 DIAGNOSIS — Z515 Encounter for palliative care: Secondary | ICD-10-CM

## 2018-07-18 DIAGNOSIS — Z823 Family history of stroke: Secondary | ICD-10-CM | POA: Diagnosis not present

## 2018-07-18 DIAGNOSIS — R29716 NIHSS score 16: Secondary | ICD-10-CM | POA: Diagnosis present

## 2018-07-18 DIAGNOSIS — Z7189 Other specified counseling: Secondary | ICD-10-CM | POA: Diagnosis not present

## 2018-07-18 DIAGNOSIS — R404 Transient alteration of awareness: Secondary | ICD-10-CM | POA: Diagnosis not present

## 2018-07-18 DIAGNOSIS — I10 Essential (primary) hypertension: Secondary | ICD-10-CM | POA: Diagnosis present

## 2018-07-18 DIAGNOSIS — E162 Hypoglycemia, unspecified: Secondary | ICD-10-CM | POA: Diagnosis not present

## 2018-07-18 DIAGNOSIS — E161 Other hypoglycemia: Secondary | ICD-10-CM | POA: Diagnosis not present

## 2018-07-18 DIAGNOSIS — R4182 Altered mental status, unspecified: Secondary | ICD-10-CM

## 2018-07-18 DIAGNOSIS — R000 Tachycardia, unspecified: Secondary | ICD-10-CM | POA: Diagnosis not present

## 2018-07-18 DIAGNOSIS — R69 Illness, unspecified: Secondary | ICD-10-CM | POA: Diagnosis not present

## 2018-07-18 DIAGNOSIS — Z7401 Bed confinement status: Secondary | ICD-10-CM | POA: Diagnosis not present

## 2018-07-18 LAB — URINALYSIS, ROUTINE W REFLEX MICROSCOPIC
BILIRUBIN URINE: NEGATIVE
Glucose, UA: NEGATIVE mg/dL
KETONES UR: 80 mg/dL — AB
Nitrite: POSITIVE — AB
Protein, ur: 30 mg/dL — AB
Specific Gravity, Urine: 1.018 (ref 1.005–1.030)
pH: 5 (ref 5.0–8.0)

## 2018-07-18 LAB — COMPREHENSIVE METABOLIC PANEL
ALK PHOS: 54 U/L (ref 38–126)
ALT: 12 U/L (ref 0–44)
AST: 29 U/L (ref 15–41)
Albumin: 2.8 g/dL — ABNORMAL LOW (ref 3.5–5.0)
Anion gap: 10 (ref 5–15)
BILIRUBIN TOTAL: 1.3 mg/dL — AB (ref 0.3–1.2)
BUN: 12 mg/dL (ref 8–23)
CALCIUM: 8.6 mg/dL — AB (ref 8.9–10.3)
CHLORIDE: 106 mmol/L (ref 98–111)
CO2: 23 mmol/L (ref 22–32)
CREATININE: 0.96 mg/dL (ref 0.44–1.00)
GFR, EST AFRICAN AMERICAN: 60 mL/min — AB (ref >60)
GFR, EST NON AFRICAN AMERICAN: 52 mL/min — AB (ref >60)
Glucose, Bld: 100 mg/dL — ABNORMAL HIGH (ref 70–99)
Potassium: 4.7 mmol/L (ref 3.5–5.1)
Sodium: 139 mmol/L (ref 135–145)
TOTAL PROTEIN: 7.7 g/dL (ref 6.5–8.1)

## 2018-07-18 LAB — CBC WITH DIFFERENTIAL/PLATELET
Abs Immature Granulocytes: 0 10*3/uL (ref 0.00–0.07)
Basophils Absolute: 0 10*3/uL (ref 0.0–0.1)
Basophils Relative: 0 %
EOS ABS: 0.1 10*3/uL (ref 0.0–0.5)
EOS PCT: 1 %
HEMATOCRIT: 39.4 % (ref 36.0–46.0)
HEMOGLOBIN: 11.5 g/dL — AB (ref 12.0–15.0)
LYMPHS PCT: 9 %
Lymphs Abs: 0.8 10*3/uL (ref 0.7–4.0)
MCH: 27.6 pg (ref 26.0–34.0)
MCHC: 29.2 g/dL — AB (ref 30.0–36.0)
MCV: 94.7 fL (ref 80.0–100.0)
MONO ABS: 0.7 10*3/uL (ref 0.1–1.0)
Monocytes Relative: 7 %
NRBC: 0 % (ref 0.0–0.2)
Neutro Abs: 7.7 10*3/uL (ref 1.7–7.7)
Neutrophils Relative %: 83 %
Platelets: 199 10*3/uL (ref 150–400)
RBC: 4.16 MIL/uL (ref 3.87–5.11)
RDW: 17.3 % — AB (ref 11.5–15.5)
WBC: 9.3 10*3/uL (ref 4.0–10.5)
nRBC: 0 /100 WBC

## 2018-07-18 LAB — I-STAT TROPONIN, ED: Troponin i, poc: 0.01 ng/mL (ref 0.00–0.08)

## 2018-07-18 LAB — I-STAT CG4 LACTIC ACID, ED
LACTIC ACID, VENOUS: 2.05 mmol/L — AB (ref 0.5–1.9)
LACTIC ACID, VENOUS: 3.82 mmol/L — AB (ref 0.5–1.9)

## 2018-07-18 LAB — MRSA PCR SCREENING: MRSA by PCR: NEGATIVE

## 2018-07-18 LAB — CBG MONITORING, ED: GLUCOSE-CAPILLARY: 79 mg/dL (ref 70–99)

## 2018-07-18 MED ORDER — ASPIRIN 325 MG PO TABS: 325.0000 mg | ORAL_TABLET | Freq: Every day | ORAL | Status: DC

## 2018-07-18 MED ORDER — SODIUM CHLORIDE 0.9 % IV SOLN
2.0000 g | INTRAVENOUS | Status: DC
  Filled 2018-07-18: qty 2

## 2018-07-18 MED ORDER — SODIUM CHLORIDE 0.9 % IV BOLUS
500.0000 mL | Freq: Once | INTRAVENOUS | Status: AC
  Administered 2018-07-18: 500 mL via INTRAVENOUS

## 2018-07-18 MED ORDER — FAMOTIDINE 40 MG/5ML PO SUSR
40.0000 mg | Freq: Every day | ORAL | Status: DC
  Filled 2018-07-18: qty 5

## 2018-07-18 MED ORDER — QUETIAPINE FUMARATE 25 MG PO TABS
25.0000 mg | ORAL_TABLET | Freq: Two times a day (BID) | ORAL | Status: DC
Start: 2018-07-18 — End: 2018-07-19

## 2018-07-18 MED ORDER — ACETAMINOPHEN 500 MG PO TABS: 1000.0000 mg | ORAL_TABLET | Freq: Three times a day (TID) | ORAL | Status: DC | PRN

## 2018-07-18 MED ORDER — IPRATROPIUM-ALBUTEROL 0.5-2.5 (3) MG/3ML IN SOLN
3.0000 mL | RESPIRATORY_TRACT | Status: DC | PRN
  Administered 2018-07-18: 3 mL via RESPIRATORY_TRACT

## 2018-07-18 MED ORDER — DIVALPROEX SODIUM 250 MG PO DR TAB: 250.0000 mg | DELAYED_RELEASE_TABLET | Freq: Two times a day (BID) | ORAL | Status: DC

## 2018-07-18 MED ORDER — FERROUS SULFATE 325 (65 FE) MG PO TABS: 325.0000 mg | ORAL_TABLET | Freq: Every day | ORAL | Status: DC

## 2018-07-18 MED ORDER — HALOPERIDOL LACTATE 5 MG/ML IJ SOLN
2.0000 mg | Freq: Once | INTRAMUSCULAR | Status: AC
  Administered 2018-07-18: 2 mg via INTRAVENOUS
  Filled 2018-07-18: qty 1

## 2018-07-18 MED ORDER — DIGOXIN 0.25 MG/ML IJ SOLN: 0.0625 mg | Freq: Every day | INTRAMUSCULAR | Status: DC

## 2018-07-18 MED ORDER — PRAVASTATIN SODIUM 40 MG PO TABS: 80.0000 mg | ORAL_TABLET | Freq: Every day | ORAL | Status: DC

## 2018-07-18 MED ORDER — DIGOXIN 0.25 MG/ML IJ SOLN
0.0625 mg | Freq: Every day | INTRAMUSCULAR | Status: DC
  Administered 2018-07-19: 0.0625 mg via INTRAVENOUS
  Filled 2018-07-18: qty 0.25

## 2018-07-18 MED ORDER — VALPROATE SODIUM 500 MG/5ML IV SOLN
250.0000 mg | Freq: Two times a day (BID) | INTRAVENOUS | Status: DC
  Administered 2018-07-18 – 2018-07-19 (×2): 250 mg via INTRAVENOUS
  Filled 2018-07-18 (×2): qty 2.5

## 2018-07-18 MED ORDER — CHLORHEXIDINE GLUCONATE 0.12 % MT SOLN
15.0000 mL | Freq: Two times a day (BID) | OROMUCOSAL | Status: DC
  Administered 2018-07-18: 15 mL via OROMUCOSAL
  Filled 2018-07-18: qty 15

## 2018-07-18 MED ORDER — ACETAMINOPHEN 650 MG RE SUPP
650.0000 mg | Freq: Four times a day (QID) | RECTAL | Status: DC | PRN
  Administered 2018-07-18: 650 mg via RECTAL
  Filled 2018-07-18: qty 1

## 2018-07-18 MED ORDER — VANCOMYCIN HCL IN DEXTROSE 1-5 GM/200ML-% IV SOLN: 1000.0000 mg | Freq: Once | INTRAVENOUS | Status: DC

## 2018-07-18 MED ORDER — POLYETHYLENE GLYCOL 3350 17 G PO PACK: 17.0000 g | PACK | Freq: Every day | ORAL | Status: DC

## 2018-07-18 MED ORDER — STROKE: EARLY STAGES OF RECOVERY BOOK
Freq: Once | Status: AC
  Administered 2018-07-19
  Filled 2018-07-18: qty 1

## 2018-07-18 MED ORDER — SENNOSIDES-DOCUSATE SODIUM 8.6-50 MG PO TABS: 1.0000 | ORAL_TABLET | Freq: Every evening | ORAL | Status: DC | PRN

## 2018-07-18 MED ORDER — LACTATED RINGERS IV BOLUS
1000.0000 mL | Freq: Once | INTRAVENOUS | Status: AC
  Administered 2018-07-18: 1000 mL via INTRAVENOUS

## 2018-07-18 MED ORDER — VANCOMYCIN HCL 10 G IV SOLR
1500.0000 mg | Freq: Once | INTRAVENOUS | Status: AC
  Administered 2018-07-18: 1500 mg via INTRAVENOUS
  Filled 2018-07-18: qty 1500

## 2018-07-18 MED ORDER — SODIUM CHLORIDE 0.9 % IV SOLN
INTRAVENOUS | Status: DC
  Administered 2018-07-18 – 2018-07-19 (×3): via INTRAVENOUS

## 2018-07-18 MED ORDER — ENOXAPARIN SODIUM 30 MG/0.3ML ~~LOC~~ SOLN
30.0000 mg | SUBCUTANEOUS | Status: DC
  Administered 2018-07-18: 30 mg via SUBCUTANEOUS
  Filled 2018-07-18: qty 0.3

## 2018-07-18 MED ORDER — ATORVASTATIN CALCIUM 20 MG PO TABS: 80.0000 mg | ORAL_TABLET | Freq: Every day | ORAL | Status: DC

## 2018-07-18 MED ORDER — IBUPROFEN 200 MG PO TABS: 400.0000 mg | ORAL_TABLET | Freq: Once | ORAL | Status: DC

## 2018-07-18 MED ORDER — DIGOXIN 125 MCG PO TABS: 0.0625 mg | ORAL_TABLET | Freq: Every day | ORAL | Status: DC

## 2018-07-18 MED ORDER — ORAL CARE MOUTH RINSE: 15.0000 mL | Freq: Two times a day (BID) | OROMUCOSAL | Status: DC

## 2018-07-18 MED ORDER — SODIUM CHLORIDE 0.9 % IV SOLN
2.0000 g | Freq: Once | INTRAVENOUS | Status: AC
  Administered 2018-07-18: 2 g via INTRAVENOUS
  Filled 2018-07-18: qty 2

## 2018-07-18 MED ORDER — VANCOMYCIN HCL IN DEXTROSE 750-5 MG/150ML-% IV SOLN
750.0000 mg | Freq: Two times a day (BID) | INTRAVENOUS | Status: DC
  Administered 2018-07-18: 750 mg via INTRAVENOUS
  Filled 2018-07-18 (×2): qty 150

## 2018-07-18 NOTE — Progress Notes (Addendum)
Pharmacy Antibiotic Note  Connie Wagner is a 84 y.o. female admitted on 07/18/2018 with AMS.  Pharmacy has been consulted for vancomycin dosing. Pt is afebrile and WBC is pending. SCr is WNL and lactic acid is slightly elevated.   Plan: Vancomycin 1500mg  IV x 1 then 750mg  IV Q12H F/u renal fxn, C&S, clinical status and trough at [facility] F/u continuation of cefepime or other gram negative coverage  Addendum: Continuing cefepime 2gm IV Q24H    Temp (24hrs), Avg:99.5 F (37.5 C), Min:99.5 F (37.5 C), Max:99.5 F (37.5 C)  Recent Labs  Lab 07/18/18 0958  LATICACIDVEN 2.05*    CrCl cannot be calculated (Patient's most recent lab result is older than the maximum 21 days allowed.).    Allergies  Allergen Reactions  . Amoxicillin-Pot Clavulanate Other (See Comments)    "Allergic," per MAR  . Codeine Other (See Comments)    "Allergic," per MAR  . Escitalopram Oxalate Other (See Comments)    "Allergic," per MAR  . Iron Nausea And Vomiting and Other (See Comments)    Loose stools, also  . Losartan Potassium Other (See Comments)    "Allergic," per MAR  . Metronidazole Other (See Comments)    "Allergic," per MAR  . Raloxifene Hcl Other (See Comments)    "Allergic," per MAR  . Tetracycline Hcl Other (See Comments)    "Allergic," per MAR  . Penicillins Rash    Has patient had a PCN reaction causing immediate rash, facial/tongue/throat swelling, SOB or lightheadedness with hypotension: Yes Has patient had a PCN reaction causing severe rash involving mucus membranes or skin necrosis: No Has patient had a PCN reaction that required hospitalization No Has patient had a PCN reaction occurring within the last 10 years: No If all of the above answers are "NO", then may proceed with Cephalosporin use.   . Raloxifene Rash  . Sulfa Drugs Cross Reactors Rash    Antimicrobials this admission: Vanc 11/20>> Cefepime x 1 11/20  Dose adjustments this admission: N/A  Microbiology  results: Pending  Thank you for allowing pharmacy to be a part of this patient's care.  Han Vejar, 07/20/18 07/18/2018 10:08 AM

## 2018-07-18 NOTE — ED Notes (Signed)
Patient transported to MRI 

## 2018-07-18 NOTE — Progress Notes (Signed)
Pt came from ED. Report received at 1650. Pt. Nonverbal and don't follow commands; vitals taken. Skin assessment completed with second RN. No pressure ulcer or open wounds noted. Telemetry applied with 2nd Rn and CCMD confirmed. Pt oriented to room and unit; daughter at bedside; and call light within reach. Bed alarm on. Will continue to closely monitor.   07/18/18 1845  Vitals  Temp 99.8 F (37.7 C)  Temp Source Axillary  BP (!) 141/85  BP Location Right Arm  BP Method Automatic  Patient Position (if appropriate) Lying  Pulse Rate (!) 136  Pulse Rate Source Monitor  Cardiac Rhythm Atrial fibrillation  Oxygen Therapy  SpO2 99 %  O2 Device Nasal Cannula  O2 Flow Rate (L/min) 2 L/min

## 2018-07-18 NOTE — H&P (Addendum)
History and Physical    Connie Wagner NWG:[id number] DOB: 12-15-1928 DOA: 07/18/2018  PCP: Merlene Laughter, MD Consultants:  Neuro Patient coming from: SNF  Chief Complaint: AMS2  HPI: Connie Wagner is a 84 y.o. female with history of multiple medical problems including CAD status post acute inferior MI, PCI 2014, ischemic cardiomyopathy, diastolic CHF, atrial fibrillation, hypertension, prior stroke 06/2017  (for which she received tPA), carotid stenosis, Alzheimer's dementia, depression, anxiety, anemia who presented to the ED from [facility] SNF today due to altered mental status.  Per reports from nursing facility, patient is nonverbal and not oriented at baseline.  However, at baseline she does mumble and have a small amount of interaction with others.  Review of her chart reveals that she has had several admissions, around 1 a month, for falls.  According to the staff at her nursing facility, since yesterday she has not been acting like herself. She has been less interactive, more lethargic, not eating per usual.  She was tachycardic in route per EMS.  A baby aspirin is on patient's prior to admission meds but according to records from the facility patient is not currently taking it for unclear reasons.  Take pravastatin 40 mg daily as well as digoxin and Lopressor for her A. fib.   ED Course:  Temperature 99.5, blood pressure 148/89 and right arm, heart rate 148, respiratory rate 32.  According to chart, she was 100% on room air.  During my interview, she was de-satting into the mid to low 80s on 2 L nasal cannula.  Chest x-ray was unremarkable.  Lactate is elevated at 2.05 which trended up to 3.82.  Code sepsis was initiated and she was given 1 L lactated Ringer's bolus, vancomycin and cefepime.  On exam she was noted to have a right gaze deviation which prompted head CT without contrast which revealed acute/subacute infarction affecting right MCA branch vessel territory serving the right  temporal lobe and temporoparietal junction region. Mild swelling but no hemorrhage. No significant mass effect; extensive chronic small-vessel ischemic changes elsewhere throughout brain.  Patient was noted to be pulling at lines and tubes and was therefore given Haldol 2 mg IV x1.  She was noted to be wheezing and having decreased oxygen saturations.   Review of Systems: As per HPI; otherwise review of systems reviewed and negative.   Ambulatory Status: Nonambulatory   Past Medical History:  Diagnosis Date  . Anemia    iron deficient  . Anxiety   . Atrial fibrillation (HCC)    per daughter  . CAD (coronary artery disease)    a. s/p Inf STEMI 2/14 => LHC 10/18/12: Diffuse distal LM 30%, LAD 50% calcified stenosis just after first septal perforator, diagonal branches with mild 20-30% stenosis, mid RCA 99%. PCI: Promus Premier DES to the mid RCA  . Carotid stenosis    a. [employer] screening done in 2009 with carotid stenosis noted;  b.  Carotid dopplers 3/14: R 0-39%, L 40-59%, repeat 1 year  . Chronic lower back pain   . 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 Dementia (HCC)    needs redirection freq.  . Depression   . Hypertension   . Ischemic cardiomyopathy    a. Echocardiogram 10/18/12: Mild LVH, inferior and inferoseptal AK, posterior HK, EF 45%, grade 1 diastolic dysfunction, aortic sclerosis without stenosis, mild MR, mild LAE, RV systolic function moderately reduced (pacer wire in right ventricle), PASP  32  . Migraines    "haven't had one since going thru menopause"  . Rheumatoid arthritis(714.0)   . Upper GI bleeding 09/14/11   "have had it once before today"    Past Surgical History:  Procedure Laterality Date  . BACK SURGERY    . BREAST LUMPECTOMY     "benign tumor removed"  . ESOPHAGOGASTRODUODENOSCOPY  09/15/2011   Procedure: ESOPHAGOGASTRODUODENOSCOPY (EGD);  Surgeon: Barrie Folk, MD;   Location: [facility] ENDOSCOPY;  Service: Endoscopy;  Laterality: N/A;  . HARVEST BONE GRAFT  08/17/2012   Procedure: HARVEST ILIAC BONE GRAFT;  Surgeon: Eulas Post, MD;  Location: [facility] OR;  Service: Orthopedics;  Laterality: Right;  . HEMATOMA EVACUATION  10/2002   lumbar  . INCISION AND DRAINAGE OF WOUND  10/2002   lumbar wound infection  . JOINT REPLACEMENT    . KNEE ARTHROSCOPY  01/2001   right  . LEFT HEART CATHETERIZATION WITH CORONARY ANGIOGRAM N/A 10/18/2012   Procedure: LEFT HEART CATHETERIZATION WITH CORONARY ANGIOGRAM;  Surgeon: Tonny Bollman, MD;  Location: [facility] CATH LAB;  Service: Cardiovascular;  Laterality: N/A;  . LUMBAR DISC SURGERY  09/2002   Hemilaminectomy, L3, complete laminectomy, L4, hemi- semilaminectomy, L5 and right S1, and diskectomy, L5-S1.  Marland Kitchen LUMBAR LAMINECTOMY  10/2003   for epidural abscess evacuation; L4-5  . ORIF HUMERUS FRACTURE  08/17/2012   Procedure: OPEN REDUCTION INTERNAL FIXATION (ORIF) DISTAL HUMERUS FRACTURE;  Surgeon: Eulas Post, MD;  Location: [facility] OR;  Service: Orthopedics;  Laterality: Right;  ORIF RIGHT SUPRACHONDULAR HUMERUS FRACTURE  . TOTAL KNEE ARTHROPLASTY  01/2002   right    Social History   Socioeconomic History  . Marital status: Widowed    Spouse name: Not on file  . Number of children: Not on file  . Years of education: Not on file  . Highest education level: Not on file  Occupational History  . Occupation: Retired Engineer, civil (consulting)    Comment: 40 years  Social Needs  . Financial resource strain: Not on file  . Food insecurity:    Worry: Not on file    Inability: Not on file  . Transportation needs:    Medical: Not on file    Non-medical: Not on file  Tobacco Use  . Smoking status: Never Smoker  . Smokeless tobacco: Never Used  Substance and Sexual Activity  . Alcohol use: Yes    Alcohol/week: 5.0 standard drinks    Types: 5 Glasses of wine per week  . Drug use: No  . Sexual activity: Never  Lifestyle  . Physical activity:    Days  per week: Not on file    Minutes per session: Not on file  . Stress: Not on file  Relationships  . Social connections:    Talks on phone: Not on file    Gets together: Not on file    Attends religious service: Not on file    Active member of club or organization: Not on file    Attends meetings of clubs or organizations: Not on file    Relationship status: Not on file  . Intimate partner violence:    Fear of current or ex partner: Not on file    Emotionally abused: Not on file    Physically abused: Not on file    Forced sexual activity: Not on file  Other Topics Concern  . Not on file  Social History Narrative   Daughter and Emergency Contact: Desmond Dike   Cell [phone]  Home [phone]    Allergies  Allergen Reactions  . Amoxicillin-Pot Clavulanate Other (See Comments)    "Allergic," per MAR  . Codeine Other (See Comments)    "Allergic," per MAR  . Escitalopram Oxalate Other (See Comments)    "Allergic," per MAR  . Iron Nausea And Vomiting and Other (See Comments)    Loose stools, also  . Losartan Potassium Other (See Comments)    "Allergic," per MAR  . Metronidazole Other (See Comments)    "Allergic," per MAR  . Raloxifene Hcl Other (See Comments)    "Allergic," per MAR  . Tetracycline Hcl Other (See Comments)    "Allergic," per MAR  . Penicillins Rash    Has patient had a PCN reaction causing immediate rash, facial/tongue/throat swelling, SOB or lightheadedness with hypotension: Yes Has patient had a PCN reaction causing severe rash involving mucus membranes or skin necrosis: No Has patient had a PCN reaction that required hospitalization No Has patient had a PCN reaction occurring within the last 10 years: No If all of the above answers are "NO", then may proceed with Cephalosporin use.   . Raloxifene Rash  . Sulfa Drugs Cross Reactors Rash    Family History  Problem Relation Age of Onset  . Heart attack Mother   . Stroke Mother   . Hypertension  Sister   . Anesthesia problems Neg Hx   . Hypotension Neg Hx   . Malignant hyperthermia Neg Hx   . Pseudochol deficiency Neg Hx     Prior to Admission medications   Medication Sig Start Date End Date Taking? Authorizing Provider  acetaminophen (TYLENOL) 500 MG tablet Take 1,000 mg by mouth every 8 (eight) hours as needed for mild pain.   Yes [provider]  digoxin (LANOXIN) 0.125 MG tablet Take 1 tablet (125 mcg total) by mouth daily. Patient taking differently: Take 0.0625 mcg by mouth daily.  10/30/17 10/30/18 Yes Leroy Sea, MD  divalproex (DEPAKOTE) 250 MG DR tablet Take 250 mg by mouth 2 (two) times daily.   Yes [provider]  famotidine (PEPCID) 40 MG/5ML suspension Take 40 mg by mouth daily.   Yes [provider]  ferrous sulfate 325 (65 FE) MG tablet Take 325 mg by mouth daily with breakfast.   Yes [provider]  metoprolol tartrate (LOPRESSOR) 50 MG tablet Take 1.5 tablets (75 mg total) by mouth 2 (two) times daily. Patient taking differently: Take 50 mg by mouth 2 (two) times daily.  10/30/17 10/30/18 Yes Leroy Sea, MD  polyethylene glycol (MIRALAX / GLYCOLAX) packet Take 17 g by mouth daily. 10/28/17  Yes Ghimire, Werner Lean, MD  QUEtiapine (SEROQUEL) 25 MG tablet Take 1 tablet (25 mg total) 2 (two) times daily by mouth. 07/06/17  Yes Costello, Lamar Blinks, NP  acetaminophen (TYLENOL) 325 MG tablet Take 2 tablets (650 mg total) every 6 (six) hours as needed by mouth for mild pain or fever. Patient not taking: Reported on 07/18/2018 07/06/17   Beryl Meager, NP  aspirin EC 81 MG EC tablet Take 1 tablet (81 mg total) daily by mouth. Patient not taking: Reported on 06/10/2018 07/06/17   Beryl Meager, NP  benzonatate (TESSALON) 200 MG capsule Take 1 capsule (200 mg total) by mouth 3 (three) times daily as needed for cough. Patient not taking: Reported on 03/16/2018 10/28/17   Maretta Bees, MD  guaiFENesin (MUCINEX) 600 MG 12 hr tablet  Take 1 tablet (600 mg total) by mouth  2 (two) times daily. Patient not taking: Reported on 03/16/2018 10/28/17   Maretta Bees, MD  pantoprazole (PROTONIX) 40 MG tablet Take 1 tablet (40 mg total) daily by mouth. Patient not taking: Reported on 07/18/2018 07/06/17   Beryl Meager, NP  pravastatin (PRAVACHOL) 40 MG tablet Take 1 tablet (40 mg total) daily at 6 PM by mouth. Patient not taking: Reported on 06/10/2018 07/06/17   Beryl Meager, NP    Physical Exam: Vitals:   07/18/18 1100 07/18/18 1224 07/18/18 1226 07/18/18 1230  BP: 127/86  (!) 151/75 (!) 137/123  Pulse: 78  (!) 112 (!) 106  Resp: 19  (!) 25 (!) 28  Temp:      TempSrc:      SpO2: 99%  99% 100%  Weight:  68 kg    Height:  5\' 5"  (1.651 m)       . General: Elderly, chronically ill-appearing female lying on her left side in the fetal position . Eyes:  PERRL, EOMI, normal lids, iris . ENT: Dry mucous membranes . Neck:  supple, no lymphadenopathy . Cardiovascular:  nL S1, S2, tachycardic, irregularly irregular, no murmur. Marland Kitchen Respiratory: Mildly increased work of breathing, CTA bilaterally with no wheezes/rales/rhonchi.  Normal respiratory effort. . Abdomen:  soft, NT, ND, NABS . Back:   grossly normal alignment . Skin:  no rash or lesions seen on limited exam . Musculoskeletal:  grossly normal tone BUE/BLE, good ROM, no bony abnormality or obvious joint deformity . Lower extremities: No LE edema.  Limited foot exam with no ulcerations.  2+ distal pulses. . Neurologic: Patient does not respond to verbal stimuli.  No response to sternal rub.  Several attempts at eliciting response by nasal pressure finally led to patient opening her eyes after the fifth or sixth attempt.  She has a right lateral gaze after forced eye opening; gaze does not cross the midline with doll's maneuver.  She does appear to be able to move all extremities    Radiological Exams on Admission: Dg Chest 2 View  Result Date: 07/18/2018 CLINICAL  DATA:  Altered mental status and tachycardia. EXAM: CHEST - 2 VIEW COMPARISON:  Chest x-ray dated June 10, 2018. FINDINGS: Stable cardiomediastinal silhouette. Persistent low lung volumes with bronchovascular crowding. Atherosclerotic calcification of the aortic arch. No focal consolidation, pleural effusion, or pneumothorax. No acute osseous abnormality. Chronic thoracolumbar compression deformities are unchanged. IMPRESSION: Persistent low lung volumes.  No active cardiopulmonary disease. Electronically Signed   By: Obie Dredge M.D.   On: 07/18/2018 11:27   Ct Head Wo Contrast  Result Date: 07/18/2018 CLINICAL DATA:  Dementia.  Mental status changes.  Right-sided gaze. EXAM: CT HEAD WITHOUT CONTRAST TECHNIQUE: Contiguous axial images were obtained from the base of the skull through the vertex without intravenous contrast. COMPARISON:  06/10/2018 FINDINGS: Brain: There is low-density affecting the lateral aspect of the right temporal lobe and the right temporoparietal junction region with swelling consistent with acute to subacute right MCA branch vessel infarction. No associated hemorrhage. Generalized brain atrophy elsewhere with extensive chronic small-vessel ischemic changes throughout the white matter. No hydrocephalus or extra-axial collection. Vascular: There is atherosclerotic calcification of the major vessels at the base of the brain. Skull: Normal Sinuses/Orbits: Clear/normal Other: None IMPRESSION: Acute/subacute infarction affecting right MCA branch vessel territory serving the right temporal lobe and temporoparietal junction region. Mild swelling but no hemorrhage. No significant mass effect. Extensive chronic small-vessel ischemic changes elsewhere throughout brain. Electronically Signed   By: Loraine Leriche  Shogry M.D.   On: 07/18/2018 11:53    EKG: Independently reviewed.   Date/Time:                  Wednesday July 18 2018 09:30:33 EST Ventricular Rate:         110 PR Interval:                    QRS Duration: 74 QT Interval:                 260 QTC Calculation:        352 R Axis:                         10 Text Interpretation:       Atrial fibrillation Ventricular premature complex Repol abnrm suggests ischemia, diffuse leads Baseline wander in lead(s) I II III aVR aVF V1 V2 V3 V4 V5 V6 A fib with RVR faster than previous ST depression laterally  Labs on Admission: I have personally reviewed the available labs and imaging studies at the time of the admission.  Pertinent labs:  Sodium 139 potassium 4.7 chloride 106 CO2 23 BUN 12 creatinine 0.96 glucose 100 calcium 8.6 LFTs within normal limits except for albumin of 2.8, total bilirubin 1.3 Lactic acid 05 which trended to 3.82 WBC 9.3 hemoglobin 11.5 which is close to her baseline (slightly above) platelets 199 Urinalysis: Specific gravity 1.018, pH 5, large leukocytes, positive nitrite, large hemoglobin, protein 30, ketones 80, greater than 50 WBCs, 6-10 RBCs  TTE 07/04/2017 - Left ventricle: The cavity size was normal. Wall thickness was   increased in a pattern of mild LVH. Systolic function was normal.   The estimated ejection fraction was in the range of 60% to 65%.   Wall motion was normal; there were no regional wall motion   abnormalities. Doppler parameters are consistent with abnormal   left ventricular relaxation (grade 1 diastolic dysfunction). - Aortic valve: There was trivial regurgitation. - Mitral valve: There was mild regurgitation. - Impressions: Study truncated prematurely. Normal RV and LV   function. The aortic valve is thickened with probably mild aortic   stenosis but no Doppler measurement wil ask tech to complete   study and report as an addendum. - Study truncated prematurely. Normal RV and LV function. The   aortic valve is thickened with probably mild aortic stenosis but   no Doppler measurement wil ask tech to complete study and report   as an addendum.  Carotid Dopplers  01/29/2014 Heterogeneous plaque, bilaterally. Progression of RICA stenosis, now in the 40-59% range. Stable, 40-59% LICA stenosis. >50% bilateral ECA stenosis.  Patent vertebral arteries with antegrade flow. Normal subclavian arteries, bilaterally.   Assessment/Plan Principal Problem:   Sepsis (HCC) Active Problems:   HTN (hypertension)   Dementia (HCC)   Depression   Acute lower UTI   Coronary artery disease   Atrial fibrillation with RVR (HCC)   Acute CVA (cerebrovascular accident) (HCC)   Sepsis: with T 99.5, tachycardia, AMS and lactic acidosis, U/A consistent with UTI.  Code sepsis has been initiated.  She has been treated with vancomycin, cefepime, lactated Ringer's 1 L bolus. -Admit to telemetry, inpatient -Trend lactate -Continue vancomycin, cefepime per pharmacy -Follow-up urine culture, tailor antibiotics appropriately -Will continue with gentle IV fluids for now as patient is on the hypertensive side and has a history of CHF.  For now, 75 cc/h normal saline -Daily CBC  Acute  right MCA CVA: Outside of window for TPA.  Neurology has been consulted.  Currently she is protecting her airway but she has a very guarded prognosis. -MRI of the brain without contrast, MRA of the head and neck ordered and pending -Start aspirin 325 mg daily -Check TTE, carotid US -Continue pravastatin, increased to 80 mg daily; currently patient unable to take p.o. -Permissive hypertension up to 220/120 mmHg -Check hemoglobin A1c and lipid profile in the morning -Telemetry monitoring -Frequent neuro checks -N.p.o. until patient passes swallow screen  Atrial fibrillation with RVR: Heart rate has been anywhere between 09/18/1948 since arrival.  Her blood pressure is stable and on the high side.  We will hold her beta-blocker and continue her digoxin.  Convert po to IV digoxin as patient is not able to take her po meds. Patient is not on any anticoagulation for A. fib.  Outpatient records show that  patient should be on aspirin but for unclear reasons has not been getting that this at her facility. -Start aspirin 325 mg daily -Continue digoxin, 0.0625 mg daily  Hypoxemia vs inaccurate waveform -no h/o COPD, CXR looks clear -supplemental O2 to keep sats >92% -duonebs prn  Hypertension: She is hypertensive today.  Hold metoprolol to allow for permissive hypertension.  Dementia: -cont seroquel; while not taking po, can give Haldol prn (monitor QT) -cont Depakote 250 mg twice daily, changed to IV as patient currently not able to take po  CAD:  -Aspirin 325 mg daily -Continue pravastatin, increased to 80 mg daily -Hold beta-blocker to allow for permissive hypertension   DVT prophylaxis: lovenox Code Status: DNR based on prior hospitalizations Family Communication:   Disposition Plan: TBD  Consults called: Neurology; I have also called palliative care to discuss goals of care, consider comfort care Admission status: Admit - It is my clinical opinion that admission to INPATIENT is reasonable and necessary because of the expectation that this patient will require hospital care that crosses at least 2 midnights to treat this condition based on the medical complexity of the problems presented.  Given the aforementioned information, the predictability of an adverse outcome is felt to be significant.    Elyse Hsu MD [facility]  If note is complete, please contact covering daytime or nighttime physician. [email or web address] Password [email or web address]  07/18/2018, 1:07 PM

## 2018-07-18 NOTE — Progress Notes (Signed)
  Echocardiogram 2D Echocardiogram has been performed.  Connie Wagner 07/18/2018, 3:47 PM

## 2018-07-18 NOTE — ED Notes (Signed)
Patient transported to X-ray 

## 2018-07-18 NOTE — ED Provider Notes (Signed)
[facility] EMERGENCY DEPARTMENT Provider Note   CSN: [id number] Arrival date & time: 07/18/18  0920     History   Chief Complaint Chief Complaint  Patient presents with  . Altered Mental Status    HPI Connie Wagner is a 84 y.o. female.  84yo F w/ PMH including dementia, CAD, A fib, ischemic cardiomyopathy, CVA, CHF who presents with altered mental status.  Nursing facility reported to EMS that the patient at baseline is oriented x0 and non-verbal.  Since last night, she has been not acting herself, less responsive and not eating.  EMS noted tachycardia in route.  The history is provided by the EMS personnel and the nursing home.  Altered Mental Status      Past Medical History:  Diagnosis Date  . Anemia    iron deficient  . Anxiety   . Atrial fibrillation (HCC)    per daughter  . CAD (coronary artery disease)    a. s/p Inf STEMI 2/14 => LHC 10/18/12: Diffuse distal LM 30%, LAD 50% calcified stenosis just after first septal perforator, diagonal branches with mild 20-30% stenosis, mid RCA 99%. PCI: Promus Premier DES to the mid RCA  . Carotid stenosis    a. [employer] screening done in 2009 with carotid stenosis noted;  b.  Carotid dopplers 3/14: R 0-39%, L 40-59%, repeat 1 year  . Chronic lower back pain   . 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 Dementia (HCC)    needs redirection freq.  . Depression   . Hypertension   . Ischemic cardiomyopathy    a. Echocardiogram 10/18/12: Mild LVH, inferior and inferoseptal AK, posterior HK, EF 45%, grade 1 diastolic dysfunction, aortic sclerosis without stenosis, mild MR, mild LAE, RV systolic function moderately reduced (pacer wire in right ventricle), PASP 32  . Migraines    "haven't had one since going thru menopause"  . Rheumatoid arthritis(714.0)   . Upper GI bleeding 09/14/11   "have had it once before today"    Patient  Active Problem List   Diagnosis Date Noted  . Atrial fibrillation with RVR (HCC) 10/26/2017  . Wheezing   . Weakness   . Palliative care by specialist   . Goals of care, counseling/discussion   . Acute pneumonia 10/22/2017  . Shortness of breath 10/22/2017  . Carotid stenosis, bilateral 07/05/2017  . Cerebral embolism with cerebral infarction 07/04/2017  . Stroke (cerebrum) (HCC) 07/04/2017  . Physical deconditioning 10/23/2012  . Cardiogenic shock (HCC) 10/23/2012  . Wenckebach second degree AV block 10/20/2012  . Complete heart block (HCC) 10/19/2012  . Acute renal failure (HCC) 10/19/2012  . Acute lower UTI 10/19/2012  . Acute systolic CHF (congestive heart failure) (HCC) 10/19/2012  . Normocytic anemia 10/19/2012  . S/P drug eluting coronary stent placement 10/19/2012  . Coronary artery disease 10/19/2012  . Acute MI, inferior wall, initial episode of care (HCC) 10/18/2012  . Urinary tract infection, E. coli 08/21/2012  . Dementia (HCC)   . Depression   . Prepyloric ulcer 09/15/2011  . Hypotension due to blood loss 09/15/2011  . Leukocytosis 09/15/2011  . Extrarenal azotemia 09/15/2011  . GI bleeding 09/14/2011  . Anemia associated with acute blood loss 09/14/2011  . Tachycardia 09/14/2011  . HTN (hypertension) 09/14/2011  . Anxiety 09/14/2011  . ETOH abuse 09/14/2011  . 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 09/14/2011    Past  Surgical History:  Procedure Laterality Date  . BACK SURGERY    . BREAST LUMPECTOMY     "benign tumor removed"  . ESOPHAGOGASTRODUODENOSCOPY  09/15/2011   Procedure: ESOPHAGOGASTRODUODENOSCOPY (EGD);  Surgeon: Barrie Folk, MD;  Location: [facility] ENDOSCOPY;  Service: Endoscopy;  Laterality: N/A;  . HARVEST BONE GRAFT  08/17/2012   Procedure: HARVEST ILIAC BONE GRAFT;  Surgeon: Eulas Post, MD;  Location: [facility] OR;  Service: Orthopedics;  Laterality: Right;  . HEMATOMA EVACUATION   10/2002   lumbar  . INCISION AND DRAINAGE OF WOUND  10/2002   lumbar wound infection  . JOINT REPLACEMENT    . KNEE ARTHROSCOPY  01/2001   right  . LEFT HEART CATHETERIZATION WITH CORONARY ANGIOGRAM N/A 10/18/2012   Procedure: LEFT HEART CATHETERIZATION WITH CORONARY ANGIOGRAM;  Surgeon: Tonny Bollman, MD;  Location: [facility] CATH LAB;  Service: Cardiovascular;  Laterality: N/A;  . LUMBAR DISC SURGERY  09/2002   Hemilaminectomy, L3, complete laminectomy, L4, hemi- semilaminectomy, L5 and right S1, and diskectomy, L5-S1.  Marland Kitchen LUMBAR LAMINECTOMY  10/2003   for epidural abscess evacuation; L4-5  . ORIF HUMERUS FRACTURE  08/17/2012   Procedure: OPEN REDUCTION INTERNAL FIXATION (ORIF) DISTAL HUMERUS FRACTURE;  Surgeon: Eulas Post, MD;  Location: [facility] OR;  Service: Orthopedics;  Laterality: Right;  ORIF RIGHT SUPRACHONDULAR HUMERUS FRACTURE  . TOTAL KNEE ARTHROPLASTY  01/2002   right     OB History    Gravida  0   Para  0   Term  0   Preterm  0   AB  0   Living  0     SAB  0   TAB  0   Ectopic  0   Multiple  0   Live Births               Home Medications    Prior to Admission medications   Medication Sig Start Date End Date Taking? Authorizing Provider  acetaminophen (TYLENOL) 325 MG tablet Take 2 tablets (650 mg total) every 6 (six) hours as needed by mouth for mild pain or fever. Patient taking differently: Take 650 mg by mouth.  07/06/17   Beryl Meager, NP  aspirin EC 81 MG EC tablet Take 1 tablet (81 mg total) daily by mouth. Patient not taking: Reported on 06/10/2018 07/06/17   Beryl Meager, NP  benzonatate (TESSALON) 200 MG capsule Take 1 capsule (200 mg total) by mouth 3 (three) times daily as needed for cough. Patient not taking: Reported on 03/16/2018 10/28/17   Maretta Bees, MD  digoxin (LANOXIN) 0.125 MG tablet Take 1 tablet (125 mcg total) by mouth daily. Patient taking differently: Take 0.0625 mcg by mouth daily.  10/30/17 10/30/18  Leroy Sea, MD    divalproex (DEPAKOTE) 250 MG DR tablet Take 250 mg by mouth 2 (two) times daily.    [provider]  ferrous sulfate 325 (65 FE) MG tablet Take 325 mg by mouth daily with breakfast.    [provider]  guaiFENesin (MUCINEX) 600 MG 12 hr tablet Take 1 tablet (600 mg total) by mouth 2 (two) times daily. Patient not taking: Reported on 03/16/2018 10/28/17   Maretta Bees, MD  metoprolol tartrate (LOPRESSOR) 50 MG tablet Take 1.5 tablets (75 mg total) by mouth 2 (two) times daily. Patient taking differently: Take 50 mg by mouth 2 (two) times daily.  10/30/17 10/30/18  Leroy Sea, MD  pantoprazole (PROTONIX) 40 MG tablet Take 1 tablet (  40 mg total) daily by mouth. 07/06/17   Beryl Meager, NP  polyethylene glycol (MIRALAX / GLYCOLAX) packet Take 17 g by mouth daily. Patient not taking: Reported on 06/10/2018 10/28/17   Maretta Bees, MD  Polyethylene Glycol 3350 (PEG 3350) POWD Take 17 g by mouth See admin instructions. Mix 17 grams into 8 ounces of water and drink once a day    [provider]  pravastatin (PRAVACHOL) 40 MG tablet Take 1 tablet (40 mg total) daily at 6 PM by mouth. Patient not taking: Reported on 06/10/2018 07/06/17   Beryl Meager, NP  QUEtiapine (SEROQUEL) 25 MG tablet Take 1 tablet (25 mg total) 2 (two) times daily by mouth. 07/06/17   Beryl Meager, NP    Family History Family History  Problem Relation Age of Onset  . Heart attack Mother   . Stroke Mother   . Hypertension Sister   . Anesthesia problems Neg Hx   . Hypotension Neg Hx   . Malignant hyperthermia Neg Hx   . Pseudochol deficiency Neg Hx     Social History Social History   Tobacco Use  . Smoking status: Never Smoker  . Smokeless tobacco: Never Used  Substance Use Topics  . Alcohol use: Yes    Alcohol/week: 5.0 standard drinks    Types: 5 Glasses of wine per week  . Drug use: No     Allergies   Amoxicillin-pot clavulanate; Codeine; Escitalopram oxalate;  Iron; Losartan potassium; Metronidazole; Raloxifene hcl; Tetracycline hcl; Penicillins; Raloxifene; and Sulfa drugs cross reactors   Review of Systems Review of Systems  Unable to perform ROS: Dementia     Physical Exam Updated Vital Signs BP (!) 148/89 (BP Location: Right Arm)   Pulse (!) 148   Temp 99.5 F (37.5 C) (Rectal)   Resp (!) 32   SpO2 100%   Physical Exam  Constitutional: She appears well-developed. No distress.  Frail, ill appearing, awake  HENT:  Head: Normocephalic and atraumatic.  Moist mucous membranes  Eyes: Pupils are equal, round, and reactive to light. Conjunctivae are normal.  Rightward gaze deviation, unable to get to cross midline  Neck: Neck supple.  Cardiovascular: An irregularly irregular rhythm present. Tachycardia present.  Murmur heard. Pulmonary/Chest: Effort normal and breath sounds normal. Tachypnea noted. No respiratory distress.  Abdominal: Soft. Bowel sounds are normal. She exhibits no distension. There is no tenderness.  Musculoskeletal: She exhibits no edema.  Neurological: She is alert.  Disoriented, non-verbal, unable to follow commands; global increased muscle tone, withdraws all 4 ext to pain; no clonus  Skin: Skin is warm and dry.  Nursing note and vitals reviewed.    ED Treatments / Results  Labs (all labs ordered are listed, but only abnormal results are displayed) Labs Reviewed  COMPREHENSIVE METABOLIC PANEL - Abnormal; Notable for the following components:      Result Value   Glucose, Bld 100 (*)    Calcium 8.6 (*)    Albumin 2.8 (*)    Total Bilirubin 1.3 (*)    GFR calc non Af Amer 52 (*)    GFR calc Af Amer 60 (*)    All other components within normal limits  CBC WITH DIFFERENTIAL/PLATELET - Abnormal; Notable for the following components:   Hemoglobin 11.5 (*)    MCHC 29.2 (*)    RDW 17.3 (*)    All other components within normal limits  URINALYSIS, ROUTINE W REFLEX MICROSCOPIC - Abnormal; Notable for the  following components:  APPearance CLOUDY (*)    Hgb urine dipstick LARGE (*)    Ketones, ur 80 (*)    Protein, ur 30 (*)    Nitrite POSITIVE (*)    Leukocytes, UA LARGE (*)    WBC, UA >50 (*)    Bacteria, UA FEW (*)    Non Squamous Epithelial 0-5 (*)    All other components within normal limits  I-STAT CG4 LACTIC ACID, ED - Abnormal; Notable for the following components:   Lactic Acid, Venous 2.05 (*)    All other components within normal limits  I-STAT CG4 LACTIC ACID, ED - Abnormal; Notable for the following components:   Lactic Acid, Venous 3.82 (*)    All other components within normal limits  CULTURE, BLOOD (ROUTINE X 2)  CULTURE, BLOOD (ROUTINE X 2)  URINE CULTURE  CBG MONITORING, ED  I-STAT TROPONIN, ED    EKG EKG Interpretation  Date/Time:  Wednesday July 18 2018 09:30:33 EST Ventricular Rate:  110 PR Interval:    QRS Duration: 74 QT Interval:  260 QTC Calculation: 352 R Axis:   10 Text Interpretation:  Atrial fibrillation Ventricular premature complex Repol abnrm suggests ischemia, diffuse leads Baseline wander in lead(s) I II III aVR aVF V1 V2 V3 V4 V5 V6 A fib with RVR faster than previous ST depression laterally Confirmed by Frederick Peers [phone]) on 07/18/2018 10:05:30 AM   Radiology Dg Chest 2 View  Result Date: 07/18/2018 CLINICAL DATA:  Altered mental status and tachycardia. EXAM: CHEST - 2 VIEW COMPARISON:  Chest x-ray dated June 10, 2018. FINDINGS: Stable cardiomediastinal silhouette. Persistent low lung volumes with bronchovascular crowding. Atherosclerotic calcification of the aortic arch. No focal consolidation, pleural effusion, or pneumothorax. No acute osseous abnormality. Chronic thoracolumbar compression deformities are unchanged. IMPRESSION: Persistent low lung volumes.  No active cardiopulmonary disease. Electronically Signed   By: Obie Dredge M.D.   On: 07/18/2018 11:27   Ct Head Wo Contrast  Result Date: 07/18/2018 CLINICAL DATA:   Dementia.  Mental status changes.  Right-sided gaze. EXAM: CT HEAD WITHOUT CONTRAST TECHNIQUE: Contiguous axial images were obtained from the base of the skull through the vertex without intravenous contrast. COMPARISON:  06/10/2018 FINDINGS: Brain: There is low-density affecting the lateral aspect of the right temporal lobe and the right temporoparietal junction region with swelling consistent with acute to subacute right MCA branch vessel infarction. No associated hemorrhage. Generalized brain atrophy elsewhere with extensive chronic small-vessel ischemic changes throughout the white matter. No hydrocephalus or extra-axial collection. Vascular: There is atherosclerotic calcification of the major vessels at the base of the brain. Skull: Normal Sinuses/Orbits: Clear/normal Other: None IMPRESSION: Acute/subacute infarction affecting right MCA branch vessel territory serving the right temporal lobe and temporoparietal junction region. Mild swelling but no hemorrhage. No significant mass effect. Extensive chronic small-vessel ischemic changes elsewhere throughout brain. Electronically Signed   By: Paulina Fusi M.D.   On: 07/18/2018 11:53    Procedures .Critical Care Performed by: Laurence Spates, MD Authorized by: Laurence Spates, MD   Critical care provider statement:    Critical care time (minutes):  30   Critical care time was exclusive of:  Separately billable procedures and treating other patients   Critical care was necessary to treat or prevent imminent or life-threatening deterioration of the following conditions:  CNS failure or compromise   Critical care was time spent personally by me on the following activities:  Development of treatment plan with patient or surrogate, discussions with consultants, evaluation of patient's  response to treatment, examination of patient, obtaining history from patient or surrogate, ordering and performing treatments and interventions, ordering and  review of laboratory studies, ordering and review of radiographic studies, re-evaluation of patient's condition and review of old charts   (including critical care time)  Medications Ordered in ED Medications  ceFEPIme (MAXIPIME) 2 g in sodium chloride 0.9 % 100 mL IVPB (has no administration in time range)  lactated ringers bolus 1,000 mL (has no administration in time range)  vancomycin (VANCOCIN) 1,500 mg in sodium chloride 0.9 % 500 mL IVPB (has no administration in time range)     Initial Impression / Assessment and Plan / ED Course  I have reviewed the triage vital signs and the nursing notes.  Pertinent labs & imaging results that were available during my care of the patient were reviewed by me and considered in my medical decision making (see chart for details).    Ill appearing on exam but protecting airway. Rightward gaze deviation. Neuro exam limited as patient unable to follow commands but she will move all extremities spontaneously. T 99.5, tachycardic. Lactate mildly elevated, given AMS and abnormal VS, initiated code sepsis with blood and urine cultures, vancomycin and cefepime, IV fluid bolus.  Differential is broad and includes infectious process, stroke, metabolic derangement.   Lab work shows UA consistent with infection.  Chest x-ray negative acute.  Head CT shows acute versus subacute right MCA infarct with no hemorrhage.  I suspect this is the cause of the patient's altered mentation.  Her last seen normal was last night therefore on arrival to ED she was outside of TPA window. Discussed w/ neurology, Dr. Otelia Limes, who will see pt in consultation. Discussed admission w/ hospitalist, Dr. Morrison Old, and pt admitted for further care.  Final Clinical Impressions(s) / ED Diagnoses   Final diagnoses:  Altered mental status, unspecified altered mental status type  Acute ischemic right MCA stroke [facility])    ED Discharge Orders    None       Little, Ambrose Finland,  MD 07/18/18 1308

## 2018-07-18 NOTE — Consult Note (Addendum)
Neurology Consultation  Reason for Consult: Stroke Referring Physician: Rennis Chris  CC: Left-sided weakness  History is obtained from: Chart  HPI: Connie Wagner is a 84 y.o. female with history of rheumatoid arthritis, migraines, ischemic cardiomyopathy, hypertension, dementia, depression, carotid stenosis, CAD, atrial fibrillation on aspirin who was brought to the emergency department from [place] secondary to altered mental status, lack of appetite and being nonverbal for the last 12 hours.  It was also noted by staff that patient had a right-sided gaze preference.  While in the ED she was found to have an acute/subacute infarction affecting the right MCA branch vessel territory serving the right temporal lobe and temporoparietal junction region.  There was mild swelling but no hemorrhage.  There was no significant mass-effect.  For this reason neurology was consulted to see patient.  Upon consultation patient was curled up in a fetal position.  With sternal rub she was arousable but resistant to requests and gentle attempts to help her to lay on her back.  She was wheezing and having decreased saturations. She was nonverbal and unable to follow commands. She appeared confused.  LKW: Not definitively known however at least 12 hours ago tpa given?: no, out of window Premorbid modified Rankin scale (mRS): 2 NIH stroke scale 16 ICH Score: 0  ROS:  Unable to obtain due to altered mental status.   Past Medical History:  Diagnosis Date  . Anemia    iron deficient  . Anxiety   . Atrial fibrillation (HCC)    per daughter  . CAD (coronary artery disease)    a. s/p Inf STEMI 2/14 => LHC 10/18/12: Diffuse distal LM 30%, LAD 50% calcified stenosis just after first septal perforator, diagonal branches with mild 20-30% stenosis, mid RCA 99%. PCI: Promus Premier DES to the mid RCA  . Carotid stenosis    a. LifeLine screening done in 2009 with carotid stenosis noted;  b.  Carotid dopplers 3/14: R  0-39%, L 40-59%, repeat 1 year  . Chronic lower back pain   . 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 Dementia (HCC)    needs redirection freq.  . Depression   . Hypertension   . Ischemic cardiomyopathy    a. Echocardiogram 10/18/12: Mild LVH, inferior and inferoseptal AK, posterior HK, EF 45%, grade 1 diastolic dysfunction, aortic sclerosis without stenosis, mild MR, mild LAE, RV systolic function moderately reduced (pacer wire in right ventricle), PASP 32  . Migraines    "haven't had one since going thru menopause"  . Rheumatoid arthritis(714.0)   . Upper GI bleeding 09/14/11   "have had it once before today"    Family History  Problem Relation Age of Onset  . Heart attack Mother   . Stroke Mother   . Hypertension Sister   . Anesthesia problems Neg Hx   . Hypotension Neg Hx   . Malignant hyperthermia Neg Hx   . Pseudochol deficiency Neg Hx     Social History:   reports that she has never smoked. She has never used smokeless tobacco. She reports that she drinks about 5.0 standard drinks of alcohol per week. She reports that she does not use drugs.  Medications  Current Facility-Administered Medications:  .   stroke: mapping our early stages of recovery book, , Does not apply, Once, Elyse Hsu, MD .  0.9 %  sodium chloride infusion, , Intravenous, Continuous, Elyse Hsu, MD .  acetaminophen (TYLENOL)  tablet 1,000 mg, 1,000 mg, Oral, Q8H PRN, Elyse Hsu, MD .  Melene Muller ON 07/19/2018] ceFEPIme (MAXIPIME) 2 g in sodium chloride 0.9 % 100 mL IVPB, 2 g, Intravenous, Q24H, Rumbarger, Rachel L, RPH .  digoxin (LANOXIN) tablet 0.0625 mg, 0.0625 mg, Oral, Daily, Elyse Hsu, MD .  divalproex (DEPAKOTE) DR tablet 250 mg, 250 mg, Oral, BID, Elyse Hsu, MD .  enoxaparin (LOVENOX) injection 30 mg, 30 mg, Subcutaneous, Q24H, Elyse Hsu, MD .  famotidine (PEPCID) 40 MG/5ML  suspension 40 mg, 40 mg, Oral, Daily, Elyse Hsu, MD .  polyethylene glycol (MIRALAX / GLYCOLAX) packet 17 g, 17 g, Oral, Daily, Elyse Hsu, MD .  QUEtiapine (SEROQUEL) tablet 25 mg, 25 mg, Oral, BID, Elyse Hsu, MD .  senna-docusate (Senokot-S) tablet 1 tablet, 1 tablet, Oral, QHS PRN, Elyse Hsu, MD .  vancomycin (VANCOCIN) 1,500 mg in sodium chloride 0.9 % 500 mL IVPB, 1,500 mg, Intravenous, Once, Rumbarger, Faye Ramsay, RPH, Last Rate: 250 mL/hr at 07/18/18 1211, 1,500 mg at 07/18/18 1211 .  [START ON 07/19/2018] vancomycin (VANCOCIN) IVPB 750 mg/150 ml premix, 750 mg, Intravenous, Q12H, Elyse Hsu, MD  Current Outpatient Medications:  .  acetaminophen (TYLENOL) 500 MG tablet, Take 1,000 mg by mouth every 8 (eight) hours as needed for mild pain., Disp: , Rfl:  .  digoxin (LANOXIN) 0.125 MG tablet, Take 1 tablet (125 mcg total) by mouth daily. (Patient taking differently: Take 0.0625 mcg by mouth daily. ), Disp: 30 tablet, Rfl: 11 .  divalproex (DEPAKOTE) 250 MG DR tablet, Take 250 mg by mouth 2 (two) times daily., Disp: , Rfl:  .  famotidine (PEPCID) 40 MG/5ML suspension, Take 40 mg by mouth daily., Disp: , Rfl:  .  ferrous sulfate 325 (65 FE) MG tablet, Take 325 mg by mouth daily with breakfast., Disp: , Rfl:  .  metoprolol tartrate (LOPRESSOR) 50 MG tablet, Take 1.5 tablets (75 mg total) by mouth 2 (two) times daily. (Patient taking differently: Take 50 mg by mouth 2 (two) times daily. ), Disp: 60 tablet, Rfl: 11 .  polyethylene glycol (MIRALAX / GLYCOLAX) packet, Take 17 g by mouth daily., Disp: 14 each, Rfl: 0 .  QUEtiapine (SEROQUEL) 25 MG tablet, Take 1 tablet (25 mg total) 2 (two) times daily by mouth., Disp: 60 tablet, Rfl: 0 .  acetaminophen (TYLENOL) 325 MG tablet, Take 2 tablets (650 mg total) every 6 (six) hours as needed by mouth for mild pain or fever. (Patient not taking: Reported on 07/18/2018), Disp: 30 tablet, Rfl: 0 .  aspirin EC 81 MG EC tablet,  Take 1 tablet (81 mg total) daily by mouth. (Patient not taking: Reported on 06/10/2018), Disp: 30 tablet, Rfl: 0 .  benzonatate (TESSALON) 200 MG capsule, Take 1 capsule (200 mg total) by mouth 3 (three) times daily as needed for cough. (Patient not taking: Reported on 03/16/2018), Disp: 20 capsule, Rfl: 0 .  guaiFENesin (MUCINEX) 600 MG 12 hr tablet, Take 1 tablet (600 mg total) by mouth 2 (two) times daily. (Patient not taking: Reported on 03/16/2018), Disp: 15 tablet, Rfl: 0 .  pantoprazole (PROTONIX) 40 MG tablet, Take 1 tablet (40 mg total) daily by mouth. (Patient not taking: Reported on 07/18/2018), Disp: 30 tablet, Rfl: 0 .  pravastatin (PRAVACHOL) 40 MG tablet, Take 1 tablet (40 mg total) daily at 6 PM by mouth. (Patient not taking: Reported on 06/10/2018), Disp: 30 tablet, Rfl: 0  Facility-Administered Medications Ordered in Other  Encounters:  .  bupivacaine (MARCAINE) 0.5 % injection, , , PRN, Sheldon Silvan, MD, 20 mL at 08/17/12 1922   Exam: Current vital signs: BP (!) 136/100   Pulse 97   Temp 99.5 F (37.5 C) (Rectal)   Resp 19   Ht 5\' 5"  (1.651 m)   Wt 68 kg   SpO2 (!) 87%   BMI 24.96 kg/m  Vital signs in last 24 hours: Temp:  [99.5 F (37.5 C)] 99.5 F (37.5 C) (11/20 0944) Pulse Rate:  [78-148] 97 (11/20 1300) Resp:  [19-32] 19 (11/20 1300) BP: (109-151)/(61-123) 136/100 (11/20 1300) SpO2:  [87 %-100 %] 87 % (11/20 1300) Weight:  [68 kg] 68 kg (11/20 1224)  Physical Exam  Constitutional: Cachectic.  Psych: Able to test Eyes: No scleral injection HENT: No OP obstrucion Head: Normocephalic.  Cardiovascular: Normal rate and regular rhythm.  Respiratory: Gross wheezing with intermittent tachypnea GI: Soft.  No distension. There is no tenderness.  Skin: WDI  Neuro: Mental Status: Dense receptive and expressive aphasia. Patient has no responses to verbal inputs.  Eyes open to noxious stimuli, does not follow commands. Agitated. Cranial Nerves: II: No blink to  threat bilaterally. PERRL III,IV, VI: Right gaze devation is persistent; eyes do not cross midline when head is turned with doll's maneuver.  V: Grimaces with brow ridge pressure bilaterally VII: Left facial droop VIII: No response to voice Motor/sensory: Withdraws from noxious stimuli in all 4 extremities, less briskly on the left. Patient does localize to noxious stimuli with sternal rub but does so more briskly with her RUE than her left.  Patient is noted to have increased tone in her lower extremities, which will withdraw and thrash to plantar stimulation, with no definite asymmetry. Deep Tendon Reflexes: 2+ in the upper extremities with no knee jerks or ankle jerks. Plantars: Downgoing bilaterally Cerebellar: Unable to examine  Labs I have reviewed labs in epic and the results pertinent to this consultation are:   CBC    Component Value Date/Time   WBC 9.3 07/18/2018 1018   RBC 4.16 07/18/2018 1018   HGB 11.5 (L) 07/18/2018 1018   HCT 39.4 07/18/2018 1018   PLT 199 07/18/2018 1018   MCV 94.7 07/18/2018 1018   MCH 27.6 07/18/2018 1018   MCHC 29.2 (L) 07/18/2018 1018   RDW 17.3 (H) 07/18/2018 1018   LYMPHSABS 0.8 07/18/2018 1018   MONOABS 0.7 07/18/2018 1018   EOSABS 0.1 07/18/2018 1018   BASOSABS 0.0 07/18/2018 1018    CMP     Component Value Date/Time   NA 139 07/18/2018 1018   K 4.7 07/18/2018 1018   CL 106 07/18/2018 1018   CO2 23 07/18/2018 1018   GLUCOSE 100 (H) 07/18/2018 1018   BUN 12 07/18/2018 1018   CREATININE 0.96 07/18/2018 1018   CALCIUM 8.6 (L) 07/18/2018 1018   PROT 7.7 07/18/2018 1018   ALBUMIN 2.8 (L) 07/18/2018 1018   AST 29 07/18/2018 1018   ALT 12 07/18/2018 1018   ALKPHOS 54 07/18/2018 1018   BILITOT 1.3 (H) 07/18/2018 1018   GFRNONAA 52 (L) 07/18/2018 1018   GFRAA 60 (L) 07/18/2018 1018    Lipid Panel     Component Value Date/Time   CHOL 225 (H) 07/05/2017 0304   TRIG 85 07/05/2017 0304   HDL 50 07/05/2017 0304   CHOLHDL 4.5  07/05/2017 0304   VLDL 17 07/05/2017 0304   LDLCALC 158 (H) 07/05/2017 0304     Imaging I have reviewed the  images obtained:  CT-scan of the brain- acute/subacute infarction affecting the right MCA branch vessel territory serving the right temporal lobe and temporoparietal junction   Connie Morn PA-C [facility] [phone] 07/18/2018, 1:52 PM     Assessment:  84 year old female with a history of atrial fibrillation, presenting to the hospital with altered mental status and CT showing a medium to large-sized right MCA branch temporal lobe and temporal-parietal lobe junction infarct.   1. Atrial fibrillation. Patient is on aspirin, however not on anticoagulants.   2. Patient with a history of stroke causing right-sided weakness 1 year ago. 3. Dementia  Recommendations: # MRI of the brain without contrast # MRA Head and neck  # Transthoracic Echo,   # Carotid ultrasound # Increase ASA to 325 mg daily # Start or continue Atorvastatin 80 mg/other high intensity statin # BP goal: permissive HTN upto 220/120 mmHg # HBAIC and Lipid profile # Telemetry monitoring # Frequent neuro checks # NPO until passes stroke swallow screen # BP management # please page stroke NP  Or  PA  Or MD from 8am -4 pm  as this patient from this time will be  followed by the stroke.   You can look them up on [email or web address]  Password [email or web address]  I have seen and examined the patient. I have amended the assessment and recommendations above.  Electronically signed: Dr. Caryl Pina

## 2018-07-18 NOTE — ED Triage Notes (Signed)
Per [facility] pt coming from [place] at [facility] for altered mental status, lack of appetite and being non verbal x 12 hours. According to staff pt will sometimes speak randomly however baseline of dementia and disorientated x 4. Patient has right sided gaze. Equal grip strength.

## 2018-07-19 ENCOUNTER — Inpatient Hospital Stay (HOSPITAL_COMMUNITY): Payer: Medicare HMO

## 2018-07-19 DIAGNOSIS — Z515 Encounter for palliative care: Secondary | ICD-10-CM

## 2018-07-19 DIAGNOSIS — Z7189 Other specified counseling: Secondary | ICD-10-CM

## 2018-07-19 DIAGNOSIS — I63511 Cerebral infarction due to unspecified occlusion or stenosis of right middle cerebral artery: Secondary | ICD-10-CM

## 2018-07-19 DIAGNOSIS — I639 Cerebral infarction, unspecified: Secondary | ICD-10-CM

## 2018-07-19 DIAGNOSIS — I10 Essential (primary) hypertension: Secondary | ICD-10-CM

## 2018-07-19 DIAGNOSIS — F0150 Vascular dementia without behavioral disturbance: Secondary | ICD-10-CM

## 2018-07-19 DIAGNOSIS — I4891 Unspecified atrial fibrillation: Secondary | ICD-10-CM

## 2018-07-19 LAB — LIPID PANEL
Cholesterol: 177 mg/dL (ref 0–200)
HDL: 34 mg/dL — ABNORMAL LOW (ref >40)
LDL Cholesterol: 125 mg/dL — ABNORMAL HIGH (ref 0–99)
Total CHOL/HDL Ratio: 5.2 RATIO
Triglycerides: 92 mg/dL (ref <150)
VLDL: 18 mg/dL (ref 0–40)

## 2018-07-19 LAB — COMPREHENSIVE METABOLIC PANEL
ALT: 10 U/L (ref 0–44)
AST: 26 U/L (ref 15–41)
Albumin: 2.4 g/dL — ABNORMAL LOW (ref 3.5–5.0)
Alkaline Phosphatase: 49 U/L (ref 38–126)
Anion gap: 7 (ref 5–15)
BUN: 14 mg/dL (ref 8–23)
CO2: 22 mmol/L (ref 22–32)
Calcium: 8.2 mg/dL — ABNORMAL LOW (ref 8.9–10.3)
Chloride: 110 mmol/L (ref 98–111)
Creatinine, Ser: 0.99 mg/dL (ref 0.44–1.00)
GFR calc Af Amer: 58 mL/min — ABNORMAL LOW (ref >60)
GFR calc non Af Amer: 50 mL/min — ABNORMAL LOW (ref >60)
Glucose, Bld: 130 mg/dL — ABNORMAL HIGH (ref 70–99)
Potassium: 3.9 mmol/L (ref 3.5–5.1)
Sodium: 139 mmol/L (ref 135–145)
Total Bilirubin: 1 mg/dL (ref 0.3–1.2)
Total Protein: 7.1 g/dL (ref 6.5–8.1)

## 2018-07-19 LAB — CBC
HCT: 35 % — ABNORMAL LOW (ref 36.0–46.0)
Hemoglobin: 10.5 g/dL — ABNORMAL LOW (ref 12.0–15.0)
MCH: 28.3 pg (ref 26.0–34.0)
MCHC: 30 g/dL (ref 30.0–36.0)
MCV: 94.3 fL (ref 80.0–100.0)
Platelets: 152 10*3/uL (ref 150–400)
RBC: 3.71 MIL/uL — ABNORMAL LOW (ref 3.87–5.11)
RDW: 17.2 % — ABNORMAL HIGH (ref 11.5–15.5)
WBC: 10.8 10*3/uL — ABNORMAL HIGH (ref 4.0–10.5)
nRBC: 0 % (ref 0.0–0.2)

## 2018-07-19 LAB — URINE CULTURE: Special Requests: NORMAL

## 2018-07-19 LAB — HEMOGLOBIN A1C
Hgb A1c MFr Bld: 5.3 % (ref 4.8–5.6)
Mean Plasma Glucose: 105.41 mg/dL

## 2018-07-19 MED ORDER — BIOTENE DRY MOUTH MT LIQD: 15.0000 mL | OROMUCOSAL | Status: DC | PRN

## 2018-07-19 MED ORDER — GLYCOPYRROLATE 0.2 MG/ML IJ SOLN: 0.2000 mg | INTRAMUSCULAR | Status: DC | PRN

## 2018-07-19 MED ORDER — ONDANSETRON 4 MG PO TBDP: 4.0000 mg | ORAL_TABLET | Freq: Four times a day (QID) | ORAL | Status: DC | PRN

## 2018-07-19 MED ORDER — HALOPERIDOL 0.5 MG PO TABS
0.5000 mg | ORAL_TABLET | ORAL | Status: DC | PRN
  Filled 2018-07-19: qty 1

## 2018-07-19 MED ORDER — ONDANSETRON HCL 4 MG/2ML IJ SOLN: 4.0000 mg | Freq: Four times a day (QID) | INTRAMUSCULAR | Status: DC | PRN

## 2018-07-19 MED ORDER — POLYVINYL ALCOHOL 1.4 % OP SOLN
1.0000 [drp] | Freq: Four times a day (QID) | OPHTHALMIC | Status: DC | PRN
  Filled 2018-07-19: qty 15

## 2018-07-19 MED ORDER — SODIUM CHLORIDE 0.9 % IV SOLN: 250.0000 mL | INTRAVENOUS | Status: DC | PRN

## 2018-07-19 MED ORDER — SODIUM CHLORIDE 0.9% FLUSH: 3.0000 mL | INTRAVENOUS | Status: DC | PRN

## 2018-07-19 MED ORDER — SODIUM CHLORIDE 0.9 % IV SOLN
1.0000 g | INTRAVENOUS | Status: DC
  Administered 2018-07-19: 1 g via INTRAVENOUS
  Filled 2018-07-19: qty 10

## 2018-07-19 MED ORDER — GLYCOPYRROLATE 1 MG PO TABS
1.0000 mg | ORAL_TABLET | ORAL | Status: DC | PRN
  Filled 2018-07-19: qty 1

## 2018-07-19 MED ORDER — HALOPERIDOL LACTATE 5 MG/ML IJ SOLN: 0.5000 mg | INTRAMUSCULAR | Status: DC | PRN

## 2018-07-19 MED ORDER — SODIUM CHLORIDE 0.9% FLUSH: 3.0000 mL | Freq: Two times a day (BID) | INTRAVENOUS | Status: DC

## 2018-07-19 MED ORDER — HALOPERIDOL LACTATE 2 MG/ML PO CONC: 0.5000 mg | ORAL | Status: DC | PRN

## 2018-07-19 NOTE — Progress Notes (Addendum)
[facility] hospital liaison note. ° °Received request from Liz Paisley, CSW for family interest in [facility] with request for transfer today. Chart reviewed and eligibility confirmed. Met with daughter, Lisa to confirm interest and explain services. Family agreeable to transfer today. CSW aware.  Registration paper work completed. Dr. Stoneking to assume care per family request.  °Please fax discharge summary to [phone]. RN please call report to [phone]. Please arrange transport for patient. ° °Mary Anne Robertson, RN, CCM  °[facility] Hospital Liaison  °[phone] ° °[facility] Hospital Liasions are on AMION °  °

## 2018-07-19 NOTE — Progress Notes (Signed)
SLP Cancellation Note  Patient Details Name: ATLEY SCARBORO MRN: [id number] DOB: [birth year]   Cancelled treatment:        Bedside Swallow Evaluation completed at this time.  Also received orders for cognitive-linguistic evaluation.  Pt is not appropriate for cognitive evaluation at this time due to anxiety and limited participation with therapist during swallow evaluation.  Also is pending meeting with palliative medicine team and family to determine goals of care.  Will follow along for dysphagia and will complete cognitive-linguistic evaluation per family's wishes following PMT meeting and if/when pt becomes more lucid and participatory.     Gilma Bessette, Melanee Spry 07/19/2018, 10:35 AM

## 2018-07-19 NOTE — Progress Notes (Signed)
PMT meeting scheduled with Dtr at 11:00 today.  Norvel Richards, PA-C Palliative Medicine Pager: [phone]

## 2018-07-19 NOTE — Consult Note (Signed)
Consultation Note Date: 07/19/2018   Patient Name: Connie Wagner  DOB: [birth year]  MRN: [id number]  Age / Sex: 84 y.o., female  PCP: Lajean Manes, MD Referring Physician: Lavina Hamman, MD  Reason for Consultation: Establishing goals of care and Psychosocial/spiritual support  HPI/Patient Profile: 84 y.o. female  with past medical history of CVA with residual right sided weakness, RA, dementia, UGIB, cardiomyopathy who was admitted on 07/18/2018 with moderate to large right MCA stroke.   Clinical Assessment and Goals of Care:  I have reviewed medical records including EPIC notes, labs and imaging, received report from the care team, assessed the patient and then met at the bedside along with her daughter Reather Laurence to discuss diagnosis prognosis, [place], EOL wishes, disposition and options.  I introduced Palliative Medicine as specialized medical care for people living with serious illness. It focuses on providing relief from the symptoms and stress of a serious illness. The goal is to improve quality of life for both the patient and the family.  We discussed a brief life review of the patient. 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  She has 1 daughter.  She had a stroke in Feb/March of this year but was still mobile afterwards.  She recognized her daughter on some days.  [name] felt her mother's quality of life was poor.  Her mother told her approximately 1 year ago "you know I'm dying".  [name] now realizes that despite her dementia her mother was very wise.  She also told me her mother always said she would die of a stroke - they run in the family.  We discussed their current illness and what it means in the larger context of their on-going co-morbidities.  Natural disease trajectory and expectations at EOL were discussed.  I attempted to elicit values and goals of care important to the patient.  We  discussed her expressive and receptive aphasia, her heart failure, and her new bed bound status.  I expressed to [name] that my biggest fear for her mother is an inability to eat or drink enough to sustain life.   [name] does not support artificial nutrition even on a temporary basis - her mother would not want it.  The difference between aggressive medical intervention and comfort care was considered in light of the patient's goals of care.  Advanced directives, concepts specific to code status, artifical feeding and hydration, and rehospitalization were considered and discussed. Hospice and Palliative Care services outpatient were explained and offered.  [name] would like for her mother to go to [facility] for EOL care.  [facility] is most convenient, but [name] works in [employer] so she has some flexibility.  [name] had a friend at [facility] in the past.  She spoke of it as a loving special place   Questions and concerns were addressed.  The family was encouraged to call with questions or concerns.    Primary Decision Maker:  NEXT OF KIN only daughter Reather Laurence.  SUMMARY OF RECOMMENDATIONS    Shift to full comfort.  D/C interventions that do not provide comfort.  Will start PRN comfort meds. Social work order placed for [employer] referral. PMT will continue to follow with you.  Code Status/Advance Care Planning: DNR  Symptom Management:   PRN morphine, haldol, robinul.  Will avoid benzodiazepine due to dementia and concern for paradoxical reaction.  If the patient can swallow - would continue depakote and seroquel from her PTA medications.   Palliative Prophylaxis:   Aspiration precautions with comfort feeds.  Psycho-social/Spiritual:   Desire for further Chaplaincy support: yes  Prognosis: less than two weeks.  Right MCA CVA, bed bound, not eating/drinking, with both receptive and expressive aphasia.    Discharge Planning: Hospice facility      Primary  Diagnoses: Present on Admission: . HTN (hypertension) . Dementia ([place]) . Coronary artery disease . Atrial fibrillation with RVR ([place]) . Acute lower UTI   I have reviewed the medical record, interviewed the patient and family, and examined the patient. The following aspects are pertinent.  Past Medical History:  Diagnosis Date  . Anemia    iron deficient  . Anxiety   . Atrial fibrillation ([place])    per daughter  . CAD (coronary artery disease)    a. s/p Inf STEMI 2/14 => LHC 10/18/12: Diffuse distal LM 30%, LAD 50% calcified stenosis just after first septal perforator, diagonal branches with mild 20-30% stenosis, mid RCA 99%. PCI: Promus Premier DES to the mid RCA  . Carotid stenosis    a. LifeLine screening done in 2009 with carotid stenosis noted;  b.  Carotid dopplers 3/14: R 0-39%, L 40-59%, repeat 1 year  . Chronic lower back pain   . 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 Dementia ([place])    needs redirection freq.  . Depression   . Hypertension   . Ischemic cardiomyopathy    a. Echocardiogram 10/18/12: Mild LVH, inferior and inferoseptal AK, posterior HK, EF 40%, grade 1 diastolic dysfunction, aortic sclerosis without stenosis, mild MR, mild LAE, RV systolic function moderately reduced (pacer wire in right ventricle), PASP 32  . Migraines    "haven't had one since going thru menopause"  . Rheumatoid arthritis(714.0)   . Upper GI bleeding 09/14/11   "have had it once before today"   Social History   Socioeconomic History  . Marital status: Widowed    Spouse name: Not on file  . Number of children: Not on file  . Years of education: Not on file  . Highest education level: Not on file  Occupational History  . Occupation: Retired Marine scientist    Comment: 76 years  Social Needs  . Financial resource strain: Not on file  . Food insecurity:    Worry: Not on file    Inability: Not on file  .  Transportation needs:    Medical: Not on file    Non-medical: Not on file  Tobacco Use  . Smoking status: Never Smoker  . Smokeless tobacco: Never Used  Substance and Sexual Activity  . Alcohol use: Yes    Alcohol/week: 5.0 standard drinks    Types: 5 Glasses of wine per week  . Drug use: No  . Sexual activity: Never  Lifestyle  . Physical activity:    Days per week: Not on file    Minutes per session: Not on file  . Stress: Not on file  Relationships  .  Social connections:    Talks on phone: Not on file    Gets together: Not on file    Attends religious service: Not on file    Active member of club or organization: Not on file    Attends meetings of clubs or organizations: Not on file    Relationship status: Not on file  Other Topics Concern  . Not on file  Social History Narrative   Daughter and Emergency Contact: Reather Laurence   Cell [phone]   Home [phone]   Family History  Problem Relation Age of Onset  . Heart attack Mother   . Stroke Mother   . Hypertension Sister   . Anesthesia problems Neg Hx   . Hypotension Neg Hx   . Malignant hyperthermia Neg Hx   . Pseudochol deficiency Neg Hx    Scheduled Meds: . aspirin  325 mg Oral Daily  . chlorhexidine  15 mL Mouth Rinse BID  . digoxin  0.0625 mg Intravenous Daily  . enoxaparin (LOVENOX) injection  30 mg Subcutaneous Q24H  . famotidine  40 mg Oral Daily  . mouth rinse  15 mL Mouth Rinse q12n4p  . polyethylene glycol  17 g Oral Daily  . pravastatin  80 mg Oral q1800  . QUEtiapine  25 mg Oral BID  . sodium chloride flush  3 mL Intravenous Q12H   Continuous Infusions: . sodium chloride    . cefTRIAXone (ROCEPHIN)  IV    . valproate sodium 250 mg (07/19/18 1103)   PRN Meds:.sodium chloride, acetaminophen, antiseptic oral rinse, glycopyrrolate **OR** glycopyrrolate **OR** glycopyrrolate, haloperidol **OR** haloperidol **OR** haloperidol lactate, ipratropium-albuterol, ondansetron **OR** ondansetron  (ZOFRAN) IV, polyvinyl alcohol, senna-docusate, sodium chloride flush Allergies  Allergen Reactions  . Amoxicillin-Pot Clavulanate Other (See Comments)    "Allergic," per MAR  . Codeine Other (See Comments)    "Allergic," per MAR  . Escitalopram Oxalate Other (See Comments)    "Allergic," per MAR  . Iron Nausea And Vomiting and Other (See Comments)    Loose stools, also  . Losartan Potassium Other (See Comments)    "Allergic," per MAR  . Metronidazole Other (See Comments)    "Allergic," per MAR  . Raloxifene Hcl Other (See Comments)    "Allergic," per MAR  . Tetracycline Hcl Other (See Comments)    "Allergic," per MAR  . Penicillins Rash    Has patient had a PCN reaction causing immediate rash, facial/tongue/throat swelling, SOB or lightheadedness with hypotension: Yes Has patient had a PCN reaction causing severe rash involving mucus membranes or skin necrosis: No Has patient had a PCN reaction that required hospitalization No Has patient had a PCN reaction occurring within the last 10 years: No If all of the above answers are "NO", then may proceed with Cephalosporin use.   . Raloxifene Rash  . Sulfa Drugs Cross Reactors Rash   Review of Systems patient non-verbal  Physical Exam  Well developed female, elderly, non-verbal.  Opens her eyes. CV no m/r/g resp no distress on 4L Abdomen soft, nt, nd Ext moves LLE  Vital Signs: BP 114/66 (BP Location: Right Arm)   Pulse 91   Temp 97.9 F (36.6 C) (Axillary)   Resp 19   Ht 5' 5"  (1.651 m)   Wt 68 kg   SpO2 99%   BMI 24.96 kg/m  Pain Scale: PAINAD       SpO2: SpO2: 99 % O2 Device:SpO2: 99 % O2 Flow Rate: .O2 Flow Rate (L/min): 4 L/min  IO: Intake/output summary:   Intake/Output Summary (Last 24 hours) at 07/19/2018 1217 Last data filed at 07/19/2018 1599 Gross per 24 hour  Intake 2930.33 ml  Output 400 ml  Net 2530.33 ml    LBM: Last BM Date: 07/18/18 Baseline Weight: Weight: 68 kg Most recent weight:  Weight: 68 kg     Palliative Assessment/Data: 10%     Time In: 11:00 Time Out: 11:30 Time Total: 30 min Greater than 50%  of this time was spent counseling and coordinating care related to the above assessment and plan.  Signed by: Florentina Jenny, PA-C Palliative Medicine Pager: [phone]  Please contact Palliative Medicine Team phone at [phone] for questions and concerns.  For individual provider: See Shea Evans

## 2018-07-19 NOTE — Progress Notes (Signed)
VASCULAR LAB PRELIMINARY  PRELIMINARY  PRELIMINARY  PRELIMINARY  Carotid duplex completed.    Preliminary report:  1-39% right ICA stenosis.  Vessel is extremely tortuous throughout. Right vertebral artery flow is antegrade.  Unable to insonate left ICA secondary to patients AMS and inability to turn head.  Connie Wagner, RVT 07/19/2018, 11:00 AM

## 2018-07-19 NOTE — Progress Notes (Signed)
Discharge to: [facility] Discharge date: 07/19/18 Family notified: Yes, daughter, by phone Transport by: Eddie Dibbles Corinn Stoltzfus, LCSWA [facility]

## 2018-07-19 NOTE — Progress Notes (Signed)
OT Cancellation Note  Patient Details Name: Connie Wagner MRN: [id number] DOB: 02/25/1929   Cancelled Treatment:    Reason Eval/Treat Not Completed: Active bedrest order;Other (comment) Orders received and chart reviewed as well as discussion with RN. Pt is currently on bedrest orders and has palliative consult scheduled for consideration of comfort measures. Will hold acute OT assessment at this time.  Mariam Dollar Beth Dixon, OTR/L 07/19/2018, 8:44 AM

## 2018-07-19 NOTE — Clinical Social Work Note (Signed)
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  Yes Need for family participation in patient care:  Yes (Comment)  Care giving concerns:  Patient is now at end of life, family would prefer residential hospice placement.   Social Worker assessment / plan:  CSW alerted by MD that patient is now for comfort care. CSW contacted patient's daughter and spoke with her via phone about referral to residential hospice. Patient's daughter would prefer [facility]; if there's absolutely no availability, then she's ok with looking into [place] as a very last resort, but it's not very convenient to where she's at. CSW sent referral to [facility] and will continue to follow.  Employment status:  Retired Database administrator PT Recommendations:  Not assessed at this time Information / Referral to community resources:     Patient/Family's Response to care:  Patient's daughter agreeable with comfort care and prefers transfer to [facility] for hospice.  Patient/Family's Understanding of and Emotional Response to Diagnosis, Current Treatment, and Prognosis:  Patient's daughter is at  peace with decision to move forward with hospice placement, wants her mother to be comfortable. Patient's daughter appreciative of care received at hospital and of CSW assistance in coordinating care.  Emotional Assessment Appearance:  Appears stated age Attitude/Demeanor/Rapport:  Unable to Assess Affect (typically observed):  Unable to Assess Orientation:    Alcohol / Substance use:  Not Applicable Psych involvement (Current and /or in the community):  No (Comment)  Discharge Needs  Concerns to be addressed:  Care Coordination Readmission within the last 30 days:  No Current discharge risk:  Terminally ill Barriers to Discharge:  Continued Medical Work up   [employer], LCSW 07/19/2018, 2:43 PM

## 2018-07-19 NOTE — Evaluation (Signed)
Clinical/Bedside Swallow Evaluation Patient Details  Name: Connie Wagner MRN: [id number] Date of Birth: 1928-09-14  Today's Date: 07/19/2018 Time: SLP Start Time (ACUTE ONLY): 1000 SLP Stop Time (ACUTE ONLY): 1011 SLP Time Calculation (min) (ACUTE ONLY): 11 min  Past Medical History:  Past Medical History:  Diagnosis Date  . Anemia    iron deficient  . Anxiety   . Atrial fibrillation (HCC)    per daughter  . CAD (coronary artery disease)    a. s/p Inf STEMI 2/14 => LHC 10/18/12: Diffuse distal LM 30%, LAD 50% calcified stenosis just after first septal perforator, diagonal branches with mild 20-30% stenosis, mid RCA 99%. PCI: Promus Premier DES to the mid RCA  . Carotid stenosis    a. [employer] screening done in 2009 with carotid stenosis noted;  b.  Carotid dopplers 3/14: R 0-39%, L 40-59%, repeat 1 year  . Chronic lower back pain   . 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 Dementia (HCC)    needs redirection freq.  . Depression   . Hypertension   . Ischemic cardiomyopathy    a. Echocardiogram 10/18/12: Mild LVH, inferior and inferoseptal AK, posterior HK, EF 45%, grade 1 diastolic dysfunction, aortic sclerosis without stenosis, mild MR, mild LAE, RV systolic function moderately reduced (pacer wire in right ventricle), PASP 32  . Migraines    "haven't had one since going thru menopause"  . Rheumatoid arthritis(714.0)   . Upper GI bleeding 09/14/11   "have had it once before today"   Past Surgical History:  Past Surgical History:  Procedure Laterality Date  . BACK SURGERY    . BREAST LUMPECTOMY     "benign tumor removed"  . ESOPHAGOGASTRODUODENOSCOPY  09/15/2011   Procedure: ESOPHAGOGASTRODUODENOSCOPY (EGD);  Surgeon: Barrie Folk, MD;  Location[facility] ENDOSCOPY;  Service: Endoscopy;  Laterality: N/A;  . HARVEST BONE GRAFT  08/17/2012   Procedure: HARVEST ILIAC BONE GRAFT;  Surgeon: Eulas Post,  MD;  Location: [facility] OR;  Service: Orthopedics;  Laterality: Right;  . HEMATOMA EVACUATION  10/2002   lumbar  . INCISION AND DRAINAGE OF WOUND  10/2002   lumbar wound infection  . JOINT REPLACEMENT    . KNEE ARTHROSCOPY  01/2001   right  . LEFT HEART CATHETERIZATION WITH CORONARY ANGIOGRAM N/A 10/18/2012   Procedure: LEFT HEART CATHETERIZATION WITH CORONARY ANGIOGRAM;  Surgeon: Tonny Bollman, MD;  Location: [facility] CATH LAB;  Service: Cardiovascular;  Laterality: N/A;  . LUMBAR DISC SURGERY  09/2002   Hemilaminectomy, L3, complete laminectomy, L4, hemi- semilaminectomy, L5 and right S1, and diskectomy, L5-S1.  Marland Kitchen LUMBAR LAMINECTOMY  10/2003   for epidural abscess evacuation; L4-5  . ORIF HUMERUS FRACTURE  08/17/2012   Procedure: OPEN REDUCTION INTERNAL FIXATION (ORIF) DISTAL HUMERUS FRACTURE;  Surgeon: Eulas Post, MD;  Location: [facility] OR;  Service: Orthopedics;  Laterality: Right;  ORIF RIGHT SUPRACHONDULAR HUMERUS FRACTURE  . TOTAL KNEE ARTHROPLASTY  01/2002   right   HPI:  84 y.o. female with history of rheumatoid arthritis, migraines, ischemic cardiomyopathy, hypertension, dementia, depression, carotid stenosis, CAD, atrial fibrillation on aspirin who was brought to the emergency department from [place] secondary to altered mental status, lack of appetite and being nonverbal for the last 12 hours.  It was also noted by staff that patient had a right-sided gaze preference.  While in the ED she was found to have an acute/subacute infarction affecting the right MCA branch vessel territory  serving the right temporal lobe and temporoparietal junction region.    Assessment / Plan / Recommendation Clinical Impression   Today's evaluation was limited by pt's refusal of any POs offered.  Pt was awake but appeared anxious and yelled out any time therapist made attempts to reposition her in bed or touch her.  Pt did relax slightly with a warm washcloth applied to the face, enough so that therapist could  complete basic oral care.  Pt has halitosis and brief views of oral cavity revealed poor condition of teeth and mucosa.  With attempts to provide boluses, pt would purse her lips and turn her head away regardless of presentation (straw, siphon, or spoon).  As a result, I can not make a safe diet recommendation or means of medication administration at this time due to cognitive deficits.  Previous records indicate that pt was last seen by speech for dysphagia earlier this year with recommendations for dys 2, thin liquids due to cognitively based swallowing impairments.  SLP is hopeful that pt will be able to resume diet if mentation improves; however, given pt's hx of dementia and now with acute infarct prognosis is guarded overall.  Meeting with palliative medicine team is scheduled at 11 to determine goals of care and therapy will follow along per family's wishes and medical team's recommendations.   SLP Visit Diagnosis: Dysphagia, unspecified (R13.10)    Aspiration Risk  Severe aspiration risk;Risk for inadequate nutrition/hydration    Diet Recommendation NPO        Other  Recommendations Oral Care Recommendations: Oral care QID   Follow up Recommendations Skilled Nursing facility      Frequency and Duration min 2x/week          Prognosis Prognosis for Safe Diet Advancement: Guarded Barriers to Reach Goals: Severity of deficits;Behavior;Cognitive deficits      Swallow Study   General HPI: 84 y.o. female with history of rheumatoid arthritis, migraines, ischemic cardiomyopathy, hypertension, dementia, depression, carotid stenosis, CAD, atrial fibrillation on aspirin who was brought to the emergency department from [place] secondary to altered mental status, lack of appetite and being nonverbal for the last 12 hours.  It was also noted by staff that patient had a right-sided gaze preference.  While in the ED she was found to have an acute/subacute infarction affecting the right MCA  branch vessel territory serving the right temporal lobe and temporoparietal junction region.  Type of Study: Bedside Swallow Evaluation Previous Swallow Assessment: 09/2017 Diet Prior to this Study: NPO Temperature Spikes Noted: Yes Respiratory Status: Nasal cannula History of Recent Intubation: No Behavior/Cognition: Lethargic/Drowsy;Doesn't follow directions;Confused Oral Care Completed by SLP: Yes Oral Cavity - Dentition: Poor condition;Adequate natural dentition Vision: (difficult to assess) Self-Feeding Abilities: Total assist Patient Positioning: Upright in bed Baseline Vocal Quality: Other (comment)(nonverbal) Volitional Cough: Cognitively unable to elicit Volitional Swallow: Unable to elicit    Oral/Motor/Sensory Function Overall Oral Motor/Sensory Function: Other (comment)(difficult to assess)   Ice Chips     Thin Liquid Other Comments: pt would not accept boluses, kept lips pursed    Nectar Thick     Honey Thick     Puree     Solid            Meleny Tregoning, Joni Reining L 07/19/2018,10:21 AM

## 2018-07-19 NOTE — Progress Notes (Signed)
PT Cancellation Note  Patient Details Name: Connie Wagner MRN: [id number] DOB: [birth year]   Cancelled Treatment:    Reason Eval/Treat Not Completed: Active bedrest order. Pt with bed rest orders in place. PT will continue to follow and await updated activity orders.   Alessandra Bevels Fredrick Dray 07/19/2018, 8:27 AM

## 2018-07-19 NOTE — Progress Notes (Addendum)
STROKE TEAM PROGRESS NOTE   INTERVAL HISTORY No one at bedside. Patient in bed, asleep, grunt to touch, aroused to sternal rub,  mouth breathing. 3.5 L [place], NAD.   Vitals:   07/19/18 0319 07/19/18 0344 07/19/18 0444 07/19/18 0801  BP: 105/72 107/63 117/61 114/66  Pulse: 89 85 98 91  Resp: 20 18 19 19   Temp: 97.8 F (36.6 C) 97.9 F (36.6 C) 97.8 F (36.6 C) 97.9 F (36.6 C)  TempSrc: Oral Axillary Axillary Axillary  SpO2: 100% 100% 100% 99%  Weight:      Height:        CBC:  Recent Labs  Lab 07/18/18 1018 07/19/18 0412  WBC 9.3 10.8*  NEUTROABS 7.7  --   HGB 11.5* 10.5*  HCT 39.4 35.0*  MCV 94.7 94.3  PLT 199 152    Basic Metabolic Panel:  Recent Labs  Lab 07/18/18 1018 07/19/18 0412  NA 139 139  K 4.7 3.9  CL 106 110  CO2 23 22  GLUCOSE 100* 130*  BUN 12 14  CREATININE 0.96 0.99  CALCIUM 8.6* 8.2*   Lipid Panel:     Component Value Date/Time   CHOL 177 07/19/2018 0412   TRIG 92 07/19/2018 0412   HDL 34 (L) 07/19/2018 0412   CHOLHDL 5.2 07/19/2018 0412   VLDL 18 07/19/2018 0412   LDLCALC 125 (H) 07/19/2018 0412   HgbA1c:  Lab Results  Component Value Date   HGBA1C 5.3 07/19/2018   Urine Drug Screen:     Component Value Date/Time   LABOPIA NONE DETECTED 02/04/2011 1911   COCAINSCRNUR NONE DETECTED 02/04/2011 1911   COCAINSCRNUR NEGATIVE 01/22/2011 0451   LABBENZ NONE DETECTED 02/04/2011 1911   LABBENZ NEGATIVE 01/22/2011 0451   AMPHETMU NONE DETECTED 02/04/2011 1911   THCU NONE DETECTED 02/04/2011 1911   LABBARB NONE DETECTED 02/04/2011 1911    Alcohol Level     Component Value Date/Time   ETH <11 (H) 02/04/2011 1931    IMAGING Dg Chest 2 View  Result Date: 07/18/2018 CLINICAL DATA:  Altered mental status and tachycardia. EXAM: CHEST - 2 VIEW COMPARISON:  Chest x-ray dated June 10, 2018. FINDINGS: Stable cardiomediastinal silhouette. Persistent low lung volumes with bronchovascular crowding. Atherosclerotic calcification of the  aortic arch. No focal consolidation, pleural effusion, or pneumothorax. No acute osseous abnormality. Chronic thoracolumbar compression deformities are unchanged. IMPRESSION: Persistent low lung volumes.  No active cardiopulmonary disease. Electronically Signed   By: June 12, 2018 M.D.   On: 07/18/2018 11:27   Ct Head Wo Contrast  Result Date: 07/18/2018 CLINICAL DATA:  Dementia.  Mental status changes.  Right-sided gaze. EXAM: CT HEAD WITHOUT CONTRAST TECHNIQUE: Contiguous axial images were obtained from the base of the skull through the vertex without intravenous contrast. COMPARISON:  06/10/2018 FINDINGS: Brain: There is low-density affecting the lateral aspect of the right temporal lobe and the right temporoparietal junction region with swelling consistent with acute to subacute right MCA branch vessel infarction. No associated hemorrhage. Generalized brain atrophy elsewhere with extensive chronic small-vessel ischemic changes throughout the white matter. No hydrocephalus or extra-axial collection. Vascular: There is atherosclerotic calcification of the major vessels at the base of the brain. Skull: Normal Sinuses/Orbits: Clear/normal Other: None IMPRESSION: Acute/subacute infarction affecting right MCA branch vessel territory serving the right temporal lobe and temporoparietal junction region. Mild swelling but no hemorrhage. No significant mass effect. Extensive chronic small-vessel ischemic changes elsewhere throughout brain. Electronically Signed   By: 06/12/2018 M.D.   On: 07/18/2018  11:53   Mr Shirlee Latch ZO Contrast  Result Date: 07/18/2018 CLINICAL DATA:  Stroke follow-up. Suspected stroke. Alzheimer's dementia. Tachycardia.  Atrial fibrillation.  Lethargy. EXAM: MR HEAD WITHOUT CONTRAST MR CIRCLE OF WILLIS WITHOUT CONTRAST MRA OF THE NECK WITHOUT  CONTRAST TECHNIQUE: Multiplanar, multiecho pulse sequences of the brain, circle of willis and surrounding structures were obtained without  intravenous contrast. Angiographic images of the neck were obtained using MRA technique without intravenous contrast. CONTRAST:  None. COMPARISON:  CT head earlier today.  CTA head neck 07/04/2017. FINDINGS: MR HEAD FINDINGS Brain: Large area of restricted diffusion affecting much of the RIGHT temporal lobe, inferior portion of the insula, RIGHT occipital lobe, and RIGHT parietal lobe as well as underlying white matter consistent with acute infarction. No visible hemorrhage. Slight mass effect on the uninvolved brain, but no significant midline shift. Vascular: Reported separately. Skull and upper cervical spine: Slight motion degradation. Grossly negative. Sinuses/Orbits: No significant sinus opacity. BILATERAL cataract extraction. Other: None. MR CIRCLE OF WILLIS FINDINGS The internal carotid arteries are widely patent. Both anterior cerebral arteries are patent, slight irregularity of the RIGHT A1, supplying widely patent A2 and A3 segments bilaterally. LEFT MCA M1 segment widely patent. Normal appearing MCA bifurcation, with filling of the M3 branches. Widely patent RIGHT M1 MCA. At M2 bifurcation, there is significant irregularity of the superior M2 division, poor flow related enhancement into the sub served M3 branches. The inferior M2 MCA division is not visualized due to lack of flow related enhancement, presumably due to an embolus. The basilar artery is widely patent with vertebrals codominant. Both posterior cerebral arteries fill in a normal fashion. There is no definite cerebellar branch occlusion. No saccular aneurysm is observed. MRA NECK FINDINGS Noncontrast MRA of the extracranial circulation is of relatively poor quality. Great vessel origins are not visualized. No visible RIGHT common carotid artery lesions, although there is poor signal throughout the proximal, mid, and distal segments. At the RIGHT carotid bifurcation, there is a suspected stenosis estimated 50% of the proximal RIGHT internal  carotid artery 1 cm above bifurcation. The RIGHT external carotid artery appears diffusely diseased. No visible LEFT common carotid artery lesions, although tortuosity results in signal dropout in its proximal and mid portions. At the LEFT carotid bifurcation, there appears to be a possible flow-limiting stenosis, estimated 75%, involving the proximal LEFT ICA just above the bifurcation. The LEFT external carotid artery appears diffusely diseased. Both vertebral arteries are patent, RIGHT dominant. Ostial narrowing is not assessed. IMPRESSION: Large acute RIGHT MCA territory infarct, involving much of the RIGHT temporal lobe, lateral occipital lobe, and inferior parietal lobe. No hemorrhage. No significant flow related enhancement within the inferior RIGHT M2 MCA division. Severely diseased superior RIGHT M2 MCA and subserving branches. No intracranial RIGHT ICA or M1 MCA stenosis. Poor quality extracranial MRA reveals BILATERAL carotid bifurcation disease, worse on the LEFT with patent vertebral arteries, RIGHT dominant. The quality of this noncontrast MRA neck is relatively poor, and if additional information is needed, CTA head neck is recommended for further evaluation. Electronically Signed   By: Elsie Stain M.D.   On: 07/18/2018 18:16   Mr Maxine Glenn Neck Wo Contrast  Result Date: 07/18/2018 CLINICAL DATA:  Stroke follow-up. Suspected stroke. Alzheimer's dementia. Tachycardia.  Atrial fibrillation.  Lethargy. EXAM: MR HEAD WITHOUT CONTRAST MR CIRCLE OF WILLIS WITHOUT CONTRAST MRA OF THE NECK WITHOUT  CONTRAST TECHNIQUE: Multiplanar, multiecho pulse sequences of the brain, circle of willis and surrounding structures were obtained without intravenous contrast.  Angiographic images of the neck were obtained using MRA technique without intravenous contrast. CONTRAST:  None. COMPARISON:  CT head earlier today.  CTA head neck 07/04/2017. FINDINGS: MR HEAD FINDINGS Brain: Large area of restricted diffusion affecting  much of the RIGHT temporal lobe, inferior portion of the insula, RIGHT occipital lobe, and RIGHT parietal lobe as well as underlying white matter consistent with acute infarction. No visible hemorrhage. Slight mass effect on the uninvolved brain, but no significant midline shift. Vascular: Reported separately. Skull and upper cervical spine: Slight motion degradation. Grossly negative. Sinuses/Orbits: No significant sinus opacity. BILATERAL cataract extraction. Other: None. MR CIRCLE OF WILLIS FINDINGS The internal carotid arteries are widely patent. Both anterior cerebral arteries are patent, slight irregularity of the RIGHT A1, supplying widely patent A2 and A3 segments bilaterally. LEFT MCA M1 segment widely patent. Normal appearing MCA bifurcation, with filling of the M3 branches. Widely patent RIGHT M1 MCA. At M2 bifurcation, there is significant irregularity of the superior M2 division, poor flow related enhancement into the sub served M3 branches. The inferior M2 MCA division is not visualized due to lack of flow related enhancement, presumably due to an embolus. The basilar artery is widely patent with vertebrals codominant. Both posterior cerebral arteries fill in a normal fashion. There is no definite cerebellar branch occlusion. No saccular aneurysm is observed. MRA NECK FINDINGS Noncontrast MRA of the extracranial circulation is of relatively poor quality. Great vessel origins are not visualized. No visible RIGHT common carotid artery lesions, although there is poor signal throughout the proximal, mid, and distal segments. At the RIGHT carotid bifurcation, there is a suspected stenosis estimated 50% of the proximal RIGHT internal carotid artery 1 cm above bifurcation. The RIGHT external carotid artery appears diffusely diseased. No visible LEFT common carotid artery lesions, although tortuosity results in signal dropout in its proximal and mid portions. At the LEFT carotid bifurcation, there appears to  be a possible flow-limiting stenosis, estimated 75%, involving the proximal LEFT ICA just above the bifurcation. The LEFT external carotid artery appears diffusely diseased. Both vertebral arteries are patent, RIGHT dominant. Ostial narrowing is not assessed. IMPRESSION: Large acute RIGHT MCA territory infarct, involving much of the RIGHT temporal lobe, lateral occipital lobe, and inferior parietal lobe. No hemorrhage. No significant flow related enhancement within the inferior RIGHT M2 MCA division. Severely diseased superior RIGHT M2 MCA and subserving branches. No intracranial RIGHT ICA or M1 MCA stenosis. Poor quality extracranial MRA reveals BILATERAL carotid bifurcation disease, worse on the LEFT with patent vertebral arteries, RIGHT dominant. The quality of this noncontrast MRA neck is relatively poor, and if additional information is needed, CTA head neck is recommended for further evaluation. Electronically Signed   By: Elsie Stain M.D.   On: 07/18/2018 18:16   Mr Brain Wo Contrast  Result Date: 07/18/2018 CLINICAL DATA:  Stroke follow-up. Suspected stroke. Alzheimer's dementia. Tachycardia.  Atrial fibrillation.  Lethargy. EXAM: MR HEAD WITHOUT CONTRAST MR CIRCLE OF WILLIS WITHOUT CONTRAST MRA OF THE NECK WITHOUT  CONTRAST TECHNIQUE: Multiplanar, multiecho pulse sequences of the brain, circle of willis and surrounding structures were obtained without intravenous contrast. Angiographic images of the neck were obtained using MRA technique without intravenous contrast. CONTRAST:  None. COMPARISON:  CT head earlier today.  CTA head neck 07/04/2017. FINDINGS: MR HEAD FINDINGS Brain: Large area of restricted diffusion affecting much of the RIGHT temporal lobe, inferior portion of the insula, RIGHT occipital lobe, and RIGHT parietal lobe as well as underlying white matter consistent with acute  infarction. No visible hemorrhage. Slight mass effect on the uninvolved brain, but no significant midline shift.  Vascular: Reported separately. Skull and upper cervical spine: Slight motion degradation. Grossly negative. Sinuses/Orbits: No significant sinus opacity. BILATERAL cataract extraction. Other: None. MR CIRCLE OF WILLIS FINDINGS The internal carotid arteries are widely patent. Both anterior cerebral arteries are patent, slight irregularity of the RIGHT A1, supplying widely patent A2 and A3 segments bilaterally. LEFT MCA M1 segment widely patent. Normal appearing MCA bifurcation, with filling of the M3 branches. Widely patent RIGHT M1 MCA. At M2 bifurcation, there is significant irregularity of the superior M2 division, poor flow related enhancement into the sub served M3 branches. The inferior M2 MCA division is not visualized due to lack of flow related enhancement, presumably due to an embolus. The basilar artery is widely patent with vertebrals codominant. Both posterior cerebral arteries fill in a normal fashion. There is no definite cerebellar branch occlusion. No saccular aneurysm is observed. MRA NECK FINDINGS Noncontrast MRA of the extracranial circulation is of relatively poor quality. Great vessel origins are not visualized. No visible RIGHT common carotid artery lesions, although there is poor signal throughout the proximal, mid, and distal segments. At the RIGHT carotid bifurcation, there is a suspected stenosis estimated 50% of the proximal RIGHT internal carotid artery 1 cm above bifurcation. The RIGHT external carotid artery appears diffusely diseased. No visible LEFT common carotid artery lesions, although tortuosity results in signal dropout in its proximal and mid portions. At the LEFT carotid bifurcation, there appears to be a possible flow-limiting stenosis, estimated 75%, involving the proximal LEFT ICA just above the bifurcation. The LEFT external carotid artery appears diffusely diseased. Both vertebral arteries are patent, RIGHT dominant. Ostial narrowing is not assessed. IMPRESSION: Large  acute RIGHT MCA territory infarct, involving much of the RIGHT temporal lobe, lateral occipital lobe, and inferior parietal lobe. No hemorrhage. No significant flow related enhancement within the inferior RIGHT M2 MCA division. Severely diseased superior RIGHT M2 MCA and subserving branches. No intracranial RIGHT ICA or M1 MCA stenosis. Poor quality extracranial MRA reveals BILATERAL carotid bifurcation disease, worse on the LEFT with patent vertebral arteries, RIGHT dominant. The quality of this noncontrast MRA neck is relatively poor, and if additional information is needed, CTA head neck is recommended for further evaluation. Electronically Signed   By: Elsie Stain M.D.   On: 07/18/2018 18:16    PHYSICAL EXAM Constitutional: thin, muscle wasting Eyes: No scleral injection HENT: No OP obstrucion Head: Normocephalic.  Cardiovascular: Normal rate and regular rhythm.  Respiratory: LCTAB  Skin: WDI, mucous membranes, dry  Neuro: Mental Status: Globally aphasic. Patient has no responses to verbal inputs.  Eyes open to noxious stimuli, does not follow commands. Agitated. Cranial Nerves: II: No blink to threat bilaterally. PERRL III,IV, VI: eyes midline, eyes do not cross midline when head is turned with doll's maneuver.  V: Grimaces with brow ridge pressure bilaterally VII: Left facial droop VIII: No response to voice Motor/sensory: Withdraws from noxious stimuli in all 4 extremities, less briskly on the left. Patient does localize to noxious stimuli with sternal rub but does so more briskly with her RUE than her left.  Patient is noted to have increased tone in her lower extremities, which will withdraw and thrash to plantar stimulation, with no definite asymmetry. Plantars: Downgoing bilaterally Cerebellar: Unable to examine   ASSESSMENT/PLAN Ms. ANIELLA WANDREY is a 84 y.o. female with history of RA,Migranes, CAD, dementia, A fib ( not on anticoagulation), presenting  To [facility] from [facility] with c/o Left sided weakness, AMS.   Stroke:  right MCA infarct embolic unknown source  CT head acute/subacute infarct affecting RMCA  MRI  Large acute Right MCA infarct; involving right temporal lobe, lateral occipital lobe, and inferior parietal lobe. No hemorrhage  MRA head and neck:  No significant flow related enhancement within the inferior RIGHT M2 MCA division. Severely diseased superior RIGHT M2 MCA and subserving branches. No intracranial RIGHT ICA or M1 MCA stenosis.Poor quality extracranial MRA reveals BILATERAL carotid bifurcation disease, worse on the LEFT with patent vertebral arteries, RIGHTdominant  Carotid Doppler  Right Carotid: Velocities in the right ICA are consistent with a 1-39% stenosis. Left Carotid: Not insonated. Vertebrals: Right vertebral artery demonstrates antegrade flow.Subclavians: Normal flow hemodynamics were seen in bilateral subclavia arteries. 2D Echo :Left ventricle: The cavity size was normal. Wall thickness was normal. Systolic function was normal. The estimated ejection fraction was in the range of 60% to 65%. The study is not technically sufficient to allow evaluation of LV diastolic function. - Aortic valve: There was mild stenosis. Valve area (VTI): 2.08   cm^2. Valve area (Vmean): 1.67 cm^2. - Mitral valve: Moderately calcified annulus. - Left atrium: The atrium was mildly dilated. - Right atrium: The atrium was mildly dilated. - Atrial septum: No defect or patent foramen ovale was identified.   Echo contrast study showed no right-to-left atrial level shunt,   at baseline or with provocation. Pulmonary arteries: PA peak pressure: 32 mm Hg  LDL 125  HgbA1c 5.3  lovenox for VTE prophylaxis  Diet Order    None       aspirin 81 mg daily prior to admission, now on none.   Therapy recommendations:  SLP: NPO; OT/PT: pending  Disposition:  pending  Hypertension  Stable . Permissive hypertension (OK if < 220/120) but gradually  normalize in 5-7 days . Long-term BP goal normotensive  Hyperlipidemia  Home meds:  pravastatin,  Not resumed in hospital  LDL 125, goal < 70  Continue statin at discharge  Diabetes type II  HgbA1c 5.3, goal < 7.0  Controlled  Other Stroke Risk Factors  Advanced age  Family hx stroke (mother)  Coronary artery disease  Migraines   Other Active Problems  Sepsis  Dementia  A-fib with RVR  Hospital day # 1 Valentina Lucks, MSN, NP-C [facility] [phone]  ATTENDING NOTE: I reviewed above note and agree with the assessment and plan. Pt was seen and examined.   84 year old female with history of A. fib on aspirin, CAD/MI status post stenting, dementia, hypertension, cardiomyopathy with EF 45%, migraine, rheumatoid arthritis and upper GI bleeding admitted for altered mental status, aphasia, right gaze, poor p.o. Intake.  MRI and CT showed right MCA infarct mostly posterior M2 territory.  MRA head showed right M2 high-grade stenosis.  Carotid Doppler unremarkable however left ICA not able to insonate due to noncooperation.  EF 60 to 65%.  A1c 5.3 and LDL 125.  She was found to have UTI with UA WBC more than 50 and a fever one 1.5, was treated with cefepime and vancomycin.  Blood culture pending.  After admission, given severe neurological deficit on top of baseline poor medical condition, family decided on comfort care measures only.  And likely transfer to residential hospice today.  Given comfort care measures, neuro examination was very limited.  Patient lying in bed, eyes closed with occasional eyelid fluttering.  No spontaneous movement.  No respiratory distress.  Patient  stroke likely due to atrial fibrillation not on anticoagulation.  From neurological standpoint, agree with family decision for comfort care only given poor prognosis.  Neurology will sign off. Please call with questions. Thanks for the consult.   Marvel Plan, MD PhD Stroke  Neurology 07/19/2018 4:45 PM     To contact Stroke Continuity provider, please refer to [email or web address]. After hours, contact General Neurology

## 2018-07-19 NOTE — Discharge Summary (Addendum)
[facility] Discharge Summary   Patient: Connie Wagner ZOX:[id number]   PCP: Merlene Laughter, MD DOB: [birth year]   Date of admission: 07/18/2018   Date of discharge:  07/19/2018    Discharge Diagnoses:  Principal diagnosis Large acute RIGHT MCA territory infarct  Active Problems:   HTN (hypertension)   Dementia (HCC)   Acute lower UTI   Coronary artery disease   Atrial fibrillation with RVR (HCC)   Acute CVA (cerebrovascular accident) (HCC)   Acute ischemic right MCA stroke [facility])   Palliative care encounter   Comfort measures only status   Encounter for hospice care discussion  Admitted From: home Disposition:  Inpatient hospice.   Recommendations for Outpatient Follow-up:  1. Please establish care with hospice at the facility, meds per hospice   Diet recommendation: comfort feeds.   Activity: The patient is advised to gradually reintroduce usual activities.  Discharge Condition: stable  Code Status: DNR DNI  History of present illness: As per the H and P dictated on admission, "Connie Wagner is a 84 y.o. female with history of multiple medical problems including CAD status post acute inferior MI, PCI 2014, ischemic cardiomyopathy, diastolic CHF, atrial fibrillation, hypertension, prior stroke 06/2017  (for which she received tPA), carotid stenosis, Alzheimer's dementia, depression, anxiety, anemia who presented to the ED from [facility] today due to altered mental status.  Per reports from nursing facility, patient is nonverbal and not oriented at baseline.  However, at baseline she does mumble and have a small amount of interaction with others.  Review of her chart reveals that she has had several admissions, around 1 a month, for falls.  According to the staff at her nursing facility, since yesterday she has not been acting like herself. She has been less interactive, more lethargic, not eating per usual.  She was tachycardic in route per EMS.  A baby aspirin is  on patient's prior to admission meds but according to records from the facility patient is not currently taking it for unclear reasons.  Take pravastatin 40 mg daily as well as digoxin and Lopressor for her A. fib.   ED Course:  Temperature 99.5, blood pressure 148/89 and right arm, heart rate 148, respiratory rate 32.  According to chart, she was 100% on room air.  During my interview, she was de-satting into the mid to low 80s on 2 L nasal cannula.  Chest x-ray was unremarkable.  Lactate is elevated at 2.05 which trended up to 3.82.  Code sepsis was initiated and she was given 1 L lactated Ringer's bolus, vancomycin and cefepime.  On exam she was noted to have a right gaze deviation which prompted head CT without contrast which revealed acute/subacute infarction affecting right MCA branch vessel territory serving the right temporal lobe and temporoparietal junction region. Mild swelling but no hemorrhage. No significant mass effect; extensive chronic small-vessel ischemic changes elsewhere throughout brain.  Patient was noted to be pulling at lines and tubes and was therefore given Haldol 2 mg IV x1.  She was noted to be wheezing and having decreased oxygen saturations."  Hospital Course:  Summary of her active problems in the hospital is as following.  Acute right MCA CVA: Outside of window for TPA.   Neurology has been consulted.   MRI of the brain without contrast showed Large acute RIGHT MCA territory infarct, involving much of the RIGHT temporal lobe, lateral occipital lobe, and inferior parietal lobe. No hemorrhage. Patient is globally lethargic and stuporous.  Unable to follow any commands.  Only responding to painful stimuli. Discussed with neurology recommended comfort care for the patient given her comorbidities. Discussed with family and palliative care was consulted. Patient was transition to comfort care here in the hospital with plan to go to inpatient hospice on  discharge.  Sepsis: ruled out T 99.5, tachycardia, AMS and lactic acidosis,  U/A shows pyuria. Code sepsis was initiated.   She has been treated with vancomycin, cefepime, lactated Ringer's 1 L bolus. Urine culture is growing multiple species blood culture shows no growth. At present I do not think that the patient's presentation is secondary to acute sepsis and actually it is more likely secondary to her large acute MCA CVA. At present patient's plan of care is to focus more on comfort.  We will discontinue the antibiotics.  Patient is being discharged to inpatient hospice facility.  Chronic Atrial fibrillation with RVR:  Heart rate has been anywhere between 121-150 since arrival. Her blood pressure is stable and on the high side. Patient is not on any anticoagulation for A. Fib. Now comfort care.   Hypoxemia vs inaccurate waveform -no h/o COPD, CXR looks clear  Hypertension:  Held metoprolol to allow for permissive hypertension.  Dementia: Advanced, no behavioral disturbances. Currently the patient is lethargic and not able to respond to any stimuli. Continue comfort care protocol.  CAD:  Stable.   Goals of care discussion. The patient presents with confusion and unresponsive. Work-up shows that she has a large right MCA territory infarct. There is also concern for UTI and initially on admission she was meeting sirs criteria. Patient has advanced dementia and at her baseline is wheelchair-bound. With her large infarct the prognosis remains poor as the patient is stuporous and unable to follow any commands. Patient is unable to maintain oral nutrition and therefore her prognosis will remain less than 2 weeks. This was discussed with daughter who is the next of kin and after this information the daughter decided to transition her to complete comfort. Inpatient hospice transfer was recommended to family and social worker was consulted and it was arranged.   On the day  of the discharge the patient's symptoms were well controlled, and no other acute medical condition were reported by patient. the patient was felt safe to be discharge at hospice with inpatient hospice.  Consultants: Palliative care, neurology Procedures: Echocardiogram  Study Conclusions  - Left ventricle: The cavity size was normal. Wall thickness was   normal. Systolic function was normal. The estimated ejection   fraction was in the range of 60% to 65%. The study is not   technically sufficient to allow evaluation of LV diastolic   function. - Aortic valve: There was mild stenosis. Valve area (VTI): 2.08   cm^2. Valve area (Vmean): 1.67 cm^2. - Mitral valve: Moderately calcified annulus. - Left atrium: The atrium was mildly dilated. - Right atrium: The atrium was mildly dilated. - Atrial septum: No defect or patent foramen ovale was identified.   Echo contrast study showed no right-to-left atrial level shunt,   at baseline or with provocation. - Pulmonary arteries: PA peak pressure: 32 mm Hg (S).  DISCHARGE MEDICATION: Allergies as of 07/19/2018      Reactions   Amoxicillin-pot Clavulanate Other (See Comments)   "Allergic," per [facility]   Codeine Other (See Comments)   "Allergic," per [facility]   Escitalopram Oxalate Other (See Comments)   "Allergic," per [facility]   Iron Nausea And Vomiting, Other (See Comments)   Loose  stools, also   Losartan Potassium Other (See Comments)   "Allergic," per [facility]   Metronidazole Other (See Comments)   "Allergic," per [facility]   Raloxifene Hcl Other (See Comments)   "Allergic," per [facility]   Tetracycline Hcl Other (See Comments)   "Allergic," per [facility]   Penicillins Rash   Has patient had a PCN reaction causing immediate rash, facial/tongue/throat swelling, SOB or lightheadedness with hypotension: Yes Has patient had a PCN reaction causing severe rash involving mucus membranes or skin necrosis: No Has patient had a PCN reaction that required hospitalization No Has  patient had a PCN reaction occurring within the last 10 years: No If all of the above answers are "NO", then may proceed with Cephalosporin use.   Raloxifene Rash   Sulfa Drugs Cross Reactors Rash      Medication List    STOP taking these medications   acetaminophen 325 MG tablet Commonly known as:  TYLENOL   acetaminophen 500 MG tablet Commonly known as:  TYLENOL   aspirin 81 MG EC tablet   benzonatate 200 MG capsule Commonly known as:  TESSALON   digoxin 0.125 MG tablet Commonly known as:  LANOXIN   divalproex 250 MG DR tablet Commonly known as:  DEPAKOTE   famotidine 40 MG/5ML suspension Commonly known as:  PEPCID   ferrous sulfate 325 (65 FE) MG tablet   guaiFENesin 600 MG 12 hr tablet Commonly known as:  MUCINEX   metoprolol tartrate 50 MG tablet Commonly known as:  LOPRESSOR   pantoprazole 40 MG tablet Commonly known as:  PROTONIX   polyethylene glycol packet Commonly known as:  MIRALAX / GLYCOLAX   pravastatin 40 MG tablet Commonly known as:  PRAVACHOL   QUEtiapine 25 MG tablet Commonly known as:  SEROQUEL      Allergies  Allergen Reactions  . Amoxicillin-Pot Clavulanate Other (See Comments)    "Allergic," per [facility]  . Codeine Other (See Comments)    "Allergic," per [facility]  . Escitalopram Oxalate Other (See Comments)    "Allergic," per [facility]  . Iron Nausea And Vomiting and Other (See Comments)    Loose stools, also  . Losartan Potassium Other (See Comments)    "Allergic," per [facility]  . Metronidazole Other (See Comments)    "Allergic," per [facility]  . Raloxifene Hcl Other (See Comments)    "Allergic," per [facility]  . Tetracycline Hcl Other (See Comments)    "Allergic," per [facility]  . Penicillins Rash    Has patient had a PCN reaction causing immediate rash, facial/tongue/throat swelling, SOB or lightheadedness with hypotension: Yes Has patient had a PCN reaction causing severe rash involving mucus membranes or skin necrosis: No Has patient had a PCN reaction that  required hospitalization No Has patient had a PCN reaction occurring within the last 10 years: No If all of the above answers are "NO", then may proceed with Cephalosporin use.   . Raloxifene Rash  . Sulfa Drugs Cross Reactors Rash   Discharge Instructions    Comfort measures   Complete by:  As directed    Increase activity slowly   Complete by:  As directed    May have comfort feeds from floor stock   Complete by:  As directed      Discharge Exam: Filed Weights   07/18/18 1224  Weight: 68 kg   Vitals:   07/19/18 0801 07/19/18 1200  BP: 114/66 (!) 144/78  Pulse: 91 (!) 115  Resp: 19 (!) 23  Temp: 97.9 F (36.6  C) (!) 100.5 F (38.1 C)  SpO2: 99% 97%   General: Appear in moderate distress, no Rash; Oral Mucosa moist. Cardiovascular: S1 and S2 Present, aortic systolic  Murmur, no JVD Respiratory: Bilateral Air entry present and Clear to Auscultation, no Crackles, no wheezes Abdomen: Bowel Sound present, Soft and no tenderness Extremities: no Pedal edema, no calf tenderness Neurology: Lethargic, not responding to verbal commands, no purposeful movement.  The results of significant diagnostics from this hospitalization (including imaging, microbiology, ancillary and laboratory) are listed below for reference.    Significant Diagnostic Studies: Dg Chest 2 View  Result Date: 07/18/2018 CLINICAL DATA:  Altered mental status and tachycardia. EXAM: CHEST - 2 VIEW COMPARISON:  Chest x-ray dated June 10, 2018. FINDINGS: Stable cardiomediastinal silhouette. Persistent low lung volumes with bronchovascular crowding. Atherosclerotic calcification of the aortic arch. No focal consolidation, pleural effusion, or pneumothorax. No acute osseous abnormality. Chronic thoracolumbar compression deformities are unchanged. IMPRESSION: Persistent low lung volumes.  No active cardiopulmonary disease. Electronically Signed   By: Obie Dredge M.D.   On: 07/18/2018 11:27   Ct Head Wo  Contrast  Result Date: 07/18/2018 CLINICAL DATA:  Dementia.  Mental status changes.  Right-sided gaze. EXAM: CT HEAD WITHOUT CONTRAST TECHNIQUE: Contiguous axial images were obtained from the base of the skull through the vertex without intravenous contrast. COMPARISON:  06/10/2018 FINDINGS: Brain: There is low-density affecting the lateral aspect of the right temporal lobe and the right temporoparietal junction region with swelling consistent with acute to subacute right MCA branch vessel infarction. No associated hemorrhage. Generalized brain atrophy elsewhere with extensive chronic small-vessel ischemic changes throughout the white matter. No hydrocephalus or extra-axial collection. Vascular: There is atherosclerotic calcification of the major vessels at the base of the brain. Skull: Normal Sinuses/Orbits: Clear/normal Other: None IMPRESSION: Acute/subacute infarction affecting right MCA branch vessel territory serving the right temporal lobe and temporoparietal junction region. Mild swelling but no hemorrhage. No significant mass effect. Extensive chronic small-vessel ischemic changes elsewhere throughout brain. Electronically Signed   By: Paulina Fusi M.D.   On: 07/18/2018 11:53   Mr Maxine Glenn Head Wo Contrast  Result Date: 07/18/2018 CLINICAL DATA:  Stroke follow-up. Suspected stroke. Alzheimer's dementia. Tachycardia.  Atrial fibrillation.  Lethargy. EXAM: MR HEAD WITHOUT CONTRAST MR CIRCLE OF WILLIS WITHOUT CONTRAST MRA OF THE NECK WITHOUT  CONTRAST TECHNIQUE: Multiplanar, multiecho pulse sequences of the brain, circle of willis and surrounding structures were obtained without intravenous contrast. Angiographic images of the neck were obtained using MRA technique without intravenous contrast. CONTRAST:  None. COMPARISON:  CT head earlier today.  CTA head neck 07/04/2017. FINDINGS: MR HEAD FINDINGS Brain: Large area of restricted diffusion affecting much of the RIGHT temporal lobe, inferior portion of the  insula, RIGHT occipital lobe, and RIGHT parietal lobe as well as underlying white matter consistent with acute infarction. No visible hemorrhage. Slight mass effect on the uninvolved brain, but no significant midline shift. Vascular: Reported separately. Skull and upper cervical spine: Slight motion degradation. Grossly negative. Sinuses/Orbits: No significant sinus opacity. BILATERAL cataract extraction. Other: None. MR CIRCLE OF WILLIS FINDINGS The internal carotid arteries are widely patent. Both anterior cerebral arteries are patent, slight irregularity of the RIGHT A1, supplying widely patent A2 and A3 segments bilaterally. LEFT MCA M1 segment widely patent. Normal appearing MCA bifurcation, with filling of the M3 branches. Widely patent RIGHT M1 MCA. At M2 bifurcation, there is significant irregularity of the superior M2 division, poor flow related enhancement into the sub served M3 branches. The  inferior M2 MCA division is not visualized due to lack of flow related enhancement, presumably due to an embolus. The basilar artery is widely patent with vertebrals codominant. Both posterior cerebral arteries fill in a normal fashion. There is no definite cerebellar branch occlusion. No saccular aneurysm is observed. MRA NECK FINDINGS Noncontrast MRA of the extracranial circulation is of relatively poor quality. Great vessel origins are not visualized. No visible RIGHT common carotid artery lesions, although there is poor signal throughout the proximal, mid, and distal segments. At the RIGHT carotid bifurcation, there is a suspected stenosis estimated 50% of the proximal RIGHT internal carotid artery 1 cm above bifurcation. The RIGHT external carotid artery appears diffusely diseased. No visible LEFT common carotid artery lesions, although tortuosity results in signal dropout in its proximal and mid portions. At the LEFT carotid bifurcation, there appears to be a possible flow-limiting stenosis, estimated 75%,  involving the proximal LEFT ICA just above the bifurcation. The LEFT external carotid artery appears diffusely diseased. Both vertebral arteries are patent, RIGHT dominant. Ostial narrowing is not assessed. IMPRESSION: Large acute RIGHT MCA territory infarct, involving much of the RIGHT temporal lobe, lateral occipital lobe, and inferior parietal lobe. No hemorrhage. No significant flow related enhancement within the inferior RIGHT M2 MCA division. Severely diseased superior RIGHT M2 MCA and subserving branches. No intracranial RIGHT ICA or M1 MCA stenosis. Poor quality extracranial MRA reveals BILATERAL carotid bifurcation disease, worse on the LEFT with patent vertebral arteries, RIGHT dominant. The quality of this noncontrast MRA neck is relatively poor, and if additional information is needed, CTA head neck is recommended for further evaluation. Electronically Signed   By: Elsie Stain M.D.   On: 07/18/2018 18:16   Mr Maxine Glenn Neck Wo Contrast  Result Date: 07/18/2018 CLINICAL DATA:  Stroke follow-up. Suspected stroke. Alzheimer's dementia. Tachycardia.  Atrial fibrillation.  Lethargy. EXAM: MR HEAD WITHOUT CONTRAST MR CIRCLE OF WILLIS WITHOUT CONTRAST MRA OF THE NECK WITHOUT  CONTRAST TECHNIQUE: Multiplanar, multiecho pulse sequences of the brain, circle of willis and surrounding structures were obtained without intravenous contrast. Angiographic images of the neck were obtained using MRA technique without intravenous contrast. CONTRAST:  None. COMPARISON:  CT head earlier today.  CTA head neck 07/04/2017. FINDINGS: MR HEAD FINDINGS Brain: Large area of restricted diffusion affecting much of the RIGHT temporal lobe, inferior portion of the insula, RIGHT occipital lobe, and RIGHT parietal lobe as well as underlying white matter consistent with acute infarction. No visible hemorrhage. Slight mass effect on the uninvolved brain, but no significant midline shift. Vascular: Reported separately. Skull and upper  cervical spine: Slight motion degradation. Grossly negative. Sinuses/Orbits: No significant sinus opacity. BILATERAL cataract extraction. Other: None. MR CIRCLE OF WILLIS FINDINGS The internal carotid arteries are widely patent. Both anterior cerebral arteries are patent, slight irregularity of the RIGHT A1, supplying widely patent A2 and A3 segments bilaterally. LEFT MCA M1 segment widely patent. Normal appearing MCA bifurcation, with filling of the M3 branches. Widely patent RIGHT M1 MCA. At M2 bifurcation, there is significant irregularity of the superior M2 division, poor flow related enhancement into the sub served M3 branches. The inferior M2 MCA division is not visualized due to lack of flow related enhancement, presumably due to an embolus. The basilar artery is widely patent with vertebrals codominant. Both posterior cerebral arteries fill in a normal fashion. There is no definite cerebellar branch occlusion. No saccular aneurysm is observed. MRA NECK FINDINGS Noncontrast MRA of the extracranial circulation is of relatively poor quality. [employer]  vessel origins are not visualized. No visible RIGHT common carotid artery lesions, although there is poor signal throughout the proximal, mid, and distal segments. At the RIGHT carotid bifurcation, there is a suspected stenosis estimated 50% of the proximal RIGHT internal carotid artery 1 cm above bifurcation. The RIGHT external carotid artery appears diffusely diseased. No visible LEFT common carotid artery lesions, although tortuosity results in signal dropout in its proximal and mid portions. At the LEFT carotid bifurcation, there appears to be a possible flow-limiting stenosis, estimated 75%, involving the proximal LEFT ICA just above the bifurcation. The LEFT external carotid artery appears diffusely diseased. Both vertebral arteries are patent, RIGHT dominant. Ostial narrowing is not assessed. IMPRESSION: Large acute RIGHT MCA territory infarct, involving much  of the RIGHT temporal lobe, lateral occipital lobe, and inferior parietal lobe. No hemorrhage. No significant flow related enhancement within the inferior RIGHT M2 MCA division. Severely diseased superior RIGHT M2 MCA and subserving branches. No intracranial RIGHT ICA or M1 MCA stenosis. Poor quality extracranial MRA reveals BILATERAL carotid bifurcation disease, worse on the LEFT with patent vertebral arteries, RIGHT dominant. The quality of this noncontrast MRA neck is relatively poor, and if additional information is needed, CTA head neck is recommended for further evaluation. Electronically Signed   By: Elsie Stain M.D.   On: 07/18/2018 18:16   Mr Brain Wo Contrast  Result Date: 07/18/2018 CLINICAL DATA:  Stroke follow-up. Suspected stroke. Alzheimer's dementia. Tachycardia.  Atrial fibrillation.  Lethargy. EXAM: MR HEAD WITHOUT CONTRAST MR CIRCLE OF WILLIS WITHOUT CONTRAST MRA OF THE NECK WITHOUT  CONTRAST TECHNIQUE: Multiplanar, multiecho pulse sequences of the brain, circle of willis and surrounding structures were obtained without intravenous contrast. Angiographic images of the neck were obtained using MRA technique without intravenous contrast. CONTRAST:  None. COMPARISON:  CT head earlier today.  CTA head neck 07/04/2017. FINDINGS: MR HEAD FINDINGS Brain: Large area of restricted diffusion affecting much of the RIGHT temporal lobe, inferior portion of the insula, RIGHT occipital lobe, and RIGHT parietal lobe as well as underlying white matter consistent with acute infarction. No visible hemorrhage. Slight mass effect on the uninvolved brain, but no significant midline shift. Vascular: Reported separately. Skull and upper cervical spine: Slight motion degradation. Grossly negative. Sinuses/Orbits: No significant sinus opacity. BILATERAL cataract extraction. Other: None. MR CIRCLE OF WILLIS FINDINGS The internal carotid arteries are widely patent. Both anterior cerebral arteries are patent, slight  irregularity of the RIGHT A1, supplying widely patent A2 and A3 segments bilaterally. LEFT MCA M1 segment widely patent. Normal appearing MCA bifurcation, with filling of the M3 branches. Widely patent RIGHT M1 MCA. At M2 bifurcation, there is significant irregularity of the superior M2 division, poor flow related enhancement into the sub served M3 branches. The inferior M2 MCA division is not visualized due to lack of flow related enhancement, presumably due to an embolus. The basilar artery is widely patent with vertebrals codominant. Both posterior cerebral arteries fill in a normal fashion. There is no definite cerebellar branch occlusion. No saccular aneurysm is observed. MRA NECK FINDINGS Noncontrast MRA of the extracranial circulation is of relatively poor quality. Great vessel origins are not visualized. No visible RIGHT common carotid artery lesions, although there is poor signal throughout the proximal, mid, and distal segments. At the RIGHT carotid bifurcation, there is a suspected stenosis estimated 50% of the proximal RIGHT internal carotid artery 1 cm above bifurcation. The RIGHT external carotid artery appears diffusely diseased. No visible LEFT common carotid artery lesions, although tortuosity results  in signal dropout in its proximal and mid portions. At the LEFT carotid bifurcation, there appears to be a possible flow-limiting stenosis, estimated 75%, involving the proximal LEFT ICA just above the bifurcation. The LEFT external carotid artery appears diffusely diseased. Both vertebral arteries are patent, RIGHT dominant. Ostial narrowing is not assessed. IMPRESSION: Large acute RIGHT MCA territory infarct, involving much of the RIGHT temporal lobe, lateral occipital lobe, and inferior parietal lobe. No hemorrhage. No significant flow related enhancement within the inferior RIGHT M2 MCA division. Severely diseased superior RIGHT M2 MCA and subserving branches. No intracranial RIGHT ICA or M1 MCA  stenosis. Poor quality extracranial MRA reveals BILATERAL carotid bifurcation disease, worse on the LEFT with patent vertebral arteries, RIGHT dominant. The quality of this noncontrast MRA neck is relatively poor, and if additional information is needed, CTA head neck is recommended for further evaluation. Electronically Signed   By: Elsie Stain M.D.   On: 07/18/2018 18:16   Vas US Carotid  Result Date: 07/19/2018 Carotid Arterial Duplex Study Indications:       CVA. Risk Factors:      Hypertension, coronary artery disease, prior CVA. Limitations:       AMS, inability to turn head, tortuous vessels Comparison Study:  Prior normal study done 01/29/14 is available for comparison Performing Technologist: Sherren Kerns RVS  Examination Guidelines: A complete evaluation includes B-mode imaging, spectral Doppler, color Doppler, and power Doppler as needed of all accessible portions of each vessel. Bilateral testing is considered an integral part of a complete examination. Limited examinations for reoccurring indications may be performed as noted.  Right Carotid Findings: +----------+--------+--------+--------+------------+--------+           PSV cm/sEDV cm/sStenosisDescribe    Comments +----------+--------+--------+--------+------------+--------+ CCA Prox  75      14                                   +----------+--------+--------+--------+------------+--------+ CCA Distal57      14                                   +----------+--------+--------+--------+------------+--------+ ICA Prox  61      17              heterogenoustortuous +----------+--------+--------+--------+------------+--------+ ICA Distal45      14                          tortuous +----------+--------+--------+--------+------------+--------+ ECA       58      8                           tortuous +----------+--------+--------+--------+------------+--------+  +----------+--------+-------+--------+-------------------+           PSV cm/sEDV cmsDescribeArm Pressure (mmHG) +----------+--------+-------+--------+-------------------+ Subclavian101                                        +----------+--------+-------+--------+-------------------+ +---------+--------+--+--------+--+ VertebralPSV cm/s64EDV cm/s10 +---------+--------+--+--------+--+  Left Carotid Findings: +----------+--------+--------+--------+-------------------+ SubclavianPSV cm/sEDV cm/sDescribeArm Pressure (mmHG) +----------+--------+--------+--------+-------------------+           55                                          +----------+--------+--------+--------+-------------------+  Summary: Right Carotid: Velocities in the right ICA are consistent with a 1-39% stenosis. Left Carotid: Not insonated. Vertebrals:  Right vertebral artery demonstrates antegrade flow. Subclavians: Normal flow hemodynamics were seen in bilateral subclavian              arteries. *See table(s) above for measurements and observations.  Electronically signed by Delia Heady MD on 07/19/2018 at 1:29:47 PM.    Final     Microbiology: Recent Results (from the past 240 hour(s))  Blood Culture (routine x 2)     Status: None (Preliminary result)   Collection Time: 07/18/18 10:18 AM  Result Value Ref Range Status   Specimen Description BLOOD BLOOD RIGHT FOREARM  Final   Special Requests   Final    BOTTLES DRAWN AEROBIC AND ANAEROBIC Blood Culture adequate volume   Culture   Final    NO GROWTH 1 DAY Performed at [facility], [address] [place], [place] [address]    Report Status PENDING  Incomplete  Blood Culture (routine x 2)     Status: None (Preliminary result)   Collection Time: 07/18/18 10:31 AM  Result Value Ref Range Status   Specimen Description BLOOD RIGHT ANTECUBITAL  Final   Special Requests   Final    BOTTLES DRAWN AEROBIC AND ANAEROBIC Blood Culture results may not be  optimal due to an excessive volume of blood received in culture bottles   Culture   Final    NO GROWTH 1 DAY Performed at [facility], [address], [place], [place] [address]    Report Status PENDING  Incomplete  Urine culture     Status: Abnormal   Collection Time: 07/18/18 12:24 PM  Result Value Ref Range Status   Specimen Description URINE, CATHETERIZED  Final   Special Requests   Final    Normal Performed at [facility], [address], [place], [place] [address]    Culture MULTIPLE SPECIES PRESENT, SUGGEST RECOLLECTION (A)  Final   Report Status 07/19/2018 FINAL  Final  MRSA PCR Screening     Status: None   Collection Time: 07/18/18  6:45 PM  Result Value Ref Range Status   MRSA by PCR NEGATIVE NEGATIVE Final    Comment:        The GeneXpert MRSA Assay (FDA approved for NASAL specimens only), is one component of a comprehensive MRSA colonization surveillance program. It is not intended to diagnose MRSA infection nor to guide or monitor treatment for MRSA infections. Performed at [facility], [address] [place], [place] [address]      Labs: CBC: Recent Labs  Lab 07/18/18 1018 07/19/18 0412  WBC 9.3 10.8*  NEUTROABS 7.7  --   HGB 11.5* 10.5*  HCT 39.4 35.0*  MCV 94.7 94.3  PLT 199 152   Basic Metabolic Panel: Recent Labs  Lab 07/18/18 1018 07/19/18 0412  NA 139 139  K 4.7 3.9  CL 106 110  CO2 23 22  GLUCOSE 100* 130*  BUN 12 14  CREATININE 0.96 0.99  CALCIUM 8.6* 8.2*   Liver Function Tests: Recent Labs  Lab 07/18/18 1018 07/19/18 0412  AST 29 26  ALT 12 10  ALKPHOS 54 49  BILITOT 1.3* 1.0  PROT 7.7 7.1  ALBUMIN 2.8* 2.4*   No results for input(s): LIPASE, AMYLASE in the last 168 hours. No results for input(s): AMMONIA in the last 168 hours. Cardiac Enzymes: No results for input(s): CKTOTAL, CKMB, CKMBINDEX,  TROPONINI in the last 168 hours. BNP (last 3 results) No results for input(s): BNP in the last 8760  hours. CBG: Recent Labs  Lab 07/18/18 0937  GLUCAP 79   Time spent: 35 minutes  Signed:  Lynden Oxford  [facility]  07/19/2018  , 3:12 PM

## 2018-07-19 NOTE — Progress Notes (Signed)
Pt being discharged from hospital per orders from MD. Pt's daughter educated on discharge instructions. Daughter verbalized understanding of instructions. All questions and concerns were addressed. Pt's IV was removed prior to discharge. Pt exited hospital via stretcher accompanied by transport. Daughter notified of transport.

## 2018-07-21 DIAGNOSIS — M6281 Muscle weakness (generalized): Secondary | ICD-10-CM | POA: Diagnosis not present

## 2018-07-21 DIAGNOSIS — R269 Unspecified abnormalities of gait and mobility: Secondary | ICD-10-CM | POA: Diagnosis not present

## 2018-07-23 LAB — CULTURE, BLOOD (ROUTINE X 2)
CULTURE: NO GROWTH
Culture: NO GROWTH
Special Requests: ADEQUATE

## 2018-07-25 ENCOUNTER — Ambulatory Visit: Payer: Medicare HMO | Admitting: Podiatry

## 2018-07-29 DEATH — deceased

## 2018-08-20 DIAGNOSIS — M6281 Muscle weakness (generalized): Secondary | ICD-10-CM | POA: Diagnosis not present

## 2018-08-20 DIAGNOSIS — R269 Unspecified abnormalities of gait and mobility: Secondary | ICD-10-CM | POA: Diagnosis not present

## 2019-01-10 IMAGING — DX DG HIP (WITH OR WITHOUT PELVIS) 2-3V*R*
3 series · 3 of 3 positions shown · non-contrast
Comparison: 03/16/2018.

CLINICAL DATA: Right back bruising. Multiple falls.

EXAM:
DG HIP (WITH OR WITHOUT PELVIS) 2-3V RIGHT

[t pelvis ap]
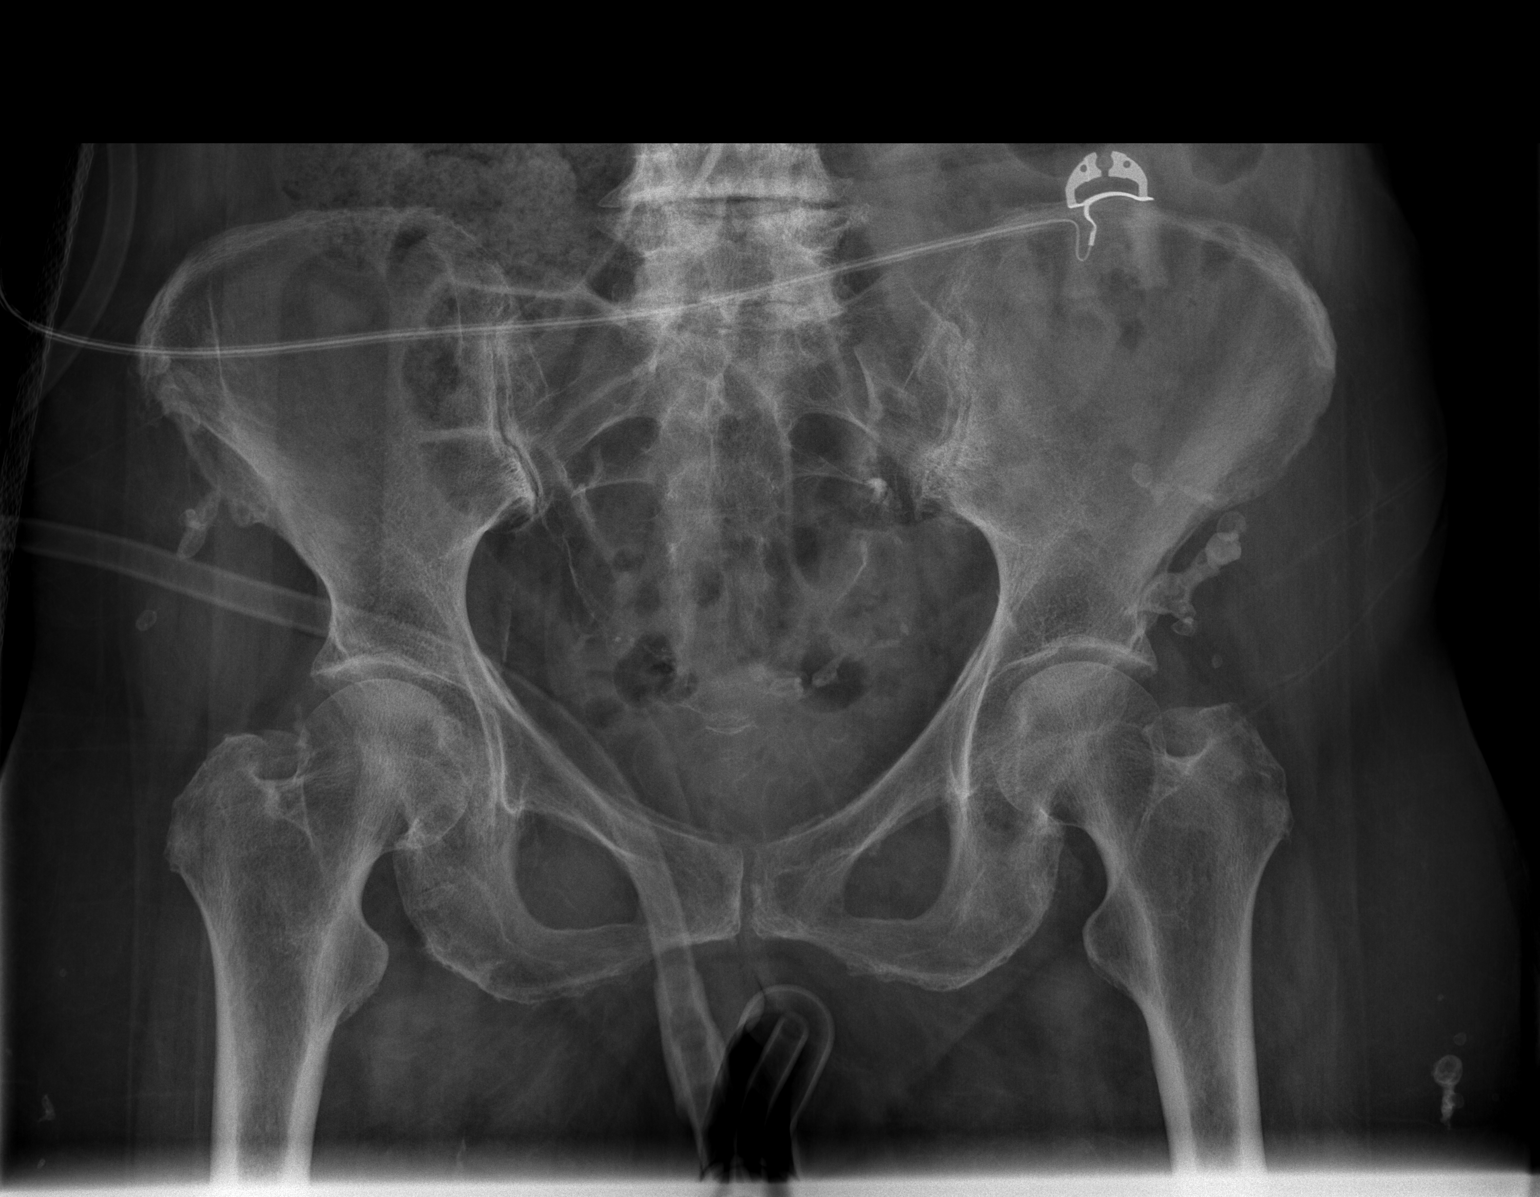

[t hip ap right]
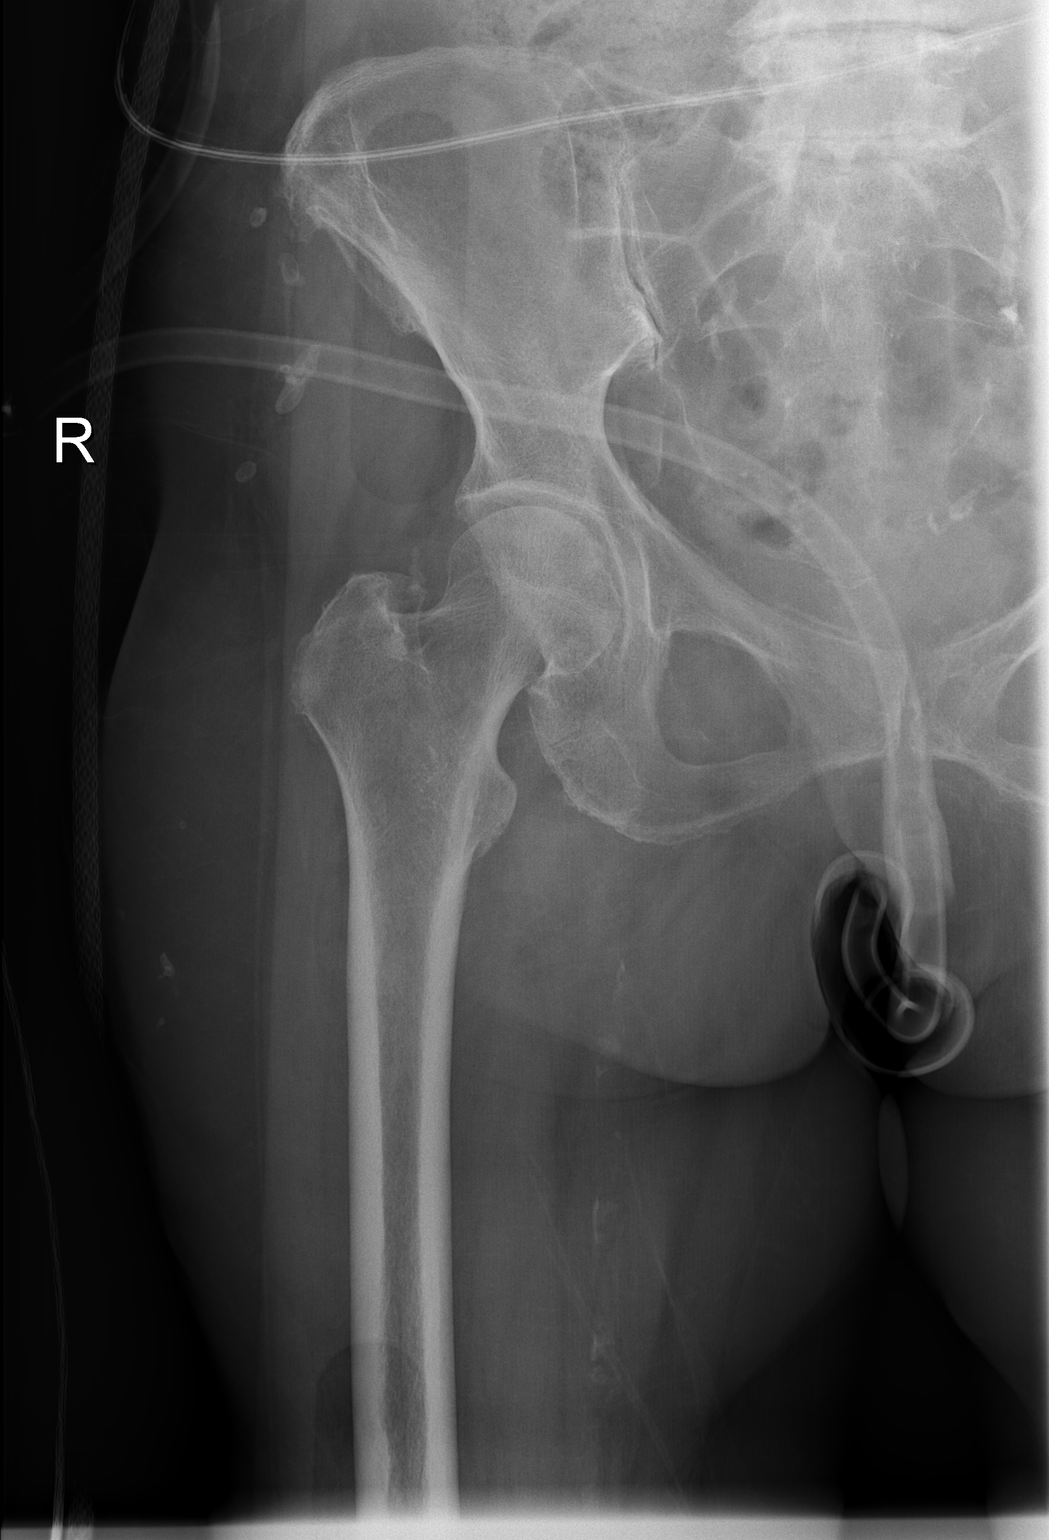

[t hip frog leg right]
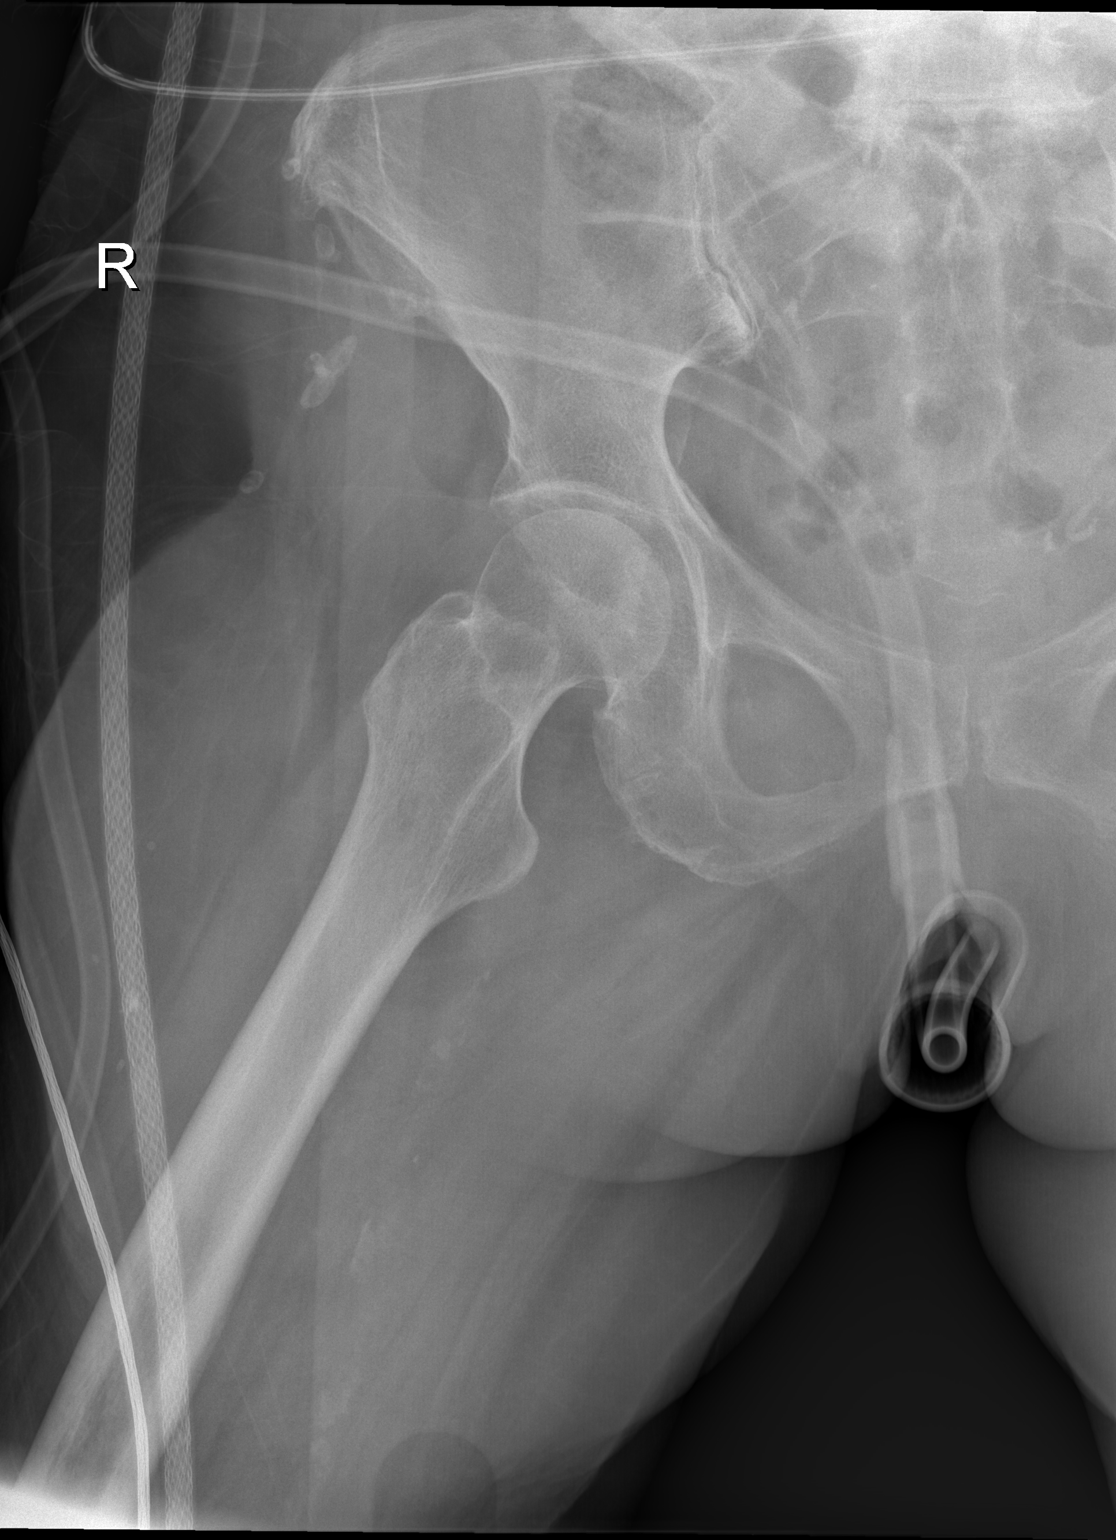

[3 of 3 positions shown; findings below may reference images not displayed]

FINDINGS: Normal appearing right hip without fracture or dislocation. Lower
lumbar spine degenerative changes. Atheromatous arterial
calcifications. Rectal catheter.
IMPRESSION: No fracture or dislocation.

## 2019-04-22 IMAGING — MR MR MRA NECK W/O CM
10 of 18 series · 23 of 48 positions shown · IV contrast (agent unspecified)
Comparison: CT head earlier today.  CTA head neck 07/04/2017.

CLINICAL DATA: Stroke follow-up. Suspected stroke. Alzheimer's
dementia.

Tachycardia.  Atrial fibrillation.  Lethargy.
EXAM:
MR HEAD WITHOUT CONTRAST
MR CIRCLE OF WILLIS WITHOUT CONTRAST
MRA OF THE NECK WITHOUT  CONTRAST
TECHNIQUE: Multiplanar, multiecho pulse sequences of the brain, circle of
willis and surrounding structures were obtained without intravenous
contrast. Angiographic images of the neck were obtained using MRA
technique without intravenous contrast.
CONTRAST:  None.

[Series 3: DWI · axial · 3.0mm · 1.09mm/px · z∈[-32,+104]mm · 3 of 96 slices shown (1 of 4)]
[im 1/96]
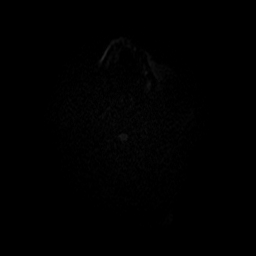
[im 48/96]
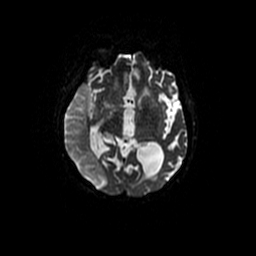
[im 96/96]
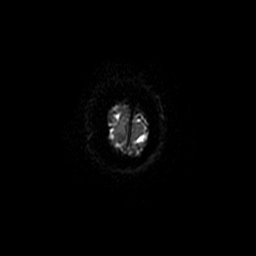

[Series 4: T1 · sagittal · 5.0mm · 0.47mm/px · 1 of 25 slices shown]
[im 1/25]
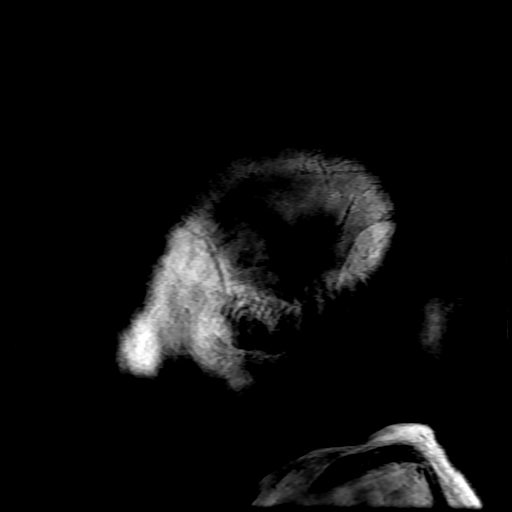

[Series 5: DWI · coronal · 5.0mm · 1.09mm/px · 2 of 68 slices shown (2 of 4)]
[im 1/68]
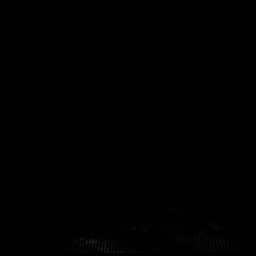
[im 68/68]
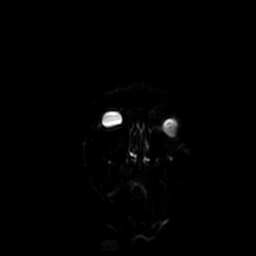

[Series 6: (id) mt fs · axial · 1.4mm · 0.43mm/px · z∈[-23,+72]mm · 6 of 146 slices shown]
[im 1/146]
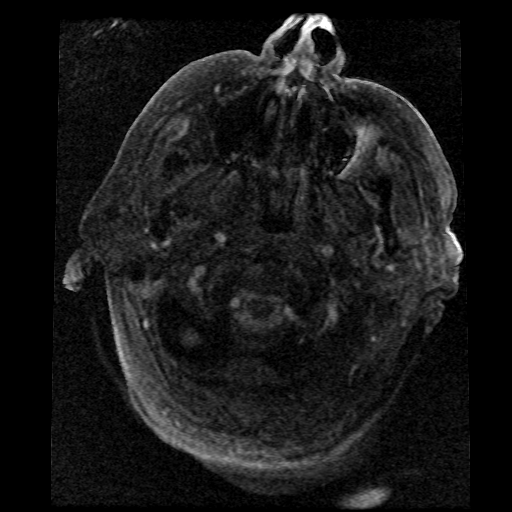
[im 30/146]
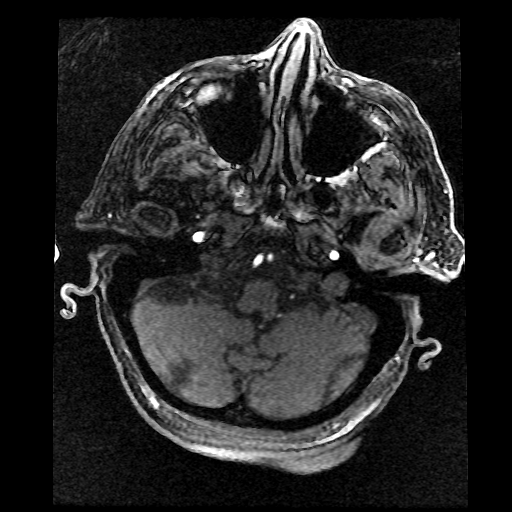
[im 59/146]
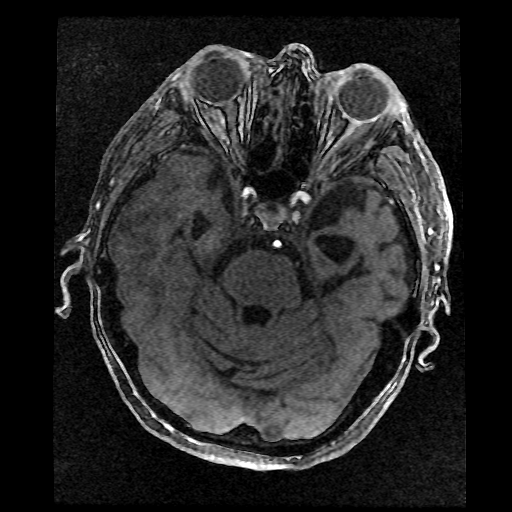
[im 88/146]
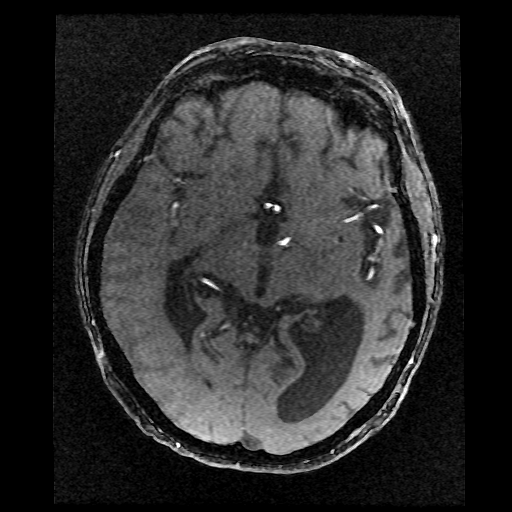
[im 117/146]
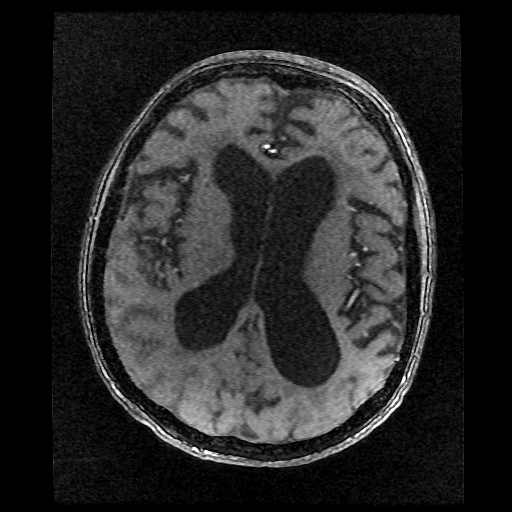
[im 146/146]
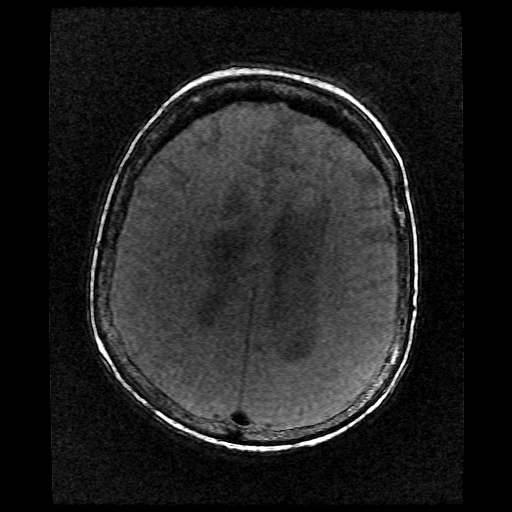

[Series 7: FLAIR · axial · 5.0mm · 0.43mm/px · 1 of 25 slices shown]
[im 1/25]
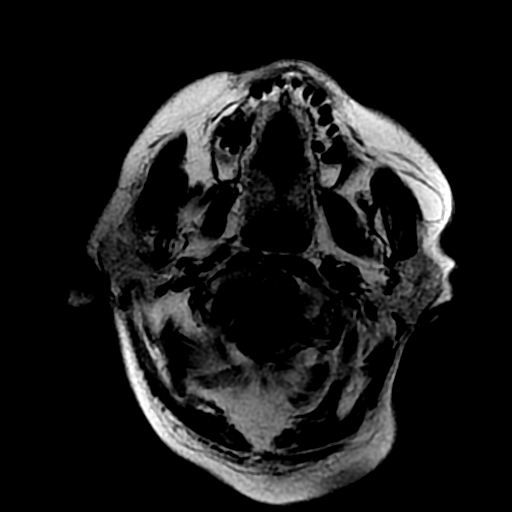

[Series 10: T2 · axial · 5.0mm · 0.43mm/px · 1 of 25 slices shown (1 of 2)]
[im 1/25]
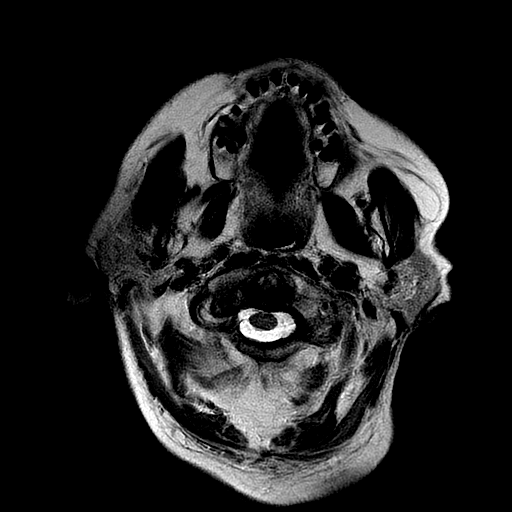

[Series 12: opt sag inhance · sagittal · 1.6mm · 0.66mm/px · 5 of 400 slices shown]
[im 25/400]
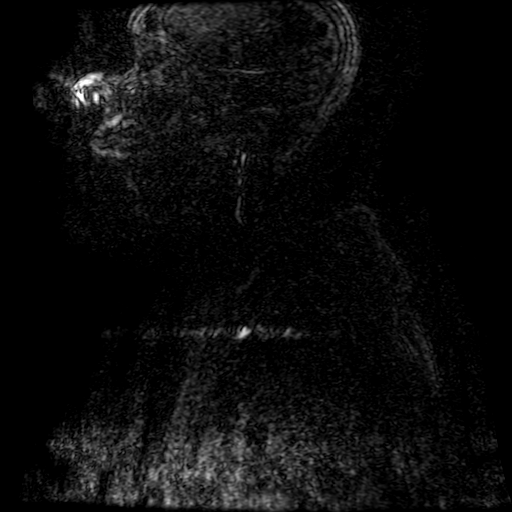
[im 75/400]
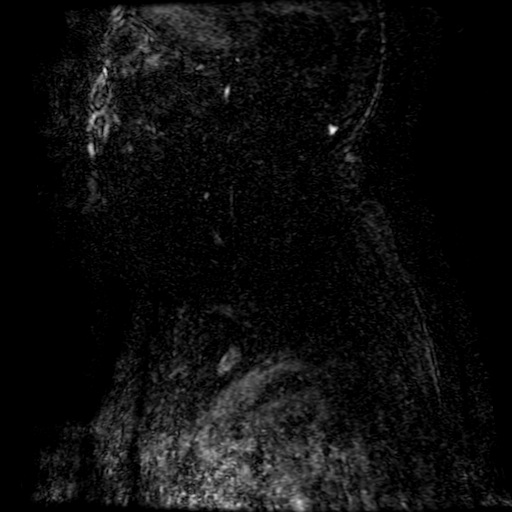
[im 125/400]
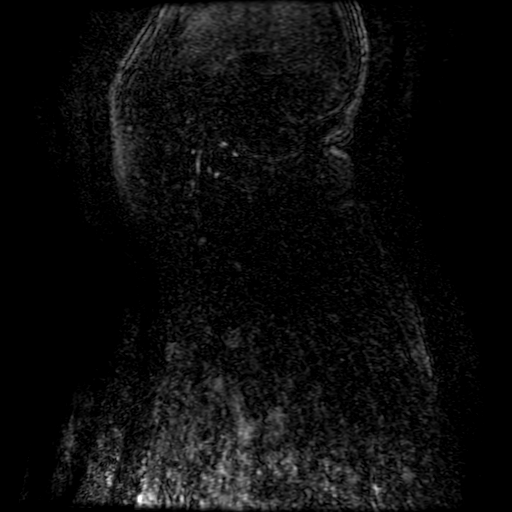
[im 175/400]
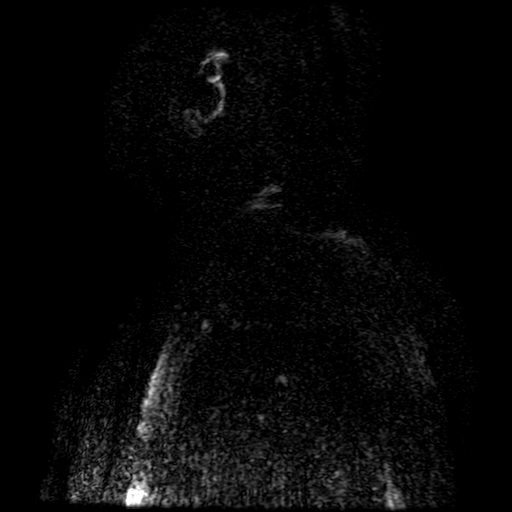
[im 225/400]
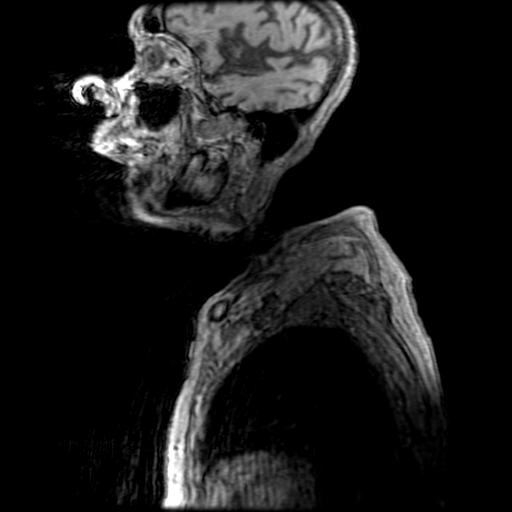

[Series 15: T2 · coronal · 5.0mm · 0.39mm/px · 1 of 27 slices shown (2 of 2)]
[im 1/27]
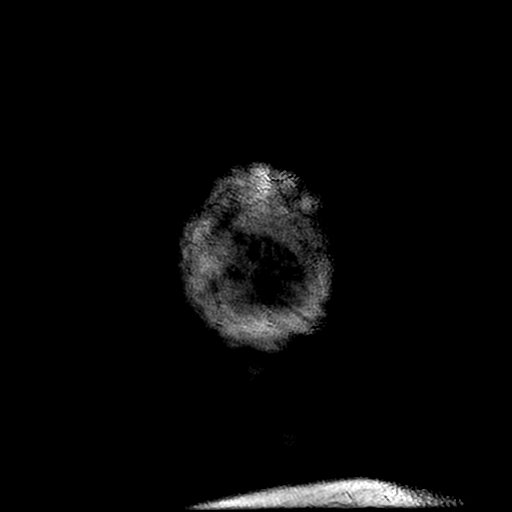

[Series 300: DWI · axial · 3.0mm · 1.09mm/px · z∈[-32,+104]mm · 2 of 48 slices shown (3 of 4)]
[im 1/48]
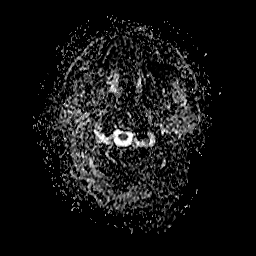
[im 48/48]
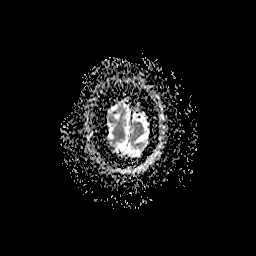

[Series 500: DWI · coronal · 5.0mm · 1.09mm/px · 1 of 34 slices shown (4 of 4)]
[im 1/34]
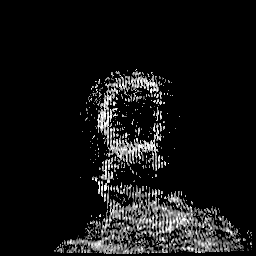

[23 of 48 positions shown; findings below may reference images not displayed]

FINDINGS: MR HEAD FINDINGS

Brain: Large area of restricted diffusion affecting much of the
RIGHT temporal lobe, inferior portion of the insula, RIGHT occipital
lobe, and RIGHT parietal lobe as well as underlying white matter
consistent with acute infarction. No visible hemorrhage. Slight mass
effect on the uninvolved brain, but no significant midline shift.

Vascular: Reported separately.

Skull and upper cervical spine: Slight motion degradation. Grossly
negative.

Sinuses/Orbits: No significant sinus opacity. BILATERAL cataract
extraction.

Other: None.

MR CIRCLE OF WILLIS FINDINGS

The internal carotid arteries are widely patent. Both anterior
cerebral arteries are patent, slight irregularity of the RIGHT A1,
supplying widely patent A2 and A3 segments bilaterally.

LEFT MCA M1 segment widely patent. Normal appearing MCA bifurcation,
with filling of the M3 branches.

Widely patent RIGHT M1 MCA. At M2 bifurcation, there is significant
irregularity of the superior M2 division, poor flow related
enhancement into the sub served M3 branches. The inferior M2 MCA
division is not visualized due to lack of flow related enhancement,
presumably due to an embolus.

The basilar artery is widely patent with vertebrals codominant. Both
posterior cerebral arteries fill in a normal fashion. There is no
definite cerebellar branch occlusion. No saccular aneurysm is
observed.

MRA NECK FINDINGS

Noncontrast MRA of the extracranial circulation is of relatively
poor quality. Great vessel origins are not visualized.

No visible RIGHT common carotid artery lesions, although there is
poor signal throughout the proximal, mid, and distal segments. At
the RIGHT carotid bifurcation, there is a suspected stenosis
estimated 50% of the proximal RIGHT internal carotid artery 1 cm
above bifurcation. The RIGHT external carotid artery appears
diffusely diseased.

No visible LEFT common carotid artery lesions, although tortuosity
results in signal dropout in its proximal and mid portions. At the
LEFT carotid bifurcation, there appears to be a possible
flow-limiting stenosis, estimated 75%, involving the proximal LEFT
ICA just above the bifurcation. The LEFT external carotid artery
appears diffusely diseased.

Both vertebral arteries are patent, RIGHT dominant. Ostial narrowing
is not assessed.
IMPRESSION: Large acute RIGHT MCA territory infarct, involving much of the RIGHT
temporal lobe, lateral occipital lobe, and inferior parietal lobe.
No hemorrhage.

No significant flow related enhancement within the inferior RIGHT M2
MCA division. Severely diseased superior RIGHT M2 MCA and subserving
branches. No intracranial RIGHT ICA or M1 MCA stenosis.

Poor quality extracranial MRA reveals BILATERAL carotid bifurcation
disease, worse on the LEFT with patent vertebral arteries, RIGHT
dominant. The quality of this noncontrast MRA neck is relatively
poor, and if additional information is needed, CTA head neck is
recommended for further evaluation.
